# Patient Record
Sex: Male | Born: 1950 | Race: White | Hispanic: No | Marital: Married | Smoking: Former smoker
Health system: Southern US, Community
[De-identification: ages and names within clinical notes are randomized; demographics above are authoritative.]

## PROBLEM LIST (undated history)

## (undated) DIAGNOSIS — M1990 Unspecified osteoarthritis, unspecified site: Secondary | ICD-10-CM

## (undated) DIAGNOSIS — F419 Anxiety disorder, unspecified: Secondary | ICD-10-CM

## (undated) DIAGNOSIS — I4510 Unspecified right bundle-branch block: Secondary | ICD-10-CM

## (undated) DIAGNOSIS — I10 Essential (primary) hypertension: Secondary | ICD-10-CM

## (undated) DIAGNOSIS — S065X9A Traumatic subdural hemorrhage with loss of consciousness of unspecified duration, initial encounter: Secondary | ICD-10-CM

## (undated) DIAGNOSIS — J189 Pneumonia, unspecified organism: Secondary | ICD-10-CM

## (undated) DIAGNOSIS — S065XAA Traumatic subdural hemorrhage with loss of consciousness status unknown, initial encounter: Secondary | ICD-10-CM

## (undated) DIAGNOSIS — D689 Coagulation defect, unspecified: Secondary | ICD-10-CM

## (undated) DIAGNOSIS — I739 Peripheral vascular disease, unspecified: Secondary | ICD-10-CM

## (undated) DIAGNOSIS — G709 Myoneural disorder, unspecified: Secondary | ICD-10-CM

## (undated) DIAGNOSIS — K5790 Diverticulosis of intestine, part unspecified, without perforation or abscess without bleeding: Secondary | ICD-10-CM

## (undated) DIAGNOSIS — E785 Hyperlipidemia, unspecified: Secondary | ICD-10-CM

## (undated) DIAGNOSIS — G4730 Sleep apnea, unspecified: Secondary | ICD-10-CM

## (undated) DIAGNOSIS — K5792 Diverticulitis of intestine, part unspecified, without perforation or abscess without bleeding: Secondary | ICD-10-CM

## (undated) DIAGNOSIS — T7840XA Allergy, unspecified, initial encounter: Secondary | ICD-10-CM

## (undated) DIAGNOSIS — H269 Unspecified cataract: Secondary | ICD-10-CM

## (undated) HISTORY — PX: CATARACT EXTRACTION, BILATERAL: SHX1313

## (undated) HISTORY — DX: Allergy, unspecified, initial encounter: T78.40XA

## (undated) HISTORY — DX: Hyperlipidemia, unspecified: E78.5

## (undated) HISTORY — DX: Anxiety disorder, unspecified: F41.9

## (undated) HISTORY — DX: Unspecified osteoarthritis, unspecified site: M19.90

## (undated) HISTORY — DX: Coagulation defect, unspecified: D68.9

## (undated) HISTORY — PX: CYSTOSCOPY WITH INSERTION OF UROLIFT: SHX6678

## (undated) HISTORY — DX: Diverticulosis of intestine, part unspecified, without perforation or abscess without bleeding: K57.90

## (undated) HISTORY — PX: TONSILLECTOMY: SUR1361

## (undated) HISTORY — PX: DG THUMB LEFT HAND: HXRAD1658

## (undated) HISTORY — DX: Unspecified cataract: H26.9

---

## 1971-02-28 DIAGNOSIS — K759 Inflammatory liver disease, unspecified: Secondary | ICD-10-CM

## 1971-02-28 HISTORY — DX: Inflammatory liver disease, unspecified: K75.9

## 1999-05-06 ENCOUNTER — Ambulatory Visit (HOSPITAL_COMMUNITY): Admission: RE | Admit: 1999-05-06 | Discharge: 1999-05-06 | Payer: Self-pay | Admitting: Endocrinology

## 1999-05-06 ENCOUNTER — Encounter: Payer: Self-pay | Admitting: Endocrinology

## 2001-05-22 ENCOUNTER — Encounter: Payer: Self-pay | Admitting: Emergency Medicine

## 2001-05-22 ENCOUNTER — Emergency Department (HOSPITAL_COMMUNITY): Admission: EM | Admit: 2001-05-22 | Discharge: 2001-05-22 | Payer: Self-pay | Admitting: Emergency Medicine

## 2004-02-28 DIAGNOSIS — I739 Peripheral vascular disease, unspecified: Secondary | ICD-10-CM

## 2004-02-28 DIAGNOSIS — J189 Pneumonia, unspecified organism: Secondary | ICD-10-CM

## 2004-02-28 HISTORY — DX: Pneumonia, unspecified organism: J18.9

## 2004-02-28 HISTORY — DX: Peripheral vascular disease, unspecified: I73.9

## 2004-04-30 ENCOUNTER — Inpatient Hospital Stay (HOSPITAL_COMMUNITY): Admission: EM | Admit: 2004-04-30 | Discharge: 2004-05-05 | Payer: Self-pay | Admitting: Emergency Medicine

## 2004-05-02 ENCOUNTER — Encounter (INDEPENDENT_AMBULATORY_CARE_PROVIDER_SITE_OTHER): Payer: Self-pay | Admitting: *Deleted

## 2004-06-17 ENCOUNTER — Inpatient Hospital Stay (HOSPITAL_COMMUNITY): Admission: EM | Admit: 2004-06-17 | Discharge: 2004-06-18 | Payer: Self-pay | Admitting: Emergency Medicine

## 2005-02-27 DIAGNOSIS — I82409 Acute embolism and thrombosis of unspecified deep veins of unspecified lower extremity: Secondary | ICD-10-CM

## 2005-02-27 HISTORY — DX: Acute embolism and thrombosis of unspecified deep veins of unspecified lower extremity: I82.409

## 2007-09-24 ENCOUNTER — Emergency Department (HOSPITAL_COMMUNITY): Admission: EM | Admit: 2007-09-24 | Discharge: 2007-09-24 | Payer: Self-pay | Admitting: Emergency Medicine

## 2008-02-28 HISTORY — PX: BRAIN SURGERY: SHX531

## 2008-07-25 ENCOUNTER — Inpatient Hospital Stay (HOSPITAL_COMMUNITY): Admission: EM | Admit: 2008-07-25 | Discharge: 2008-07-30 | Payer: Self-pay | Admitting: Emergency Medicine

## 2008-07-25 ENCOUNTER — Ambulatory Visit: Payer: Self-pay | Admitting: Cardiology

## 2008-07-28 ENCOUNTER — Encounter: Payer: Self-pay | Admitting: Cardiovascular Disease

## 2008-07-28 ENCOUNTER — Encounter (INDEPENDENT_AMBULATORY_CARE_PROVIDER_SITE_OTHER): Payer: Self-pay | Admitting: Internal Medicine

## 2008-08-18 ENCOUNTER — Emergency Department (HOSPITAL_COMMUNITY): Admission: EM | Admit: 2008-08-18 | Discharge: 2008-08-19 | Payer: Self-pay | Admitting: Emergency Medicine

## 2009-02-13 ENCOUNTER — Emergency Department (HOSPITAL_COMMUNITY): Admission: EM | Admit: 2009-02-13 | Discharge: 2009-02-14 | Payer: Self-pay | Admitting: Emergency Medicine

## 2009-09-06 ENCOUNTER — Telehealth (INDEPENDENT_AMBULATORY_CARE_PROVIDER_SITE_OTHER): Payer: Self-pay | Admitting: *Deleted

## 2010-03-29 NOTE — Progress Notes (Signed)
  Request Recieved from DDS sent to [facility]  September 06, 2009 11:22 AM

## 2010-05-30 LAB — RAPID URINE DRUG SCREEN, HOSP PERFORMED
Amphetamines: NOT DETECTED
Barbiturates: NOT DETECTED
Benzodiazepines: NOT DETECTED
Cocaine: NOT DETECTED
Opiates: POSITIVE — AB
Tetrahydrocannabinol: NOT DETECTED

## 2010-05-30 LAB — URINALYSIS, ROUTINE W REFLEX MICROSCOPIC
Bilirubin Urine: NEGATIVE
Glucose, UA: NEGATIVE mg/dL
Hgb urine dipstick: NEGATIVE
Ketones, ur: NEGATIVE mg/dL
Nitrite: NEGATIVE
Protein, ur: NEGATIVE mg/dL
Specific Gravity, Urine: 1.015 (ref 1.005–1.030)
Urobilinogen, UA: 1 mg/dL (ref 0.0–1.0)
pH: 6 (ref 5.0–8.0)

## 2010-05-30 LAB — GLUCOSE, RANDOM: Glucose, Bld: 254 mg/dL — ABNORMAL HIGH (ref 70–99)

## 2010-06-06 LAB — DIFFERENTIAL
Eosinophils Relative: 0 % (ref 0–5)
Lymphocytes Relative: 25 % (ref 12–46)
Neutro Abs: 7.2 10*3/uL (ref 1.7–7.7)
Neutrophils Relative %: 67 % (ref 43–77)

## 2010-06-06 LAB — POCT CARDIAC MARKERS
Myoglobin, poc: 44.7 ng/mL (ref 12–200)
Troponin i, poc: <0.05 ng/mL (ref 0.00–0.09)
Troponin i, poc: <0.05 ng/mL (ref 0.00–0.09)

## 2010-06-06 LAB — GLUCOSE, CAPILLARY
Glucose-Capillary: 199 mg/dL — ABNORMAL HIGH (ref 70–99)
Glucose-Capillary: 157 mg/dL — ABNORMAL HIGH (ref 70–99)
Glucose-Capillary: 169 mg/dL — ABNORMAL HIGH (ref 70–99)
Glucose-Capillary: 172 mg/dL — ABNORMAL HIGH (ref 70–99)
Glucose-Capillary: 175 mg/dL — ABNORMAL HIGH (ref 70–99)
Glucose-Capillary: 180 mg/dL — ABNORMAL HIGH (ref 70–99)
Glucose-Capillary: 77 mg/dL (ref 70–99)

## 2010-06-06 LAB — CBC
Hemoglobin: 15.2 g/dL (ref 13.0–17.0)
MCHC: 33.8 g/dL (ref 30.0–36.0)
MCV: 83.5 fL (ref 78.0–100.0)
MCV: 83.1 fL (ref 78.0–100.0)
RBC: 5.31 MIL/uL (ref 4.22–5.81)
RBC: 5.4 MIL/uL (ref 4.22–5.81)
RDW: 15.4 % (ref 11.5–15.5)
RDW: 14.5 % (ref 11.5–15.5)
WBC: 10.7 10*3/uL — ABNORMAL HIGH (ref 4.0–10.5)
WBC: 9.4 10*3/uL (ref 4.0–10.5)

## 2010-06-06 LAB — BRAIN NATRIURETIC PEPTIDE: Pro B Natriuretic peptide (BNP): <30 pg/mL (ref 0.0–100.0)

## 2010-06-06 LAB — BASIC METABOLIC PANEL
BUN: 12 mg/dL (ref 6–23)
Chloride: 99 mEq/L (ref 96–112)
Creatinine, Ser: 0.89 mg/dL (ref 0.4–1.5)
GFR calc Af Amer: >60 mL/min (ref >60)
GFR calc non Af Amer: >60 mL/min (ref >60)
Glucose, Bld: 114 mg/dL — ABNORMAL HIGH (ref 70–99)
Sodium: 140 mEq/L (ref 135–145)

## 2010-06-06 LAB — APTT: aPTT: 30 seconds (ref 24–37)

## 2010-06-06 LAB — D-DIMER, QUANTITATIVE: D-Dimer, Quant: 0.46 ug/mL-FEU (ref 0.00–0.48)

## 2010-06-06 LAB — POCT I-STAT, CHEM 8
Glucose, Bld: 208 mg/dL — ABNORMAL HIGH (ref 70–99)
HCT: 46 % (ref 39.0–52.0)
Sodium: 138 mEq/L (ref 135–145)

## 2010-06-06 LAB — CK TOTAL AND CKMB (NOT AT ARMC)
CK, MB: 1.3 ng/mL (ref 0.3–4.0)
Relative Index: INVALID (ref 0.0–2.5)
Total CK: 95 U/L (ref 7–232)

## 2010-06-06 LAB — PROTIME-INR
INR: 1 (ref 0.00–1.49)
Prothrombin Time: 13.7 seconds (ref 11.6–15.2)

## 2010-06-07 LAB — DIFFERENTIAL
Basophils Absolute: 0.1 10*3/uL (ref 0.0–0.1)
Basophils Relative: 1 % (ref 0–1)
Eosinophils Absolute: 0 10*3/uL (ref 0.0–0.7)
Eosinophils Relative: 0 % (ref 0–5)
Monocytes Absolute: 0.8 10*3/uL (ref 0.1–1.0)
Monocytes Relative: 9 % (ref 3–12)

## 2010-06-07 LAB — URINALYSIS, ROUTINE W REFLEX MICROSCOPIC
Bilirubin Urine: NEGATIVE
Glucose, UA: NEGATIVE mg/dL
Hgb urine dipstick: NEGATIVE
Ketones, ur: NEGATIVE mg/dL
Nitrite: NEGATIVE
Protein, ur: NEGATIVE mg/dL
Protein, ur: NEGATIVE mg/dL
Specific Gravity, Urine: 1.015 (ref 1.005–1.030)
Urobilinogen, UA: 0.2 mg/dL (ref 0.0–1.0)
Urobilinogen, UA: 0.2 mg/dL (ref 0.0–1.0)

## 2010-06-07 LAB — BASIC METABOLIC PANEL
BUN: 9 mg/dL (ref 6–23)
CO2: 28 mEq/L (ref 19–32)
Calcium: 9.3 mg/dL (ref 8.4–10.5)
Chloride: 100 mEq/L (ref 96–112)
GFR calc Af Amer: >60 mL/min (ref >60)
GFR calc non Af Amer: >60 mL/min (ref >60)
GFR calc non Af Amer: >60 mL/min (ref >60)
Glucose, Bld: 121 mg/dL — ABNORMAL HIGH (ref 70–99)
Potassium: 3.8 mEq/L (ref 3.5–5.1)
Potassium: 4 mEq/L (ref 3.5–5.1)
Sodium: 138 mEq/L (ref 135–145)
Sodium: 140 mEq/L (ref 135–145)

## 2010-06-07 LAB — CBC
HCT: 44.1 % (ref 39.0–52.0)
HCT: 44.9 % (ref 39.0–52.0)
Hemoglobin: 15 g/dL (ref 13.0–17.0)
Hemoglobin: 15.1 g/dL (ref 13.0–17.0)
Hemoglobin: 15.1 g/dL (ref 13.0–17.0)
Platelets: 294 10*3/uL (ref 150–400)
RBC: 5.38 MIL/uL (ref 4.22–5.81)
RDW: 14.5 % (ref 11.5–15.5)
RDW: 15 % (ref 11.5–15.5)
WBC: 12.2 10*3/uL — ABNORMAL HIGH (ref 4.0–10.5)
WBC: 9.1 10*3/uL (ref 4.0–10.5)

## 2010-06-07 LAB — URINE CULTURE: Culture: NO GROWTH

## 2010-06-07 LAB — GLUCOSE, CAPILLARY
Glucose-Capillary: 100 mg/dL — ABNORMAL HIGH (ref 70–99)
Glucose-Capillary: 130 mg/dL — ABNORMAL HIGH (ref 70–99)
Glucose-Capillary: 130 mg/dL — ABNORMAL HIGH (ref 70–99)
Glucose-Capillary: 150 mg/dL — ABNORMAL HIGH (ref 70–99)
Glucose-Capillary: 171 mg/dL — ABNORMAL HIGH (ref 70–99)
Glucose-Capillary: 176 mg/dL — ABNORMAL HIGH (ref 70–99)
Glucose-Capillary: 192 mg/dL — ABNORMAL HIGH (ref 70–99)
Glucose-Capillary: 197 mg/dL — ABNORMAL HIGH (ref 70–99)

## 2010-06-07 LAB — LIPID PANEL
HDL: 25 mg/dL — ABNORMAL LOW (ref >39)
LDL Cholesterol: 111 mg/dL — ABNORMAL HIGH (ref 0–99)
Total CHOL/HDL Ratio: 6.6 RATIO
Triglycerides: 142 mg/dL (ref <150)
VLDL: 28 mg/dL (ref 0–40)

## 2010-06-07 LAB — COMPREHENSIVE METABOLIC PANEL
ALT: 48 U/L (ref 0–53)
Albumin: 3.6 g/dL (ref 3.5–5.2)
Alkaline Phosphatase: 52 U/L (ref 39–117)
Chloride: 104 mEq/L (ref 96–112)
Potassium: 4.5 mEq/L (ref 3.5–5.1)
Sodium: 138 mEq/L (ref 135–145)
Total Bilirubin: 0.7 mg/dL (ref 0.3–1.2)
Total Protein: 6.9 g/dL (ref 6.0–8.3)

## 2010-06-07 LAB — HEMOGLOBIN A1C
Hgb A1c MFr Bld: 6.3 % — ABNORMAL HIGH (ref 4.6–6.1)
Mean Plasma Glucose: 134 mg/dL

## 2010-06-07 LAB — POCT CARDIAC MARKERS
CKMB, poc: 1 ng/mL (ref 1.0–8.0)
CKMB, poc: <1 ng/mL — ABNORMAL LOW (ref 1.0–8.0)
Myoglobin, poc: 71 ng/mL (ref 12–200)
Myoglobin, poc: 86.1 ng/mL (ref 12–200)
Troponin i, poc: <0.05 ng/mL (ref 0.00–0.09)
Troponin i, poc: <0.05 ng/mL (ref 0.00–0.09)

## 2010-06-07 LAB — CARDIAC PANEL(CRET KIN+CKTOT+MB+TROPI)
Relative Index: INVALID (ref 0.0–2.5)
Total CK: 93 U/L (ref 7–232)

## 2010-06-07 LAB — CK TOTAL AND CKMB (NOT AT ARMC)
CK, MB: 1.7 ng/mL (ref 0.3–4.0)
CK, MB: 1.8 ng/mL (ref 0.3–4.0)
Relative Index: 1.5 (ref 0.0–2.5)
Relative Index: 1.5 (ref 0.0–2.5)
Total CK: 104 U/L (ref 7–232)
Total CK: 111 U/L (ref 7–232)

## 2010-06-07 LAB — TSH
TSH: 1.518 u[IU]/mL (ref 0.350–4.500)
TSH: 1.929 u[IU]/mL (ref 0.350–4.500)

## 2010-06-07 LAB — TROPONIN I: Troponin I: 0.02 ng/mL (ref 0.00–0.06)

## 2010-07-12 NOTE — Consult Note (Signed)
NAME:  [name], [name] NO.:  [id number]   MEDICAL RECORD NO.:  [id number]          PATIENT TYPE:  INP   LOCATION:  2906                         FACILITY:  [facility]   PHYSICIAN:  Gerrit Friends. Dietrich Pates, MD, FACCDATE OF BIRTH:  [birth year]   DATE OF CONSULTATION:  07/26/2008  DATE OF DISCHARGE:                                 CONSULTATION   HISTORY OF PRESENT ILLNESS:  A 60 year old gentleman with multiple  cardiovascular risk factors presented to hospital with chest discomfort  with ischemic quality.  [name] has had long-standing hypertension and  diabetes.  The latter has recently improved with substantial weight  loss.  The former has been kept in good control.  He has had recent  illnesses including hematuria thought to represent prostatitis or  cystitis and treated with a course of antibiotics and left lower  quadrant pain thought to represent diverticulitis, and treated with a  second course of antibiotics.  He had taken the latter medications for 1  week when he noted the sudden onset of sharp moderate left subscapular  pain that eventually became moderate anterior chest pressure.  There was  associated dyspnea, but no diaphoresis nor nausea.  He had paresthesias  in both arms.  He developed mild lightheadedness as well.  He has no  history of anxiety nor of hyperventilation.  He has had some perhaps  similar symptoms, but nothing is impressive.  He phoned a Nurse Line in  the [place] system and was advised to seek care in the emergency department  and to be transported by ambulance.  His symptoms may have been present  for an hour or two prior to presenting to the ED at [facility].  He was  given sublingual nitroglycerin with substantial improvement in his  symptoms.  He had some waxing and waning discomfort over the subsequent  24-36 hours, but has been well today.  Initial EKG was absolutely  normal.  Cardiac markers have been normal.  A CT scan of the chest was  unremarkable.  These images were reviewed with the radiologist.  He has  mild coronary calcification and very mild calcification of the thoracic  aorta.   PAST MEDICAL HISTORY:  Otherwise notable for a prior episode of DVT.  He  has been overweight for some time, but not to the extent it was in the  past.  He has a 40 pack-year history of cigarette smoking that was  discontinued 6 months ago.  He underwent repair of a ligament in 2002.  He has had a number of serious episodes of apparent allergy with  swelling of the hands and face, some airway compromise and pruritus.  Specific allergen has not been identified, although the patient suspects  that it could be peanuts.  He is aware of allergies to Vicodin,  simvastatin, atorvastatin and apparently other statins.   SOCIAL HISTORY:  Prior excessive use of alcohol, none for quite a few  years.  Married and lives locally; 3 children are alive and well.   FAMILY HISTORY:  Father died after CVA with a history of coronary  disease.  Mother had hypertension.   REVIEW OF SYSTEMS:  Notable for he has intermittent constipation.  He  experienced symptoms compatible with GERD many years ago, when he was  using alcohol to excess and was obese.  He has not had that symptoms in  years.  He follows a regular diet.  He requires corrective lenses for  near and far vision.  He has a history of depression.  All other systems  reviewed and are negative.   PHYSICAL EXAMINATION:  GENERAL:  Pleasant gentleman in no acute  distress.  VITAL SIGNS:  The heart rate is 90 and regular, blood pressure 120/60,  respirations 14 and unlabored, and temperature 98.7.  HEENT:  Anicteric  sclerae; normal lids, and conjunctivae; normal EOMs.  NECK:  No jugular venous distention; normal carotid upstrokes without  bruits.  ENDOCRINE:  No thyromegaly.  HEMATOPOIETIC:  No adenopathy.  LUNGS:  Clear.  CARDIAC:  Normal first and second heart sounds; modest basilar systolic   ejection murmur.  ABDOMEN:  Soft and nontender; normal bowel sounds; no organomegaly.  EXTREMITIES:  Normal posterior tibial pulses; no edema.  NEUROLOGIC:  Symmetric strength and tone; normal cranial nerves.  SKIN:  Some loss of hair over the lower extremities; no significant  lesions.  PSYCHIATRIC:  Alert and oriented; normal affect.  NEUROLOGIC:  Symmetric strength and tone; normal cranial nerves.   EKG:  Normal sinus rhythm; within normal limits.   CHEST X-RAY:  Unremarkable.  Lipid profile shows a total cholesterol of  164, triglycerides of 142, HDL 25, and LDL of 111.  Metabolic profile,  TSH, and CBC are normal.  Hemoglobin A1c is 6.3.  Cardiac markers have  been negative.  BNP level was normal.  D-dimer was 0.51.   IMPRESSION:  [name] has multiple cardiovascular risk factors and an  impressive presentation in terms of the nature of his symptoms, but  negative course including normal cardiac markers and normal EKGs.  He  has some coronary calcification, but this is probably less than would be  expected for a 60 year old diabetic with hypertension and a history of  tobacco use.  The likelihood that his current symptoms represent an  acute coronary syndrome is well less than 50%.  We will proceed with a  stress nuclear study in the morning.  Ideally, he should be treated with  statins, but apparently has had adverse reactions to these medications.  We will try Niaspan.  We appreciate the request for consultation and  will be happy to follow this nice gentleman with you.      Gerrit Friends. Dietrich Pates, MD, [facility]  Electronically Signed     RMR/MEDQ  D:  07/26/2008  T:  07/27/2008  Job:  [id number]

## 2010-07-12 NOTE — Discharge Summary (Signed)
NAME:  [name], [name] NO.:  [id number]   MEDICAL RECORD NO.:  [id number]          PATIENT TYPE:  INP   LOCATION:  3739                         FACILITY:  [facility]   PHYSICIAN:  Ruthy Dick, MD    DATE OF BIRTH:  [birth year]   DATE OF ADMISSION:  07/24/2008  DATE OF DISCHARGE:  07/30/2008                               DISCHARGE SUMMARY   REASON FOR ADMISSION:  Chest pain.   FINAL DISCHARGE DIAGNOSES:  1. Atypical chest pain.  2. Nonobstructive coronary artery disease.  3. Type 2 diabetes mellitus.  4. Hypertension.  5. Dyslipidemia.  6. Morbid obesity.  7. Prior history of tobacco abuse.  8. Leukocytosis, resolved.  9. History of deep venous thrombosis.  10.Benign prostatic hyperplasia.   CONSULT DURING THIS ADMISSION:  Cardiology consult.   PROCEDURES DONE DURING THIS ADMISSION:  1. A 2-D echocardiogram which was read as having 55% to 65% ejection      fraction with no wall motion abnormalities.  2. Stress Cardiolite study which was read as having a small inferior      wall infarct from his chest base and normal ejection fraction of      61%.  3. CT angiogram of the chest which showed no evidence of pulmonary      embolism and no active chest disease.  4. Left heart catheterization because of abnormal stress test.  This      was read as having nonobstructive coronary artery disease with      normal ejection fraction and the recommendation was for the patient      have risk factor modifications and started on standard medications.   BRIEF HISTORY OF PRESENT ILLNESS AND HOSPITAL COURSE:  This is a 60-year-  old morbidly obese Caucasian male with a past medical history is  significant for hypertension, diabetes mellitus who came into the  hospital with chest tightness.  Because of his numerous risk factors, he  was admitted and ruled out for acute coronary artery syndrome with  serial enzymes and EKG.  A stress Cardiolite study, however, showed that  the  patient probably has a small inferior wall infarction.  Because of  this, a left heart catheterization was ordered and the results as noted  above, did not show any new occlusive coronary disease.  The patient did  have 40% occlusion in some of his vessels, but again no occlusive  coronary artery disease.  Because of this, the cardiologist, Dr.  Clifton James recommended that the patient should be on aggressive risk  factor modifications including ACE inhibitor, statin, aspirin, and beta-  blocker.  He is to continue quitting tobacco abuse.  Of note, it is the  fact that the patient has had intolerance to statin and side effects to  statin as well.  He has also had some intolerance to ACE inhibitors.  I  discussed this at length with the patient and Dr. Clifton James also  discussed this at length with him and according to him, he was willing  to try this two groups of medications again and if there has been any  side effects again, we will give him advise to stop taking them  altogether, again this is a trial on his part and he is willing to try  since there are benefits for him to be on these medications considering  his situation.  We planned to start on low-dose of this two medications.  Today, he has no complaints whatsoever.  He says his chest pain has  resolved.  No abdominal pain, no nausea, no vomiting, no diarrhea, no  constipation, no dysuria, no frequency, no syncope.  He is agitating to  go home today.   PHYSICAL EXAMINATION:  VITAL SIGNS:  Temperature 97.5, pulse 78,  respiration 22, blood pressure 135/70 and saturating 98% on room air.  CHEST:  Clear to auscultation bilaterally.  ABDOMEN:  Soft, nontender.  EXTREMITIES: No clubbing, no cyanosis, no edema.  CARDIOVASCULAR:  First and second heart sounds only.  CENTRAL NERVOUS SYSTEM:  Nonfocal.   The patient is to follow up with Dr. Juleen China who is actually his  endocrinologist, but also sees to primary care physician according to   this patient.  He is to see Dr. Juleen China in about 2 weeks.  He is also to  follow up with Dr. Clifton James in about 3 weeks and Dr. Gibson Ramp phone  number has been provided to the patient to call.  He is to call Dr.  Juleen China also for his appointment.   DISCHARGE MEDICATIONS:  1. Lantus 40 units subcu daily.  2. NovoLog insulin 10 units subcu b.i.d.  3. Terazosin 10 mg subcu daily.  4. Ambien 10 mg at bedtime p.r.n. for insomnia.  5. Aspirin 325 mg p.o. daily.  6. Over-the-counter vitamin D3 one tablet daily.  7. Fish oil 1000 mg subcu daily.  8. Men's Health multivitamins one tablet daily.  9. Metoprolol 25 mg p.o. b.i.d.  10.Crestor 10 mg p.o. daily.  11.Lisinopril 5 mg p.o. daily.  The patient is to stop taking both      Crestor and lisinopril if the patient develops side effects and      this side effects has been explained to the patient in details and      he knows them very well.  He has been on this medications in the      past.   Time spent for discharge planning is greater than 30 minutes.      Ruthy Dick, MD  Electronically Signed     GU/MEDQ  D:  07/30/2008  T:  07/31/2008  Job:  [id number]   cc:   Brooke Bonito, M.D.  Verne Carrow, MD

## 2010-07-12 NOTE — H&P (Signed)
NAME:  [name], [name] NO.:  [id number]   MEDICAL RECORD NO.:  [id number]          PATIENT TYPE:  INP   LOCATION:  1826                         FACILITY:  [facility]   PHYSICIAN:  Della Goo, M.D. DATE OF BIRTH:  1950-07-15   DATE OF ADMISSION:  07/24/2008  DATE OF DISCHARGE:                              HISTORY & PHYSICAL   PRIMARY CARE PHYSICIAN:  Unassigned.   ENDOCRINOLOGIST:  Brooke Bonito, M.D.   CHIEF COMPLAINT:  Chest pain.   HISTORY OF PRESENT ILLNESS:  This is a 61 year old male who presented to  the emergency department via ambulance secondary to chest pain,  shortness of breath and chest tightness that started at about 3 p.m..  He stated the pain was radiating into the left chest and into both arms.  He describes the discomfort as being a heaviness and numbness.  He  states that both arms became tingly and he felt lightheaded and short of  breath.  He denies having any diaphoresis.  He states that he was at  work when the discomfort occurred and he reports leaving work to go  home.  He felt that his blood sugar may have been high, so he checked  his blood sugar and found it to be 157.  He stated that he then thought  he may have been having an allergic reaction to the antibiotics that he  has been taking for the past week for diverticulitis.  He is taking both  Flagyl and Bactrim therapy.  He stated at home he then called the [facility] in [place] Help Nurse, who advised him to go to the  hospital for evaluation and so he went to the Urgent Care Center.  At  the Urgent Care Center, he was seen and taken to the emergency  department for urgent workup.  The patient states that at home he had  taken 2 aspirin.  He was administered 2 sublingual nitroglycerin  initially which relieved the pain slightly but the pain returned again  at 9 p.m.  The patient was then placed on IV nitroglycerin drip.   PAST MEDICAL HISTORY:  1. Hypertension.  2.  Diabetes.  3. Recent diverticulitis.  4. History of previous deep venous thrombosis.  5. Morbid obesity.  6. Tobacco abuse history.   PAST SURGICAL HISTORY:  History of a left ligament repair in 2002.   MEDICATIONS:  Include Benadryl Cipro, Bactrim.  The patient was on  Hyzaar but he stopped taking this medication.  He is on Lantus insulin  40 units subcu q.h.s. and NovoLog insulin 10 units twice a day with  meals.   ALLERGIES:  VICODIN and the patient also reports having an intolerance  to ZOCOR, LIPITOR and STATIN medications which cause diaphragmatic  discomfort, muscle pain, and urinary incontinence.   SOCIAL HISTORY:  The patient is a previous smoker, he reports quitting 6  months ago.  He is a nondrinker and no history of illicit drug usage.   FAMILY HISTORY:  Positive for coronary artery disease in his father who  died of a cerebrovascular accident.  Positive for hypertension in his  mother.  No history of diabetes in the family other than him and no  history of cancer in the family that he knows of.   REVIEW OF SYSTEMS:  Pertinents are mentioned above.  The patient denies  having any nausea, vomiting, diarrhea, constipation, dysuria, syncope,  seizures, fevers, chills, weight loss, arthralgias or myalgias.   PHYSICAL EXAMINATION:  FINDINGS:  This is a 60 year old morbidly obese  male in no visible discomfort or acute distress, currently on an IV  nitroglycerin drip at this time.  VITAL SIGNS:  Temperature 98.0, blood pressure now 118/78, heart rate  85, respirations 18, O2 sat is 99% on 2 liters nasal cannula oxygen.  HEENT: Examination normocephalic, atraumatic.  There is no scleral  icterus.  Pupils equally round, reactive to light.  Extraocular  movements are intact.  Funduscopic benign.  There is no scleral icterus.  Nares are patent bilaterally.  Tympanic membranes clear bilaterally.  Oropharynx is clear.  NECK:  Supple full range of motion.  No thyromegaly,  adenopathy, jugular  venous distention.  CARDIOVASCULAR: Regular rate and rhythm.  No murmurs, gallops or rubs.  LUNGS: Clear to auscultation bilaterally.  No rales, rhonchi or wheezes.  ABDOMEN:  Positive bowel sounds, soft, nontender, nondistended.  EXTREMITIES: Without cyanosis, clubbing or edema.  NEUROLOGIC EXAMINATION:  The patient is alert and oriented x3.  Cranial  nerves are intact.  Motor and sensory function also intact.   LABORATORY STUDIES:  White blood cell count 9.1, hemoglobin 15.0,  hematocrit 44.1, MCV 83.0, platelets 294, neutrophils 62% lymphocytes  29%.  Sodium 138, potassium 4.5, chloride 104, carbon dioxide 30, BUN 9,  creatinine 0.80, glucose 109, albumin 3.6, AST 29, ALT 48.  Urinalysis  negative.  Pro time 14.4, INR 1.1, PTT 29.  Point of care cardiac  markers with a myoglobin of 71.0, CK-MB less than 1.0, troponin less  than 0.05.  Beta natriuretic peptide less than 30.0.  Cardiac enzymes  total CK 132, CK-MB 1.8, relative index 1.4, troponin 0.01.  Chest x-ray  reveals no acute cardiopulmonary process and chronic bronchitic  markings.  EKG reveals normal sinus rhythm without acute ST-segment  changes.   ASSESSMENT:  A 60 year old male being admitted with:  1. Chest pain.  2. Type 2 diabetes mellitus.  3. Hypertension.  4. Hyperlipidemia.  5. Morbid obesity.  6. Previous tobacco history.   PLAN:  The patient will be admitted to the step-down ICU area and will  remain on the IV nitroglycerin drip.  The patient has been administered  aspirin therapy, oxygen therapy and will be placed on DVT and GI  prophylaxis.  His  regular medications will be further verified and sliding scale insulin  coverage will be ordered for elevated blood sugars.  Further cardiac  evaluation will be rendered pending the patient's clinical course and  results of his clinical studies.      Della Goo, M.D.  Electronically Signed     HJ/MEDQ  D:  07/25/2008  T:   07/25/2008  Job:  [id number]

## 2010-07-12 NOTE — Cardiovascular Report (Signed)
NAME:  [name], [name] NO.:  [id number]   MEDICAL RECORD NO.:  [id number]           PATIENT TYPE:   LOCATION:                                 FACILITY:   PHYSICIAN:  Verne Carrow, MDDATE OF BIRTH:  01-28-1951   DATE OF PROCEDURE:  07/29/2008  DATE OF DISCHARGE:                            CARDIAC CATHETERIZATION   PROCEDURES PERFORMED:  1. Left heart catheterization.  2. Selective coronary angiography.  3. Left ventricular angiogram.   OPERATOR:  Verne Carrow, MD   INDICATION:  Chest pain in a 60 year old patient with history of  hypertension and diabetes mellitus.  The patient underwent an exercise  myocardial perfusion study that showed inferior wall infarction but no  active ischemia.   DETAILS OF THE PROCEDURE:  The patient was brought to the inpatient  cardiac catheterization laboratory after signing informed consent for  the procedure.  The right groin was prepped and draped in a sterile  fashion.  A 1% lidocaine was used for local anesthesia.  A 5-French  sheath was inserted into the right femoral artery without difficulty.  Standard diagnostic catheters were used to perform selective coronary  angiography.  A pigtail catheter was used to perform a left ventricular  angiogram.  There was no significant gradient noted across the aortic  valve on pullback of the catheter.  The patient tolerated the procedure  well and was taken to the holding area in stable condition.   HEMODYNAMIC FINDINGS:  Central aortic pressure 146/77.  Left ventricular  pressure 131/11.  Left ventricular end-diastolic pressure 13.   ANGIOGRAPHIC FINDINGS:  1. The left main coronary artery had no obstructive disease.  2. The left anterior descending is a large vessel that courses to the      apex and gives off a moderate to large-sized diagonal branch.  The      midportion of the LAD has a long tubular 30% stenosis.  The distal      portion of the LAD has mild  plaque disease.  There are no flow-      limiting lesions within the left anterior descending artery.  The      diagonal branch is a moderate to large-sized vessel that has a 40%      stenosis.  3. The circumflex artery gives off 2 small to moderate-sized obtuse      marginal branches.  There appears to be a 30% stenosis in the      midportion of the circumflex but no severely obstructive lesions.  4. The right coronary artery is a large dominant vessel that has mild      luminal irregularities only in the mid and distal vessel.  The      posterior descending artery is a small-caliber vessel that has a      40% stenosis.  5. Left ventricular angiogram shows normal left ventricular systolic      function with no wall motion abnormalities.  Ejection fraction was      estimated at 50-55%.   IMPRESSION:  1. Nonobstructive coronary artery disease.  2. Normal left ventricular systolic function.  3. Noncardiac etiology of chest pain.   RECOMMENDATIONS:  I recommend an aggressive risk factor modification in  this patient who is found to have mild nonobstructive coronary artery  disease.  I would suggest adding a statin medication, but the patient  has been intolerant to STATIN medications in the past secondary to  muscle pain and diaphragmatic discomfort.  I agree with the addition of  a beta-blocker.  The patient is in agreement that he should continue  with his complete tobacco cessation.  He can be discharged today after 3  hours of bedrest if he clinically does well.  This will be around 5:30  p.m.  He can follow up in my office in 2 weeks.  The number for  appointment has been included in the paper medical record.      Verne Carrow, MD  Electronically Signed     CM/MEDQ  D:  07/29/2008  T:  07/30/2008  Job:  [id number]

## 2010-07-15 NOTE — H&P (Signed)
NAME:  [name], [name]               ACCOUNT NO.:  [id number]   MEDICAL RECORD NO.:  [id number]          PATIENT TYPE:  INP   LOCATION:  0101                         FACILITY:  [facility]   PHYSICIAN:  Lonia Blood, M.D.      DATE OF BIRTH:  05-09-1950   DATE OF ADMISSION:  06/17/2004  DATE OF DISCHARGE:                                HISTORY & PHYSICAL   PRIMARY CARE PHYSICIAN:  Dr. Juleen China.   PRESENTING COMPLAINT:  Left lower extremity swelling and pain.   HISTORY OF PRESENT ILLNESS:  This is a 60 year old white male with history  of diabetes type 2 and recent pneumonia that was recently seen at an outside  Hospital and diagnosed with left lower extremity DVT. The patient was sent  home on Lovenox and warfarin. He went in today to see Dr. Juleen China, his primary  care physician, who was not comfortable with the way the patient was  presenting. The patient complained of some chest pain, apparently even on  the day he was diagnosed with the DVT. Hence, he is being sent here for  further evaluation. The patient currently denied any chest pain but has  noticed increasing swelling in his left lower extremity. He is also worried  about using the Lovenox at home with the Coumadin. He denied any fever,  cough, shortness of breath.   PAST MEDICAL HISTORY:  1.  Diabetes type 2.  2.  Hypertension, currently controlled off medications.  3.  Morbid obesity.  4.  Recent pneumonia.   MEDICATIONS:  Mainly insulin at this time - Lantus 50 units daily and  sliding scale insulin with NovoLog. The patient quit taking all other  medications. Also takes Vicodin for pain and warfarin since 2 days ago.   SOCIAL HISTORY:  The patient is married, lives here in [place], works for  [employer]. 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 He has remote history of tobacco smoking which he quit about 15 years  ago, currently smokes cigar on and off. Denied any drinking use or IV drug  use.   FAMILY HISTORY:  The patient denied any family history of vascular occlusive  disease. Positive for family history of diabetes.   REVIEW OF SYSTEMS:  A 10-point review of systems essentially negative except  for the HPI.   PHYSICAL EXAMINATION:  VITAL SIGNS:  Temperature is 99.5, blood pressure  136/82, pulse 95, respiratory rate 24. His saturation 97% on room air.  GENERAL:  The patient is morbidly obese but alert, oriented, and very  conversant.  HEENT:  PERRL, EOMI. His neck is supple. No JVD, no lymphadenopathy.  RESPIRATORY:  The patient has good air entry bilaterally. No wheezes or  rales.  CARDIOVASCULAR:  The patient has regular rate and rhythm.  ABDOMEN:  Obese, full, nontender, with positive bowel sounds.  EXTREMITIES:  Showed left lower extremity tight, swollen. Warm at the calf  but cold at the extremities. Bluish discoloration mildly of the left foot.   LABORATORY DATA:  White count is 9.3, hemoglobin 12.8, with an MCV of 79.3,  platelet count  529, with normal differentials. Sodium 132, potassium 4.1,  chloride 98, glucose 219, BUN 6, creatinine 0.8, calcium 9.8, total protein  7.4, albumin 2.9. LFTs normal. His PT is 13.1, INR 1.0, PTT 37.   Chest CT without contrast shows no evidence of PE but a 5 mm nodule in the  right upper lung, probably not new per patient, and recommended follow-up in  about 3 months.   ASSESSMENT:  This is a 60 year old obese gentleman presenting with left  lower extremity deep venous thrombosis but no evidence of pulmonary embolus.  The patient has failed literally home Lovenox therapy and is not  comfortable. His DVT seems to be probably brought on by recent prolonged  immobilization from hospitalization. Per the patient, however, he has since  resumed work which is very active. 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 The patient therefore may have had  some genetic causes as well. With acute  thromboembolic phenomenon, however,  will defer testing for genetic causes until the patient is fully treated.  Probably after 6 months will stop the Coumadin and check all the panels 2  weeks later. For now, however, the plan will be:  1.  Will admit the patient to a monitored bed.  2.  Start both Lovenox and Coumadin while the patient is in the hospital.      Elevate his foot and add warm compress.  3.  Continue to mange his diabetes with Lantus and sliding scale insulin and      make further adjustment as needed. The patient's previous hemoglobin A1c      was 11.8 in March so that indicated poor control.  4.  For his morbid obesity, the patient will be clearly counseled.  5.  History of hypertension. The patient has been off medications on his own      for a while. Will therefore monitor his blood pressure. If it goes up      will restart him on some antihypertensives.   Will endeavor to see the patient's INR climb steadily prior to discharge and  the target INR will be 2-3.      LG/MEDQ  D:  06/17/2004  T:  06/17/2004  Job:  [id number]

## 2010-07-15 NOTE — H&P (Signed)
NAME:  [name], [name]               ACCOUNT NO.:  [id number]   MEDICAL RECORD NO.:  [id number]          PATIENT TYPE:  INP   LOCATION:  0344                         FACILITY:  [facility]   PHYSICIAN:  Mobolaji B. Bakare, M.D.DATE OF BIRTH:  [birth year]   DATE OF ADMISSION:  04/30/2004  DATE OF DISCHARGE:                                HISTORY & PHYSICAL   PRIMARY CARE PHYSICIAN:  [name]   CHIEF COMPLAINT:  Shortness of breath for 10 days.   HISTORY OF PRESENT ILLNESS:  [name] is 60 year old Caucasian male with  history of diabetes mellitus, hypertension, hyperlipidemia. He was well  until about 10 days ago when he started experiencing shortness of breath at  rest and also on exertion. He started experiencing pleuritic chest pain  about 5 days ago. He denies cough. He had low grade fever of 99.7 at home.  As stated, he had some chills yesterday, which lasted about 1 hour. He did  not check his temperature at that time. He was seen in [name] office  last Wednesday, 3 days ago, and the patient was started on antibiotics,  Levaquin for pneumonia. However, he is not getting any better and he came to  the emergency department. Chest x-ray done was read as unilateral pulmonary  congestion, left sided. His BNP was less than 100. The patient does not have  orthopnea or PND, no lower extremity edema. He does have muscle aches and  headaches. He stated that his eldest daughter was also sick with muscle  aches and headaches.   REVIEW OF SYSTEMS:  He feels nauseous but no vomiting. No change in his  bowel habits. No dysuria. He has lost 20 pounds in the last 3 months  intentionally because he was on a vegetarian diet.   PAST MEDICAL HISTORY:  1.  Diabetes mellitus type 2, for which he is using oral hypoglycemic agent.      He stated that [name] is planning to put him on insulin.  2.  Hypertension. He was using Hyzaar before. However, he discontinued using      this because he  thought it was giving him leg cramps. But he later      realized that it could have been the Zocor, which he was using about the      same time.  3.  Hyperlipidemia. The patient has side effects from statins and currently      is not using any medication.   CURRENT MEDICATIONS:  1.  Glucophage XR 500 mg 4 tablets q.h.s.  2.  Amaryl 4 mg 1 tablet q.h.s.  3.  Recently started on Levaquin.   ALLERGIES:  The patient has side effects to ZOCOR.   SOCIAL HISTORY:  He smokes cigarettes 5 sticks per day and also smokes  cigars. He has used cocaine and weed in the past when he was much younger.  He occasionally drinks alcohol.   FAMILY HISTORY:  He is married, has 3 children. Father died at the age of 33  from CVA.   Initial vitals:  Blood pressure 185/104, later rechecked 151/35.  Temperature  98.8, pulse 110, respiratory rate of 22, O2 sat of 96% on 2 L.  On examination, the patient is not dyspneic, but slightly tachypneic.  Normocephalic, atraumatic head. Not pale, anicteric, no cervical  lymphadenopathy. No elevated JVD. Mucous membranes moist, no oral thrush, no  exudate. Lungs with clear breath sounds, no rhonchi, no wheeze. Rales on  left lung base. Chest wall, no remarkable tenderness on anterior chest wall.  Abdomen obese, soft, not tender, bowel sounds present, no holosystolic  murmur heard. Extremities, no pedal edema, no calf tenderness. Dorsalis  pedis pulses are palpable bilaterally. CNS, no focal neurological deficit.  Skin, no rash, no petechiae.  Initial laboratory data. White cell count 18.1, hemoglobin 15.0, hematocrit  38.6, MCV 81.6, RDW 14.6, platelets 501. Sodium 130, potassium 3.4, chloride  94, bicarbonate 27, glucose 273, BUN 8, creatinine 0.7, calcium 8.3. Cardiac  marker at the point of care. Myoglobin 135 normal, CK-MB normal, troponin  normal, d-dimer 2.17. BNP 95.   ASSESSMENT AND PLAN:  [name] is a pleasant 59 year old Caucasian male  with a history of  diabetes mellitus, hypertension, and hyperlipidemia. He is  presenting with shortness of breath at rest and on exertion without cough.  He has had low grade fever and some chills, pleuritic chest pain. He has an  elevated d-dimer white cell count and chest x-ray suggestive of unilateral  edema. Clinically, the patient does not appear to be in CHF. In addition,  his BNP is 95.  Problem 1. Shortness of breath:  We need to rule out pulmonary embolus.  Given the elevated white cell count and low grade fever with chills, will  treat for pneumonia with ceftriaxone 1 g IV q.d. and Zithromax 500 mg IV  q.d. Check serial cardiac enzymes. CT chest angiogram. Will check influenza  A and B antigen, nebulize with Atrovent and albuterol. Obtain 2-D  echocardiogram.  Problem 2. Diabetes mellitus:  Check hemoglobin A1C. Continue with  Glucophage XR 2 g p.o. q.h.s. and Amaryl 4 mg p.o. q.d. Use sliding scale  insulin in addition. The patient is open to starting Lantus if necessary.  Problem 3. Obesity  Problem 4. Hypertension:  We started Hyzaar 12.5/50 p.o. q. day.  Problem 5. Hyperlipidemia:  Check fasting lipid profile.  Problem 6. Hyponatremia:  The patient is asymptomatic probably from poor  p.o. intake.  Problem 7. Hypokalemia:  Will repeat with Kay Ciel 14 mcg  Problem 8. Tobacco abuse:  Obtain smoking cessation counseling.  Problem 9. DVT prophylaxis with Lovenox 40 mg subcu q. day.      MBB/MEDQ  D:  05/01/2004  T:  05/01/2004  Job:  [id number]

## 2010-07-15 NOTE — Discharge Summary (Signed)
[name], [name]               ACCOUNT NO.:  [id number]   MEDICAL RECORD NO.:  [id number]          PATIENT TYPE:  INP   LOCATION:  0344                         FACILITY:  [facility]   PHYSICIAN:  Hettie Holstein, D.O.    DATE OF BIRTH:  November 21, 1950   DATE OF ADMISSION:  04/30/2004  DATE OF DISCHARGE:  05/05/2004                                 DISCHARGE SUMMARY   PRIMARY CARE PHYSICIAN:  Brooke Bonito, M.D.   ADMISSION DIAGNOSES:  1.  Community-acquired pneumonia.  2.  Poorly controlled diabetes and hypertension.   DISCHARGE DIAGNOSES:  1.  Community-acquired pneumonia, status post course of two days of      inpatient intravenous antibiotics and clinical improvement.  Discharged      on Avelox to complete this course.  2.  Poorly controlled diabetes mellitus with hemoglobin A1C of 11.8 this      admission and initiation of Lantus insulin and diabetic education to be      set up in the outpatient setting.  3.  Poorly controlled hypertension with initiation of labetalol 100 mg twice      daily.  4.  Morbid obesity.  5.  Right upper quadrant pain, status post evaluation this hospitalization      with imaging studies, including an abdominal ultrasound, which revealed      hepatomegaly with diffuse fatty infiltration.  No evidence of      gallstones.  In addition, he underwent other studies, including CT      scanning of the chest that was negative for pulmonary embolus.  Air      space opacities were noted bilaterally.  There was small pleural      effusions.  A 2D echocardiogram report revealed an ejection fraction of      55-65%.  The study was limited due to his body habitus.   DISCHARGE MEDICATIONS:  1.  Aspirin 81 mg daily.  2.  Labetalol 100 mg twice daily.  3.  Hydrochlorothiazide 12.5 mg daily.  4.  Lantus 38 units each evening.  5.  Cozaar 100 mg daily.  6.  Avelox 100 mg daily until May 16, 2004.  7.  Senokot 1 twice daily with instructions to hold for diarrhea.  8.   NovoLog Pen 4 units before each meal.  Check a.m. blood sugars.  He was      instructed to check his a.m. blood sugars and check blood glucose with      symptoms of hypoglycemia.  9.  He was prescribed oxycodone 5-10 mg as needed for moderate-to-severe      pain.   He was instructed to not return to work until he was seen by his primary  care physician.  He was recommended to have a chest x-ray follow-up in two  weeks.   DISPOSITION:  He was set up with [facility] for home oxygen.  He was instructed to follow up with Dr. Juleen China on the week of discharge.   HISTORY OF PRESENT ILLNESS:  For full details, please refer to the dictated  H&P by Dr. Corky Downs;  however, briefly, this is a 60 year old male with a  history of diabetes, hypertension, hyperlipidemia, who presented with  shortness of breath and dyspnea on exertion and a low-grade temperature, as  well as pleuritic chest pain.  He had edema on his chest x-ray, and D-dimer  was elevated in the emergency department.   HOSPITAL COURSE:  He was admitted and treated with empiric coverage for  community-acquired pneumonia and underwent 2D echocardiogram as well as  laboratory studies that did reveal normal BNP and a normal 2D echocardiogram  as well as a normal LDL at 71.  He did have on his CT, patchy air space  disease.  He continued on antibiotics and responded clinically to therapy.  On CT scan of his chest, evaluation for PE was negative.  It was noted that  his liver was enlarged.  He had been complaining of right upper quadrant  pain, and he underwent ultrasound, which did not reveal significant  abnormality with the exception of fatty infiltration of his liver.  He did  improve and had some difficulty in managing his blood pressure.  He was  initiated on labetalol with adequate control.  Eventually, he did become  adequately controlled on Lantus with regard to his hyperglycemia.  In any  event, he is medically stable  for discharge and is being discharged in close  follow up with his primary care physician this week.      ESS/MEDQ  D:  05/30/2004  T:  05/30/2004  Job:  [id number]   cc:   Brooke Bonito, M.D.  [address]  [place]  [place] [address]  Fax: [phone]

## 2010-07-15 NOTE — Discharge Summary (Signed)
NAME:  [name], [name]               ACCOUNT NO.:  [id number]   MEDICAL RECORD NO.:  [id number]          PATIENT TYPE:  INP   LOCATION:  0358                         [facility]ACILITY[facility]   PHYSICIAN:  Gertha Calkin, M.D.DATE OF BIRTH:  1951-01-14   DATE OF ADMISSION:  06/17/2004  DATE OF DISCHARGE:  06/18/2004                                 DISCHARGE SUMMARY   PRIMARY CARE PHYSICIAN:  Brooke Bonito, M.D.   DISCHARGE DIAGNOSES:  1.  Left lower extremity deep venous thrombosis.  2.  Nodule, history of.  3.  Diabetes, type 2, uncontrolled.  4.  Morbid obesity.  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  6.  Recent pneumonia.   DISCHARGE MEDICATIONS:  1.  To resume home medications, Lantus 15 units daily.  2.  Sliding scale insulin as prior to admission.  3.  Vicodin one tablet p.o. q.6h. p.r.n.  4.  Coumadin as previously dosed.  5.  Lovenox 130 mg subcu injection b.i.d. until INR is therapeutic on      Coumadin.   FOLLOW UP:  The patient is to make an appointment with Dr. Juleen China to be seen  on Monday for lab draw for an INR check.   HOSPITAL COURSE:  Please see H&P for details of admission.  1.  __________ left leg DVT.  The patient was continued on Lovenox during      his hospitalization and his Warfarin.  The pharmacy was dosing the      Warfarin.  The patient was admitted because he had chest pain and CT      scan showed that there was no evidence of pulmonary embolus.  The      patient has been stable throughout, has no complaints of chest pain      during this overnight stay.  Left lower extremity swelling per patient      is actually decreasing slowly since the initiation of Lovenox and      Coumadin therapy from prior hospital discharge.  The patient feels      comfortable at this time to be sent home on Lovenox therapy and to      continue Coumadin therapy also at home and follow up with his primary      care physician.  Questions concerning ambulation and pain were answered  during this hospitalization.  Please see discharge medication list for      pain medication frequency.  2.  All other background medical problems had no changes or additions to his      medication regimen.   DISPOSITION:  Stable on discharge to be followed up in primary care  physician's office until INR is therapeutic from 2-3.      JD/MEDQ  D:  06/18/2004  T:  06/18/2004  Job:  [id number]   cc:   Brooke Bonito, M.D.  [address]  [place]  [place] [address]  Fax: [phone]

## 2010-11-25 LAB — BASIC METABOLIC PANEL
GFR calc Af Amer: >60
GFR calc non Af Amer: >60
Potassium: 4.4
Sodium: 134 — ABNORMAL LOW

## 2011-10-09 ENCOUNTER — Emergency Department (HOSPITAL_BASED_OUTPATIENT_CLINIC_OR_DEPARTMENT_OTHER)
Admission: EM | Admit: 2011-10-09 | Discharge: 2011-10-09 | Disposition: A | Discharge status: Home / Self Care | Payer: BC Managed Care – PPO | Attending: Emergency Medicine | Admitting: Emergency Medicine

## 2011-10-09 ENCOUNTER — Emergency Department (HOSPITAL_BASED_OUTPATIENT_CLINIC_OR_DEPARTMENT_OTHER): Payer: BC Managed Care – PPO

## 2011-10-09 ENCOUNTER — Encounter (HOSPITAL_BASED_OUTPATIENT_CLINIC_OR_DEPARTMENT_OTHER): Payer: Self-pay | Admitting: *Deleted

## 2011-10-09 DIAGNOSIS — K5730 Diverticulosis of large intestine without perforation or abscess without bleeding: Secondary | ICD-10-CM | POA: Insufficient documentation

## 2011-10-09 DIAGNOSIS — Z794 Long term (current) use of insulin: Secondary | ICD-10-CM | POA: Insufficient documentation

## 2011-10-09 DIAGNOSIS — I10 Essential (primary) hypertension: Secondary | ICD-10-CM | POA: Insufficient documentation

## 2011-10-09 DIAGNOSIS — E119 Type 2 diabetes mellitus without complications: Secondary | ICD-10-CM | POA: Insufficient documentation

## 2011-10-09 DIAGNOSIS — R197 Diarrhea, unspecified: Secondary | ICD-10-CM | POA: Insufficient documentation

## 2011-10-09 DIAGNOSIS — R109 Unspecified abdominal pain: Secondary | ICD-10-CM | POA: Insufficient documentation

## 2011-10-09 DIAGNOSIS — Z79899 Other long term (current) drug therapy: Secondary | ICD-10-CM | POA: Insufficient documentation

## 2011-10-09 DIAGNOSIS — K5792 Diverticulitis of intestine, part unspecified, without perforation or abscess without bleeding: Secondary | ICD-10-CM

## 2011-10-09 DIAGNOSIS — R110 Nausea: Secondary | ICD-10-CM | POA: Insufficient documentation

## 2011-10-09 HISTORY — DX: Traumatic subdural hemorrhage with loss of consciousness status unknown, initial encounter: S06.5XAA

## 2011-10-09 HISTORY — DX: Diverticulitis of intestine, part unspecified, without perforation or abscess without bleeding: K57.92

## 2011-10-09 HISTORY — DX: Essential (primary) hypertension: I10

## 2011-10-09 HISTORY — DX: Traumatic subdural hemorrhage with loss of consciousness of unspecified duration, initial encounter: S06.5X9A

## 2011-10-09 LAB — COMPREHENSIVE METABOLIC PANEL
ALT: 21 U/L (ref 0–53)
AST: 18 U/L (ref 0–37)
Albumin: 3.8 g/dL (ref 3.5–5.2)
CO2: 28 mEq/L (ref 19–32)
Calcium: 9.2 mg/dL (ref 8.4–10.5)
Chloride: 98 mEq/L (ref 96–112)
GFR calc non Af Amer: >90 mL/min (ref >90)
Sodium: 135 mEq/L (ref 135–145)

## 2011-10-09 LAB — CBC WITH DIFFERENTIAL/PLATELET
Basophils Absolute: 0 10*3/uL (ref 0.0–0.1)
Basophils Relative: 0 % (ref 0–1)
Eosinophils Relative: 2 % (ref 0–5)
Lymphocytes Relative: 25 % (ref 12–46)
Neutro Abs: 7.3 10*3/uL (ref 1.7–7.7)
Platelets: 283 10*3/uL (ref 150–400)
RDW: 14.4 % (ref 11.5–15.5)
WBC: 11.3 10*3/uL — ABNORMAL HIGH (ref 4.0–10.5)

## 2011-10-09 LAB — URINALYSIS, ROUTINE W REFLEX MICROSCOPIC
Leukocytes, UA: NEGATIVE
Nitrite: NEGATIVE
Specific Gravity, Urine: 1.029 (ref 1.005–1.030)
pH: 6 (ref 5.0–8.0)

## 2011-10-09 LAB — URINE MICROSCOPIC-ADD ON

## 2011-10-09 MED ORDER — PROMETHAZINE HCL 25 MG PO TABS: 25.0000 mg | ORAL_TABLET | Freq: Four times a day (QID) | ORAL | Status: DC | PRN

## 2011-10-09 MED ORDER — OXYCODONE-ACETAMINOPHEN 5-325 MG PO TABS: 2.0000 | ORAL_TABLET | ORAL | Status: AC | PRN

## 2011-10-09 MED ORDER — METRONIDAZOLE 500 MG PO TABS: 500.0000 mg | ORAL_TABLET | Freq: Two times a day (BID) | ORAL | Status: AC

## 2011-10-09 MED ORDER — METRONIDAZOLE 500 MG PO TABS: 500.0000 mg | ORAL_TABLET | Freq: Two times a day (BID) | ORAL | Status: DC

## 2011-10-09 MED ORDER — OXYCODONE-ACETAMINOPHEN 5-325 MG PO TABS: 2.0000 | ORAL_TABLET | ORAL | Status: DC | PRN

## 2011-10-09 MED ORDER — IOHEXOL 300 MG/ML  SOLN: 20.0000 mL | INTRAMUSCULAR | Status: AC

## 2011-10-09 MED ORDER — IOHEXOL 300 MG/ML  SOLN
100.0000 mL | Freq: Once | INTRAMUSCULAR | Status: AC | PRN
  Administered 2011-10-09: 100 mL via INTRAVENOUS

## 2011-10-09 MED ORDER — AMOXICILLIN-POT CLAVULANATE 875-125 MG PO TABS: 1.0000 | ORAL_TABLET | Freq: Two times a day (BID) | ORAL | Status: AC

## 2011-10-09 MED ORDER — CIPROFLOXACIN HCL 500 MG PO TABS: 500.0000 mg | ORAL_TABLET | Freq: Two times a day (BID) | ORAL | Status: AC

## 2011-10-09 NOTE — ED Provider Notes (Signed)
Medical screening examination/treatment/procedure(s) were performed by non-physician practitioner and as supervising physician I was immediately available for consultation/collaboration.   Kaylina Cahue B. Bernette Mayers, MD 10/09/11 [phone]

## 2011-10-09 NOTE — ED Notes (Signed)
Pt c/o abd pain  X 1 week . HX diverticulitis

## 2011-10-09 NOTE — ED Provider Notes (Signed)
History     CSN: [id number]  Arrival date & time 10/09/11  1546   First MD Initiated Contact with Patient 10/09/11 1805      Chief Complaint  Patient presents with  . Abdominal Pain    (Consider location/radiation/quality/duration/timing/severity/associated sxs/prior treatment) Patient is a 61 y.o. male presenting with abdominal pain. The history is provided by the patient. No language interpreter was used.  Abdominal Pain The primary symptoms of the illness include abdominal pain. The current episode started more than 2 days ago. The onset of the illness was gradual. The problem has been gradually worsening.  The illness is associated with a recent illness. The patient has had a change in bowel habit. Additional symptoms associated with the illness include chills. Significant associated medical issues include inflammatory bowel disease.  Pt has a history of diverticulitis.  Pt complains of left lower abdominal pain and pain in his right upper abdomen.    Past Medical History  Diagnosis Date  . Diverticulitis   . Diabetes mellitus   . Subdural hematoma   . Hypertension     Past Surgical History  Procedure Date  . Brain surgery   . Tonsillectomy     History reviewed. No pertinent family history.  History  Substance Use Topics  . Smoking status: Never Smoker   . Smokeless tobacco: Not on file  . Alcohol Use: No      Review of Systems  Constitutional: Positive for chills.  Gastrointestinal: Positive for abdominal pain.  All other systems reviewed and are negative.    Allergies  Statins and Avelox  Home Medications   Current Outpatient Rx  Name Route Sig Dispense Refill  . VITAMIN D3 3000 UNITS PO TABS Oral Take 1 tablet by mouth daily.    . INSULIN ASPART 100 UNIT/ML [place] SOLN Subcutaneous Inject 8 Units into the skin 3 (three) times daily before meals.    . INSULIN DETEMIR 100 UNIT/ML [place] SOLN Subcutaneous Inject 50 Units into the skin at bedtime.    Marland Kitchen  LISINOPRIL 20 MG PO TABS Oral Take 20 mg by mouth daily. Patient uses 10 mg of this medication.    . ONE-A-DAY MENS PO Oral Take 1 tablet by mouth daily.    Marland Kitchen FISH OIL PO Oral Take 1 capsule by mouth daily.    Marland Kitchen POTASSIUM CHLORIDE PO Oral Take 1 tablet by mouth daily.      BP 157/74  Pulse 79  Temp 98.4 F (36.9 C) (Oral)  Resp 16  Ht 6\' 2"  (1.88 m)  Wt 285 lb (129.275 kg)  BMI 36.59 kg/m2  SpO2 100%  Physical Exam  Nursing note and vitals reviewed. Constitutional: He is oriented to person, place, and time. He appears well-developed and well-nourished.  HENT:  Head: Normocephalic and atraumatic.  Eyes: Pupils are equal, round, and reactive to light.  Neck: Normal range of motion. Neck supple.  Cardiovascular: Normal rate and regular rhythm.   Pulmonary/Chest: Effort normal and breath sounds normal.  Abdominal: Soft. There is tenderness.  Musculoskeletal: Normal range of motion.  Neurological: He is alert and oriented to person, place, and time. He has normal reflexes.  Skin: Skin is warm.  Psychiatric: He has a normal mood and affect.    ED Course  Procedures (including critical care time)  Labs Reviewed  URINALYSIS, ROUTINE W REFLEX MICROSCOPIC - Abnormal; Notable for the following:    Glucose, UA >1000 (*)     All other components within normal limits  CBC  WITH DIFFERENTIAL - Abnormal; Notable for the following:    WBC 11.3 (*)     All other components within normal limits  COMPREHENSIVE METABOLIC PANEL - Abnormal; Notable for the following:    Glucose, Bld 265 (*)     Total Bilirubin 0.2 (*)     All other components within normal limits  LIPASE, BLOOD - Abnormal; Notable for the following:    Lipase 131 (*)     All other components within normal limits  URINE MICROSCOPIC-ADD ON   No results found.   No diagnosis found.    MDM  Pt given rx for flagyl, cipro, phenergan and percocet.   Pt advised to follow up with GI.  Pt also advised of chest findings and  followup       Elson Areas, [place] 10/09/11 2139

## 2012-02-28 HISTORY — PX: COLONOSCOPY: SHX174

## 2012-03-07 ENCOUNTER — Emergency Department (HOSPITAL_COMMUNITY)
Admission: EM | Admit: 2012-03-07 | Discharge: 2012-03-07 | Disposition: A | Discharge status: Home / Self Care | Payer: BC Managed Care – PPO | Attending: Emergency Medicine | Admitting: Emergency Medicine

## 2012-03-07 ENCOUNTER — Encounter (HOSPITAL_COMMUNITY): Payer: Self-pay | Admitting: Emergency Medicine

## 2012-03-07 DIAGNOSIS — I10 Essential (primary) hypertension: Secondary | ICD-10-CM | POA: Insufficient documentation

## 2012-03-07 DIAGNOSIS — Z8719 Personal history of other diseases of the digestive system: Secondary | ICD-10-CM | POA: Insufficient documentation

## 2012-03-07 DIAGNOSIS — Z79899 Other long term (current) drug therapy: Secondary | ICD-10-CM | POA: Insufficient documentation

## 2012-03-07 DIAGNOSIS — Z8669 Personal history of other diseases of the nervous system and sense organs: Secondary | ICD-10-CM | POA: Insufficient documentation

## 2012-03-07 DIAGNOSIS — E119 Type 2 diabetes mellitus without complications: Secondary | ICD-10-CM | POA: Insufficient documentation

## 2012-03-07 DIAGNOSIS — Z794 Long term (current) use of insulin: Secondary | ICD-10-CM | POA: Insufficient documentation

## 2012-03-07 DIAGNOSIS — K648 Other hemorrhoids: Secondary | ICD-10-CM | POA: Insufficient documentation

## 2012-03-07 LAB — COMPREHENSIVE METABOLIC PANEL
ALT: 25 U/L (ref 0–53)
AST: 19 U/L (ref 0–37)
Albumin: 3.7 g/dL (ref 3.5–5.2)
CO2: 28 mEq/L (ref 19–32)
Chloride: 100 mEq/L (ref 96–112)
Creatinine, Ser: 0.86 mg/dL (ref 0.50–1.35)
Potassium: 3.7 mEq/L (ref 3.5–5.1)
Sodium: 138 mEq/L (ref 135–145)
Total Bilirubin: 0.2 mg/dL — ABNORMAL LOW (ref 0.3–1.2)

## 2012-03-07 LAB — CBC WITH DIFFERENTIAL/PLATELET
Basophils Absolute: 0.1 10*3/uL (ref 0.0–0.1)
Basophils Relative: 1 % (ref 0–1)
Lymphocytes Relative: 19 % (ref 12–46)
MCHC: 33.3 g/dL (ref 30.0–36.0)
Monocytes Absolute: 0.8 10*3/uL (ref 0.1–1.0)
Neutro Abs: 8.5 10*3/uL — ABNORMAL HIGH (ref 1.7–7.7)
Neutrophils Relative %: 73 % (ref 43–77)
Platelets: 328 10*3/uL (ref 150–400)
RDW: 14.1 % (ref 11.5–15.5)
WBC: 11.8 10*3/uL — ABNORMAL HIGH (ref 4.0–10.5)

## 2012-03-07 LAB — ABO/RH: ABO/RH(D): O POS

## 2012-03-07 LAB — TYPE AND SCREEN: Antibody Screen: NEGATIVE

## 2012-03-07 MED ORDER — SODIUM CHLORIDE 0.9 % IV SOLN
INTRAVENOUS | Status: DC
  Administered 2012-03-07: 21:00:00 via INTRAVENOUS

## 2012-03-07 MED ORDER — SODIUM CHLORIDE 0.9 % IV BOLUS (SEPSIS)
500.0000 mL | Freq: Once | INTRAVENOUS | Status: AC
  Administered 2012-03-07: 500 mL via INTRAVENOUS

## 2012-03-07 NOTE — ED Notes (Signed)
Per patient, history of diverticulitis, loose stool with bright red blood-patient states large amount-slight dizziness

## 2012-03-07 NOTE — Discharge Instructions (Signed)
Soak in warm tub, twice a day. Drink a lot of fluids. See Dr. Madilyn Fireman, if not better in 3 or 4 days.   Hemorrhoid Banding Hemorrhoids are veins in the anus and lower rectum that become enlarged. The most common symptoms are rectal bleeding, itching, and sometimes pain. Hemorrhoids might come out with straining or having a bowel movement, and they can sometimes be pushed back in. There are internal and external hemorrhoids. Only internal hemorrhoids can be treated with banding. In this procedure, a rubber band is placed near the hemorrhoid tissue, cutting off the blood supply. This procedure prevents the hemorrhoids from slipping down. LET YOUR CAREGIVER KNOW ABOUT: All medicines you are taking, especially blood thinners such as aspirin and coumadin.  RISKS AND COMPLICATIONS This is not a painful procedure, but if you do have intense pain immediately let your surgeon know because the band may need to be removed. You may have some mild pain or discomfort in the first 2 days or so after treatment. Sometimes there may be delayed bleeding in the first week after treatment.  BEFORE THE PROCEDURE  There is no special preparation needed before banding. Your surgeon may have you do an enema prior to the procedure. You will go home the same day.  HOME CARE INSTRUCTIONS   Your surgeon might instruct you to do sitz baths as needed if you have discomfort or after a bowel movement.  You may be instructed to use fiber supplements. SEEK MEDICAL CARE IF:  You have an increase in pain.  Your pain does not get better. SEEK IMMEDIATE MEDICAL CARE IF:  You have intense pain.  Fever greater than 100.5 F (38.1 C).  Bleeding that does not stop, or pus from the anus. Document Released: 12/11/2008 Document Revised: 05/08/2011 Document Reviewed: 12/11/2008 [facility] Patient Information 2013 [place], [place]. Hemorrhoids Hemorrhoids are enlarged (dilated) veins around the rectum. There are 2 types of hemorrhoids,  and the type of hemorrhoid is determined by its location. Internal hemorrhoids occur in the veins just inside the rectum.They are usually not painful, but they may bleed.However, they may poke through to the outside and become irritated and painful. External hemorrhoids involve the veins outside the anus and can be felt as a painful swelling or hard lump near the anus.They are often itchy and may crack and bleed. Sometimes clots will form in the veins. This makes them swollen and painful. These are called thrombosed hemorrhoids. CAUSES Causes of hemorrhoids include:  Pregnancy. This increases the pressure in the hemorrhoidal veins.  Constipation.  Straining to have a bowel movement.  Obesity.  Heavy lifting or other activity that caused you to strain. TREATMENT Most of the time hemorrhoids improve in 1 to 2 weeks. However, if symptoms do not seem to be getting better or if you have a lot of rectal bleeding, your caregiver may perform a procedure to help make the hemorrhoids get smaller or remove them completely.Possible treatments include:  Rubber band ligation. A rubber band is placed at the base of the hemorrhoid to cut off the circulation.  Sclerotherapy. A chemical is injected to shrink the hemorrhoid.  Infrared light therapy. Tools are used to burn the hemorrhoid.  Hemorrhoidectomy. This is surgical removal of the hemorrhoid. HOME CARE INSTRUCTIONS   Increase fiber in your diet. Ask your caregiver about using fiber supplements.  Drink enough water and fluids to keep your urine clear or pale yellow.  Exercise regularly.  Go to the bathroom when you have the urge to  have a bowel movement. Do not wait.  Avoid straining to have bowel movements.  Keep the anal area dry and clean.  Only take over-the-counter or prescription medicines for pain, discomfort, or fever as directed by your caregiver. If your hemorrhoids are thrombosed:  Take warm sitz baths for 20 to 30 minutes,  3 to 4 times per day.  If the hemorrhoids are very tender and swollen, place ice packs on the area as tolerated. Using ice packs between sitz baths may be helpful. Fill a plastic bag with ice. Place a towel between the bag of ice and your skin.  Medicated creams and suppositories may be used or applied as directed.  Do not use a donut-shaped pillow or sit on the toilet for long periods. This increases blood pooling and pain. SEEK MEDICAL CARE IF:   You have increasing pain and swelling that is not controlled with your medicine.  You have uncontrolled bleeding.  You have difficulty or you are unable to have a bowel movement.  You have pain or inflammation outside the area of the hemorrhoids.  You have chills or an oral temperature above 102 F (38.9 C). MAKE SURE YOU:   Understand these instructions.  Will watch your condition.  Will get help right away if you are not doing well or get worse. Document Released: 02/11/2000 Document Revised: 05/08/2011 Document Reviewed: 01/24/2010 [facility] Patient Information 2013 [place], [place].

## 2012-03-07 NOTE — ED Notes (Signed)
MD at bedside. 

## 2012-03-11 NOTE — ED Provider Notes (Signed)
History     CSN: [id number]  Arrival date & time 03/07/12  1800   First MD Initiated Contact with Patient 03/07/12 1913      Chief Complaint  Patient presents with  . Rectal Bleeding    (Consider location/radiation/quality/duration/timing/severity/associated sxs/prior treatment) HPI Comments: Arman Loy is a 62 y.o. male who was working, home, bending over and sitting on the floor today; he suddenly developed painless rectal bleeding. He has never had this before. He has passed bright red clots several times. He has mild, lower abdomen, cramping. There's been no fever, chills, nausea, vomiting, weakness, or dizziness. He feels like the bleeding has slowed down since arriving to the emergency department. He's never had this previously. There are no modifying factors  The history is provided by the patient and a relative.    Past Medical History  Diagnosis Date  . Diverticulitis   . Diabetes mellitus   . Subdural hematoma   . Hypertension     Past Surgical History  Procedure Date  . Brain surgery   . Tonsillectomy     History reviewed. No pertinent family history.  History  Substance Use Topics  . Smoking status: Never Smoker   . Smokeless tobacco: Not on file  . Alcohol Use: No      Review of Systems  All other systems reviewed and are negative.    Allergies  Statins and Avelox  Home Medications   Current Outpatient Rx  Name  Route  Sig  Dispense  Refill  . VITAMIN D-3 5000 UNITS PO TABS   Oral   Take 1 tablet by mouth every Monday, Wednesday, and Friday.         . INSULIN ASPART 100 UNIT/ML [place] SOLN   Subcutaneous   Inject 8 Units into the skin 3 (three) times daily before meals.         . INSULIN DETEMIR 100 UNIT/ML [place] SOLN   Subcutaneous   Inject 50 Units into the skin at bedtime.         Marland Kitchen METOPROLOL SUCCINATE ER 25 MG PO TB24   Oral   Take 12.5 mg by mouth daily.         . ADULT MULTIVITAMIN W/MINERALS CH   Oral   Take 1 tablet  by mouth daily.         Marland Kitchen FISH OIL PO   Oral   Take 1 capsule by mouth daily.         Marland Kitchen POTASSIUM GLUCONATE 595 MG PO TABS   Oral   Take 595 mg by mouth daily.           BP 133/84  Pulse 83  Temp 98.1 F (36.7 C) (Oral)  Resp 16  SpO2 98%  Physical Exam  Nursing note and vitals reviewed. Constitutional: He is oriented to person, place, and time. He appears well-developed and well-nourished.  HENT:  Head: Normocephalic and atraumatic.  Right Ear: External ear normal.  Left Ear: External ear normal.  Eyes: Conjunctivae normal and EOM are normal. Pupils are equal, round, and reactive to light.  Neck: Normal range of motion and phonation normal. Neck supple.  Cardiovascular: Normal rate, regular rhythm, normal heart sounds and intact distal pulses.   Pulmonary/Chest: Effort normal and breath sounds normal. He exhibits no bony tenderness.  Abdominal: Soft. Normal appearance. There is no tenderness.  Genitourinary:       Interval hemorrhoid versus rectal polyp extruding through the anus. The mucosal tissue is torn and  is the evident source of bleeding from the rectum. There is no associated discharge, anal or rectal swelling  Musculoskeletal: Normal range of motion.  Neurological: He is alert and oriented to person, place, and time. He has normal strength. No cranial nerve deficit or sensory deficit. He exhibits normal muscle tone. Coordination normal.  Skin: Skin is warm, dry and intact.  Psychiatric: He has a normal mood and affect. His behavior is normal. Judgment and thought content normal.    ED Course  Procedures (including critical care time)  Labs Reviewed  CBC WITH DIFFERENTIAL - Abnormal; Notable for the following:    WBC 11.8 (*)     Neutro Abs 8.5 (*)     All other components within normal limits  COMPREHENSIVE METABOLIC PANEL - Abnormal; Notable for the following:    Glucose, Bld 143 (*)     Total Bilirubin 0.2 (*)     All other components within normal  limits  OCCULT BLOOD, POC DEVICE - Abnormal; Notable for the following:    Fecal Occult Bld POSITIVE (*)     All other components within normal limits  TYPE AND SCREEN  ABO/RH  LAB REPORT - SCANNED  URINE CULTURE  OCCULT BLOOD X 1 CARD TO LAB, STOOL   Nursing notes, applicable records and vitals reviewed.  Radiologic Images/Reports reviewed.    1. Internal hemorrhoid       MDM  Bleeding internal hemorrhoid. He is hemodynamically stable. Is no indication for further treatment or admission. Doubt metabolic instability, serious bacterial infection or impending vascular collapse; the patient is stable for discharge.     Plan: Home Medications- usual; Home Treatments- rest; Recommended follow up- GI follow up if bleeding continues     Flint Melter, MD 03/12/12 0105

## 2012-12-11 ENCOUNTER — Other Ambulatory Visit (HOSPITAL_COMMUNITY): Payer: Self-pay | Admitting: Endocrinology

## 2012-12-11 DIAGNOSIS — I6529 Occlusion and stenosis of unspecified carotid artery: Secondary | ICD-10-CM

## 2012-12-11 DIAGNOSIS — R011 Cardiac murmur, unspecified: Secondary | ICD-10-CM

## 2012-12-21 ENCOUNTER — Encounter (HOSPITAL_COMMUNITY): Payer: Self-pay | Admitting: Emergency Medicine

## 2012-12-21 ENCOUNTER — Emergency Department (HOSPITAL_COMMUNITY): Payer: BC Managed Care – PPO

## 2012-12-21 ENCOUNTER — Emergency Department (HOSPITAL_COMMUNITY)
Admission: EM | Admit: 2012-12-21 | Discharge: 2012-12-21 | Disposition: A | Discharge status: Home / Self Care | Payer: BC Managed Care – PPO | Attending: Emergency Medicine | Admitting: Emergency Medicine

## 2012-12-21 DIAGNOSIS — K859 Acute pancreatitis without necrosis or infection, unspecified: Secondary | ICD-10-CM

## 2012-12-21 DIAGNOSIS — Z794 Long term (current) use of insulin: Secondary | ICD-10-CM | POA: Insufficient documentation

## 2012-12-21 DIAGNOSIS — I10 Essential (primary) hypertension: Secondary | ICD-10-CM | POA: Insufficient documentation

## 2012-12-21 DIAGNOSIS — Z7982 Long term (current) use of aspirin: Secondary | ICD-10-CM | POA: Insufficient documentation

## 2012-12-21 DIAGNOSIS — E119 Type 2 diabetes mellitus without complications: Secondary | ICD-10-CM | POA: Insufficient documentation

## 2012-12-21 DIAGNOSIS — Z79899 Other long term (current) drug therapy: Secondary | ICD-10-CM | POA: Insufficient documentation

## 2012-12-21 LAB — CBC WITH DIFFERENTIAL/PLATELET
Basophils Absolute: 0.1 10*3/uL (ref 0.0–0.1)
Eosinophils Relative: 2 % (ref 0–5)
HCT: 40.4 % (ref 39.0–52.0)
Lymphocytes Relative: 22 % (ref 12–46)
Monocytes Relative: 8 % (ref 3–12)
Neutro Abs: 6.7 10*3/uL (ref 1.7–7.7)
Platelets: 388 10*3/uL (ref 150–400)
RBC: 5.95 MIL/uL — ABNORMAL HIGH (ref 4.22–5.81)
RDW: 20 % — ABNORMAL HIGH (ref 11.5–15.5)
WBC: 10 10*3/uL (ref 4.0–10.5)

## 2012-12-21 LAB — URINALYSIS W MICROSCOPIC + REFLEX CULTURE
Glucose, UA: NEGATIVE mg/dL
Hgb urine dipstick: NEGATIVE
Nitrite: NEGATIVE
Protein, ur: NEGATIVE mg/dL
Urine-Other: NONE SEEN
Urobilinogen, UA: 1 mg/dL (ref 0.0–1.0)
pH: 6.5 (ref 5.0–8.0)

## 2012-12-21 LAB — COMPREHENSIVE METABOLIC PANEL
ALT: 19 U/L (ref 0–53)
AST: 19 U/L (ref 0–37)
Albumin: 3.8 g/dL (ref 3.5–5.2)
Alkaline Phosphatase: 89 U/L (ref 39–117)
Chloride: 97 mEq/L (ref 96–112)
Potassium: 3.7 mEq/L (ref 3.5–5.1)
Total Bilirubin: 0.2 mg/dL — ABNORMAL LOW (ref 0.3–1.2)

## 2012-12-21 MED ORDER — ONDANSETRON 4 MG PO TBDP: 4.0000 mg | ORAL_TABLET | Freq: Three times a day (TID) | ORAL | Status: DC | PRN

## 2012-12-21 MED ORDER — SODIUM CHLORIDE 0.9 % IV BOLUS (SEPSIS)
1000.0000 mL | Freq: Once | INTRAVENOUS | Status: AC
  Administered 2012-12-21: 1000 mL via INTRAVENOUS

## 2012-12-21 MED ORDER — OXYCODONE HCL 5 MG PO TABS: 5.0000 mg | ORAL_TABLET | ORAL | Status: DC | PRN

## 2012-12-21 MED ORDER — SODIUM CHLORIDE 0.9 % IV BOLUS (SEPSIS): 1000.0000 mL | Freq: Once | INTRAVENOUS | Status: DC

## 2012-12-21 NOTE — ED Provider Notes (Signed)
Pt received in sign out from PA Kirichenko at shift change.  Pt presenting to the ED for right flank pain radiating to right upper quadrant.  No nausea, vomiting, or diarrhea.  Was recent started on vicotiza for DM 1 month ago but no other medication changes.  Labs revealing pancreatitis.  Plan:  U/s pending to r/o gallstone pancreatitis.  If negative, stop victoza and d/c home with PCP FU.  Results for orders placed during the hospital encounter of 12/21/12  CBC WITH DIFFERENTIAL      Result Value Range   WBC 10.0  4.0 - 10.5 K/uL   RBC 5.95 (*) 4.22 - 5.81 MIL/uL   Hemoglobin 12.5 (*) 13.0 - 17.0 g/dL   HCT 47.8  29.5 - 62.1 %   MCV 67.9 (*) 78.0 - 100.0 fL   MCH 21.0 (*) 26.0 - 34.0 pg   MCHC 30.9  30.0 - 36.0 g/dL   RDW 30.8 (*) 65.7 - 84.6 %   Platelets 388  150 - 400 K/uL   Neutrophils Relative % 67  43 - 77 %   Lymphocytes Relative 22  12 - 46 %   Monocytes Relative 8  3 - 12 %   Eosinophils Relative 2  0 - 5 %   Basophils Relative 1  0 - 1 %   Neutro Abs 6.7  1.7 - 7.7 K/uL   Lymphs Abs 2.2  0.7 - 4.0 K/uL   Monocytes Absolute 0.8  0.1 - 1.0 K/uL   Eosinophils Absolute 0.2  0.0 - 0.7 K/uL   Basophils Absolute 0.1  0.0 - 0.1 K/uL   Smear Review MORPHOLOGY UNREMARKABLE    COMPREHENSIVE METABOLIC PANEL      Result Value Range   Sodium 135  135 - 145 mEq/L   Potassium 3.7  3.5 - 5.1 mEq/L   Chloride 97  96 - 112 mEq/L   CO2 28  19 - 32 mEq/L   Glucose, Bld 156 (*) 70 - 99 mg/dL   BUN 12  6 - 23 mg/dL   Creatinine, Ser 9.62  0.50 - 1.35 mg/dL   Calcium 9.6  8.4 - 95.2 mg/dL   Total Protein 7.7  6.0 - 8.3 g/dL   Albumin 3.8  3.5 - 5.2 g/dL   AST 19  0 - 37 U/L   ALT 19  0 - 53 U/L   Alkaline Phosphatase 89  39 - 117 U/L   Total Bilirubin 0.2 (*) 0.3 - 1.2 mg/dL   GFR calc non Af Amer >90  >90 mL/min   GFR calc Af Amer >90  >90 mL/min  LIPASE, BLOOD      Result Value Range   Lipase 462 (*) 11 - 59 U/L  URINALYSIS W MICROSCOPIC + REFLEX CULTURE      Result Value Range    Color, Urine YELLOW  YELLOW   APPearance CLEAR  CLEAR   Specific Gravity, Urine 1.017  1.005 - 1.030   pH 6.5  5.0 - 8.0   Glucose, UA NEGATIVE  NEGATIVE mg/dL   Hgb urine dipstick NEGATIVE  NEGATIVE   Bilirubin Urine NEGATIVE  NEGATIVE   Ketones, ur NEGATIVE  NEGATIVE mg/dL   Protein, ur NEGATIVE  NEGATIVE mg/dL   Urobilinogen, UA 1.0  0.0 - 1.0 mg/dL   Nitrite NEGATIVE  NEGATIVE   Leukocytes, UA NEGATIVE  NEGATIVE   Urine-Other       Value: NO FORMED ELEMENTS SEEN ON URINE MICROSCOPIC EXAMINATION   US Abdomen  Complete  12/21/2012   CLINICAL DATA:  Right upper quadrant abdominal pain.  EXAM: ULTRASOUND ABDOMEN COMPLETE  COMPARISON:  None.  FINDINGS: Gallbladder  No gallstones or wall thickening visualized. No sonographic Murphy sign noted.  Common bile duct  Diameter: Measures 4.6 mm which is within normal limits.  Liver  No focal lesion identified. Increased echogenicity is noted consistent with fatty infiltration.  IVC  No abnormality visualized.  Pancreas  Visualized portion unremarkable.  Spleen  Size and appearance within normal limits.  Right Kidney  Length: Measures 13.8 cm. Echogenicity within normal limits. No mass or hydronephrosis visualized.  Left Kidney  Length: Measures 14.1 cm. Echogenicity within normal limits. No mass or hydronephrosis visualized.  Abdominal aorta  No aneurysm visualized.  IMPRESSION: Fatty infiltration of the liver. No other definite abnormality seen in the abdomen.   Electronically Signed   By: Roque Lias M.D.   On: 12/21/2012 16:52    U/s as above-- negative for gallstones or signs of cholecystitis.  Pt with minimal pain, and no nausea or vomiting at this time.  Will attempt to manage OP with oxycodone and zofran.  Instructed to stop victoza until he speaks with his PCP on Monday regarding this ED visit.  Discussed plan with pt, he agreed.  Strict return precautions given should sx worsen or change.  VS stable at time of d/c.  Filed Vitals:   12/21/12  1718  BP: 150/90  Pulse: 81  Temp: 98.3 F (36.8 C)  Resp: [address]     Garlon Hatchet, [place] 12/21/12 1900

## 2012-12-21 NOTE — ED Provider Notes (Signed)
Medical screening examination/treatment/procedure(s) were performed by non-physician practitioner and as supervising physician I was immediately available for consultation/collaboration.  Toy Baker, MD 12/21/12 2242

## 2012-12-21 NOTE — ED Provider Notes (Signed)
CSN: [id number]     Arrival date & time 12/21/12  1210 History   First MD Initiated Contact with Patient 12/21/12 1230     Chief Complaint  Patient presents with  . Abdominal Pain   (Consider location/radiation/quality/duration/timing/severity/associated sxs/prior Treatment) HPI [name] is a 62 y.o. male who presents to emergency department with complaint of a right flank pain. Patient states that his flank pain started several days ago. States worsening. States it radiates into the right upper abdomen. He denies any nausea, vomiting, changes in bowels. States last bowel movement was 2 hours ago and was normal. States pain is worsened with palpation. Denies any fever, chills, urinary symptoms, hematuria. He denies any history of abdominal surgeries. He is not a heavy alcohol drinker. States he was started on Victoza a month ago, and states he read somewhere that it can cause pancreatitis. Patient did not take any medications for this pain prior to coming in.  Past Medical History  Diagnosis Date  . Diverticulitis   . Diabetes mellitus   . Subdural hematoma   . Hypertension    Past Surgical History  Procedure Laterality Date  . Brain surgery    . Tonsillectomy     History reviewed. No pertinent family history. History  Substance Use Topics  . Smoking status: Never Smoker   . Smokeless tobacco: Not on file  . Alcohol Use: No    Review of Systems  Constitutional: Negative for fever and chills.  Respiratory: Negative for cough, chest tightness and shortness of breath.   Cardiovascular: Negative for chest pain, palpitations and leg swelling.  Gastrointestinal: Positive for abdominal pain. Negative for nausea, vomiting, diarrhea and abdominal distention.  Genitourinary: Positive for flank pain. Negative for dysuria, urgency, frequency and hematuria.  Musculoskeletal: Negative for arthralgias, myalgias, neck pain and neck stiffness.  Skin: Negative for rash.   Allergic/Immunologic: Negative for immunocompromised state.  Neurological: Negative for dizziness, weakness, light-headedness, numbness and headaches.    Allergies  Lyrica; Statins; and Avelox  Home Medications   Current Outpatient Rx  Name  Route  Sig  Dispense  Refill  . aspirin 325 MG EC tablet   Oral   Take 325 mg by mouth daily.         . Cholecalciferol (VITAMIN D-3) 5000 UNITS TABS   Oral   Take 1 tablet by mouth every Monday, Wednesday, and Friday.         . insulin aspart (NOVOLOG) 100 UNIT/ML injection   Subcutaneous   Inject 10 Units into the skin 2 (two) times daily.          . insulin detemir (LEVEMIR) 100 UNIT/ML injection   Subcutaneous   Inject 50 Units into the skin at bedtime.         . irbesartan (AVAPRO) 300 MG tablet   Oral   Take 300 mg by mouth at bedtime.         . Multiple Vitamin (MULTIVITAMIN WITH MINERALS) TABS   Oral   Take 1 tablet by mouth daily.         . Omega-3 Fatty Acids (FISH OIL PO)   Oral   Take 1 capsule by mouth daily.         . potassium gluconate 595 MG TABS   Oral   Take 595 mg by mouth daily.         Marland Kitchen terazosin (HYTRIN) 5 MG capsule   Oral   Take 5 mg by mouth at bedtime.  BP 149/73  Pulse 78  Temp(Src) 97.8 F (36.6 C) (Oral)  Resp 18  Ht 6\' 2"  (1.88 m)  Wt 280 lb (127.007 kg)  BMI 35.93 kg/m2  SpO2 98% Physical Exam  Nursing note and vitals reviewed. Constitutional: He appears well-developed and well-nourished. No distress.  HENT:  Head: Normocephalic and atraumatic.  Eyes: Conjunctivae are normal.  Neck: Neck supple.  Cardiovascular: Normal rate, regular rhythm and normal heart sounds.   Pulmonary/Chest: Effort normal. No respiratory distress. He has no wheezes. He has no rales.  Abdominal: Soft. Bowel sounds are normal. He exhibits no distension and no mass. There is tenderness. There is no rebound and no guarding.  Right upper quadrant tenderness, right CVA tenderness   Musculoskeletal: He exhibits no edema.  Neurological: He is alert.  Skin: Skin is warm and dry.  Psychiatric: He has a normal mood and affect. His behavior is normal.    ED Course  Procedures (including critical care time) Labs Review Labs Reviewed  CBC WITH DIFFERENTIAL - Abnormal; Notable for the following:    RBC 5.95 (*)    Hemoglobin 12.5 (*)    MCV 67.9 (*)    MCH 21.0 (*)    RDW 20.0 (*)    All other components within normal limits  COMPREHENSIVE METABOLIC PANEL - Abnormal; Notable for the following:    Glucose, Bld 156 (*)    Total Bilirubin 0.2 (*)    All other components within normal limits  LIPASE, BLOOD - Abnormal; Notable for the following:    Lipase 462 (*)    All other components within normal limits  URINALYSIS W MICROSCOPIC + REFLEX CULTURE   Imaging Review No results found.  EKG Interpretation   None       MDM  No diagnosis found.  Patient with right upper quadrant and right flank pain. He appears comfortable, he is afebrile, he is not vomiting, normal bowel sounds are normal bowel movements. He is in no distress and does not want any pain medications at this time. His lab work that showed that his lipase is elevated at 462. It is possible that this is caused by Victoza, however will get ultrasound to rule out gallbladder disease.  3:22 PM Pt updated about results, [place] ordered. He continues to be in no distress. Understands plan. If [place] negative, stop Victoza, home with pain and nausea medications.  Pt singed out to PA [employer], PA-C 12/22/12 2048

## 2012-12-21 NOTE — ED Notes (Signed)
He c/o right-sided lat. Abd. Area pain.  He theorizes it may be from his being prescribed Victoza, on which he has been for about a month now.  He read online that Victoza may cause pancreatitis.

## 2012-12-25 ENCOUNTER — Ambulatory Visit (HOSPITAL_COMMUNITY)
Admission: RE | Admit: 2012-12-25 | Discharge: 2012-12-25 | Disposition: A | Discharge status: Home / Self Care | Payer: BC Managed Care – PPO | Source: Ambulatory Visit | Attending: Cardiovascular Disease | Admitting: Cardiovascular Disease

## 2012-12-25 DIAGNOSIS — R011 Cardiac murmur, unspecified: Secondary | ICD-10-CM | POA: Insufficient documentation

## 2012-12-25 NOTE — Progress Notes (Signed)
2D Echo Performed 12/25/2012    Aalaysia Liggins, RCS  

## 2012-12-25 NOTE — ED Provider Notes (Signed)
Medical screening examination/treatment/procedure(s) were performed by non-physician practitioner and as supervising physician I was immediately available for consultation/collaboration.  Hoover Grewe L Korin Setzler, MD 12/25/12 1631 

## 2012-12-27 ENCOUNTER — Ambulatory Visit (HOSPITAL_COMMUNITY)
Admission: RE | Admit: 2012-12-27 | Discharge: 2012-12-27 | Disposition: A | Discharge status: Home / Self Care | Payer: BC Managed Care – PPO | Source: Ambulatory Visit | Attending: Cardiovascular Disease | Admitting: Cardiovascular Disease

## 2012-12-27 DIAGNOSIS — I6529 Occlusion and stenosis of unspecified carotid artery: Secondary | ICD-10-CM

## 2012-12-27 NOTE — Progress Notes (Signed)
Carotid Duplex Completed. Lucianne Smestad, BS, RDMS, RVT  

## 2013-11-28 ENCOUNTER — Telehealth (HOSPITAL_COMMUNITY): Payer: Self-pay | Admitting: *Deleted

## 2014-01-29 ENCOUNTER — Ambulatory Visit (INDEPENDENT_AMBULATORY_CARE_PROVIDER_SITE_OTHER): Payer: BC Managed Care – PPO

## 2014-01-29 ENCOUNTER — Ambulatory Visit (INDEPENDENT_AMBULATORY_CARE_PROVIDER_SITE_OTHER): Payer: BC Managed Care – PPO | Admitting: Family Medicine

## 2014-01-29 VITALS — BP 128/88 | HR 84 | Temp 98.3°F | Resp 12 | Ht 76.0 in | Wt 285.2 lb

## 2014-01-29 DIAGNOSIS — J988 Other specified respiratory disorders: Secondary | ICD-10-CM

## 2014-01-29 DIAGNOSIS — M79672 Pain in left foot: Secondary | ICD-10-CM

## 2014-01-29 DIAGNOSIS — E119 Type 2 diabetes mellitus without complications: Secondary | ICD-10-CM

## 2014-01-29 DIAGNOSIS — J22 Unspecified acute lower respiratory infection: Secondary | ICD-10-CM

## 2014-01-29 DIAGNOSIS — S9032XA Contusion of left foot, initial encounter: Secondary | ICD-10-CM

## 2014-01-29 DIAGNOSIS — R05 Cough: Secondary | ICD-10-CM

## 2014-01-29 DIAGNOSIS — R059 Cough, unspecified: Secondary | ICD-10-CM

## 2014-01-29 LAB — POCT CBC
Granulocyte percent: 68.5 %G (ref 37–80)
HCT, POC: 45.2 % (ref 43.5–53.7)
Hemoglobin: 14.4 g/dL (ref 14.1–18.1)
LYMPH, POC: 2.8 (ref 0.6–3.4)
MCH: 25.3 pg — AB (ref 27–31.2)
MCHC: 31.8 g/dL (ref 31.8–35.4)
MCV: 79.4 fL — AB (ref 80–97)
MID (CBC): 0.9 (ref 0–0.9)
MPV: 8.3 fL (ref 0–99.8)
PLATELET COUNT, POC: 307 10*3/uL (ref 142–424)
POC Granulocyte: 8.2 — AB (ref 2–6.9)
POC LYMPH %: 23.9 % (ref 10–50)
POC MID %: 7.6 %M (ref 0–12)
RBC: 5.69 M/uL (ref 4.69–6.13)
RDW, POC: 17 %
WBC: 11.9 10*3/uL — AB (ref 4.6–10.2)

## 2014-01-29 LAB — POCT INFLUENZA A/B
INFLUENZA B, POC: NEGATIVE
Influenza A, POC: NEGATIVE

## 2014-01-29 LAB — GLUCOSE, POCT (MANUAL RESULT ENTRY): POC Glucose: 203 mg/dl — AB (ref 70–99)

## 2014-01-29 MED ORDER — GUAIFENESIN-CODEINE 100-10 MG/5ML PO SYRP: 5.0000 mL | ORAL_SOLUTION | Freq: Four times a day (QID) | ORAL | Status: DC | PRN

## 2014-01-29 MED ORDER — AMOXICILLIN-POT CLAVULANATE 875-125 MG PO TABS: 1.0000 | ORAL_TABLET | Freq: Two times a day (BID) | ORAL | Status: DC

## 2014-01-29 NOTE — Patient Instructions (Signed)
Upper Respiratory Infection, Adult An upper respiratory infection (URI) is also sometimes known as the common cold. The upper respiratory tract includes the nose, sinuses, throat, trachea, and bronchi. Bronchi are the airways leading to the lungs. Most people improve within 1 week, but symptoms can last up to 2 weeks. A residual cough may last even longer.  CAUSES Many different viruses can infect the tissues lining the upper respiratory tract. The tissues become irritated and inflamed and often become very moist. Mucus production is also common. A cold is contagious. You can easily spread the virus to others by oral contact. This includes kissing, sharing a glass, coughing, or sneezing. Touching your mouth or nose and then touching a surface, which is then touched by another person, can also spread the virus. SYMPTOMS  Symptoms typically develop 1 to 3 days after you come in contact with a cold virus. Symptoms vary from person to person. They may include:  Runny nose.  Sneezing.  Nasal congestion.  Sinus irritation.  Sore throat.  Loss of voice (laryngitis).  Cough.  Fatigue.  Muscle aches.  Loss of appetite.  Headache.  Low-grade fever. DIAGNOSIS  You might diagnose your own cold based on familiar symptoms, since most people get a cold 2 to 3 times a year. Your caregiver can confirm this based on your exam. Most importantly, your caregiver can check that your symptoms are not due to another disease such as strep throat, sinusitis, pneumonia, asthma, or epiglottitis. Blood tests, throat tests, and X-rays are not necessary to diagnose a common cold, but they may sometimes be helpful in excluding other more serious diseases. Your caregiver will decide if any further tests are required. RISKS AND COMPLICATIONS  You may be at risk for a more severe case of the common cold if you smoke cigarettes, have chronic heart disease (such as heart failure) or lung disease (such as asthma), or if  you have a weakened immune system. The very young and very old are also at risk for more serious infections. Bacterial sinusitis, middle ear infections, and bacterial pneumonia can complicate the common cold. The common cold can worsen asthma and chronic obstructive pulmonary disease (COPD). Sometimes, these complications can require emergency medical care and may be life-threatening. PREVENTION  The best way to protect against getting a cold is to practice good hygiene. Avoid oral or hand contact with people with cold symptoms. Wash your hands often if contact occurs. There is no clear evidence that vitamin C, vitamin E, echinacea, or exercise reduces the chance of developing a cold. However, it is always recommended to get plenty of rest and practice good nutrition. TREATMENT  Treatment is directed at relieving symptoms. There is no cure. Antibiotics are not effective, because the infection is caused by a virus, not by bacteria. Treatment may include:  Increased fluid intake. Sports drinks offer valuable electrolytes, sugars, and fluids.  Breathing heated mist or steam (vaporizer or shower).  Eating chicken soup or other clear broths, and maintaining good nutrition.  Getting plenty of rest.  Using gargles or lozenges for comfort.  Controlling fevers with ibuprofen or acetaminophen as directed by your caregiver.  Increasing usage of your inhaler if you have asthma. Zinc gel and zinc lozenges, taken in the first 24 hours of the common cold, can shorten the duration and lessen the severity of symptoms. Pain medicines may help with fever, muscle aches, and throat pain. A variety of non-prescription medicines are available to treat congestion and runny nose. Your caregiver   can make recommendations and may suggest nasal or lung inhalers for other symptoms.  HOME CARE INSTRUCTIONS   Only take over-the-counter or prescription medicines for pain, discomfort, or fever as directed by your  caregiver.  Use a warm mist humidifier or inhale steam from a shower to increase air moisture. This may keep secretions moist and make it easier to breathe.  Drink enough water and fluids to keep your urine clear or pale yellow.  Rest as needed.  Return to work when your temperature has returned to normal or as your caregiver advises. You may need to stay home longer to avoid infecting others. You can also use a face mask and careful hand washing to prevent spread of the virus. SEEK MEDICAL CARE IF:   After the first few days, you feel you are getting worse rather than better.  You need your caregiver's advice about medicines to control symptoms.  You develop chills, worsening shortness of breath, or brown or red sputum. These may be signs of pneumonia.  You develop yellow or brown nasal discharge or pain in the face, especially when you bend forward. These may be signs of sinusitis.  You develop a fever, swollen neck glands, pain with swallowing, or white areas in the back of your throat. These may be signs of strep throat. SEEK IMMEDIATE MEDICAL CARE IF:   You have a fever.  You develop severe or persistent headache, ear pain, sinus pain, or chest pain.  You develop wheezing, a prolonged cough, cough up blood, or have a change in your usual mucus (if you have chronic lung disease).  You develop sore muscles or a stiff neck. Document Released: 08/09/2000 Document Revised: 05/08/2011 Document Reviewed: 05/21/2013 Exit[employer] Patient Information 2015 [employer]. This information is not intended to replace advice given to you by your health care provider. Make sure you discuss any questions you have with your health care provider.  

## 2014-01-29 NOTE — Progress Notes (Signed)
Subjective:    Patient ID: [name], male    DOB: September 08, 1950, 63 y.o.   MRN: [id number]  01/29/2014  Foot Injury; Cough; Headache; Nasal Congestion; and Shortness of Breath   HPI This 63 y.o. male presents for evaluation of the following:  1. Cold: onset yesterday.  No fever/chills/sweats.  Bad painful cough in chest.  Last summer had bad respiratory infection and kept taking Mucinex for two weeks and then developed green mucous.  Flying to [place] on Sunday.  No ear pain, sore throat.  Mild rhinorhea and nasal congestion.  Cough is worst symptoms.  +SOB at rest.  No sputum.  No v/d.  Cough drops.  No asthma.  No tobacco.  Telecommunications.    2.  L foot: dropped big bench on L foot.  Yesterday.  Mild swelling.  +Icing.  +pain with weight bearing.   Review of Systems  Constitutional: Negative for fever, chills, diaphoresis and fatigue.  HENT: Positive for congestion and rhinorrhea. Negative for ear pain, sinus pressure and sore throat.   Respiratory: Positive for cough. Negative for shortness of breath and wheezing.   Gastrointestinal: Negative for nausea, vomiting and diarrhea.  Musculoskeletal: Positive for myalgias, joint swelling, arthralgias and gait problem.  Skin: Negative for rash and wound.  Neurological: Negative for headaches.    Past Medical History  Diagnosis Date  . Diverticulitis   . Diabetes mellitus   . Subdural hematoma   . Hypertension   . Clotting disorder    Past Surgical History  Procedure Laterality Date  . Brain surgery    . Tonsillectomy     Allergies  Allergen Reactions  . Lyrica [Pregabalin] Swelling  . Statins Other (See Comments)    Leg problems.  . Avelox [Moxifloxacin Hcl In Nacl] Rash   Current Outpatient Prescriptions  Medication Sig Dispense Refill  . aspirin 325 MG EC tablet Take 325 mg by mouth daily.    . insulin aspart (NOVOLOG) 100 UNIT/ML injection Inject 10 Units into the skin 2 (two) times daily.     . insulin  glargine (LANTUS) 100 UNIT/ML injection Inject 50 Units into the skin at bedtime.    . Multiple Vitamin (MULTIVITAMIN WITH MINERALS) TABS Take 1 tablet by mouth daily.    . Omega-3 Fatty Acids (FISH OIL PO) Take 1 capsule by mouth daily.    . potassium gluconate 595 MG TABS Take 595 mg by mouth daily.    Marland Kitchen terazosin (HYTRIN) 5 MG capsule Take 5 mg by mouth at bedtime.    Marland Kitchen amoxicillin-clavulanate (AUGMENTIN) 875-125 MG per tablet Take 1 tablet by mouth 2 (two) times daily. 20 tablet 0  . guaiFENesin-codeine (ROBITUSSIN AC) 100-10 MG/5ML syrup Take 5 mLs by mouth 4 (four) times daily as needed for cough. 200 mL 0  . irbesartan (AVAPRO) 300 MG tablet Take 300 mg by mouth at bedtime.    Marland Kitchen oxyCODONE (OXY IR/ROXICODONE) 5 MG immediate release tablet Take 1 tablet (5 mg total) by mouth every 4 (four) hours as needed for pain. (Patient not taking: Reported on 01/29/2014) 20 tablet 0   No current facility-administered medications for this visit.       Objective:    BP 128/88 mmHg  Pulse 84  Temp(Src) 98.3 F (36.8 C) (Oral)  Resp 12  Ht 6\' 4"  (1.93 m)  Wt 285 lb 4 oz (129.389 kg)  BMI 34.74 kg/m2  SpO2 97% Physical Exam  Constitutional: He is oriented to person, place, and time. He appears well-developed  and well-nourished. No distress.  HENT:  Head: Normocephalic and atraumatic.  Right Ear: External ear normal.  Left Ear: External ear normal.  Nose: Nose normal.  Mouth/Throat: Oropharynx is clear and moist. No oropharyngeal exudate.  Eyes: Conjunctivae and EOM are normal. Pupils are equal, round, and reactive to light.  Neck: Normal range of motion. Neck supple. Carotid bruit is not present. No thyromegaly present.  Cardiovascular: Normal rate, regular rhythm, normal heart sounds and intact distal pulses.  Exam reveals no gallop and no friction rub.   No murmur heard. Pulmonary/Chest: Effort normal and breath sounds normal. He has no wheezes. He has no rales.  Musculoskeletal:        Left ankle: He exhibits normal range of motion, no swelling, no ecchymosis and no deformity. No tenderness. No lateral malleolus, no medial malleolus, no head of 5th metatarsal and no proximal fibula tenderness found. Achilles tendon normal.       Left lower leg: Normal. He exhibits no tenderness, no bony tenderness, no swelling, no edema and no deformity.       Left foot: There is tenderness, bony tenderness and swelling. There is normal range of motion, normal capillary refill, no deformity and no laceration.  L FOOT: +TTP proximal foot; no TTP metatarsal squeeze.  Lymphadenopathy:    He has no cervical adenopathy.  Neurological: He is alert and oriented to person, place, and time. No cranial nerve deficit.  Skin: Skin is warm and dry. No rash noted. He is not diaphoretic.  Psychiatric: He has a normal mood and affect. His behavior is normal.  Nursing note and vitals reviewed.  Results for orders placed or performed in visit on 01/29/14  POCT Influenza A/B  Result Value Ref Range   Influenza A, POC Negative    Influenza B, POC Negative   POCT CBC  Result Value Ref Range   WBC 11.9 (A) 4.6 - 10.2 K/uL   Lymph, poc 2.8 0.6 - 3.4   POC LYMPH PERCENT 23.9 10 - 50 %L   MID (cbc) 0.9 0 - 0.9   POC MID % 7.6 0 - 12 %M   POC Granulocyte 8.2 (A) 2 - 6.9   Granulocyte percent 68.5 37 - 80 %G   RBC 5.69 4.69 - 6.13 M/uL   Hemoglobin 14.4 14.1 - 18.1 g/dL   HCT, POC 45.2 43.5 - 53.7 %   MCV 79.4 (A) 80 - 97 fL   MCH, POC 25.3 (A) 27 - 31.2 pg   MCHC 31.8 31.8 - 35.4 g/dL   RDW, POC 17.0 %   Platelet Count, POC 307 142 - 424 K/uL   MPV 8.3 0 - 99.8 fL  POCT glucose (manual entry)  Result Value Ref Range   POC Glucose 203 (A) 70 - 99 mg/dl   [facility] reading (PRIMARY) by  Dr. Tamala Julian.  CXR: NAD.  L FOOT: NAD; +old avulsion fracture proximal foot.      Assessment & Plan:   1. Cough   2. Left foot pain   3. Type 2 diabetes mellitus without complication   4. Lower respiratory infection     5. Contusion of left foot, initial encounter      1. Lower respiratory infection:  New.  Rx for Augmentin, Robitussin AC provided. 2.  L foot pain/contusion:  New. Recommend rest, ice, elevation, supportive shoe.  If no improvement one week, RTC.  Avoid prolonged walking or standing. 3. DMII: stable; monitor sugars closely with acute infection.   Meds  ordered this encounter  Medications  . insulin glargine (LANTUS) 100 UNIT/ML injection    Sig: Inject 50 Units into the skin at bedtime.  Marland Kitchen amoxicillin-clavulanate (AUGMENTIN) 875-125 MG per tablet    Sig: Take 1 tablet by mouth 2 (two) times daily.    Dispense:  20 tablet    Refill:  0  . guaiFENesin-codeine (ROBITUSSIN AC) 100-10 MG/5ML syrup    Sig: Take 5 mLs by mouth 4 (four) times daily as needed for cough.    Dispense:  200 mL    Refill:  0    No Follow-up on file.    Reginia Forts, M.D.  [facility] [address] [place], [place]  [address] [phone] phone [phone] fax

## 2014-05-05 ENCOUNTER — Ambulatory Visit: Payer: BLUE CROSS/BLUE SHIELD

## 2014-10-17 ENCOUNTER — Ambulatory Visit (INDEPENDENT_AMBULATORY_CARE_PROVIDER_SITE_OTHER): Payer: BLUE CROSS/BLUE SHIELD

## 2014-10-17 ENCOUNTER — Ambulatory Visit (INDEPENDENT_AMBULATORY_CARE_PROVIDER_SITE_OTHER): Payer: BLUE CROSS/BLUE SHIELD | Admitting: Family Medicine

## 2014-10-17 VITALS — BP 132/70 | HR 87 | Temp 97.8°F | Resp 16 | Ht 74.0 in | Wt 300.0 lb

## 2014-10-17 DIAGNOSIS — M542 Cervicalgia: Secondary | ICD-10-CM

## 2014-10-17 DIAGNOSIS — R202 Paresthesia of skin: Secondary | ICD-10-CM | POA: Diagnosis not present

## 2014-10-17 DIAGNOSIS — T148 Other injury of unspecified body region: Secondary | ICD-10-CM

## 2014-10-17 DIAGNOSIS — M25512 Pain in left shoulder: Secondary | ICD-10-CM

## 2014-10-17 DIAGNOSIS — T148XXA Other injury of unspecified body region, initial encounter: Secondary | ICD-10-CM

## 2014-10-17 MED ORDER — HYDROCODONE-ACETAMINOPHEN 10-325 MG PO TABS: 1.0000 | ORAL_TABLET | Freq: Three times a day (TID) | ORAL | Status: DC | PRN

## 2014-10-17 MED ORDER — CYCLOBENZAPRINE HCL 5 MG PO TABS
5.0000 mg | ORAL_TABLET | Freq: Three times a day (TID) | ORAL | Status: DC | PRN
Start: 2014-10-17 — End: 2015-02-22

## 2014-10-17 NOTE — Progress Notes (Signed)
Chief Complaint:  Chief Complaint  Patient presents with  . pain in shoulder    x 3 days  . pain in neck    x 3 days    HPI: [name] is a 64 y.o. male who reports to [facility] today complaining of twisted on right ankle, and fell in pothole and has left knee abrasion, left shoulde rpain and can;t move it , he has neck pain. No rior left shoulder injury. 10/10 pain, he has some numbness and tinlgingdown into fingers. Could not lift arm until he took hos wife;s vicodin, he did not hit his head. No LOC, only on baby asa  Past Medical History  Diagnosis Date  . Diverticulitis   . Diabetes mellitus   . Subdural hematoma   . Hypertension   . Clotting disorder   . Cataract    Past Surgical History  Procedure Laterality Date  . Brain surgery    . Tonsillectomy     Social History   Social History  . Marital Status: Married    Spouse Name: N/A  . Number of Children: N/A  . Years of Education: N/A   Social History Main Topics  . Smoking status: Former Research scientist (life sciences)  . Smokeless tobacco: Never Used  . Alcohol Use: 0.0 oz/week    0 Standard drinks or equivalent per week  . Drug Use: No  . Sexual Activity: No   Other Topics Concern  . None   Social History Narrative   Family History  Problem Relation Age of Onset  . Stroke Father   . Diabetes Brother    Allergies  Allergen Reactions  . Tamiflu [Oseltamivir Phosphate] Shortness Of Breath  . Lyrica [Pregabalin] Swelling  . Statins Other (See Comments)    Leg problems.  . Avelox [Moxifloxacin Hcl In Nacl] Rash   Prior to Admission medications   Medication Sig Start Date End Date Taking? Authorizing Provider  aspirin 325 MG EC tablet Take 325 mg by mouth daily.   Yes Historical Provider, MD  insulin aspart (NOVOLOG) 100 UNIT/ML injection Inject 10 Units into the skin 2 (two) times daily.    Yes Historical Provider, MD  insulin glargine (LANTUS) 100 UNIT/ML injection Inject 50 Units into the skin at bedtime.   Yes  Historical Provider, MD  irbesartan (AVAPRO) 300 MG tablet Take 300 mg by mouth at bedtime.   Yes Historical Provider, MD  Multiple Vitamin (MULTIVITAMIN WITH MINERALS) TABS Take 1 tablet by mouth daily.   Yes Historical Provider, MD  Omega-3 Fatty Acids (FISH OIL PO) Take 1 capsule by mouth daily.   Yes Historical Provider, MD  potassium gluconate 595 MG TABS Take 595 mg by mouth daily.   Yes Historical Provider, MD  terazosin (HYTRIN) 5 MG capsule Take 5 mg by mouth at bedtime.   Yes Historical Provider, MD  amoxicillin-clavulanate (AUGMENTIN) 875-125 MG per tablet Take 1 tablet by mouth 2 (two) times daily. Patient not taking: Reported on 05/05/2014 01/29/14   Wardell Honour, MD  guaiFENesin-codeine [facility]) 100-10 MG/5ML syrup Take 5 mLs by mouth 4 (four) times daily as needed for cough. Patient not taking: Reported on 05/05/2014 01/29/14   Wardell Honour, MD  oxyCODONE (OXY IR/ROXICODONE) 5 MG immediate release tablet Take 1 tablet (5 mg total) by mouth every 4 (four) hours as needed for pain. Patient not taking: Reported on 01/29/2014 12/21/12   Larene Pickett, PA-C     ROS: The patient denies fevers, chills,  night sweats, unintentional weight loss, chest pain, palpitations, wheezing, dyspnea on exertion, nausea, vomiting, abdominal pain, dysuria, hematuria, melena, numbness, weakness, or tingling.   All other systems have been reviewed and were otherwise negative with the exception of those mentioned in the HPI and as above.    PHYSICAL EXAM: Filed Vitals:   10/17/14 0904  BP: 132/70  Pulse: 87  Temp: 97.8 F (36.6 C)  Resp: 16   Body mass index is 38.5 kg/(m^2).   General: Alert, no acute distress HEENT:  Normocephalic, atraumatic, oropharynx patent. EOMI, PERRLA Cardiovascular:  Regular rate and rhythm, no rubs murmurs or gallops.  No Carotid bruits, radial pulse intact. No pedal edema.  Respiratory: Clear to auscultation bilaterally.  No wheezes, rales, or rhonchi.  No  cyanosis, no use of accessory musculature Abdominal: No organomegaly, abdomen is soft and non-tender, positive bowel sounds. No masses. Skin: No rashes. Neurologic: Facial musculature symmetric. Psychiatric: Patient acts appropriately throughout our interaction. Lymphatic: No cervical or submandibular lymphadenopathy Musculoskeletal: Gait intact. No edema, tenderness   LABS: Results for orders placed or performed in visit on 01/29/14  POCT Influenza A/B  Result Value Ref Range   Influenza A, POC Negative    Influenza B, POC Negative   POCT CBC  Result Value Ref Range   WBC 11.9 (A) 4.6 - 10.2 K/uL   Lymph, poc 2.8 0.6 - 3.4   POC LYMPH PERCENT 23.9 10 - 50 %L   MID (cbc) 0.9 0 - 0.9   POC MID % 7.6 0 - 12 %M   POC Granulocyte 8.2 (A) 2 - 6.9   Granulocyte percent 68.5 37 - 80 %G   RBC 5.69 4.69 - 6.13 M/uL   Hemoglobin 14.4 14.1 - 18.1 g/dL   HCT, POC 45.2 43.5 - 53.7 %   MCV 79.4 (A) 80 - 97 fL   MCH, POC 25.3 (A) 27 - 31.2 pg   MCHC 31.8 31.8 - 35.4 g/dL   RDW, POC 17.0 %   Platelet Count, POC 307 142 - 424 K/uL   MPV 8.3 0 - 99.8 fL  POCT glucose (manual entry)  Result Value Ref Range   POC Glucose 203 (A) 70 - 99 mg/dl     EKG/XRAY:   Primary read interpreted by [facility]. Marin [facility] at [facility]. No obvious fracture or discloaction + DJD   ASSESSMENT/PLAN: Encounter Diagnoses  Name Primary?  . Neck pain Yes  . Left shoulder pain   . Paresthesia   . Sprain and strain    Patient given sling Flexeril and also Norco Call me for ortho referral, he may need MRI Notes for work given Fu prn   Gross sideeffects, risk and benefits, and alternatives of medications d/w patient. Patient is aware that all medications have potential sideeffects and we are unable to predict every sideeffect or drug-drug interaction that may occur.  Quention Mcneill DO  10/17/2014 10:38 AM

## 2014-10-19 ENCOUNTER — Telehealth: Payer: Self-pay

## 2014-10-19 NOTE — Telephone Encounter (Signed)
Patient requesting copy of his x-ray for noon appointment today.   He will complete the MROI when he picks up the forms.

## 2014-12-21 ENCOUNTER — Other Ambulatory Visit (HOSPITAL_COMMUNITY): Payer: Self-pay

## 2014-12-24 ENCOUNTER — Ambulatory Visit (HOSPITAL_COMMUNITY): Admission: RE | Admit: 2014-12-24 | Payer: Self-pay | Source: Ambulatory Visit | Admitting: Orthopedic Surgery

## 2014-12-24 ENCOUNTER — Encounter (HOSPITAL_COMMUNITY): Admission: RE | Payer: Self-pay | Source: Ambulatory Visit

## 2014-12-24 SURGERY — SHOULDER ARTHROSCOPY WITH SUBACROMIAL DECOMPRESSION, ROTATOR CUFF REPAIR AND BICEP TENDON REPAIR
Anesthesia: General | Site: Shoulder | Laterality: Left

## 2015-01-28 HISTORY — PX: EYE SURGERY: SHX253

## 2015-02-23 NOTE — Pre-Procedure Instructions (Signed)
[name]  02/23/2015      [id number] - [place], [place] [address] AT [facility] [address] [place] [address] Phone: [phone] Fax: [phone]    Your procedure is scheduled on Thursday, January 5.  Report to [facility] Admitting at 10:30 A.M.                 Your surgery or procedure is scheduled for 12:30 PM   Call this number if you have problems the morning of surgery:[phone]     Remember:  Do not eat food or drink liquids after midnight.Wednesday, January 4.  Take these medicines the morning of surgery with A SIP OF WATER : amLODipine (NORVASC).                STOP taking Aspirin, Aspirin Products, Herbal Medications ( Fish Oil) and Vitamins.       How to Manage Your Diabetes Before Surgery   Why is it important to control my blood sugar before and after surgery?   Improving blood sugar levels before and after surgery helps healing and can limit problems.  A way of improving blood sugar control is eating a healthy diet by:  - Eating less sugar and carbohydrates  - Increasing activity/exercise  - Talk with your doctor about reaching your blood sugar goals  High blood sugars (greater than 180 mg/dL) can raise your risk of infections and slow down your recovery so you will need to focus on controlling your diabetes during the weeks before surgery.  Make sure that the doctor who takes care of your diabetes knows about your planned surgery including the date and location.  How do I manage my blood sugars before surgery?   Check your blood sugar at least 4 times a day, 2 days before surgery to make sure that they are not too high or low.   Check your blood sugar the morning of your surgery when you wake up and every 2  hours until you get to the Short-Stay unit.  If your blood sugar is less than 70 mg/dL, you will need to treat for low blood sugar by:  Treat a low blood sugar (less  than 70 mg/dL) with 1/2 cup of clear juice (cranberry or apple), 4 glucose tablets, OR glucose gel.  Recheck blood sugar in 15 minutes after treatment (to make sure it is greater than 70 mg/dL).  If blood sugar is not greater than 70 mg/dL on re-check, call [phone] for further instructions.   Report your blood sugar to the Short-Stay nurse when you get to Short-Stay.  References:  [facility], 2007 "How to Manage your Diabetes Before and After Surgery".  What do I do about my diabetes medications?   Do not take oral diabetes medicines (pills) the morning of surgery.  THE NIGHT BEFORE SURGERY, take 24 units of Lantus Insulin.    THE MORNING OF SURGERY, take 15 units of Lantus Insulin.    Do not take other diabetes injectables the day of surgery including Byetta, Victoza, Bydureon, and Trulicity.    If your CBG is greater than 220 mg/dL, you may take 1/2 of your sliding scale (correction) dose of insulin.    Do not we ar jewelry, make-up or nail polish.  Do not wear lotions, powders, or perfumes.    Men may shave face and neck.  Do not bring valuables to the  hospital.  [facility] is not responsible for any belongings or valuables.  Contacts, dentures or bridgework may not be worn into surgery.  Leave your suitcase in the car.  After surgery it may be brought to your room.  For patients admitted to the hospital, discharge time will be determined by your treatment team.  Patients discharged the day of surgery will not be allowed to drive home.   Name and phone number of your driver:   -  Special instructions: Review  [facility] [place] - Preparing For Surgery.  Please read over the following fact sheets that you were given. Pain Booklet, Coughing and Deep Breathing and Surgical Site Infection Prevention and Incentive Spirometry

## 2015-02-24 ENCOUNTER — Inpatient Hospital Stay (HOSPITAL_COMMUNITY)
Admission: RE | Admit: 2015-02-24 | Discharge: 2015-02-24 | Disposition: A | Discharge status: Home / Self Care | Payer: Self-pay | Source: Ambulatory Visit

## 2015-02-24 NOTE — Progress Notes (Signed)
Nurse called patient to inquire about the 0800 scheduled PAT appointment, and patient stated his surgery has not been approved for a overnight stay with the [facility], therefore he was not coming to his PAT appointment. Nurse asked patient if he could take our number to PAT and reschedule once he received information from the [place]. Patient informed Nurse that he was not able to write the number down at this time.

## 2015-02-24 NOTE — Pre-Procedure Instructions (Signed)
[name]  02/24/2015     Your procedure is scheduled on : Thursday March 03, 2014 at 12:30 PM.  Report to [facility] Admitting at 10:30 AM.  Call this number if you have problems the morning of surgery: [phone]    Remember:  Do not eat food or drink liquids after midnight.  Take these medicines the morning of surgery with A SIP OF WATER : Amlodipine (Norvasc)   Please stop taking any vitamins, herbal medications, Omega 3/Fish Oil, Ibuprofen, Advil, Motrin, Aleve, etc on Thursday December 29th   How to Manage Your Diabetes Before Surgery   Why is it important to control my blood sugar before and after surgery?   Improving blood sugar levels before and after surgery helps healing and can limit problems.  A way of improving blood sugar control is eating a healthy diet by:  - Eating less sugar and carbohydrates  - Increasing activity/exercise  - Talk with your doctor about reaching your blood sugar goals  High blood sugars (greater than 180 mg/dL) can raise your risk of infections and slow down your recovery so you will need to focus on controlling your diabetes during the weeks before surgery.  Make sure that the doctor who takes care of your diabetes knows about your planned surgery including the date and location.  How do I manage my blood sugars before surgery?   Check your blood sugar at least 4 times a day, 2 days before surgery to make sure that they are not too high or low.   Check your blood sugar the morning of your surgery when you wake up and every 2 hours until you get to the Short-Stay unit.  If your blood sugar is less than 70 mg/dL, you will need to treat for low blood sugar by:  Treat a low blood sugar (less than 70 mg/dL) with 1/2 cup of clear juice (cranberry or apple), 4 glucose tablets, OR glucose gel.  Recheck blood sugar in 15 minutes after treatment (to make sure it is greater than 70 mg/dL).  If blood sugar is not greater than 70  mg/dL on re-check, call [phone] for further instructions.   Report your blood sugar to the Short-Stay nurse when you get to Short-Stay.  References:  [facility], 2007 "How to Manage your Diabetes Before and After Surgery".  What do I do about my diabetes medications?   Do not take oral diabetes medicines (pills) the morning of surgery.   THE NIGHT BEFORE SURGERY, take 24 units of Lantus Insulin.    THE MORNING OF SURGERY, take 15 units of Lantus Insulin.  If your blood sugar is greater than 220 mg/dL, you may take only 5 units of Novolog insulin the morning of your surgery. IF your blood sugar is NOT greater than 220, then do NOT take Novolog insulin the morning of your surgery    Do not wear jewelry.  Do not wear lotions, powders, or cologne.    Men may shave face and neck.  Do not bring valuables to the hospital.  [facility] is not responsible for any belongings or valuables.  Contacts, dentures or bridgework may not be worn into surgery.  Leave your suitcase in the car.  After surgery it may be brought to your room.  For patients admitted to the hospital, discharge time will be determined by your treatment team.  Patients discharged the day of surgery will not be allowed to drive home.  Name and phone number of your driver:    Special instructions:  Shower using CHG soap the night before and the morning of your surgery  Please read over the following fact sheets that you were given. Pain Booklet, Coughing and Deep Breathing and Surgical Site Infection Prevention

## 2015-03-04 ENCOUNTER — Encounter (HOSPITAL_COMMUNITY): Admission: RE | Payer: Self-pay | Source: Ambulatory Visit

## 2015-03-04 ENCOUNTER — Ambulatory Visit (HOSPITAL_COMMUNITY)
Admission: RE | Admit: 2015-03-04 | Payer: PRIVATE HEALTH INSURANCE | Source: Ambulatory Visit | Admitting: Orthopedic Surgery

## 2015-03-04 SURGERY — SHOULDER ARTHROSCOPY WITH SUBACROMIAL DECOMPRESSION AND OPEN ROTATOR CUFF REPAIR, OPEN BICEPS TENDON REPAIR
Anesthesia: General | Site: Shoulder | Laterality: Left

## 2015-04-15 ENCOUNTER — Ambulatory Visit: Admit: 2015-04-15 | Payer: PRIVATE HEALTH INSURANCE | Admitting: Orthopedic Surgery

## 2015-04-15 SURGERY — SHOULDER ARTHROSCOPY WITH SUBACROMIAL DECOMPRESSION, ROTATOR CUFF REPAIR AND BICEP TENDON REPAIR
Anesthesia: General | Laterality: Left

## 2015-05-07 ENCOUNTER — Encounter (HOSPITAL_COMMUNITY): Payer: Self-pay

## 2015-05-07 ENCOUNTER — Encounter (HOSPITAL_COMMUNITY)
Admission: RE | Admit: 2015-05-07 | Discharge: 2015-05-07 | Disposition: A | Discharge status: Home / Self Care | Payer: No Typology Code available for payment source | Source: Ambulatory Visit | Attending: Orthopedic Surgery | Admitting: Orthopedic Surgery

## 2015-05-07 DIAGNOSIS — G4733 Obstructive sleep apnea (adult) (pediatric): Secondary | ICD-10-CM | POA: Insufficient documentation

## 2015-05-07 DIAGNOSIS — Z01812 Encounter for preprocedural laboratory examination: Secondary | ICD-10-CM | POA: Insufficient documentation

## 2015-05-07 DIAGNOSIS — Z7982 Long term (current) use of aspirin: Secondary | ICD-10-CM | POA: Diagnosis not present

## 2015-05-07 DIAGNOSIS — Z86718 Personal history of other venous thrombosis and embolism: Secondary | ICD-10-CM | POA: Diagnosis not present

## 2015-05-07 DIAGNOSIS — Z01818 Encounter for other preprocedural examination: Secondary | ICD-10-CM | POA: Insufficient documentation

## 2015-05-07 DIAGNOSIS — Z87891 Personal history of nicotine dependence: Secondary | ICD-10-CM | POA: Diagnosis not present

## 2015-05-07 DIAGNOSIS — E119 Type 2 diabetes mellitus without complications: Secondary | ICD-10-CM | POA: Insufficient documentation

## 2015-05-07 DIAGNOSIS — M19012 Primary osteoarthritis, left shoulder: Secondary | ICD-10-CM | POA: Diagnosis not present

## 2015-05-07 DIAGNOSIS — Z79899 Other long term (current) drug therapy: Secondary | ICD-10-CM | POA: Insufficient documentation

## 2015-05-07 DIAGNOSIS — I10 Essential (primary) hypertension: Secondary | ICD-10-CM | POA: Insufficient documentation

## 2015-05-07 DIAGNOSIS — Z794 Long term (current) use of insulin: Secondary | ICD-10-CM | POA: Insufficient documentation

## 2015-05-07 DIAGNOSIS — M25812 Other specified joint disorders, left shoulder: Secondary | ICD-10-CM | POA: Diagnosis not present

## 2015-05-07 DIAGNOSIS — M75102 Unspecified rotator cuff tear or rupture of left shoulder, not specified as traumatic: Secondary | ICD-10-CM | POA: Insufficient documentation

## 2015-05-07 HISTORY — DX: Pneumonia, unspecified organism: J18.9

## 2015-05-07 HISTORY — DX: Sleep apnea, unspecified: G47.30

## 2015-05-07 HISTORY — DX: Peripheral vascular disease, unspecified: I73.9

## 2015-05-07 LAB — COMPREHENSIVE METABOLIC PANEL
ALBUMIN: 3.9 g/dL (ref 3.5–5.0)
ALT: 22 U/L (ref 17–63)
AST: 20 U/L (ref 15–41)
Alkaline Phosphatase: 73 U/L (ref 38–126)
Anion gap: 12 (ref 5–15)
BUN: 12 mg/dL (ref 6–20)
CHLORIDE: 103 mmol/L (ref 101–111)
CO2: 24 mmol/L (ref 22–32)
Calcium: 9.2 mg/dL (ref 8.9–10.3)
Creatinine, Ser: 0.85 mg/dL (ref 0.61–1.24)
GFR calc Af Amer: >60 mL/min (ref >60)
GFR calc non Af Amer: >60 mL/min (ref >60)
GLUCOSE: 180 mg/dL — AB (ref 65–99)
POTASSIUM: 4.2 mmol/L (ref 3.5–5.1)
Sodium: 139 mmol/L (ref 135–145)
Total Bilirubin: 0.4 mg/dL (ref 0.3–1.2)
Total Protein: 7 g/dL (ref 6.5–8.1)

## 2015-05-07 LAB — CBC WITH DIFFERENTIAL/PLATELET
BASOS ABS: 0.1 10*3/uL (ref 0.0–0.1)
BASOS PCT: 1 %
EOS PCT: 2 %
Eosinophils Absolute: 0.2 10*3/uL (ref 0.0–0.7)
HEMATOCRIT: 44.9 % (ref 39.0–52.0)
Hemoglobin: 14.5 g/dL (ref 13.0–17.0)
Lymphocytes Relative: 26 %
Lymphs Abs: 2.6 10*3/uL (ref 0.7–4.0)
MCH: 27.1 pg (ref 26.0–34.0)
MCHC: 32.3 g/dL (ref 30.0–36.0)
MCV: 83.8 fL (ref 78.0–100.0)
MONO ABS: 0.7 10*3/uL (ref 0.1–1.0)
Monocytes Relative: 7 %
Neutro Abs: 6.5 10*3/uL (ref 1.7–7.7)
Neutrophils Relative %: 64 %
PLATELETS: 273 10*3/uL (ref 150–400)
RBC: 5.36 MIL/uL (ref 4.22–5.81)
RDW: 14.2 % (ref 11.5–15.5)
WBC: 10.1 10*3/uL (ref 4.0–10.5)

## 2015-05-07 LAB — APTT: APTT: 29 s (ref 24–37)

## 2015-05-07 LAB — PROTIME-INR
INR: 1.03 (ref 0.00–1.49)
Prothrombin Time: 13.7 seconds (ref 11.6–15.2)

## 2015-05-07 LAB — GLUCOSE, CAPILLARY: GLUCOSE-CAPILLARY: 154 mg/dL — AB (ref 65–99)

## 2015-05-07 NOTE — Pre-Procedure Instructions (Addendum)
[name]  05/07/2015      [id number] - [place] [address] AT [address] [facility] OF [address] [place] [address] Phone: [phone] Fax: [phone]    Your procedure is scheduled on  Thursday, March 23rd   Report to [facility] Admitting at 5:30 AM             (Posted Surgery time is  7:30 - 9:30 am)   Call this number if you have problems the morning of surgery:  [phone]   Remember:  Do not eat food or drink liquids after midnight Wednesday.  Take these medicines the morning of surgery with A SIP OF WATER :          3.18.17           5 days prior to surgery, STOP taking any aspirin, anti-inflammatories, vitamins(mag-ox,multi,vit D), herbal supplements(fish oil)     See insulin inst below   Do not wear jewelry, no rings or watches.  Do not wear lotions or colognes.    You may NOT wear deodorant the day of surgery.              Men may shave face and neck.  Do not bring valuables to the hospital.   [facility] is not responsible for any belongings or valuables.  Contacts, dentures or bridgework may not be worn into surgery.  Leave your suitcase in the car.  After surgery it may be brought to your room. For patients admitted to the hospital, discharge time will be determined by your treatment team. Patients discharged the day of surgery will not be allowed to drive home   Special Instructions: [facility] - Preparing for Surgery  Before surgery, you can play an important role.  Because skin is not sterile, your skin needs to be as free of germs as possible.  You can reduce the number of germs on you skin by washing with CHG (chlorahexidine gluconate) soap before surgery.  CHG is an antiseptic cleaner which kills germs and bonds with the skin to continue killing germs even after washing.  Please DO NOT use if you have an allergy to CHG or antibacterial soaps.  If your skin becomes  reddened/irritated stop using the CHG and inform your nurse when you arrive at Short Stay.  Do not shave (including legs and underarms) for at least 48 hours prior to the first CHG shower.  You may shave your face.  Please follow these instructions carefully:   1.  Shower with CHG Soap the night before surgery and the morning of Surgery.  2.  If you choose to wash your hair, wash your hair first as usual with your normal shampoo.  3.  After you shampoo, rinse your hair and body thoroughly to remove the Shampoo.  4.  Use CHG as you would any other liquid soap.  You can apply chg directly  to the skin and wash gently with scrungie or a clean washcloth.  5.  Apply the CHG Soap to your body ONLY FROM THE NECK DOWN.  Do not use on open wounds or open sores.  Avoid contact with your eyes ears, mouth and genitals (private parts).  Wash genitals (private parts)       with your normal soap.  6.  Wash thoroughly, paying special attention to the area where your surgery will be performed.  7.  Thoroughly rinse your body with warm  water from the neck down.  8.  DO NOT shower/wash with your normal soap after using and rinsing off the CHG Soap.  9.  Pat yourself dry with a clean towel.            10.  Wear clean pajamas.            11.  Place clean sheets on your bed the night of your first shower and do not sleep with pets.  Day of Surgery  Do not apply any lotions/deodorants the morning of surgery.  Please wear clean clothes to the hospital/surgery center.   Please read over the following fact sheets that you were given. Pain Booklet, Coughing and Deep Breathing, MRSA Information and Surgical Site Infection Prevention   How to Manage Your Diabetes Before Surgery   Why is it important to control my blood sugar before and after surgery?   Improving blood sugar levels before and after surgery helps healing and can limit problems.  A way of improving blood sugar control is eating a healthy diet  by:  - Eating less sugar and carbohydrates  - Increasing activity/exercise  - Talk with your doctor about reaching your blood sugar goals  High blood sugars (greater than 180 mg/dL) can raise your risk of infections and slow down your recovery so you will need to focus on controlling your diabetes during the weeks before surgery.  Make sure that the doctor who takes care of your diabetes knows about your planned surgery including the date and location.  How do I manage my blood sugars before surgery?   Check your blood sugar at least 4 times a day, 2 days before surgery to make sure that they are not too high or low.   Check your blood sugar the morning of your surgery when you wake up and every 2               hours until you get to the Short-Stay unit.  If your blood sugar is less than 70 mg/dL, you will need to treat for low blood sugar by:  Treat a low blood sugar (less than 70 mg/dL) with 1/2 cup of clear juice (cranberry or apple), 4 glucose tablets, OR glucose gel.    Recheck blood sugar in 15 minutes after treatment (to make sure it is greater than 70 mg/dL).  If blood sugar is not greater than 70 mg/dL on re-check, call [phone] for further instructions.   Report your blood sugar to the Short-Stay nurse when you get to Short-Stay.  References:  [facility], 2007 "How to Manage your Diabetes Before and After Surgery".  What do I do about my diabetes medications           THE NIGHT BEFORE SURGERY, take 24 units of  Lantus Insulin.(dinner/bedtime dose)                                                               Take usual meal dose of novolog/No bedtime dose        THE MORNING OF SURGERY, take 15  units of lantus  Insulin.          Do not take any novolog insulin except as directed below   If your CBG is  greater than 220 mg/dL, you may take 1/2 of your sliding scale (correction) dose of insulin.

## 2015-05-08 LAB — HEMOGLOBIN A1C
Hgb A1c MFr Bld: 7.6 % — ABNORMAL HIGH (ref 4.8–5.6)
MEAN PLASMA GLUCOSE: 171 mg/dL

## 2015-05-10 ENCOUNTER — Encounter (HOSPITAL_COMMUNITY): Payer: Self-pay

## 2015-05-10 NOTE — Progress Notes (Signed)
Anesthesia Chart Review:  Pt is a 65 year old male scheduled for L shoulder arthroscopy with subacromial decompression, distal clavicle resection, probably rotator cuff repair on 05/20/2015 with Dr. Onnie Graham.   PMH includes:  HTN, DM, OSA (on CPAP), DVT, clotting disorder, subdural hematoma (2010). Former smoker. BMI 38.   Medications include: amlodipine, ASA, novolog, lantus, potassium, terazosin.   Preoperative labs reviewed. HgbA1c 7.6, glucose 180.   EKG 05/07/15: NSR  Carotid duplex 12/27/12:  1. R ICA 40-69% diameter reduction 2. L ICA demonstrated a mild amount of fibrous plaque with no evidence of significant diameter reduction, tortuosity or other vascular abnormality.   Echo 12/25/12:  - Left ventricle: The cavity size was normal. There was moderate concentric hypertrophy. Systolic function was vigorous. The estimated ejection fraction was in the range of 65% to 70%. Wall motion was normal; there were noregional wall motion abnormalities. Doppler parameters are consistent with abnormal left ventricular relaxation (grade 1 diastolic dysfunction). The E/e' ratio is >15, suggesting markedly elevated LV filling pressure. - Aortic valve: Poorly visualized. Transvalvular velocity was minimally increased. There was no stenosis. No regurgitation. - Mitral valve: Posterior calcified annulus. Mild regurgitation. - Right ventricle: RV systolic pressure: 99991111 Hg (S, est). - Tricuspid valve: Mild regurgitation.  Cardiac cath 07/29/08:  1. Nonobstructive coronary artery disease. Mid LAD 30%, diagonal 40%, mid Cx 30%, posterior descending 40%. RCA with luminal irregularities.  2. Normal left ventricular systolic function. EF 50-55% 3. Noncardiac etiology of chest pain.  Reviewed case with Dr. Oletta Lamas. Confirmed with pt he has not had a carotid duplex since 2014. Left voicemail for Velvet in Dr. Susie Cassette office to let Dr. Onnie Graham know pt overdue for check on carotid stenosis.   If no changes, I  anticipate pt can proceed with surgery as scheduled.   Willeen Cass, FNP-BC [facility]/Anesthesiology Phone: [phone] 05/10/2015 3:46 PM

## 2015-05-19 MED ORDER — LACTATED RINGERS IV SOLN
INTRAVENOUS | Status: DC
  Administered 2015-05-20: 07:00:00 via INTRAVENOUS

## 2015-05-19 MED ORDER — CHLORHEXIDINE GLUCONATE 4 % EX LIQD: 60.0000 mL | Freq: Once | CUTANEOUS | Status: DC

## 2015-05-19 MED ORDER — DEXTROSE 5 % IV SOLN
3.0000 g | INTRAVENOUS | Status: AC
  Administered 2015-05-20: 3 g via INTRAVENOUS
  Filled 2015-05-19: qty 3000

## 2015-05-20 ENCOUNTER — Observation Stay (HOSPITAL_COMMUNITY)
Admission: RE | Admit: 2015-05-20 | Discharge: 2015-05-21 | Disposition: A | Discharge status: Home / Self Care | Payer: No Typology Code available for payment source | Source: Ambulatory Visit | Attending: Orthopedic Surgery | Admitting: Orthopedic Surgery

## 2015-05-20 ENCOUNTER — Encounter (HOSPITAL_COMMUNITY)
Admission: RE | Disposition: A | Discharge status: Home / Self Care | Payer: Self-pay | Source: Ambulatory Visit | Attending: Orthopedic Surgery

## 2015-05-20 ENCOUNTER — Encounter (HOSPITAL_COMMUNITY): Payer: Self-pay | Admitting: General Practice

## 2015-05-20 ENCOUNTER — Ambulatory Visit (HOSPITAL_COMMUNITY): Payer: No Typology Code available for payment source | Admitting: Emergency Medicine

## 2015-05-20 ENCOUNTER — Observation Stay (HOSPITAL_COMMUNITY): Payer: No Typology Code available for payment source

## 2015-05-20 ENCOUNTER — Ambulatory Visit (HOSPITAL_COMMUNITY): Payer: No Typology Code available for payment source | Admitting: Anesthesiology

## 2015-05-20 DIAGNOSIS — S43492A Other sprain of left shoulder joint, initial encounter: Secondary | ICD-10-CM | POA: Insufficient documentation

## 2015-05-20 DIAGNOSIS — Z7982 Long term (current) use of aspirin: Secondary | ICD-10-CM | POA: Diagnosis not present

## 2015-05-20 DIAGNOSIS — E1165 Type 2 diabetes mellitus with hyperglycemia: Secondary | ICD-10-CM | POA: Insufficient documentation

## 2015-05-20 DIAGNOSIS — E1151 Type 2 diabetes mellitus with diabetic peripheral angiopathy without gangrene: Secondary | ICD-10-CM | POA: Diagnosis not present

## 2015-05-20 DIAGNOSIS — R0989 Other specified symptoms and signs involving the circulatory and respiratory systems: Secondary | ICD-10-CM | POA: Diagnosis not present

## 2015-05-20 DIAGNOSIS — Z87891 Personal history of nicotine dependence: Secondary | ICD-10-CM | POA: Diagnosis not present

## 2015-05-20 DIAGNOSIS — Z9889 Other specified postprocedural states: Secondary | ICD-10-CM

## 2015-05-20 DIAGNOSIS — M19012 Primary osteoarthritis, left shoulder: Secondary | ICD-10-CM | POA: Diagnosis not present

## 2015-05-20 DIAGNOSIS — Z86718 Personal history of other venous thrombosis and embolism: Secondary | ICD-10-CM | POA: Diagnosis not present

## 2015-05-20 DIAGNOSIS — Z794 Long term (current) use of insulin: Secondary | ICD-10-CM | POA: Diagnosis not present

## 2015-05-20 DIAGNOSIS — Z79899 Other long term (current) drug therapy: Secondary | ICD-10-CM | POA: Insufficient documentation

## 2015-05-20 DIAGNOSIS — N400 Enlarged prostate without lower urinary tract symptoms: Secondary | ICD-10-CM | POA: Insufficient documentation

## 2015-05-20 DIAGNOSIS — X58XXXA Exposure to other specified factors, initial encounter: Secondary | ICD-10-CM | POA: Diagnosis not present

## 2015-05-20 DIAGNOSIS — G4730 Sleep apnea, unspecified: Secondary | ICD-10-CM | POA: Diagnosis not present

## 2015-05-20 DIAGNOSIS — M7542 Impingement syndrome of left shoulder: Principal | ICD-10-CM | POA: Insufficient documentation

## 2015-05-20 DIAGNOSIS — R0602 Shortness of breath: Secondary | ICD-10-CM | POA: Insufficient documentation

## 2015-05-20 DIAGNOSIS — R079 Chest pain, unspecified: Secondary | ICD-10-CM | POA: Insufficient documentation

## 2015-05-20 DIAGNOSIS — I10 Essential (primary) hypertension: Secondary | ICD-10-CM | POA: Insufficient documentation

## 2015-05-20 DIAGNOSIS — Z6838 Body mass index (BMI) 38.0-38.9, adult: Secondary | ICD-10-CM | POA: Diagnosis not present

## 2015-05-20 DIAGNOSIS — M7502 Adhesive capsulitis of left shoulder: Secondary | ICD-10-CM | POA: Insufficient documentation

## 2015-05-20 DIAGNOSIS — M75102 Unspecified rotator cuff tear or rupture of left shoulder, not specified as traumatic: Secondary | ICD-10-CM | POA: Diagnosis not present

## 2015-05-20 DIAGNOSIS — R1013 Epigastric pain: Secondary | ICD-10-CM | POA: Diagnosis not present

## 2015-05-20 HISTORY — PX: SHOULDER ARTHROSCOPY WITH SUBACROMIAL DECOMPRESSION, ROTATOR CUFF REPAIR AND BICEP TENDON REPAIR: SHX5687

## 2015-05-20 HISTORY — PX: ROTATOR CUFF REPAIR W/ DISTAL CLAVICLE EXCISION: SHX2365

## 2015-05-20 HISTORY — DX: Other specified postprocedural states: Z98.890

## 2015-05-20 LAB — GLUCOSE, CAPILLARY
GLUCOSE-CAPILLARY: 99 mg/dL (ref 65–99)
GLUCOSE-CAPILLARY: 118 mg/dL — AB (ref 65–99)
GLUCOSE-CAPILLARY: 150 mg/dL — AB (ref 65–99)
Glucose-Capillary: 109 mg/dL — ABNORMAL HIGH (ref 65–99)
Glucose-Capillary: 155 mg/dL — ABNORMAL HIGH (ref 65–99)

## 2015-05-20 LAB — BASIC METABOLIC PANEL
ANION GAP: 9 (ref 5–15)
BUN: 10 mg/dL (ref 6–20)
CHLORIDE: 104 mmol/L (ref 101–111)
CO2: 26 mmol/L (ref 22–32)
Calcium: 8.7 mg/dL — ABNORMAL LOW (ref 8.9–10.3)
Creatinine, Ser: 0.91 mg/dL (ref 0.61–1.24)
GFR calc Af Amer: >60 mL/min (ref >60)
GFR calc non Af Amer: >60 mL/min (ref >60)
GLUCOSE: 192 mg/dL — AB (ref 65–99)
POTASSIUM: 4.2 mmol/L (ref 3.5–5.1)
Sodium: 139 mmol/L (ref 135–145)

## 2015-05-20 SURGERY — SHOULDER ARTHROSCOPY WITH SUBACROMIAL DECOMPRESSION, ROTATOR CUFF REPAIR AND BICEP TENDON REPAIR
Anesthesia: Regional | Site: Shoulder | Laterality: Left

## 2015-05-20 MED ORDER — BISACODYL 5 MG PO TBEC: 5.0000 mg | DELAYED_RELEASE_TABLET | Freq: Every day | ORAL | Status: DC | PRN

## 2015-05-20 MED ORDER — ROCURONIUM BROMIDE 50 MG/5ML IV SOLN
INTRAVENOUS | Status: AC
  Filled 2015-05-20: qty 1

## 2015-05-20 MED ORDER — HYDROMORPHONE HCL 1 MG/ML IJ SOLN
1.0000 mg | INTRAMUSCULAR | Status: DC | PRN
  Administered 2015-05-20: 1 mg via INTRAVENOUS
  Filled 2015-05-20: qty 1

## 2015-05-20 MED ORDER — ROCURONIUM BROMIDE 100 MG/10ML IV SOLN
INTRAVENOUS | Status: DC | PRN
  Administered 2015-05-20: 50 mg via INTRAVENOUS

## 2015-05-20 MED ORDER — ACETAMINOPHEN 325 MG PO TABS: 325.0000 mg | ORAL_TABLET | ORAL | Status: DC | PRN

## 2015-05-20 MED ORDER — AMLODIPINE BESYLATE 10 MG PO TABS
10.0000 mg | ORAL_TABLET | Freq: Every day | ORAL | Status: DC
  Administered 2015-05-20 – 2015-05-21 (×2): 10 mg via ORAL
  Filled 2015-05-20 (×2): qty 1

## 2015-05-20 MED ORDER — TERAZOSIN HCL 5 MG PO CAPS
5.0000 mg | ORAL_CAPSULE | Freq: Every day | ORAL | Status: DC
  Administered 2015-05-20: 5 mg via ORAL
  Filled 2015-05-20: qty 1

## 2015-05-20 MED ORDER — MIDAZOLAM HCL 2 MG/2ML IJ SOLN
INTRAMUSCULAR | Status: AC
  Filled 2015-05-20: qty 2

## 2015-05-20 MED ORDER — OXYCODONE HCL 5 MG PO TABS
5.0000 mg | ORAL_TABLET | ORAL | Status: DC | PRN
  Administered 2015-05-20 – 2015-05-21 (×6): 10 mg via ORAL
  Filled 2015-05-20 (×6): qty 2

## 2015-05-20 MED ORDER — KETOROLAC TROMETHAMINE 15 MG/ML IJ SOLN
7.5000 mg | Freq: Four times a day (QID) | INTRAMUSCULAR | Status: AC
  Administered 2015-05-20 – 2015-05-21 (×4): 7.5 mg via INTRAVENOUS
  Filled 2015-05-20 (×4): qty 1

## 2015-05-20 MED ORDER — LIDOCAINE HCL (CARDIAC) 20 MG/ML IV SOLN
INTRAVENOUS | Status: DC | PRN
  Administered 2015-05-20: 80 mg via INTRAVENOUS

## 2015-05-20 MED ORDER — POLYETHYLENE GLYCOL 3350 17 G PO PACK: 17.0000 g | PACK | Freq: Every day | ORAL | Status: DC | PRN

## 2015-05-20 MED ORDER — EPHEDRINE SULFATE 50 MG/ML IJ SOLN
INTRAMUSCULAR | Status: AC
  Filled 2015-05-20: qty 1

## 2015-05-20 MED ORDER — INSULIN ASPART 100 UNIT/ML ~~LOC~~ SOLN
0.0000 [IU] | Freq: Three times a day (TID) | SUBCUTANEOUS | Status: DC
  Administered 2015-05-20: 3 [IU] via SUBCUTANEOUS
  Administered 2015-05-21: 4 [IU] via SUBCUTANEOUS
  Administered 2015-05-21: 3 [IU] via SUBCUTANEOUS

## 2015-05-20 MED ORDER — OXYCODONE HCL 5 MG PO TABS: 5.0000 mg | ORAL_TABLET | Freq: Once | ORAL | Status: DC | PRN

## 2015-05-20 MED ORDER — FENTANYL CITRATE (PF) 100 MCG/2ML IJ SOLN: 25.0000 ug | INTRAMUSCULAR | Status: DC | PRN

## 2015-05-20 MED ORDER — ACETAMINOPHEN 325 MG PO TABS
650.0000 mg | ORAL_TABLET | Freq: Four times a day (QID) | ORAL | Status: DC | PRN
  Administered 2015-05-20: 650 mg via ORAL
  Filled 2015-05-20: qty 2

## 2015-05-20 MED ORDER — INSULIN GLARGINE 100 UNIT/ML ~~LOC~~ SOLN
30.0000 [IU] | Freq: Two times a day (BID) | SUBCUTANEOUS | Status: DC
Start: 2015-05-20 — End: 2015-05-21
  Administered 2015-05-20 – 2015-05-21 (×2): 30 [IU] via SUBCUTANEOUS
  Filled 2015-05-20 (×3): qty 0.3

## 2015-05-20 MED ORDER — OXYCODONE-ACETAMINOPHEN 5-325 MG PO TABS
1.0000 | ORAL_TABLET | ORAL | Status: DC | PRN
  Administered 2015-05-21 (×2): 2 via ORAL
  Filled 2015-05-20 (×2): qty 2

## 2015-05-20 MED ORDER — ONDANSETRON HCL 4 MG/2ML IJ SOLN
4.0000 mg | Freq: Four times a day (QID) | INTRAMUSCULAR | Status: DC | PRN
  Administered 2015-05-21: 4 mg via INTRAVENOUS
  Filled 2015-05-20: qty 2

## 2015-05-20 MED ORDER — SUCCINYLCHOLINE CHLORIDE 20 MG/ML IJ SOLN
INTRAMUSCULAR | Status: AC
  Filled 2015-05-20: qty 1

## 2015-05-20 MED ORDER — ACETAMINOPHEN 650 MG RE SUPP: 650.0000 mg | Freq: Four times a day (QID) | RECTAL | Status: DC | PRN

## 2015-05-20 MED ORDER — FENTANYL CITRATE (PF) 250 MCG/5ML IJ SOLN
INTRAMUSCULAR | Status: AC
  Filled 2015-05-20: qty 5

## 2015-05-20 MED ORDER — SODIUM CHLORIDE 0.9 % IR SOLN
Status: DC | PRN
  Administered 2015-05-20: 3000 mL

## 2015-05-20 MED ORDER — PHENOL 1.4 % MT LIQD: 1.0000 | OROMUCOSAL | Status: DC | PRN

## 2015-05-20 MED ORDER — MENTHOL 3 MG MT LOZG: 1.0000 | LOZENGE | OROMUCOSAL | Status: DC | PRN

## 2015-05-20 MED ORDER — LACTATED RINGERS IV SOLN
INTRAVENOUS | Status: DC
  Administered 2015-05-20: 13:00:00 via INTRAVENOUS

## 2015-05-20 MED ORDER — PHENYLEPHRINE HCL 10 MG/ML IJ SOLN
10.0000 mg | INTRAMUSCULAR | Status: DC | PRN
  Administered 2015-05-20: 15 ug/min via INTRAVENOUS

## 2015-05-20 MED ORDER — ONDANSETRON HCL 4 MG PO TABS: 4.0000 mg | ORAL_TABLET | Freq: Four times a day (QID) | ORAL | Status: DC | PRN

## 2015-05-20 MED ORDER — SODIUM CHLORIDE 0.9 % IJ SOLN
INTRAMUSCULAR | Status: AC
  Filled 2015-05-20: qty 10

## 2015-05-20 MED ORDER — PROMETHAZINE HCL 25 MG RE SUPP: 25.0000 mg | Freq: Four times a day (QID) | RECTAL | Status: DC | PRN

## 2015-05-20 MED ORDER — PROPOFOL 10 MG/ML IV BOLUS
INTRAVENOUS | Status: AC
  Filled 2015-05-20: qty 20

## 2015-05-20 MED ORDER — METOCLOPRAMIDE HCL 5 MG PO TABS: 5.0000 mg | ORAL_TABLET | Freq: Three times a day (TID) | ORAL | Status: DC | PRN

## 2015-05-20 MED ORDER — PROMETHAZINE HCL 25 MG PO TABS: 25.0000 mg | ORAL_TABLET | Freq: Four times a day (QID) | ORAL | Status: DC | PRN

## 2015-05-20 MED ORDER — INSULIN ASPART 100 UNIT/ML ~~LOC~~ SOLN
4.0000 [IU] | Freq: Three times a day (TID) | SUBCUTANEOUS | Status: DC
Start: 2015-05-21 — End: 2015-05-21
  Administered 2015-05-21 (×2): 4 [IU] via SUBCUTANEOUS

## 2015-05-20 MED ORDER — DIPHENHYDRAMINE HCL 12.5 MG/5ML PO ELIX: 12.5000 mg | ORAL_SOLUTION | ORAL | Status: DC | PRN

## 2015-05-20 MED ORDER — LIDOCAINE HCL (CARDIAC) 20 MG/ML IV SOLN
INTRAVENOUS | Status: AC
  Filled 2015-05-20: qty 5

## 2015-05-20 MED ORDER — DIAZEPAM 5 MG PO TABS
5.0000 mg | ORAL_TABLET | Freq: Four times a day (QID) | ORAL | Status: DC | PRN
  Administered 2015-05-20 – 2015-05-21 (×4): 5 mg via ORAL
  Filled 2015-05-20 (×4): qty 1

## 2015-05-20 MED ORDER — GLYCOPYRROLATE 0.2 MG/ML IJ SOLN
INTRAMUSCULAR | Status: DC | PRN
  Administered 2015-05-20: 0.4 mg via INTRAVENOUS

## 2015-05-20 MED ORDER — FLEET ENEMA 7-19 GM/118ML RE ENEM: 1.0000 | ENEMA | Freq: Once | RECTAL | Status: DC | PRN

## 2015-05-20 MED ORDER — BUPIVACAINE-EPINEPHRINE (PF) 0.5% -1:200000 IJ SOLN
INTRAMUSCULAR | Status: DC | PRN
  Administered 2015-05-20: 20 mL via PERINEURAL

## 2015-05-20 MED ORDER — ONDANSETRON HCL 4 MG/2ML IJ SOLN
INTRAMUSCULAR | Status: AC
  Filled 2015-05-20: qty 2

## 2015-05-20 MED ORDER — ALUM & MAG HYDROXIDE-SIMETH 200-200-20 MG/5ML PO SUSP
30.0000 mL | ORAL | Status: DC | PRN
  Administered 2015-05-20 – 2015-05-21 (×3): 30 mL via ORAL
  Filled 2015-05-20 (×3): qty 30

## 2015-05-20 MED ORDER — MIDAZOLAM HCL 5 MG/5ML IJ SOLN
INTRAMUSCULAR | Status: DC | PRN
  Administered 2015-05-20: 2 mg via INTRAVENOUS

## 2015-05-20 MED ORDER — FENTANYL CITRATE (PF) 100 MCG/2ML IJ SOLN
INTRAMUSCULAR | Status: DC | PRN
  Administered 2015-05-20: 150 ug via INTRAVENOUS
  Administered 2015-05-20: 50 ug via INTRAVENOUS

## 2015-05-20 MED ORDER — OXYCODONE HCL 5 MG/5ML PO SOLN: 5.0000 mg | Freq: Once | ORAL | Status: DC | PRN

## 2015-05-20 MED ORDER — METOCLOPRAMIDE HCL 5 MG/ML IJ SOLN
5.0000 mg | Freq: Three times a day (TID) | INTRAMUSCULAR | Status: DC | PRN
  Administered 2015-05-21: 10 mg via INTRAVENOUS
  Filled 2015-05-20: qty 2

## 2015-05-20 MED ORDER — ONDANSETRON HCL 4 MG/2ML IJ SOLN
INTRAMUSCULAR | Status: DC | PRN
  Administered 2015-05-20: 4 mg via INTRAVENOUS

## 2015-05-20 MED ORDER — PROPOFOL 10 MG/ML IV BOLUS
INTRAVENOUS | Status: DC | PRN
  Administered 2015-05-20: 160 mg via INTRAVENOUS
  Administered 2015-05-20: 40 mg via INTRAVENOUS

## 2015-05-20 MED ORDER — INSULIN ASPART 100 UNIT/ML ~~LOC~~ SOLN: 0.0000 [IU] | Freq: Every day | SUBCUTANEOUS | Status: DC

## 2015-05-20 MED ORDER — ACETAMINOPHEN 160 MG/5ML PO SOLN
325.0000 mg | ORAL | Status: DC | PRN
  Filled 2015-05-20: qty 20.3

## 2015-05-20 MED ORDER — DOCUSATE SODIUM 100 MG PO CAPS
100.0000 mg | ORAL_CAPSULE | Freq: Two times a day (BID) | ORAL | Status: DC
  Administered 2015-05-20 – 2015-05-21 (×3): 100 mg via ORAL
  Filled 2015-05-20 (×3): qty 1

## 2015-05-20 MED ORDER — NEOSTIGMINE METHYLSULFATE 10 MG/10ML IV SOLN
INTRAVENOUS | Status: DC | PRN
  Administered 2015-05-20: 3 mg via INTRAVENOUS

## 2015-05-20 SURGICAL SUPPLY — 60 items
ANCHOR PEEK 4.75X19.1 SWLK C (Anchor) ×6 IMPLANT
BLADE CUTTER GATOR 3.5 (BLADE) ×2 IMPLANT
BLADE GREAT WHITE 4.2 (BLADE) ×2 IMPLANT
BLADE SURG 11 STRL SS (BLADE) ×2 IMPLANT
BOOTCOVER CLEANROOM LRG (PROTECTIVE WEAR) ×4 IMPLANT
BUR OVAL 4.0 (BURR) ×2 IMPLANT
CANISTER SUCT LVC 12 LTR MEDI- (MISCELLANEOUS) ×2 IMPLANT
CANNULA ACUFLEX KIT 5X76 (CANNULA) ×2 IMPLANT
CANNULA DRILOCK 5.0X75 (CANNULA) ×2 IMPLANT
CANNULA TWIST IN 8.25X7CM (CANNULA) ×2 IMPLANT
CLSR STERI-STRIP ANTIMIC 1/2X4 (GAUZE/BANDAGES/DRESSINGS) ×2 IMPLANT
CONNECTOR 5 IN 1 STRAIGHT STRL (MISCELLANEOUS) ×2 IMPLANT
DRAPE INCISE 23X17 IOBAN STRL (DRAPES)
DRAPE INCISE IOBAN 23X17 STRL (DRAPES) IMPLANT
DRAPE ORTHO SPLIT 77X108 STRL (DRAPES) ×2
DRAPE STERI 35X30 U-POUCH (DRAPES) IMPLANT
DRAPE SURG 17X11 SM STRL (DRAPES) ×2 IMPLANT
DRAPE SURG ORHT 6 SPLT 77X108 (DRAPES) ×2 IMPLANT
DRAPE U-SHAPE 47X51 STRL (DRAPES) IMPLANT
DRSG PAD ABDOMINAL 8X10 ST (GAUZE/BANDAGES/DRESSINGS) ×2 IMPLANT
DURAPREP 26ML APPLICATOR (WOUND CARE) ×4 IMPLANT
GAUZE SPONGE 4X4 12PLY STRL (GAUZE/BANDAGES/DRESSINGS) ×2 IMPLANT
GLOVE BIO SURGEON STRL SZ7.5 (GLOVE) ×2 IMPLANT
GLOVE BIO SURGEON STRL SZ8 (GLOVE) ×2 IMPLANT
GLOVE EUDERMIC 7 POWDERFREE (GLOVE) ×2 IMPLANT
GLOVE SS BIOGEL STRL SZ 7.5 (GLOVE) ×1 IMPLANT
GLOVE SUPERSENSE BIOGEL SZ 7.5 (GLOVE) ×1
GOWN STRL REUS W/ TWL LRG LVL3 (GOWN DISPOSABLE) ×1 IMPLANT
GOWN STRL REUS W/ TWL XL LVL3 (GOWN DISPOSABLE) ×4 IMPLANT
GOWN STRL REUS W/TWL LRG LVL3 (GOWN DISPOSABLE) ×1
GOWN STRL REUS W/TWL XL LVL3 (GOWN DISPOSABLE) ×4
IMMOBILIZER SHOULDER FOAM XLGE (SOFTGOODS) ×2 IMPLANT
KIT BASIN OR (CUSTOM PROCEDURE TRAY) ×2 IMPLANT
KIT ROOM TURNOVER OR (KITS) ×2 IMPLANT
KIT SHOULDER TRACTION (DRAPES) ×2 IMPLANT
MANIFOLD NEPTUNE II (INSTRUMENTS) ×2 IMPLANT
NDL SUT 6 .5 CRC .975X.05 MAYO (NEEDLE) IMPLANT
NEEDLE MAYO TAPER (NEEDLE)
NEEDLE SCORPION MULTI FIRE (NEEDLE) ×2 IMPLANT
NEEDLE SPNL 18GX3.5 QUINCKE PK (NEEDLE) ×2 IMPLANT
NS IRRIG 1000ML POUR BTL (IV SOLUTION) ×2 IMPLANT
PACK SHOULDER (CUSTOM PROCEDURE TRAY) ×2 IMPLANT
PAD ARMBOARD 7.5X6 YLW CONV (MISCELLANEOUS) ×4 IMPLANT
SET ARTHROSCOPY TUBING (MISCELLANEOUS) ×1
SET ARTHROSCOPY TUBING LN (MISCELLANEOUS) ×1 IMPLANT
SLING ARM FOAM STRAP LRG (SOFTGOODS) IMPLANT
SLING ARM MED ADULT FOAM STRAP (SOFTGOODS) ×2 IMPLANT
SPONGE LAP 4X18 X RAY DECT (DISPOSABLE) IMPLANT
STRIP CLOSURE SKIN 1/2X4 (GAUZE/BANDAGES/DRESSINGS) ×2 IMPLANT
SUT MNCRL AB 3-0 PS2 18 (SUTURE) ×2 IMPLANT
SUT PDS AB 0 CT 36 (SUTURE) IMPLANT
SUT RETRIEVER GRASP 30 DEG (SUTURE) IMPLANT
SUT TIGER TAPE 7 IN WHITE (SUTURE) ×2 IMPLANT
SYR 20CC LL (SYRINGE) IMPLANT
TAPE FIBER 2MM 7IN #2 BLUE (SUTURE) ×2 IMPLANT
TAPE PAPER 3X10 WHT MICROPORE (GAUZE/BANDAGES/DRESSINGS) ×2 IMPLANT
TOWEL OR 17X24 6PK STRL BLUE (TOWEL DISPOSABLE) ×2 IMPLANT
TOWEL OR 17X26 10 PK STRL BLUE (TOWEL DISPOSABLE) ×2 IMPLANT
WAND SUCTION MAX 4MM 90S (SURGICAL WAND) ×2 IMPLANT
WATER STERILE IRR 1000ML POUR (IV SOLUTION) ×2 IMPLANT

## 2015-05-20 NOTE — H&P (Signed)
[name]    Chief Complaint: LEFT SHOULDER PARTIAL VERSE FULL ROTATOR CUFF TEAR WITH AC OA  HPI: The patient is a 65 y.o. 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 Diverticulitis   . Diabetes mellitus   . Subdural hematoma ([place])   . Hypertension   . Clotting disorder ([place])     DVT in 2006 x1; no problems since  . Cataract   . Peripheral vascular disease ([place]) 2006    dvt 's  . Sleep apnea     cpap started 2 months ago set on 4  . Pneumonia 06    Past Surgical History  Procedure Laterality Date  . Tonsillectomy    . Brain surgery  2010    subdural hematoma -bleed  . Dg thumb left hand Left     "gamers "  . Eye surgery Right 12/16    cataract    Family History  Problem Relation Age of Onset  . Stroke Father   . Diabetes Brother     Social History:  reports that he quit smoking about 34 years ago. His smoking use included Cigarettes. He has a 15 pack-year smoking history. He has never used smokeless tobacco. He reports that he drinks about 6.0 oz of alcohol per week. He reports that he does not use illicit drugs.   Medications Prior to Admission  Medication Sig Dispense Refill  . amLODipine (NORVASC) 10 MG tablet Take 10 mg by mouth daily.    Marland Kitchen aspirin 325 MG EC tablet Take 325 mg by mouth daily.    . insulin aspart (NOVOLOG) 100 UNIT/ML injection Inject 12 Units into the skin 3 (three) times daily with meals.     . insulin glargine (LANTUS) 100 UNIT/ML injection Inject 30 Units into the skin 2 (two) times daily.     . magnesium oxide (MAG-OX) 400 MG tablet Take 400 mg by mouth at bedtime.    . Multiple Vitamin (MULTIVITAMIN WITH MINERALS) TABS Take 1 tablet by mouth daily.    . Omega-3 Fatty Acids (FISH OIL PO) Take 1 capsule by mouth daily.    . potassium gluconate 595 MG TABS Take 595 mg by mouth daily.    Marland Kitchen terazosin (HYTRIN) 5 MG capsule Take 5 mg by mouth at bedtime.    .  Vitamin D, Ergocalciferol, (DRISDOL) 50000 units CAPS capsule Take 50,000 Units by mouth 3 (three) times a week. MON WED FRI       Physical Exam: left shoulder with painful and limited motion as noted at recent office visits  Vitals  Temp:  [99.1 F (37.3 C)] 99.1 F (37.3 C) (03/23 0635) Pulse Rate:  [80] 80 (03/23 0610) Resp:  [18] 18 (03/23 0610) BP: (140)/(63) 140/63 mmHg (03/23 0610) SpO2:  [97 %] 97 % (03/23 0610) Weight:  [134.355 kg (296 lb 3.2 oz)] 134.355 kg (296 lb 3.2 oz) (03/23 0610)  Assessment/Plan  Impression: LEFT SHOULDER PARTIAL VERSE FULL ROTATOR CUFF TEAR WITH AC OA   Plan of Action: Procedure(s): LEFT SHOULDER ARTHROSCOPY WITH SUBACROMIAL DECOMPRESSION,DISTAL CLAVICAL RESECTION PROBABLE ROTATOR CUFF REPAIR   Delissa Silba M Michalle Rademaker 05/20/2015, 7:24 AM Contact # [phone]

## 2015-05-20 NOTE — Op Note (Signed)
NAME:  [name] NO.:  [id number]  MEDICAL RECORD NO.:  [id number]  LOCATION:  [facility]                         FACILITY:  [facility]  PHYSICIAN:  Metta Clines. Keneisha Heckart, M.D.  DATE OF BIRTH:  03/29/1950  DATE OF PROCEDURE:  05/20/2015 DATE OF DISCHARGE:                              OPERATIVE REPORT   PREOPERATIVE DIAGNOSES: 1. Chronic left shoulder pain with impingement syndrome. 2. Left shoulder partial versus full-thickness rotator cuff tear. 3. Left shoulder acromioclavicular arthropathy.  POSTOPERATIVE DIAGNOSES: 1. Chronic left shoulder impingement. 2. Left shoulder symptomatic acromioclavicular arthropathy. 3. Left shoulder full-thickness rotator cuff tear. 4. Left shoulder adhesive capsulitis. 5. Left shoulder labral tear.  PROCEDURE: 1. Left shoulder examination under anesthesia. 2. Left shoulder manipulation under anesthesia. 3. Left shoulder glenoid joint diagnostic arthroscopy. 4. Labral debridement. 5. Extensive synovectomy with lysis of adhesions. 6. Arthroscopic subacromial decompression and bursectomy. 7. Arthroscopic distal clavicle resection. 8. Arthroscopic rotator cuff repair using a double-row suture repair     construct.  SURGEON:  Metta Clines. Vinessa Macconnell, M.D.  Terrence DupontOlivia Mackie A. Shuford, P.A.-C.  ANESTHESIA:  General endotracheal as well as interscalene block.  ESTIMATED BLOOD LOSS:  Minimal.  DRAINS:  None.  HISTORY:  [name] is a 65 year old gentleman who has had chronic left shoulder pain related to impingement syndrome and symptomatic AC joint arthritis with MRI scan showing a significant partial, not an occult full-thickness tear of the rotator cuff.  Due to his ongoing pain from the patient's failure to respond and improve with conservative management, he is brought to the operating room at this time for planned left shoulder arthroscopy as described below.  Preoperatively, I counseled Mr. Stadtlander regarding treatment  options, potential risks versus benefits thereof.  Possible surgical complications were reviewed including bleeding, infection, neurovascular injury, persistent pain, loss of motion, anesthetic complication, recurrence of rotator cuff tear and possible need for additional surgery.  He understands and accepts and agrees with our planned procedure.  PROCEDURE IN DETAIL:  After undergoing routine preop evaluation, the patient received prophylactic antibiotics.  An interscalene block was established in the holding area with the Anesthesia Department.  Placed supine on the operating table.  Underwent smooth induction of general endotracheal anesthesia.  Turned to the right lateral decubitus position on a beanbag and appropriately padded and protected.  Left shoulder examination under anesthesia revealed elevation flexion to 120 degrees. At this time, a moderately firm endpoint was encounter.  The manipulation was performed with palpable and audible release of adhesions achieving 170 degrees of elevation and 90 degrees of rotation. The left arm was then suspended at 70 degrees abduction with 15 pounds of traction.  The left shoulder girdle region was sterilely prepped and draped in standard fashion.  Time-out was called.  A posterior portal was established in the glenoid joint and anterior portal was established under direct visualization.  A large hemarthrosis was evacuated.  There was extensive and diffuse synovitis particularly in the superior half of the shoulder joint and these areas were all debrided with a shaver.  A number of adhesions were performed with an extensive synovectomy with lysis of adhesions and debridement of degenerative tear involving the superior half  of the labrum.  The biceps tendon was stable proximally and distally with normal caliber, although we did see significant attenuation and thinning of the distal supraspinatus, although an obvious full-thickness defect was  not visualized from the articular side.  We then completed the debridement within the glenoid joint and fluid was removed.  The arm was dropped into 30 degrees of abduction. Arthroscope introduced in the subacromial space of the posterior portal and a direct lateral portal in the seventh subacromial space.  Abundant dense bursal tissue and multiple adhesions were encountered, and these were divided and excised with a shaver and Stryker wand.  Wand used to remove the periosteum from the undersurface of the anterior half of the acromion.  The acromion showed a very large anterior-inferior acromial "hook."  There was also a large osteophyte emanating from the [facility] joint. A bur was then used to perform a subacromial decompression creating a type 1 morphology.  Then, we established portals to the anterior and distal clavicle and distal clavicle resection was performed with a bur removing approximately 8 mm of bone.  We then completed the subacromial subdeltoid bursectomy.  The rotator cuff did show a full-thickness defect approximately 2.5 cm in width.  Once we completely debrided the greater tuberosity, I debrided the rotator cuff margin back to healthy tissue.  We prepared our footprint gently abrading the bone to bleeding bed, then through a stab wound of lateral margin acromion placed an Arthrex SwiveLock suture anchor loaded with 2 FiberTapes.  All 6 suture limbs were then shuttled equidistant across the width of the rotator cuff tear using the scorpion suture fashion, and we placed 2 with lateral row anchors 4.75 SwiveLocks creating a double row repair which nicely approximated the torn margin of the rotator cuff against bony bed and tuberosity, and overall construct was much to our satisfaction.  I should note that the overall bone quality was somewhat soft which will impact our rehab protocol, strict passive motion and gentle passive motion with an absolute pain-free challenges only  through the first 6 weeks.  At the completion of the repair, we clipped all sutures to appropriate length.  Bursectomy was then completed, hemostasis was obtained.  Fluid was removed.  The portals closed with Monocryl and Steri-Strips.  A dry dressing taped at the left shoulder.  Left arm was placed in a sling. The patient was awakened, extubated, and taken to recovery room in stable condition.  Jenetta Loges, PA-C was used as an Environmental consultant throughout this case and essential for help with positioning the patient, considering his morbid obesity, positioning the extremity, management of the arthroscopic equipment, tissue manipulation, suture management, wound closure, and intraoperative decision making.     Metta Clines. Feleica Fulmore, M.D.     KMS/MEDQ  D:  05/20/2015  T:  05/20/2015  Job:  [id number]

## 2015-05-20 NOTE — Progress Notes (Signed)
RT NOTE:  CPAP setup @ bedside for patient, home settings, nasal mask, O2 adapter, humidity chamber filled. Pt was not ready to go on CPAP @ the time RT was there. He said he would have RN call when he got ready.

## 2015-05-20 NOTE — Op Note (Signed)
05/20/2015  9:27 AM  PATIENT:   [name]  65 y.o. male  PRE-OPERATIVE DIAGNOSIS:  LEFT SHOULDER PARTIAL VERSE FULL ROTATOR CUFF TEAR WITH AC OA and impingement  POST-OPERATIVE DIAGNOSIS:  Same with adhesive capsulitis and labral tearing.  PROCEDURE:  LSA, MUA, synovectomy, labral debridement, SAD, DCR, RCR  SURGEON:  Kam Rahimi, Metta Clines. M.D.  ASSISTANTS: Shuford pac   ANESTHESIA:   GET + ISB  EBL: min  SPECIMEN:  none  Drains: none   PATIENT DISPOSITION:  PACU - hemodynamically stable.    PLAN OF CARE: Admit for overnight observation  Dictation# [id number]   Contact # [phone]

## 2015-05-20 NOTE — Transfer of Care (Signed)
Immediate Anesthesia Transfer of Care Note  Patient: [name]  Procedure(s) Performed: Procedure(s): LEFT SHOULDER ARTHROSCOPY WITH SUBACROMIAL DECOMPRESSION,DISTAL CLAVICAL RESECTION  ROTATOR CUFF REPAIR  (Left)  Patient Location: PACU  Anesthesia Type:GA combined with regional for post-op pain  Level of Consciousness: awake, alert  and oriented  Airway & Oxygen Therapy: Patient Spontanous Breathing and Patient connected to face mask oxygen  Post-op Assessment: Report given to RN, Post -op Vital signs reviewed and stable and Patient moving all extremities  Post vital signs: Reviewed and stable  Last Vitals:  Filed Vitals:   05/20/15 0610 05/20/15 0635  BP: 140/63   Pulse: 80   Temp:  37.3 C  Resp: 18     Complications: No apparent anesthesia complications

## 2015-05-20 NOTE — Anesthesia Preprocedure Evaluation (Addendum)
Anesthesia Evaluation  Patient identified by MRN, date of birth, ID band Patient awake    Reviewed: Allergy & Precautions, NPO status , Patient's Chart, lab work & pertinent test results  History of Anesthesia Complications Negative for: history of anesthetic complications  Airway Mallampati: III  TM Distance: >3 FB Neck ROM: Full    Dental  (+) Teeth Intact   Pulmonary neg shortness of breath, sleep apnea and Continuous Positive Airway Pressure Ventilation , neg COPD, former smoker,    breath sounds clear to auscultation       Cardiovascular hypertension, Pt. on medications (-) angina+ Peripheral Vascular Disease  (-) Past MI and (-) CHF  Rhythm:Regular     Neuro/Psych neg Seizures Subdural hematoma s/p crani negative psych ROS   GI/Hepatic negative GI ROS, Neg liver ROS,   Endo/Other  diabetes, Type 2, Insulin DependentMorbid obesity  Renal/GU negative Renal ROS     Musculoskeletal   Abdominal   Peds  Hematology negative hematology ROS (+)   Anesthesia Other Findings   Reproductive/Obstetrics                           Anesthesia Physical Anesthesia Plan  ASA: III  Anesthesia Plan: General and Regional   Post-op Pain Management:    Induction: Intravenous  Airway Management Planned: Oral ETT  Additional Equipment: None  Intra-op Plan:   Post-operative Plan: Extubation in OR  Informed Consent: I have reviewed the patients History and Physical, chart, labs and discussed the procedure including the risks, benefits and alternatives for the proposed anesthesia with the patient or authorized representative who has indicated his/her understanding and acceptance.   Dental advisory given  Plan Discussed with: CRNA and Surgeon  Anesthesia Plan Comments:         Anesthesia Quick Evaluation

## 2015-05-20 NOTE — Consult Note (Signed)
Requesting physician: Dr Lennette Bihari supple  Reason for consultation: Chest pain postoperatively  and management of medical issues  History of Present Illness: 65 year old obese male with history of diabetes mellitus on insulin, hypertension, history of DVT, peripheral vascular disease, OSA on CPAP with persistent left shoulder rotator cuff tear with impingement for the past 8 months was admitted for surgery with left shoulder degenerative joint arthroscopy, labral debridement, extensive synovectomy with lysis of adhesions, arthroscopic subacromial decompression and bursectomy, arthroscopic distal clavicular resection and rotator cuff repair. Postoperatively on the floor patient complained of shortness of breath and chest heaviness. He reports he had like a sandbag was placed on his chest. He felt bloated on his stomach. Denies radiation of pain. Rapid response was called. Vitals were stable. O2 sat was stable on room air. Placed on nasal cannula. Patient started to burp and symptoms resolved. Denies similar symptoms in the past. Denies history of heart disease or anginal symptoms. Denies headache, blurred vision, dizziness, nausea, vomiting, abdominal pain, palpitations, bowel or urinary symptoms. Hospitalist consulted for evaluation and assist in management of his underlying medical problems.  Allergies:   Allergies  Allergen Reactions  . Peanuts [Peanut Oil] Shortness Of Breath    Swelling throat, sob   . Tamiflu [Oseltamivir Phosphate] Shortness Of Breath  . Gabapentin Swelling  . Lyrica [Pregabalin] Swelling  . Statins Other (See Comments)    Leg problems.  . Avelox [Moxifloxacin Hcl In Nacl] Rash      Past Medical History  Diagnosis Date  . Diverticulitis   . Diabetes mellitus   . Subdural hematoma ([place])   . Hypertension   . Clotting disorder ([place])     DVT in 2006 x1; no problems since  . Cataract   . Peripheral vascular disease ([place]) 2006    dvt 's  . Sleep apnea     cpap  started 2 months ago set on 4  . Pneumonia 06    Past Surgical History  Procedure Laterality Date  . Tonsillectomy    . Brain surgery  2010    subdural hematoma -bleed  . Dg thumb left hand Left     "gamers "  . Eye surgery Right 12/16    cataract  . Rotator cuff repair w/ distal clavicle excision Left 05/20/2015    Medications:  Scheduled Meds: . amLODipine  10 mg Oral Daily  . docusate sodium  100 mg Oral BID  . insulin aspart  0-20 Units Subcutaneous TID WC  . insulin aspart  0-5 Units Subcutaneous QHS  . ketorolac  7.5 mg Intravenous 4 times per day  . terazosin  5 mg Oral QHS   Continuous Infusions: . lactated ringers 75 mL/hr at 05/20/15 1232   PRN Meds:.acetaminophen **OR** acetaminophen, alum & mag hydroxide-simeth, bisacodyl, diazepam, diphenhydrAMINE, HYDROmorphone (DILAUDID) injection, menthol-cetylpyridinium **OR** phenol, metoCLOPramide **OR** metoCLOPramide (REGLAN) injection, ondansetron **OR** ondansetron (ZOFRAN) IV, oxyCODONE, oxyCODONE-acetaminophen, polyethylene glycol, promethazine **OR** promethazine, sodium phosphate  Social History:  reports that he quit smoking about 34 years ago. His smoking use included Cigarettes. He has a 15 pack-year smoking history. He has never used smokeless tobacco. He reports that he drinks about 6.0 oz of alcohol per week. He reports that he does not use illicit drugs.  Family History  Problem Relation Age of Onset  . Stroke Father   . Diabetes Brother     Review of Systems:  As outlined in history of present illness. 12 point review of systems otherwise unremarkable.  Physical Exam:  Danley Danker  Vitals:   05/20/15 1030 05/20/15 1115 05/20/15 1549 05/20/15 1607  BP:  129/67 142/76 144/75  Pulse: 67 77 85 85  Temp:  97.7 F (36.5 C)    TempSrc:  Oral    Resp: 17 15 18    Height:      Weight:      SpO2: 97% 98% 100% 99%     Intake/Output Summary (Last 24 hours) at 05/20/15 1753 Last data filed at 05/20/15 1403   Gross per 24 hour  Intake   1380 ml  Output     25 ml  Net   1355 ml    General: He aged obese male not in distress HEENT: No pallor, moist mucosa, supple neck Heart: Normal S1 and S2, no murmurs rub or gallop Lungs: Clear to auscultation bilaterally. Abdomen: Soft, nontender, nondistended, positive bowel sounds. Extremities: Warm, no edema, dressing over left shoulder repair Neuro: Alert and oriented  Labs on Admission:  CBC:    Component Value Date/Time   WBC 10.1 05/07/2015 1540   WBC 11.9* 01/29/2014 2119   HGB 14.5 05/07/2015 1540   HGB 14.4 01/29/2014 2119   HCT 44.9 05/07/2015 1540   HCT 45.2 01/29/2014 2119   PLT 273 05/07/2015 1540   MCV 83.8 05/07/2015 1540   MCV 79.4* 01/29/2014 2119   NEUTROABS 6.5 05/07/2015 1540   LYMPHSABS 2.6 05/07/2015 1540   MONOABS 0.7 05/07/2015 1540   EOSABS 0.2 05/07/2015 1540   BASOSABS 0.1 05/07/2015 [id number]    Basic Metabolic Panel:    Component Value Date/Time   NA 139 05/07/2015 1540   K 4.2 05/07/2015 1540   CL 103 05/07/2015 1540   CO2 24 05/07/2015 1540   BUN 12 05/07/2015 1540   CREATININE 0.85 05/07/2015 1540   GLUCOSE 180* 05/07/2015 1540   CALCIUM 9.2 05/07/2015 1540    Radiological Exams on Admission: Chest x-ray ordered  EKG: Normal sinus rhythm at 83, no ST-T changes  Assessment/Plan Chest heaviness with shortness of breath I suspect this is likely due to gastric bloating and dyspepsia. Symptoms improved after receiving Maalox and patient burped out. -Will check CBC, basic metabolic panel and a portable chest x-ray. EKG done showed normal sinus rhythm with no ST-T changes. Since symptoms resolved after receiving Maalox I strongly doubt cardiac etiology. Will not pursue cardiac workup at this time. -I will place him on when necessary Maalox.  Essential hypertension Continue amlodipine  Type 2 diabetes mellitus I will resume his home dose Lantus. He is also on pre-meal aspart (12 units 3 times a day). I'll  start him at 4 units 3 times a day. Continue sliding scale coverage.  Rotator cuff tear status post repair per primary team. Patient denies any pain.  BPH Continue Hytrin.  Diet: Diabetic  DVT prophylaxis: Early ambulation.  Thank you for the consult. Will follow.  Time Spent on Admission: 50 minutes  Jaimin Krupka 05/20/2015, 5:53 PM

## 2015-05-20 NOTE — Progress Notes (Signed)
While on unit with another patient was informed of this patient c/o chest pain and pressure - like "bricks on my chest."  Staff getting 12 lead EKG.  On my arrival to room patient supine in bed - skin warm and dry - alert - no distress - color pink - resps reg and unlabored bil BS present clear - DB without increased pain - denies SOB - nausea or diaphoresis.  12 lead noted - without acute changes from pre-op 12 lead.  Patient states this started around 12 noon after he ate lunch.  He is s/p left shoulder surgery this AM.  Abd large and soft - states he feels a little bloated.  O2 at 2 liter nasal cannula - sats 100%.  Bp 144/75 HR 74 RR 16 and unlabored.   He reports hx of "dyspnea" without true cause dx - this feels like his typical event - states he takes Ativan for relief.  Uses cpap at home - encourged staff to request order for nighttime cpap.  NAD noted - RN Korie to alert attending for further orders.  To call as needed.

## 2015-05-20 NOTE — Anesthesia Procedure Notes (Addendum)
Anesthesia Regional Block:  Interscalene brachial plexus block  Pre-Anesthetic Checklist: ,, timeout performed, Correct Patient, Correct Site, Correct Laterality, Correct Procedure, Correct Position, site marked, Risks and benefits discussed,  Surgical consent,  Pre-op evaluation,  At surgeon's request and post-op pain management  Laterality: Upper and Left  Prep: chloraprep       Needles:  Injection technique: Single-shot  Needle Type: Echogenic Stimulator Needle          Additional Needles:  Procedures: ultrasound guided (picture in chart) Interscalene brachial plexus block Narrative:  Injection made incrementally with aspirations every 5 mL.  Performed by: Personally  Anesthesiologist: Oleta Mouse  Additional Notes: H+P and labs reviewed, risks and benefits discussed with patient, procedure tolerated well without complications   Procedure Name: Intubation Date/Time: 05/20/2015 7:37 AM Performed by: Rush Farmer E Pre-anesthesia Checklist: Patient identified, Emergency Drugs available, Suction available, Patient being monitored and Timeout performed Patient Re-evaluated:Patient Re-evaluated prior to inductionOxygen Delivery Method: Circle system utilized Preoxygenation: Pre-oxygenation with 100% oxygen Intubation Type: IV induction Ventilation: Mask ventilation without difficulty and Two handed mask ventilation required Laryngoscope Size: Miller and 3 Grade View: Grade II Tube type: Oral Tube size: 7.5 mm Number of attempts: 2 Airway Equipment and Method: Stylet Placement Confirmation: ETT inserted through vocal cords under direct vision,  positive ETCO2 and breath sounds checked- equal and bilateral Secured at: 23 cm Tube secured with: Tape Dental Injury: Teeth and Oropharynx as per pre-operative assessment

## 2015-05-20 NOTE — Progress Notes (Addendum)
Around 3:45 pm, RN entered pt's room to give PRN pain medication. He had just ambulated back from the bathroom a few minutes prior, and stated he felt like he couldn't catch his breath. This started around noon he reported. RN asked why he did not report it sooner and he said he thought it would get better, but when it didn't thought "he better mention it." He did complain of chest pain also. 2L O2 applied, other vitals stable (see flowsheets), lungs diminished. 12 lead EKG completed and Rapid Response RN notified. Chest pain progressively worsened to what he stated "felt like a ton of bricks on his chest." Olivia Mackie PA made aware of situation. Will continue to closely monitor pt.    By 6pm, pt states he is feeling "much better because he belched."  Raquel James  05/20/2015

## 2015-05-21 ENCOUNTER — Encounter (HOSPITAL_COMMUNITY): Payer: Self-pay | Admitting: Orthopedic Surgery

## 2015-05-21 DIAGNOSIS — I10 Essential (primary) hypertension: Secondary | ICD-10-CM | POA: Diagnosis not present

## 2015-05-21 DIAGNOSIS — M7542 Impingement syndrome of left shoulder: Secondary | ICD-10-CM | POA: Diagnosis not present

## 2015-05-21 DIAGNOSIS — R0989 Other specified symptoms and signs involving the circulatory and respiratory systems: Secondary | ICD-10-CM | POA: Diagnosis not present

## 2015-05-21 DIAGNOSIS — R1013 Epigastric pain: Secondary | ICD-10-CM | POA: Diagnosis not present

## 2015-05-21 LAB — CBC
HCT: 40.3 % (ref 39.0–52.0)
HEMOGLOBIN: 12.8 g/dL — AB (ref 13.0–17.0)
MCH: 26.8 pg (ref 26.0–34.0)
MCHC: 31.8 g/dL (ref 30.0–36.0)
MCV: 84.5 fL (ref 78.0–100.0)
PLATELETS: 261 10*3/uL (ref 150–400)
RBC: 4.77 MIL/uL (ref 4.22–5.81)
RDW: 14.7 % (ref 11.5–15.5)
WBC: 11.6 10*3/uL — ABNORMAL HIGH (ref 4.0–10.5)

## 2015-05-21 LAB — GLUCOSE, CAPILLARY
GLUCOSE-CAPILLARY: 143 mg/dL — AB (ref 65–99)
GLUCOSE-CAPILLARY: 153 mg/dL — AB (ref 65–99)

## 2015-05-21 MED ORDER — HYDROMORPHONE HCL 2 MG PO TABS: 2.0000 mg | ORAL_TABLET | ORAL | Status: DC | PRN

## 2015-05-21 MED ORDER — OXYCODONE-ACETAMINOPHEN 10-325 MG PO TABS: 1.0000 | ORAL_TABLET | ORAL | Status: DC | PRN

## 2015-05-21 MED ORDER — NAPROXEN 500 MG PO TABS: 500.0000 mg | ORAL_TABLET | Freq: Two times a day (BID) | ORAL | Status: DC

## 2015-05-21 MED ORDER — FUROSEMIDE 10 MG/ML IJ SOLN
40.0000 mg | Freq: Once | INTRAMUSCULAR | Status: AC
  Administered 2015-05-21: 40 mg via INTRAVENOUS
  Filled 2015-05-21: qty 4

## 2015-05-21 MED ORDER — DIAZEPAM 5 MG PO TABS: 2.5000 mg | ORAL_TABLET | Freq: Four times a day (QID) | ORAL | Status: DC | PRN

## 2015-05-21 MED ORDER — ONDANSETRON HCL 4 MG PO TABS: 4.0000 mg | ORAL_TABLET | Freq: Three times a day (TID) | ORAL | Status: DC | PRN

## 2015-05-21 NOTE — Progress Notes (Signed)
[facility] PROGRESS NOTE  Viral Paneto [id number] DOB: 01/24/1951 DOA: 05/20/2015 PCP: Dwan Bolt, MD  Assessment/Plan: Chest heaviness with shortness of breath CXR showed some pulmonary vascular congestion. sats stable on RA. Has some bibasilar cracles on exam this am. Will order lasix 40 mg IV x 1 dose. -labs stable. EKG done showed normal sinus rhythm with no ST-T changes.  continue prn maalox.  -ok for discharge medically.  Essential hypertension Continue amlodipine  Type 2 diabetes mellitus Resume home dose lantus and premeal coverage.  Rotator cuff tear status post repair per primary team. Patient denies any pain.  BPH Continue Hytrin.  Nausea and vomiting Could be due to dyspepsia or narcotics. asymptomatic at present. Exam benign. getting prn phenergan.  Will sign off. Please call for any questions.        Procedures:  Rt rotator cuff repair  Antibiotics:  none  HPI/Subjective: Pt vomited x1 this am. Denies any abdominal bloating or nuasea now. Reports shortness of breath to be better. cxr showed some pulm vascular congestion.   Objective: Filed Vitals:   05/21/15 0100 05/21/15 0500  BP: 134/64 107/54  Pulse: 90 80  Temp: 98 F (36.7 C) 98.4 F (36.9 C)  Resp: 18 18    Intake/Output Summary (Last 24 hours) at 05/21/15 0910 Last data filed at 05/21/15 0618  Gross per 24 hour  Intake 2761.25 ml  Output     25 ml  Net 2736.25 ml   Filed Weights   05/20/15 0610  Weight: 134.355 kg (296 lb 3.2 oz)    Exam:  General:  not in distress HEENT:  moist mucosa, supple neck Heart: Normal S1 and S2, no murmurs rub or gallop Lungs: fine bibasilar crackles Abdomen: Soft, nontender, nondistended, positive bowel sounds. Extremities: Warm, no edema, dressing over left shoulder repair Data Reviewed: Basic Metabolic Panel:  Recent Labs Lab 05/20/15 1905  NA 139  K 4.2  CL 104  CO2 26  GLUCOSE 192*  BUN 10  CREATININE 0.91   CALCIUM 8.7*   Liver Function Tests: No results for input(s): AST, ALT, ALKPHOS, BILITOT, PROT, ALBUMIN in the last 168 hours. No results for input(s): LIPASE, AMYLASE in the last 168 hours. No results for input(s): AMMONIA in the last 168 hours. CBC:  Recent Labs Lab 05/21/15 0400  WBC 11.6*  HGB 12.8*  HCT 40.3  MCV 84.5  PLT 261   Cardiac Enzymes: No results for input(s): CKTOTAL, CKMB, CKMBINDEX, TROPONINI in the last 168 hours. BNP (last 3 results) No results for input(s): BNP in the last 8760 hours.  ProBNP (last 3 results) No results for input(s): PROBNP in the last 8760 hours.  CBG:  Recent Labs Lab 05/20/15 1007 05/20/15 1149 05/20/15 1712 05/20/15 2159 05/21/15 0642  GLUCAP 118* 109* 150* 155* 143*    No results found for this or any previous visit (from the past 240 hour(s)).   Studies: Dg Chest Port 1 View  05/20/2015  CLINICAL DATA:  Chest pain and shortness of breath after left rotator cuff repair today. EXAM: PORTABLE CHEST 1 VIEW COMPARISON:  01/29/2014 FINDINGS: The cardiac silhouette appears mildly enlarged. The thoracic aorta is mildly tortuous with calcification noted. There is mild central pulmonary vascular congestion with mild diffuse interstitial prominence. Mild airspace opacity is present in the left lung base. A trace left pleural effusion is not excluded. Thoracic spondylosis is noted. IMPRESSION: 1. Mild pulmonary vascular congestion with mild diffuse interstitial prominence. Early interstitial edema is questioned. 2. Left basilar  opacity suggestive of atelectasis. Electronically Signed   By: Logan Bores M.D.   On: 05/20/2015 19:09    Scheduled Meds: . amLODipine  10 mg Oral Daily  . docusate sodium  100 mg Oral BID  . insulin aspart  0-20 Units Subcutaneous TID WC  . insulin aspart  0-5 Units Subcutaneous QHS  . insulin aspart  4 Units Subcutaneous TID WC  . insulin glargine  30 Units Subcutaneous BID  . terazosin  5 mg Oral QHS    Continuous Infusions: . lactated ringers 75 mL/hr at 05/20/15 1232     Time spent: 25 minutes    Vishaal Strollo  [facility] Pager [phone]. If 7PM-7AM, please contact night-coverage at [email or web address], password [facility] 05/21/2015, 9:10 AM

## 2015-05-21 NOTE — Anesthesia Postprocedure Evaluation (Signed)
Anesthesia Post Note  Patient: [name]  Procedure(s) Performed: Procedure(s) (LRB): LEFT SHOULDER ARTHROSCOPY WITH SUBACROMIAL DECOMPRESSION,DISTAL CLAVICAL RESECTION  ROTATOR CUFF REPAIR  (Left)  Patient location during evaluation: PACU Anesthesia Type: General and Regional Level of consciousness: awake Pain management: pain level controlled Vital Signs Assessment: post-procedure vital signs reviewed and stable Respiratory status: spontaneous breathing and respiratory function stable Cardiovascular status: stable Postop Assessment: no signs of nausea or vomiting Anesthetic complications: no    Last Vitals:  Filed Vitals:   05/21/15 0100 05/21/15 0500  BP: 134/64 107/54  Pulse: 90 80  Temp: 36.7 C 36.9 C  Resp: 18 18    Last Pain:  Filed Vitals:   05/21/15 0904  PainSc: 4                  Rolla Servidio

## 2015-05-21 NOTE — Discharge Instructions (Signed)
° °  Kevin M. Supple, M.D., F.A.A.O.S. °Orthopaedic Surgery °Specializing in Arthroscopic and Reconstructive °Surgery of the Shoulder and Knee °[phone] °[address] - [place],  [address] - Fax [phone] ° °POST-OP SHOULDER ARTHROSCOPIC ROTATOR CUFF AND/OR LABRAL REPAIR INSTRUCTIONS ° °1. Call the office at [phone] to schedule your first post-op appointment 7-10 days from the date of your surgery. ° °2. Leave the steri-strips in place over your incisions when performing dressing changes and showering. You may remove your dressings and begin showering 72 hours from surgery. You can expect drainage that is clear to bloody in nature that occasionally will soak through your dressings. If this occurs go ahead and perform a dressing change. The drainage should lessen daily and when there is no drainage from your incisions feel free to go without a dressing. ° °3. Wear your sling/immobilizer at all times except to perform the exercises below or to occasionally let your arm dangle by your side to stretch your elbow. You also need to sleep in your sling immobilizer until instructed otherwise. ° °4. Range of motion to your elbow, wrist, and hand are encouraged 3-5 times daily. Exercise to your hand and fingers helps to reduce swelling you may experience. ° °5. Utilize ice to the shoulder 3-4 times minimum a day and additionally if you are experiencing pain. ° °6. You may one-armed drive when safely off of narcotics and muscle relaxants. You may use your hand that is in the sling to support the steering wheel only. However, should it be your right arm that is in the sling it is not to be used for gear shifting in a manual transmission. ° °7. If you had a block pre-operatively to provide post-op pain relief you may want to go ahead and begin utilizing your pain meds as your arm begins to wake up. Blocks can sometimes last up to 16-18 hours. If you are still pain-free prior to going to bed you may  want to strongly consider taking a pain medication to avoid being awakened in the night with the onset of pain. A muscle relaxant is also provided for you should you experience muscle spasms. It is recommended that if you are experiencing pain that your pain medication alone is not controlling, add the muscle relaxant along with the pain medication which can give additional pain relief. The first one to two days is generally the most severe of your pain and then should gradually decrease. As your pain lessens it is recommended that you decrease your use of the pain medications to an "as needed basis" only and to always comply with the recommended dosages of the pain medications. ° °8. Pain medications can produce constipation along with their use. If you experience this, the use of an over the counter stool softener or laxative daily is recommended.  ° °9. For additional questions or concerns, please do not hesitate to call the office. If after hours there is an answering service to forward your concerns to the physician on call. ° °POST-OP EXERCISES ° °Pendulum Exercises ° °Perform pendulum exercises while standing and bending at the waist. Support your uninvolved arm on a table or chair and allow your operated arm to hang freely. Make sure to do these exercises passively - not using you shoulder muscle. ° °Repeat 20 times. Do 3 sessions per day. ° ° °

## 2015-05-21 NOTE — Discharge Summary (Signed)
PATIENT ID:      [name]  MRN:     [id number] DOB/AGE:    October 02, 1950 / 65 y.o.     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 Diverticulitis   . Diabetes mellitus   . 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 Hypertension   . Clotting disorder ([place])     DVT in 2006 x1; no problems since  . Cataract   . Peripheral vascular disease ([place]) 2006    dvt 's  . Sleep apnea     cpap started 2 months ago set on 4  . Pneumonia 06    DISCHARGE DIAGNOSIS:   Active Problems:   S/P rotator cuff repair   Shortness of breath   Essential hypertension, benign   Dyspepsia   Type 2 diabetes mellitus with hyperglycemia, with long-term current use of insulin (HCC)   PROCEDURE: Procedure(s): LEFT SHOULDER ARTHROSCOPY WITH SUBACROMIAL DECOMPRESSION,DISTAL CLAVICAL RESECTION  ROTATOR CUFF REPAIR  on June 19, 2015  CONSULTS: Treatment Team:  Marcheta Grammes, MD Louellen Molder, MD   HISTORY:  See H&P in chart.  HOSPITAL COURSE:  [name] is a 65 y.o. admitted on 2015-06-19 with a diagnosis of LEFT SHOULDER PARTIAL VERSUS FULL ROTATOR CUFF TEAR WITH AC OA .  They were brought to the operating room on 06-19-2015 and underwent Procedure(s): LEFT SHOULDER ARTHROSCOPY WITH SUBACROMIAL DECOMPRESSION,DISTAL CLAVICAL RESECTION  ROTATOR CUFF REPAIR .    They were given perioperative antibiotics: Anti-infectives    Start     Dose/Rate Route Frequency Ordered Stop   06/19/15 0600  ceFAZolin (ANCEF) 3 g in dextrose 5 % 50 mL IVPB     3 g 130 mL/hr over 30 Minutes Intravenous To ShortStay Surgical 05/19/15 1352 19-Jun-2015 0740    .  Patient underwent the above named procedure and tolerated it well. The following day they were hemodynamically stable and pain was controlled on oral analgesics. They were neurovascularly intact to the operative extremity. OT was ordered and  worked with patient per protocol. They were medically and orthopaedically stable for discharge on day 1. Home health was arranged due to his home situation    DIAGNOSTIC STUDIES:  RECENT RADIOGRAPHIC STUDIES :  Dg Chest Port 1 View  06-19-2015  CLINICAL DATA:  Chest pain and shortness of breath after left rotator cuff repair today. EXAM: PORTABLE CHEST 1 VIEW COMPARISON:  01/29/2014 FINDINGS: The cardiac silhouette appears mildly enlarged. The thoracic aorta is mildly tortuous with calcification noted. There is mild central pulmonary vascular congestion with mild diffuse interstitial prominence. Mild airspace opacity is present in the left lung base. A trace left pleural effusion is not excluded. Thoracic spondylosis is noted. IMPRESSION: 1. Mild pulmonary vascular congestion with mild diffuse interstitial prominence. Early interstitial edema is questioned. 2. Left basilar opacity suggestive of atelectasis. Electronically Signed   By: Logan Bores M.D.   On: 06/19/15 19:09    RECENT VITAL SIGNS:  Patient Vitals for the past 24 hrs:  BP Temp Temp src Pulse Resp SpO2  05/21/15 0500 (!) 107/54 mmHg 98.4 F (36.9 C) Oral 80 18 96 %  05/21/15 0100 134/64 mmHg 98 F (36.7 C) Oral 90 18 100 %  2015/06/19 2346 - - - 90 18 96 %  06-19-2015 2100 138/68 mmHg 98 F (36.7 C) Oral 91 18 96 %  June 19, 2015 1607 (!)  144/75 mmHg - - 85 - 99 %  05/20/15 1549 (!) 142/76 mmHg - - 85 18 100 %  05/20/15 1115 129/67 mmHg 97.7 F (36.5 C) Oral 77 15 98 %  05/20/15 1030 - - - 67 17 97 %  05/20/15 1022 124/65 mmHg - - 73 15 96 %  05/20/15 1015 - 97.8 F (36.6 C) - 71 13 99 %  05/20/15 1007 112/67 mmHg - - 70 17 97 %  05/20/15 1000 - - - 73 16 96 %  05/20/15 0952 116/65 mmHg - - 68 19 96 %  05/20/15 0945 - - - 74 17 96 %  05/20/15 0935 121/66 mmHg 98.2 F (36.8 C) - 76 16 99 %  .  RECENT EKG RESULTS:    Orders placed or performed during the hospital encounter of 05/20/15  . EKG 12-Lead  . EKG 12-Lead     DISCHARGE INSTRUCTIONS:  Discharge Instructions    Change dressing    Complete by:  As directed      Discontinue IV    Complete by:  As directed            DISCHARGE MEDICATIONS:     Medication List    TAKE these medications        amLODipine 10 MG tablet  Commonly known as:  NORVASC  Take 10 mg by mouth daily.     aspirin 325 MG EC tablet  Take 325 mg by mouth daily.     diazepam 5 MG tablet  Commonly known as:  VALIUM  Take 0.5-1 tablets (2.5-5 mg total) by mouth every 6 (six) hours as needed for muscle spasms or sedation.     FISH OIL PO  Take 1 capsule by mouth daily.     HYDROmorphone 2 MG tablet  Commonly known as:  DILAUDID  Take 1-2 tablets (2-4 mg total) by mouth every 4 (four) hours as needed for severe pain.     insulin aspart 100 UNIT/ML injection  Commonly known as:  novoLOG  Inject 12 Units into the skin 3 (three) times daily with meals.     insulin glargine 100 UNIT/ML injection  Commonly known as:  LANTUS  Inject 30 Units into the skin 2 (two) times daily.     magnesium oxide 400 MG tablet  Commonly known as:  MAG-OX  Take 400 mg by mouth at bedtime.     multivitamin with minerals Tabs tablet  Take 1 tablet by mouth daily.     naproxen 500 MG tablet  Commonly known as:  NAPROSYN  Take 1 tablet (500 mg total) by mouth 2 (two) times daily with a meal.     ondansetron 4 MG tablet  Commonly known as:  ZOFRAN  Take 1 tablet (4 mg total) by mouth every 8 (eight) hours as needed for nausea or vomiting.     oxyCODONE-acetaminophen 10-325 MG tablet  Commonly known as:  PERCOCET  Take 1 tablet by mouth every 4 (four) hours as needed for pain.     potassium gluconate 595 (99 K) MG Tabs tablet  Take 595 mg by mouth daily.     terazosin 5 MG capsule  Commonly known as:  HYTRIN  Take 5 mg by mouth at bedtime.     Vitamin D (Ergocalciferol) 50000 units Caps capsule  Commonly known as:  DRISDOL  Take 50,000 Units by mouth 3 (three) times a  week. MON WED FRI        FOLLOW UP VISIT:  Follow-up Information    Follow up with Metta Clines SUPPLE, MD.   Specialty:  Orthopedic Surgery   Why:  call to be seen in 7-10 days   Contact information:   [address] [place] [address] [id number]       DISCHARGE TO: Home   DISPOSITION: Good  DISCHARGE CONDITION:  Festus Barren for Dr. Justice Britain 05/21/2015, 8:32 AM

## 2015-05-21 NOTE — Progress Notes (Signed)
Pt ready for discharge. Education/instructions reviewed with pt and all questions/concerns addressed. IV removed and belongings gathered. Pt will be transported out via wheelchair to daughter's car. Will continue to monitor

## 2015-05-21 NOTE — Progress Notes (Signed)
PT Cancellation Note  Patient Details Name: Nickson Dimare MRN: [id number] DOB: 1950-11-30   Cancelled Treatment:    Reason Eval/Treat Not Completed: PT screened, no needs identified, will sign off.    Cassell Clement, PT, CSCS Pager [phone] Office [phone]  05/21/2015, 11:54 AM

## 2015-05-21 NOTE — Evaluation (Signed)
Occupational Therapy Evaluation Patient Details Name: [name] MRN: [id number] DOB: Aug 18, 1950 Today's Date: 05/21/2015    History of Present Illness 65 year old obese male with history of diabetes mellitus on insulin, hypertension, history of DVT, peripheral vascular disease, OSA on CPAP with persistent left shoulder rotator cuff tear with impingement for the past 8 months was admitted for surgery with left shoulder degenerative joint arthroscopy, labral debridement, extensive synovectomy with lysis of adhesions, arthroscopic subacromial decompression and bursectomy, arthroscopic distal clavicular resection and rotator cuff repair.   Clinical Impression   Patient admitted with above. Patient independent PTA. Patient currently functioning at an overall independent to occasional min assist level due to above.  No additional OT needs identified, D/C from acute OT services and OPOT recommended when MD clears and finds appropriate for patient. All appropriate education provided to patient. Please re-order OT if needed.    Therapist thoroughly educated pt on shoulder protocol, restrictions, precautions, and exercises. Administered shoulder protocol handout AND pendulum exercise handout. Pt independent to mod I with functional mobility, but does require up to min assist with ADLs due to shoulder immobilization at this time. Recommended patient's daughter help prn, currently patient's daughter is main caregiver for patient's wife who has parkinson's disease.     Follow Up Recommendations  Outpatient OT;Supervision - Intermittent (OPOT once MD deems appropriate)    Equipment Recommendations  None recommended by OT    Recommendations for Other Services  None at this time    Precautions / Restrictions Precautions Precautions: Fall Precaution Comments: no AROM to shoulder, AROM to elbow, wrist, hand OK, pendulums OK, FF=90*, ER-30*, Abd=60*, sling can be doffed in controlled  environment Restrictions Weight Bearing Restrictions: Yes LUE Weight Bearing: Non weight bearing     Mobility Bed Mobility Overal bed mobility: Modified Independent General bed mobility comments: increased time, HOB slightly raised and use of bed rails   Transfers Overall transfer level: Modified independent General transfer comment: increased time needed    Balance Overall balance assessment: No apparent balance deficits (not formally assessed);Independent    ADL Overall ADL's : Needs assistance/impaired General ADL Comments: Pt requires overall supervision to occassional min assist with ADLs due to recent L shoulder surgery. Encouraged pt to have family present when he showers for his safety. Went over dressing technique to ensure no active shoulder movement.     Vision Vision Assessment?: No apparent visual deficits          Pertinent Vitals/Pain Pain Assessment: Faces Faces Pain Scale: Hurts even more Pain Location: LUE Pain Descriptors / Indicators: Aching;Sore Pain Intervention(s): Limited activity within patient's tolerance;Monitored during session;Repositioned     Hand Dominance Left ("both")   Extremity/Trunk Assessment Upper Extremity Assessment Upper Extremity Assessment: LUE deficits/detail;Overall WFL for tasks assessed LUE Deficits / Details: decreased ROM and mobility secondary to recent surgery   Lower Extremity Assessment Lower Extremity Assessment: Defer to PT evaluation   Cervical / Trunk Assessment Cervical / Trunk Assessment: Normal   Communication Communication Communication: No difficulties   Cognition Arousal/Alertness: Awake/alert Behavior During Therapy: WFL for tasks assessed/performed Overall Cognitive Status: Within Functional Limits for tasks assessed      Exercises  Shoulder exercises, see epic tab for more information    Shoulder Instructions Shoulder Instructions Donning/doffing shirt without moving shoulder:  Supervision/safety Method for sponge bathing under operated UE: Modified independent Donning/doffing sling/immobilizer: Minimal assistance Correct positioning of sling/immobilizer: Independent Pendulum exercises (written home exercise program): Modified independent (handout) ROM for elbow, wrist and digits of operated UE:  Independent Sling wearing schedule (on at all times/off for ADL's): Independent Proper positioning of operated UE when showering: Independent Dressing change:  (n/a, nursing material) Positioning of UE while sleeping: [place] expects to be discharged to:: Private residence Living Arrangements: Spouse/significant other;Children Available Help at Discharge: Family;Available PRN/intermittently Type of Home: House Home Access: Stairs to enter [employer] of Steps: 2   Home Layout: One level     Bathroom Shower/Tub: Walk-in shower;Curtain   Bathroom Toilet: Handicapped height     Home Equipment: Hand held shower head;Shower seat   Additional Comments: Wife has parkinson's, their daughter is her caregiver      Prior Functioning/Environment Level of Independence: Independent     OT Diagnosis: Generalized weakness;Acute pain   OT Problem List:   N/a, no acute OT needs identified at this time     OT Treatment/Interventions:   N/a, no acute OT needs identified at this time     OT Goals(Current goals can be found in the care plan section) Acute Rehab OT Goals Patient Stated Goal: go home today OT Goal Formulation: All assessment and education complete, DC therapy (defer needs to OPOT once pt appropriate)  OT Frequency:   N/a, no acute OT needs identified at this time     Barriers to D/C:  none known at this time    End of Session Activity Tolerance: Patient tolerated treatment well Patient left: in bed;with call bell/phone within reach   Time: 0822-0855 OT Time Calculation (min): 33 min Charges:  OT General  Charges $OT Visit: 1 Procedure OT Evaluation $OT Eval Moderate Complexity: 1 Procedure OT Treatments $Therapeutic Exercise: 8-22 mins G-Codes: OT G-codes **NOT FOR INPATIENT CLASS** Functional Limitation: Self care Self Care Current Status [id number]): At least 1 percent but less than 20 percent impaired, limited or restricted Self Care Goal Status [id number]): At least 1 percent but less than 20 percent impaired, limited or restricted Self Care Discharge Status [phone]): At least 1 percent but less than 20 percent impaired, limited or restricted  Chrys Racer , MS, OTR/L, CLT  Pager: [phone]  05/21/2015, 9:34 AM

## 2015-05-21 NOTE — Care Management Note (Signed)
Case Management Note  Patient Details  Name: [name] MRN: [id number] Date of Birth: 1950/09/16  Subjective/Objective:      65 yr old gentleman s/p left clavicle resection              Action/Plan:  Case manager spoke with patient concerning discharge needs. Patient is under [facility] with [facility]. CM contacted Tylene Fantasia, SW with [facility]. Donny Pique stated that patient will have to call [facility] to arrange to use his therapy benefits as outpatient. Case manager provided patient with number-[phone]. He states he is familiar with using choice care. His daughter will assist him at discharge. Patient states his wife has Parkinson's and he is her care provider.   Expected Discharge Date:    05/21/15              Expected Discharge Plan:   Home/self Care  In-House Referral:     Discharge planning Services     Post Acute Care Choice:    Choice offered to:     DME Arranged:   NA DME Agency:     HH Arranged:   NA HH Agency:     Status of Service:    Completed Medicare Important Message Given:    Date Medicare IM Given:    Medicare IM give by:    Date Additional Medicare IM Given:    Additional Medicare Important Message give by:     If discussed at [place] of Stay Meetings, dates discussed:    Additional Comments:  Ninfa Meeker, RN 05/21/2015, 11:04 AM

## 2015-06-14 ENCOUNTER — Encounter (HOSPITAL_COMMUNITY): Payer: Self-pay | Admitting: Orthopedic Surgery

## 2017-09-05 ENCOUNTER — Other Ambulatory Visit: Payer: Self-pay | Admitting: Family Medicine

## 2017-09-05 DIAGNOSIS — K118 Other diseases of salivary glands: Secondary | ICD-10-CM

## 2017-10-10 DIAGNOSIS — K118 Other diseases of salivary glands: Secondary | ICD-10-CM

## 2017-10-10 HISTORY — DX: Other diseases of salivary glands: K11.8

## 2017-11-15 ENCOUNTER — Other Ambulatory Visit (HOSPITAL_COMMUNITY): Payer: Self-pay | Admitting: Otolaryngology

## 2017-11-15 DIAGNOSIS — K118 Other diseases of salivary glands: Secondary | ICD-10-CM

## 2017-11-16 ENCOUNTER — Other Ambulatory Visit (HOSPITAL_COMMUNITY): Payer: Self-pay | Admitting: Otolaryngology

## 2017-11-16 DIAGNOSIS — K118 Other diseases of salivary glands: Secondary | ICD-10-CM

## 2017-11-22 ENCOUNTER — Encounter (HOSPITAL_COMMUNITY): Payer: Self-pay

## 2017-11-22 ENCOUNTER — Ambulatory Visit (HOSPITAL_COMMUNITY): Payer: 59

## 2017-11-27 ENCOUNTER — Ambulatory Visit (HOSPITAL_COMMUNITY): Payer: 59

## 2017-12-14 ENCOUNTER — Ambulatory Visit
Admission: RE | Admit: 2017-12-14 | Discharge: 2017-12-14 | Disposition: A | Discharge status: Home / Self Care | Payer: Self-pay | Source: Ambulatory Visit

## 2017-12-14 ENCOUNTER — Other Ambulatory Visit (HOSPITAL_COMMUNITY): Payer: Self-pay

## 2017-12-14 DIAGNOSIS — R52 Pain, unspecified: Secondary | ICD-10-CM

## 2017-12-27 ENCOUNTER — Ambulatory Visit (HOSPITAL_COMMUNITY)
Admission: RE | Admit: 2017-12-27 | Discharge: 2017-12-27 | Disposition: A | Discharge status: Home / Self Care | Payer: Non-veteran care | Source: Ambulatory Visit | Attending: Otolaryngology | Admitting: Otolaryngology

## 2018-01-07 ENCOUNTER — Other Ambulatory Visit: Payer: Self-pay | Admitting: Radiology

## 2018-01-09 ENCOUNTER — Other Ambulatory Visit: Payer: Self-pay

## 2018-01-09 ENCOUNTER — Encounter (HOSPITAL_COMMUNITY): Payer: Self-pay

## 2018-01-09 ENCOUNTER — Ambulatory Visit (HOSPITAL_COMMUNITY)
Admission: RE | Admit: 2018-01-09 | Discharge: 2018-01-09 | Disposition: A | Discharge status: Home / Self Care | Payer: No Typology Code available for payment source | Source: Ambulatory Visit | Attending: Otolaryngology | Admitting: Otolaryngology

## 2018-01-09 DIAGNOSIS — G4730 Sleep apnea, unspecified: Secondary | ICD-10-CM | POA: Diagnosis not present

## 2018-01-09 DIAGNOSIS — E1151 Type 2 diabetes mellitus with diabetic peripheral angiopathy without gangrene: Secondary | ICD-10-CM | POA: Insufficient documentation

## 2018-01-09 DIAGNOSIS — Z794 Long term (current) use of insulin: Secondary | ICD-10-CM | POA: Insufficient documentation

## 2018-01-09 DIAGNOSIS — Z79891 Long term (current) use of opiate analgesic: Secondary | ICD-10-CM | POA: Diagnosis not present

## 2018-01-09 DIAGNOSIS — Z86718 Personal history of other venous thrombosis and embolism: Secondary | ICD-10-CM | POA: Insufficient documentation

## 2018-01-09 DIAGNOSIS — Z833 Family history of diabetes mellitus: Secondary | ICD-10-CM | POA: Diagnosis not present

## 2018-01-09 DIAGNOSIS — Z87891 Personal history of nicotine dependence: Secondary | ICD-10-CM | POA: Insufficient documentation

## 2018-01-09 DIAGNOSIS — I10 Essential (primary) hypertension: Secondary | ICD-10-CM | POA: Insufficient documentation

## 2018-01-09 DIAGNOSIS — F1290 Cannabis use, unspecified, uncomplicated: Secondary | ICD-10-CM | POA: Diagnosis not present

## 2018-01-09 DIAGNOSIS — R51 Headache: Secondary | ICD-10-CM | POA: Diagnosis not present

## 2018-01-09 DIAGNOSIS — K118 Other diseases of salivary glands: Secondary | ICD-10-CM | POA: Insufficient documentation

## 2018-01-09 DIAGNOSIS — Z888 Allergy status to other drugs, medicaments and biological substances status: Secondary | ICD-10-CM | POA: Insufficient documentation

## 2018-01-09 DIAGNOSIS — Z79899 Other long term (current) drug therapy: Secondary | ICD-10-CM | POA: Diagnosis not present

## 2018-01-09 DIAGNOSIS — Z7982 Long term (current) use of aspirin: Secondary | ICD-10-CM | POA: Insufficient documentation

## 2018-01-09 LAB — CBC
HCT: 47.5 % (ref 39.0–52.0)
Hemoglobin: 15 g/dL (ref 13.0–17.0)
MCH: 28 pg (ref 26.0–34.0)
MCHC: 31.6 g/dL (ref 30.0–36.0)
MCV: 88.6 fL (ref 80.0–100.0)
PLATELETS: 304 10*3/uL (ref 150–400)
RBC: 5.36 MIL/uL (ref 4.22–5.81)
RDW: 13.1 % (ref 11.5–15.5)
WBC: 9.1 10*3/uL (ref 4.0–10.5)
nRBC: 0 % (ref 0.0–0.2)

## 2018-01-09 LAB — PROTIME-INR
INR: 0.98
PROTHROMBIN TIME: 12.9 s (ref 11.4–15.2)

## 2018-01-09 LAB — GLUCOSE, CAPILLARY: GLUCOSE-CAPILLARY: 140 mg/dL — AB (ref 70–99)

## 2018-01-09 MED ORDER — MIDAZOLAM HCL 2 MG/2ML IJ SOLN
INTRAMUSCULAR | Status: AC | PRN
  Administered 2018-01-09: 1 mg via INTRAVENOUS
  Administered 2018-01-09 (×2): 0.5 mg via INTRAVENOUS

## 2018-01-09 MED ORDER — MIDAZOLAM HCL 2 MG/2ML IJ SOLN
INTRAMUSCULAR | Status: AC
  Filled 2018-01-09: qty 2

## 2018-01-09 MED ORDER — SODIUM CHLORIDE 0.9 % IV SOLN: INTRAVENOUS | Status: DC

## 2018-01-09 MED ORDER — FENTANYL CITRATE (PF) 100 MCG/2ML IJ SOLN
INTRAMUSCULAR | Status: AC | PRN
  Administered 2018-01-09: 50 ug via INTRAVENOUS
  Administered 2018-01-09 (×2): 25 ug via INTRAVENOUS

## 2018-01-09 MED ORDER — LIDOCAINE HCL (PF) 1 % IJ SOLN
INTRAMUSCULAR | Status: AC
  Filled 2018-01-09: qty 30

## 2018-01-09 MED ORDER — FENTANYL CITRATE (PF) 100 MCG/2ML IJ SOLN
INTRAMUSCULAR | Status: AC
  Filled 2018-01-09: qty 2

## 2018-01-09 NOTE — Discharge Instructions (Signed)
Needle Biopsy, Care After °These instructions give you information about caring for yourself after your procedure. Your doctor may also give you more specific instructions. Call your doctor if you have any problems or questions after your procedure. °Follow these instructions at home: °· Rest as told by your doctor. °· Take medicines only as told by your doctor. °· There are many different ways to close and cover the biopsy site, including stitches (sutures), skin glue, and adhesive strips. Follow instructions from your doctor about: °? How to take care of your biopsy site. °? When and how you should change your bandage (dressing). °? When you should remove your dressing. °? Removing whatever was used to close your biopsy site. °· Check your biopsy site every day for signs of infection. Watch for: °? Redness, swelling, or pain. °? Fluid, blood, or pus. °Contact a doctor if: °· You have a fever. °· You have redness, swelling, or pain at the biopsy site, and it lasts longer than a few days. °· You have fluid, blood, or pus coming from the biopsy site. °· You feel sick to your stomach (nauseous). °· You throw up (vomit). °Get help right away if: °· You are short of breath. °· You have trouble breathing. °· Your chest hurts. °· You feel dizzy or you pass out (faint). °· You have bleeding that does not stop with pressure or a bandage. °· You cough up blood. °· Your belly (abdomen) hurts. °This information is not intended to replace advice given to you by your health care provider. Make sure you discuss any questions you have with your health care provider. °Document Released: 01/27/2008 Document Revised: 07/22/2015 Document Reviewed: 02/09/2014 °Elsevier Interactive Patient Education © 2018 Elsevier Inc. ° °

## 2018-01-09 NOTE — H&P (Signed)
Chief Complaint: Patient was seen in consultation today for left parotid biopsy at the request of [place]  Referring Physician(s): Byers,John  Supervising Physician: Arne Cleveland  Patient Status: [facility] - Out-pt  History of Present Illness: [name]. is a 67 y.o. male   Pt was seen for Cervical MRI secondary headaches 11/16/17 MRI also revealed incidental left parotid enlargement  Was referred to [facility] for biopsy of same Quit smoking 1979; no smokeless tobacco Does smoke marijuana weekly  Past Medical History:  Diagnosis Date  . Cataract   . Clotting disorder ([place])    DVT in 2006 x1; no problems since  . Diabetes mellitus   . Diverticulitis   . Hypertension   . Peripheral vascular disease ([place]) 2006   dvt 's  . Pneumonia 06  . Sleep apnea    cpap started 2 months ago set on 4  . Subdural hematoma [facility])     Past Surgical History:  Procedure Laterality Date  . BRAIN SURGERY  2010   subdural hematoma -bleed  . DG THUMB LEFT HAND Left    "gamers "  . EYE SURGERY Right 12/16   cataract  . ROTATOR CUFF REPAIR W/ DISTAL CLAVICLE EXCISION Left 05/20/2015  . SHOULDER ARTHROSCOPY WITH SUBACROMIAL DECOMPRESSION, ROTATOR CUFF REPAIR AND BICEP TENDON REPAIR Left 05/20/2015   Procedure: LEFT SHOULDER ARTHROSCOPY WITH SUBACROMIAL DECOMPRESSION,DISTAL CLAVICAL RESECTION  ROTATOR CUFF REPAIR ;  Surgeon: Justice Britain, MD;  Location: [place];  Service: Orthopedics;  Laterality: Left;  . TONSILLECTOMY      Allergies: Peanuts [peanut oil]; Tamiflu [oseltamivir phosphate]; Gabapentin; Lyrica [pregabalin]; Statins; and Avelox [moxifloxacin hcl in nacl]  Medications: Prior to Admission medications   Medication Sig Start Date End Date Taking? Authorizing Provider  carvedilol (COREG) 6.25 MG tablet Take 3.125 mg by mouth 2 (two) times daily. 11/07/17  Yes [provider]  amLODipine (NORVASC) 10 MG tablet Take 10 mg by mouth daily.    [provider]   aspirin 325 MG EC tablet Take 325 mg by mouth daily.    [provider]  diazepam (VALIUM) 5 MG tablet Take 0.5-1 tablets (2.5-5 mg total) by mouth every 6 (six) hours as needed for muscle spasms or sedation. 05/21/15   Shuford, Olivia Mackie, PA-C  HYDROmorphone (DILAUDID) 2 MG tablet Take 1-2 tablets (2-4 mg total) by mouth every 4 (four) hours as needed for severe pain. 05/21/15   Shuford, Olivia Mackie, PA-C  insulin aspart (NOVOLOG) 100 UNIT/ML injection Inject 12 Units into the skin 3 (three) times daily with meals.     [provider]  insulin glargine (LANTUS) 100 UNIT/ML injection Inject 30 Units into the skin 2 (two) times daily.     [provider]  magnesium oxide (MAG-OX) 400 MG tablet Take 400 mg by mouth at bedtime.    [provider]  Multiple Vitamin (MULTIVITAMIN WITH MINERALS) TABS Take 1 tablet by mouth daily.    [provider]  naproxen (NAPROSYN) 500 MG tablet Take 1 tablet (500 mg total) by mouth 2 (two) times daily with a meal. 05/21/15   Shuford, Olivia Mackie, PA-C  Omega-3 Fatty Acids (FISH OIL PO) Take 1 capsule by mouth daily.    [provider]  ondansetron (ZOFRAN) 4 MG tablet Take 1 tablet (4 mg total) by mouth every 8 (eight) hours as needed for nausea or vomiting. 05/21/15   Shuford, Olivia Mackie, PA-C  oxyCODONE-acetaminophen (PERCOCET) 10-325 MG tablet Take 1 tablet by mouth every 4 (four) hours  as needed for pain. 05/21/15   Shuford, Olivia Mackie, PA-C  potassium gluconate 595 MG TABS Take 595 mg by mouth daily.    [provider]  terazosin (HYTRIN) 5 MG capsule Take 5 mg by mouth at bedtime.    [provider]  Vitamin D, Ergocalciferol, (DRISDOL) 50000 units CAPS capsule Take 50,000 Units by mouth 3 (three) times a week. MON WED FRI    [provider]     Family History  Problem Relation Age of Onset  . Stroke Father   . Diabetes Brother     Social History   Socioeconomic History  . Marital status: Married     Spouse name: Not on file  . Number of children: Not on file  . Years of education: Not on file  . Highest education level: Not on file  Occupational History  . Not on file  Social Needs  . Financial resource strain: Not on file  . Food insecurity:    Worry: Not on file    Inability: Not on file  . Transportation needs:    Medical: Not on file    Non-medical: Not on file  Tobacco Use  . Smoking status: Former Smoker    Packs/day: 1.00    Years: 15.00    Pack years: 15.00    Types: Cigarettes    Last attempt to quit: 05/06/1981    Years since quitting: 36.7  . Smokeless tobacco: Never Used  Substance and Sexual Activity  . Alcohol use: Yes    Alcohol/week: 10.0 standard drinks    Types: 10 Cans of beer per week    Comment: mostly weekend  . Drug use: No  . Sexual activity: Never  Lifestyle  . Physical activity:    Days per week: Not on file    Minutes per session: Not on file  . Stress: Not on file  Relationships  . Social connections:    Talks on phone: Not on file    Gets together: Not on file    Attends religious service: Not on file    Active member of club or organization: Not on file    Attends meetings of clubs or organizations: Not on file    Relationship status: Not on file  Other Topics Concern  . Not on file  Social History Narrative  . Not on file    Review of Systems: A 12 point ROS discussed and pertinent positives are indicated in the HPI above.  All other systems are negative.  Review of Systems  Constitutional: Negative for activity change, fatigue and fever.  Respiratory: Negative for cough and shortness of breath.   Cardiovascular: Negative for chest pain.  Gastrointestinal: Negative for abdominal pain.  Neurological: Negative for weakness.  Psychiatric/Behavioral: Negative for behavioral problems and confusion.    Vital Signs: BP (!) 168/68   Pulse 67   Temp 98.7 F (37.1 C)   Ht 6\' 2"  (1.88 m)   Wt 280 lb (127 kg)   SpO2 97%   BMI  35.95 kg/m   Physical Exam  Constitutional: He is oriented to person, place, and time.  Cardiovascular: Normal rate, regular rhythm and normal heart sounds.  Pulmonary/Chest: Effort normal and breath sounds normal.  Abdominal: Soft. Bowel sounds are normal.  Musculoskeletal: Normal range of motion.  Neurological: He is alert and oriented to person, place, and time.  Skin: Skin is warm and dry.  Psychiatric: He has a normal mood and affect. His behavior is normal. Judgment  and thought content normal.  Vitals reviewed.   Imaging: No results found.  Labs:  CBC: No results for input(s): WBC, HGB, HCT, PLT in the last 8760 hours.  COAGS: No results for input(s): INR, APTT in the last 8760 hours.  BMP: No results for input(s): NA, K, CL, CO2, GLUCOSE, BUN, CALCIUM, CREATININE, GFRNONAA, GFRAA in the last 8760 hours.  Invalid input(s): CMP  LIVER FUNCTION TESTS: No results for input(s): BILITOT, AST, ALT, ALKPHOS, PROT, ALBUMIN in the last 8760 hours.  TUMOR MARKERS: No results for input(s): AFPTM, CEA, CA199, CHROMGRNA in the last 8760 hours.  Assessment and Plan:  Left parotid enlargement Asymptomatic-- found incidentally on Cervical MRI Scheduled now for biopsy of same Risks and benefits discussed with the patient including, but not limited to bleeding, infection, damage to adjacent structures or low yield requiring additional tests.  All of the patient's questions were answered, patient is agreeable to proceed. Consent signed and in chart.    Thank you for this interesting consult.  I greatly enjoyed meeting [name]. and look forward to participating in their care.  A copy of this report was sent to the requesting provider on this date.  Electronically Signed: Lavonia Drafts, PA-C 01/09/2018, 11:57 AM   I spent a total of  30 Minutes   in face to face in clinical consultation, greater than 50% of which was counseling/coordinating care for left parotid  enlargement biopsy

## 2018-01-09 NOTE — Procedures (Addendum)
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  Venetia Night. Kathlene Cote, M.D Pager:  [phone]

## 2019-07-21 ENCOUNTER — Emergency Department (HOSPITAL_COMMUNITY): Payer: No Typology Code available for payment source

## 2019-07-21 ENCOUNTER — Encounter (HOSPITAL_COMMUNITY): Payer: Self-pay | Admitting: *Deleted

## 2019-07-21 ENCOUNTER — Other Ambulatory Visit: Payer: Self-pay

## 2019-07-21 ENCOUNTER — Emergency Department (HOSPITAL_COMMUNITY)
Admission: EM | Admit: 2019-07-21 | Discharge: 2019-07-22 | Discharge status: Against Medical Advice | Payer: No Typology Code available for payment source | Attending: Emergency Medicine | Admitting: Emergency Medicine

## 2019-07-21 DIAGNOSIS — Z5321 Procedure and treatment not carried out due to patient leaving prior to being seen by health care provider: Secondary | ICD-10-CM | POA: Diagnosis not present

## 2019-07-21 DIAGNOSIS — I10 Essential (primary) hypertension: Secondary | ICD-10-CM | POA: Insufficient documentation

## 2019-07-21 LAB — CBC
HCT: 46.6 % (ref 39.0–52.0)
Hemoglobin: 15.5 g/dL (ref 13.0–17.0)
MCH: 29.5 pg (ref 26.0–34.0)
MCHC: 33.3 g/dL (ref 30.0–36.0)
MCV: 88.8 fL (ref 80.0–100.0)
Platelets: 262 10*3/uL (ref 150–400)
RBC: 5.25 MIL/uL (ref 4.22–5.81)
RDW: 13.2 % (ref 11.5–15.5)
WBC: 9.2 10*3/uL (ref 4.0–10.5)
nRBC: 0 % (ref 0.0–0.2)

## 2019-07-21 LAB — BASIC METABOLIC PANEL
Anion gap: 11 (ref 5–15)
BUN: 8 mg/dL (ref 8–23)
CO2: 26 mmol/L (ref 22–32)
Calcium: 9 mg/dL (ref 8.9–10.3)
Chloride: 93 mmol/L — ABNORMAL LOW (ref 98–111)
Creatinine, Ser: 0.71 mg/dL (ref 0.61–1.24)
GFR calc Af Amer: >60 mL/min (ref >60)
GFR calc non Af Amer: >60 mL/min (ref >60)
Glucose, Bld: 136 mg/dL — ABNORMAL HIGH (ref 70–99)
Potassium: 4 mmol/L (ref 3.5–5.1)
Sodium: 130 mmol/L — ABNORMAL LOW (ref 135–145)

## 2019-07-21 LAB — TROPONIN I (HIGH SENSITIVITY): Troponin I (High Sensitivity): 7 ng/L (ref <18)

## 2019-07-21 MED ORDER — SODIUM CHLORIDE 0.9% FLUSH: 3.0000 mL | Freq: Once | INTRAVENOUS | Status: DC

## 2019-07-21 NOTE — ED Triage Notes (Signed)
Pt is here due to hypertension. Pt states that he had a cold and took mucinex DM without realizing that this should not be taken with his BP meds.  Pt BP at home was 212/77.  No CP with this.  Pt has a slight headache and feels generally unwell.

## 2019-07-22 LAB — TROPONIN I (HIGH SENSITIVITY): Troponin I (High Sensitivity): 8 ng/L (ref <18)

## 2019-07-22 NOTE — ED Notes (Signed)
Pt encouraged to stay  Pt said he couldn't wait any longer

## 2019-07-30 ENCOUNTER — Observation Stay (HOSPITAL_COMMUNITY)
Admission: EM | Admit: 2019-07-30 | Discharge: 2019-08-01 | Disposition: A | Discharge status: Home / Self Care | Payer: No Typology Code available for payment source | Attending: Internal Medicine | Admitting: Internal Medicine

## 2019-07-30 ENCOUNTER — Emergency Department (HOSPITAL_COMMUNITY): Payer: No Typology Code available for payment source

## 2019-07-30 ENCOUNTER — Other Ambulatory Visit: Payer: Self-pay

## 2019-07-30 ENCOUNTER — Encounter (HOSPITAL_COMMUNITY): Payer: Self-pay

## 2019-07-30 DIAGNOSIS — E1151 Type 2 diabetes mellitus with diabetic peripheral angiopathy without gangrene: Secondary | ICD-10-CM | POA: Diagnosis not present

## 2019-07-30 DIAGNOSIS — Z86718 Personal history of other venous thrombosis and embolism: Secondary | ICD-10-CM | POA: Insufficient documentation

## 2019-07-30 DIAGNOSIS — Z888 Allergy status to other drugs, medicaments and biological substances status: Secondary | ICD-10-CM | POA: Insufficient documentation

## 2019-07-30 DIAGNOSIS — I10 Essential (primary) hypertension: Secondary | ICD-10-CM

## 2019-07-30 DIAGNOSIS — G4733 Obstructive sleep apnea (adult) (pediatric): Secondary | ICD-10-CM | POA: Insufficient documentation

## 2019-07-30 DIAGNOSIS — Z833 Family history of diabetes mellitus: Secondary | ICD-10-CM | POA: Insufficient documentation

## 2019-07-30 DIAGNOSIS — Z79899 Other long term (current) drug therapy: Secondary | ICD-10-CM | POA: Diagnosis not present

## 2019-07-30 DIAGNOSIS — Z7982 Long term (current) use of aspirin: Secondary | ICD-10-CM | POA: Insufficient documentation

## 2019-07-30 DIAGNOSIS — Z794 Long term (current) use of insulin: Secondary | ICD-10-CM | POA: Insufficient documentation

## 2019-07-30 DIAGNOSIS — Z91010 Allergy to peanuts: Secondary | ICD-10-CM | POA: Insufficient documentation

## 2019-07-30 DIAGNOSIS — R0789 Other chest pain: Secondary | ICD-10-CM | POA: Insufficient documentation

## 2019-07-30 DIAGNOSIS — Z87820 Personal history of traumatic brain injury: Secondary | ICD-10-CM | POA: Insufficient documentation

## 2019-07-30 DIAGNOSIS — G4730 Sleep apnea, unspecified: Secondary | ICD-10-CM | POA: Diagnosis present

## 2019-07-30 DIAGNOSIS — R9431 Abnormal electrocardiogram [ECG] [EKG]: Secondary | ICD-10-CM

## 2019-07-30 DIAGNOSIS — Z6835 Body mass index (BMI) 35.0-35.9, adult: Secondary | ICD-10-CM | POA: Insufficient documentation

## 2019-07-30 DIAGNOSIS — E1165 Type 2 diabetes mellitus with hyperglycemia: Secondary | ICD-10-CM | POA: Diagnosis not present

## 2019-07-30 DIAGNOSIS — I160 Hypertensive urgency: Secondary | ICD-10-CM | POA: Diagnosis not present

## 2019-07-30 DIAGNOSIS — Z20822 Contact with and (suspected) exposure to covid-19: Secondary | ICD-10-CM | POA: Diagnosis not present

## 2019-07-30 DIAGNOSIS — R079 Chest pain, unspecified: Secondary | ICD-10-CM | POA: Diagnosis present

## 2019-07-30 DIAGNOSIS — E669 Obesity, unspecified: Secondary | ICD-10-CM | POA: Insufficient documentation

## 2019-07-30 DIAGNOSIS — Z87891 Personal history of nicotine dependence: Secondary | ICD-10-CM | POA: Diagnosis not present

## 2019-07-30 LAB — CBC WITH DIFFERENTIAL/PLATELET
Abs Immature Granulocytes: 0.06 10*3/uL (ref 0.00–0.07)
Basophils Absolute: 0.1 10*3/uL (ref 0.0–0.1)
Basophils Relative: 1 %
Eosinophils Absolute: 0.1 10*3/uL (ref 0.0–0.5)
Eosinophils Relative: 1 %
HCT: 46.9 % (ref 39.0–52.0)
Hemoglobin: 16.1 g/dL (ref 13.0–17.0)
Immature Granulocytes: 1 %
Lymphocytes Relative: 16 %
Lymphs Abs: 1.6 10*3/uL (ref 0.7–4.0)
MCH: 30 pg (ref 26.0–34.0)
MCHC: 34.3 g/dL (ref 30.0–36.0)
MCV: 87.5 fL (ref 80.0–100.0)
Monocytes Absolute: 0.8 10*3/uL (ref 0.1–1.0)
Monocytes Relative: 8 %
Neutro Abs: 7.6 10*3/uL (ref 1.7–7.7)
Neutrophils Relative %: 73 %
Platelets: 309 10*3/uL (ref 150–400)
RBC: 5.36 MIL/uL (ref 4.22–5.81)
RDW: 13.2 % (ref 11.5–15.5)
WBC: 10.3 10*3/uL (ref 4.0–10.5)
nRBC: 0 % (ref 0.0–0.2)

## 2019-07-30 LAB — TROPONIN I (HIGH SENSITIVITY): Troponin I (High Sensitivity): 8 ng/L (ref <18)

## 2019-07-30 LAB — COMPREHENSIVE METABOLIC PANEL
ALT: 25 U/L (ref 0–44)
AST: 19 U/L (ref 15–41)
Albumin: 4.1 g/dL (ref 3.5–5.0)
Alkaline Phosphatase: 73 U/L (ref 38–126)
Anion gap: 11 (ref 5–15)
BUN: 14 mg/dL (ref 8–23)
CO2: 29 mmol/L (ref 22–32)
Calcium: 9 mg/dL (ref 8.9–10.3)
Chloride: 96 mmol/L — ABNORMAL LOW (ref 98–111)
Creatinine, Ser: 0.69 mg/dL (ref 0.61–1.24)
GFR calc Af Amer: >60 mL/min (ref >60)
GFR calc non Af Amer: >60 mL/min (ref >60)
Glucose, Bld: 167 mg/dL — ABNORMAL HIGH (ref 70–99)
Potassium: 3.7 mmol/L (ref 3.5–5.1)
Sodium: 136 mmol/L (ref 135–145)
Total Bilirubin: 0.8 mg/dL (ref 0.3–1.2)
Total Protein: 7.7 g/dL (ref 6.5–8.1)

## 2019-07-30 MED ORDER — LABETALOL HCL 5 MG/ML IV SOLN
10.0000 mg | Freq: Once | INTRAVENOUS | Status: AC
  Administered 2019-07-31: 10 mg via INTRAVENOUS
  Filled 2019-07-30: qty 4

## 2019-07-30 NOTE — ED Provider Notes (Signed)
[place] DEPT Provider Note   CSN: [id number] Arrival date & time: 07/30/19  1911     History Chief Complaint  Patient presents with  . Hypertension    [name]. is a 69 y.o. male.  Patient with a history of HTN, remote DVT (2006), OSA, PVD, SDH, DM presents to ED with concern for elevated blood pressure today. He reports chest tightness, mild SOB, dizziness that started earlier today. He took an extra dose of his hydralazine this afternoon. Dizziness started this evening prompting ED visit. No nausea or vomiting, no diaphoresis. He went to [facility] on 07/21/19 for same concern, but blood pressure improved during a long wait and the patient left prior to being seen. At that time, the elevated pressure was felt secondary to his taking Mucinex for cough and cold symptoms. He takes blood pressure measurements at home and reports it has been normal in the interim until today.  The history is provided by the patient. No language interpreter was used.  Hypertension Associated symptoms include shortness of breath. Pertinent negatives include no abdominal pain.       Past Medical History:  Diagnosis Date  . Cataract   . Clotting disorder ([place])    DVT in 2006 x1; no problems since  . Diabetes mellitus   . Diverticulitis   . Hypertension   . Peripheral vascular disease ([place]) 2006   dvt 's  . Pneumonia 06  . Sleep apnea    cpap started 2 months ago set on 4  . Subdural hematoma [facility])     Patient Active Problem List   Diagnosis Date Noted  . S/P rotator cuff repair 05/20/2015  . Shortness of breath   . Essential hypertension, benign   . Dyspepsia   . Type 2 diabetes mellitus with hyperglycemia, with long-term current use of insulin [facility])     Past Surgical History:  Procedure Laterality Date  . BRAIN SURGERY  2010   subdural hematoma -bleed  . DG THUMB LEFT HAND Left    "gamers "  . EYE SURGERY Right 12/16   cataract  .  ROTATOR CUFF REPAIR W/ DISTAL CLAVICLE EXCISION Left 05/20/2015  . SHOULDER ARTHROSCOPY WITH SUBACROMIAL DECOMPRESSION, ROTATOR CUFF REPAIR AND BICEP TENDON REPAIR Left 05/20/2015   Procedure: LEFT SHOULDER ARTHROSCOPY WITH SUBACROMIAL DECOMPRESSION,DISTAL CLAVICAL RESECTION  ROTATOR CUFF REPAIR ;  Surgeon: Justice Britain, MD;  Location: [place];  Service: Orthopedics;  Laterality: Left;  . TONSILLECTOMY         Family History  Problem Relation Age of Onset  . Stroke Father   . Diabetes Brother     Social History   Tobacco Use  . Smoking status: Former Smoker    Packs/day: 1.00    Years: 15.00    Pack years: 15.00    Types: Cigarettes    Quit date: 05/06/1981    Years since quitting: 38.2  . Smokeless tobacco: Never Used  Substance Use Topics  . Alcohol use: Yes    Alcohol/week: 10.0 standard drinks    Types: 10 Cans of beer per week    Comment: mostly weekend  . Drug use: No    Home Medications Prior to Admission medications   Medication Sig Start Date End Date Taking? Authorizing Provider  carvedilol (COREG) 6.25 MG tablet Take 3.125 mg by mouth 2 (two) times daily. 11/07/17   [provider]  Cholecalciferol (DIALYVITE VITAMIN D 5000 PO) Take 5,000 Units by mouth every Monday, Wednesday,  and Friday. Takes at night    [provider]  diazepam (VALIUM) 5 MG tablet Take 0.5-1 tablets (2.5-5 mg total) by mouth every 6 (six) hours as needed for muscle spasms or sedation. Patient not taking: Reported on 01/09/2018 05/21/15   Shuford, Olivia Mackie, PA-C  hydrochlorothiazide (HYDRODIURIL) 25 MG tablet Take 25 mg by mouth every morning. 11/28/17   [provider]  HYDROmorphone (DILAUDID) 2 MG tablet Take 1-2 tablets (2-4 mg total) by mouth every 4 (four) hours as needed for severe pain. Patient not taking: Reported on 01/09/2018 05/21/15   Shuford, Olivia Mackie, PA-C  insulin aspart (NOVOLOG) 100 UNIT/ML injection Inject 8 Units into the skin 2 (two) times daily.      [provider]  insulin glargine (LANTUS) 100 UNIT/ML injection Inject 25 Units into the skin 2 (two) times daily.     [provider]  LACTOBACILLUS BIFIDUS PO Take 1 tablet by mouth 2 (two) times daily.    [provider]  magnesium oxide (MAG-OX) 400 MG tablet Take 400 mg by mouth at bedtime.    [provider]  Multiple Vitamin (MULTIVITAMIN WITH MINERALS) TABS Take 1 tablet by mouth every evening.     [provider]  Naphazoline-Pheniramine (OPCON-A) 0.027-0.315 % SOLN Apply 1 drop to eye daily as needed (dry eyes).    [provider]  naproxen (NAPROSYN) 500 MG tablet Take 1 tablet (500 mg total) by mouth 2 (two) times daily with a meal. Patient not taking: Reported on 01/09/2018 05/21/15   Shuford, Olivia Mackie, PA-C  Omega-3 Fatty Acids (FISH OIL PO) Take 1 capsule by mouth every evening.     [provider]  ondansetron (ZOFRAN) 4 MG tablet Take 1 tablet (4 mg total) by mouth every 8 (eight) hours as needed for nausea or vomiting. Patient not taking: Reported on 01/09/2018 05/21/15   Shuford, Olivia Mackie, PA-C  OVER THE COUNTER MEDICATION Take 1 capsule by mouth at bedtime. Oil of [place]    [provider]  oxyCODONE-acetaminophen (PERCOCET) 10-325 MG tablet Take 1 tablet by mouth every 4 (four) hours as needed for pain. Patient not taking: Reported on 01/09/2018 05/21/15   Shuford, Olivia Mackie, PA-C  potassium gluconate 595 MG TABS Take 595 mg by mouth every evening.     [provider]  terazosin (HYTRIN) 5 MG capsule Take 5 mg by mouth 2 (two) times daily.     [provider]    Allergies    Peanuts [peanut oil], Tamiflu [oseltamivir phosphate], Gabapentin, Lyrica [pregabalin], Statins, and Avelox [moxifloxacin hcl in nacl]  Review of Systems   Review of Systems  Constitutional: Negative for chills, diaphoresis and fever.  HENT: Negative.   Respiratory: Positive for chest tightness and shortness of breath.  Negative for cough.   Cardiovascular: Negative.  Negative for leg swelling.  Gastrointestinal: Negative.  Negative for abdominal pain, nausea and vomiting.  Musculoskeletal: Negative.   Skin: Negative.   Neurological: Positive for dizziness. Negative for weakness.    Physical Exam Updated Vital Signs BP (!) 227/86 (BP Location: Left Arm)   Pulse 70   Temp 98.4 F (36.9 C) (Oral)   Resp 18   Ht 6\' 2"  (1.88 m)   Wt 112 kg   SpO2 98%   BMI 31.71 kg/m   Physical Exam Vitals and nursing note reviewed.  Constitutional:      General: He is not in acute distress.    Appearance: He is well-developed. He is obese. He is not diaphoretic.  HENT:     Head: Normocephalic.  Cardiovascular:     Rate and Rhythm: Normal rate and regular rhythm.  Pulmonary:     Effort: Pulmonary effort is normal.     Breath sounds: Normal breath sounds. No wheezing, rhonchi or rales.  Abdominal:     General: Bowel sounds are normal.     Palpations: Abdomen is soft.     Tenderness: There is no abdominal tenderness. There is no guarding or rebound.  Musculoskeletal:        General: Normal range of motion.     Cervical back: Normal range of motion and neck supple.     Right lower leg: No edema.     Left lower leg: No edema.  Skin:    General: Skin is warm and dry.     Findings: No rash.  Neurological:     Mental Status: He is alert and oriented to person, place, and time.     ED Results / Procedures / Treatments   Labs (all labs ordered are listed, but only abnormal results are displayed) Labs Reviewed  CBC WITH DIFFERENTIAL/PLATELET  COMPREHENSIVE METABOLIC PANEL  TROPONIN I (HIGH SENSITIVITY)    EKG EKG Interpretation  Date/Time:  Wednesday July 30 2019 23:05:26 EDT Ventricular Rate:  66 PR Interval:    QRS Duration: 161 QT Interval:  442 QTC Calculation: 464 R Axis:   74 Text Interpretation: Sinus rhythm Atrial premature complex Right bundle branch block No significant change since  last tracing Confirmed by Isla Pence [phone]) on 07/30/2019 11:23:42 PM   Radiology DG Chest Portable 1 View  Result Date: 07/30/2019 CLINICAL DATA:  Chest pain EXAM: PORTABLE CHEST 1 VIEW COMPARISON:  07/21/2019 FINDINGS: Heart and mediastinal contours are within normal limits. No focal opacities or effusions. No acute bony abnormality. IMPRESSION: No active disease. Electronically Signed   By: Rolm Baptise M.D.   On: 07/30/2019 23:06    Procedures Procedures (including critical care time)  Medications Ordered in ED Medications - No data to display  ED Course  I have reviewed the triage vital signs and the nursing notes.  Pertinent labs & imaging results that were available during my care of the patient were reviewed by me and considered in my medical decision making (see chart for details).    MDM Rules/Calculators/A&P                      Patient to ED with concern for elevated blood pressure today. Similar elevation on 5/24. Having chest tightness, mild SOB.   Chart reviewed. EKG today and 5/24 show a right BBB, new from previous studies. Blood pressure consistently high at 227/86, 220/89, in the ED. Labs pending. He had normal troponins x 2 on 5/24. Given risk factors, heart score 5, new RBBB on EKG, feel the patient should be admitted for observation, blood pressure management. Patient updated with plan and is agreeable to admission.   Discussed with Dr. Hal Hope, [facility], who accepts the patient for admission.    Final Clinical Impression(s) / ED Diagnoses Final diagnoses:  None   1. Elevated blood pressure 2. Abnormal EKG  Rx / DC Orders ED Discharge Orders    None       Charlann Lange, Hershal Coria 07/31/19 0029    Isla Pence, MD 07/31/19 [phone]

## 2019-07-30 NOTE — ED Triage Notes (Signed)
Arrived by EMS from home with c/o hypertension for past 2 hours. Patient reports taking an extra Hydralazine, one at 2 PM and one at 4 PM without any significant decrease in BP. Also reports light-headedness  BP: 210/100 P: 72 R: 18 O2: 98% r/a CBG: 180

## 2019-07-31 ENCOUNTER — Encounter (HOSPITAL_COMMUNITY): Payer: Self-pay | Admitting: Internal Medicine

## 2019-07-31 DIAGNOSIS — R079 Chest pain, unspecified: Secondary | ICD-10-CM | POA: Diagnosis not present

## 2019-07-31 DIAGNOSIS — Z794 Long term (current) use of insulin: Secondary | ICD-10-CM | POA: Diagnosis not present

## 2019-07-31 DIAGNOSIS — I160 Hypertensive urgency: Secondary | ICD-10-CM | POA: Diagnosis not present

## 2019-07-31 DIAGNOSIS — E1165 Type 2 diabetes mellitus with hyperglycemia: Secondary | ICD-10-CM | POA: Diagnosis not present

## 2019-07-31 DIAGNOSIS — G4730 Sleep apnea, unspecified: Secondary | ICD-10-CM | POA: Diagnosis present

## 2019-07-31 HISTORY — DX: Hypertensive urgency: I16.0

## 2019-07-31 LAB — BASIC METABOLIC PANEL
Anion gap: 11 (ref 5–15)
BUN: 12 mg/dL (ref 8–23)
CO2: 27 mmol/L (ref 22–32)
Calcium: 8.8 mg/dL — ABNORMAL LOW (ref 8.9–10.3)
Chloride: 95 mmol/L — ABNORMAL LOW (ref 98–111)
Creatinine, Ser: 0.65 mg/dL (ref 0.61–1.24)
GFR calc Af Amer: >60 mL/min (ref >60)
GFR calc non Af Amer: >60 mL/min (ref >60)
Glucose, Bld: 154 mg/dL — ABNORMAL HIGH (ref 70–99)
Potassium: 3.8 mmol/L (ref 3.5–5.1)
Sodium: 133 mmol/L — ABNORMAL LOW (ref 135–145)

## 2019-07-31 LAB — CBC WITH DIFFERENTIAL/PLATELET
Abs Immature Granulocytes: 0.05 10*3/uL (ref 0.00–0.07)
Basophils Absolute: 0.1 10*3/uL (ref 0.0–0.1)
Basophils Relative: 1 %
Eosinophils Absolute: 0.1 10*3/uL (ref 0.0–0.5)
Eosinophils Relative: 1 %
HCT: 46.1 % (ref 39.0–52.0)
Hemoglobin: 15.5 g/dL (ref 13.0–17.0)
Immature Granulocytes: 1 %
Lymphocytes Relative: 14 %
Lymphs Abs: 1.5 10*3/uL (ref 0.7–4.0)
MCH: 29.2 pg (ref 26.0–34.0)
MCHC: 33.6 g/dL (ref 30.0–36.0)
MCV: 87 fL (ref 80.0–100.0)
Monocytes Absolute: 0.8 10*3/uL (ref 0.1–1.0)
Monocytes Relative: 8 %
Neutro Abs: 7.8 10*3/uL — ABNORMAL HIGH (ref 1.7–7.7)
Neutrophils Relative %: 75 %
Platelets: 321 10*3/uL (ref 150–400)
RBC: 5.3 MIL/uL (ref 4.22–5.81)
RDW: 13.2 % (ref 11.5–15.5)
WBC: 10.2 10*3/uL (ref 4.0–10.5)
nRBC: 0 % (ref 0.0–0.2)

## 2019-07-31 LAB — HEPATIC FUNCTION PANEL
ALT: 24 U/L (ref 0–44)
AST: 20 U/L (ref 15–41)
Albumin: 3.9 g/dL (ref 3.5–5.0)
Alkaline Phosphatase: 75 U/L (ref 38–126)
Bilirubin, Direct: 0.1 mg/dL (ref 0.0–0.2)
Indirect Bilirubin: 0.9 mg/dL (ref 0.3–0.9)
Total Bilirubin: 1 mg/dL (ref 0.3–1.2)
Total Protein: 7.2 g/dL (ref 6.5–8.1)

## 2019-07-31 LAB — GLUCOSE, CAPILLARY
Glucose-Capillary: 140 mg/dL — ABNORMAL HIGH (ref 70–99)
Glucose-Capillary: 144 mg/dL — ABNORMAL HIGH (ref 70–99)
Glucose-Capillary: 171 mg/dL — ABNORMAL HIGH (ref 70–99)

## 2019-07-31 LAB — HIV ANTIBODY (ROUTINE TESTING W REFLEX): HIV Screen 4th Generation wRfx: NONREACTIVE

## 2019-07-31 LAB — SARS CORONAVIRUS 2 BY RT PCR (HOSPITAL ORDER, PERFORMED IN ~~LOC~~ HOSPITAL LAB): SARS Coronavirus 2: NEGATIVE

## 2019-07-31 LAB — CBG MONITORING, ED
Glucose-Capillary: 160 mg/dL — ABNORMAL HIGH (ref 70–99)
Glucose-Capillary: 180 mg/dL — ABNORMAL HIGH (ref 70–99)

## 2019-07-31 LAB — TROPONIN I (HIGH SENSITIVITY): Troponin I (High Sensitivity): 7 ng/L (ref <18)

## 2019-07-31 LAB — D-DIMER, QUANTITATIVE: D-Dimer, Quant: 0.46 ug/mL-FEU (ref 0.00–0.50)

## 2019-07-31 MED ORDER — ACETAMINOPHEN 325 MG PO TABS: 650.0000 mg | ORAL_TABLET | ORAL | Status: DC | PRN

## 2019-07-31 MED ORDER — INSULIN GLARGINE 100 UNIT/ML ~~LOC~~ SOLN
20.0000 [IU] | Freq: Two times a day (BID) | SUBCUTANEOUS | Status: DC
  Administered 2019-07-31 – 2019-08-01 (×3): 20 [IU] via SUBCUTANEOUS
  Filled 2019-07-31 (×3): qty 0.2

## 2019-07-31 MED ORDER — LISINOPRIL 20 MG PO TABS: 20.0000 mg | ORAL_TABLET | Freq: Every day | ORAL | Status: DC

## 2019-07-31 MED ORDER — THIAMINE HCL 100 MG PO TABS
100.0000 mg | ORAL_TABLET | Freq: Every day | ORAL | Status: DC
  Administered 2019-07-31 – 2019-08-01 (×2): 100 mg via ORAL
  Filled 2019-07-31 (×2): qty 1

## 2019-07-31 MED ORDER — ADULT MULTIVITAMIN W/MINERALS CH
1.0000 | ORAL_TABLET | Freq: Every day | ORAL | Status: DC
  Administered 2019-07-31 – 2019-08-01 (×2): 1 via ORAL
  Filled 2019-07-31 (×2): qty 1

## 2019-07-31 MED ORDER — THIAMINE HCL 100 MG/ML IJ SOLN: 100.0000 mg | Freq: Every day | INTRAMUSCULAR | Status: DC

## 2019-07-31 MED ORDER — AMLODIPINE BESYLATE 10 MG PO TABS
10.0000 mg | ORAL_TABLET | Freq: Every day | ORAL | Status: DC
  Administered 2019-07-31 – 2019-08-01 (×2): 10 mg via ORAL
  Filled 2019-07-31: qty 2
  Filled 2019-07-31: qty 1

## 2019-07-31 MED ORDER — FOLIC ACID 1 MG PO TABS
1.0000 mg | ORAL_TABLET | Freq: Every day | ORAL | Status: DC
  Administered 2019-07-31 – 2019-08-01 (×2): 1 mg via ORAL
  Filled 2019-07-31 (×2): qty 1

## 2019-07-31 MED ORDER — LORAZEPAM 2 MG/ML IJ SOLN: 1.0000 mg | INTRAMUSCULAR | Status: DC | PRN

## 2019-07-31 MED ORDER — CALCIUM CARBONATE ANTACID 500 MG PO CHEW
1.0000 | CHEWABLE_TABLET | Freq: Once | ORAL | Status: AC
  Administered 2019-07-31: 200 mg via ORAL
  Filled 2019-07-31: qty 1

## 2019-07-31 MED ORDER — ONDANSETRON HCL 4 MG/2ML IJ SOLN: 4.0000 mg | Freq: Four times a day (QID) | INTRAMUSCULAR | Status: DC | PRN

## 2019-07-31 MED ORDER — ASPIRIN 81 MG PO CHEW
81.0000 mg | CHEWABLE_TABLET | Freq: Every day | ORAL | Status: DC
  Administered 2019-07-31 – 2019-08-01 (×2): 81 mg via ORAL
  Filled 2019-07-31 (×2): qty 1

## 2019-07-31 MED ORDER — CARVEDILOL 3.125 MG PO TABS: 6.2500 mg | ORAL_TABLET | Freq: Two times a day (BID) | ORAL | Status: DC

## 2019-07-31 MED ORDER — ENOXAPARIN SODIUM 40 MG/0.4ML ~~LOC~~ SOLN
40.0000 mg | SUBCUTANEOUS | Status: DC
  Administered 2019-07-31: 40 mg via SUBCUTANEOUS
  Filled 2019-07-31 (×2): qty 0.4

## 2019-07-31 MED ORDER — LORAZEPAM 1 MG PO TABS
1.0000 mg | ORAL_TABLET | ORAL | Status: DC | PRN
  Administered 2019-07-31 (×2): 1 mg via ORAL
  Filled 2019-07-31 (×2): qty 1

## 2019-07-31 MED ORDER — HYDROCHLOROTHIAZIDE 25 MG PO TABS
25.0000 mg | ORAL_TABLET | Freq: Every day | ORAL | Status: DC
  Administered 2019-07-31 – 2019-08-01 (×2): 25 mg via ORAL
  Filled 2019-07-31 (×2): qty 1

## 2019-07-31 MED ORDER — TERAZOSIN HCL 5 MG PO CAPS
5.0000 mg | ORAL_CAPSULE | Freq: Two times a day (BID) | ORAL | Status: DC
  Administered 2019-07-31 – 2019-08-01 (×3): 5 mg via ORAL
  Filled 2019-07-31 (×3): qty 1

## 2019-07-31 MED ORDER — CARVEDILOL 12.5 MG PO TABS
12.5000 mg | ORAL_TABLET | Freq: Two times a day (BID) | ORAL | Status: DC
  Administered 2019-07-31 – 2019-08-01 (×3): 12.5 mg via ORAL
  Filled 2019-07-31 (×3): qty 1

## 2019-07-31 MED ORDER — AMLODIPINE BESYLATE 5 MG PO TABS: 5.0000 mg | ORAL_TABLET | Freq: Every day | ORAL | Status: DC

## 2019-07-31 MED ORDER — MAGNESIUM OXIDE 400 (241.3 MG) MG PO TABS
400.0000 mg | ORAL_TABLET | Freq: Every day | ORAL | Status: DC
  Administered 2019-07-31: 400 mg via ORAL
  Filled 2019-07-31: qty 1

## 2019-07-31 MED ORDER — INSULIN ASPART 100 UNIT/ML ~~LOC~~ SOLN
0.0000 [IU] | SUBCUTANEOUS | Status: DC
  Filled 2019-07-31: qty 0.09

## 2019-07-31 MED ORDER — HYDRALAZINE HCL 25 MG PO TABS: 25.0000 mg | ORAL_TABLET | Freq: Three times a day (TID) | ORAL | Status: DC

## 2019-07-31 MED ORDER — INSULIN ASPART 100 UNIT/ML ~~LOC~~ SOLN
0.0000 [IU] | Freq: Three times a day (TID) | SUBCUTANEOUS | Status: DC
  Administered 2019-07-31: 1 [IU] via SUBCUTANEOUS
  Administered 2019-07-31 – 2019-08-01 (×3): 2 [IU] via SUBCUTANEOUS
  Filled 2019-07-31: qty 0.09

## 2019-07-31 MED ORDER — INSULIN GLARGINE 100 UNIT/ML ~~LOC~~ SOLN
10.0000 [IU] | Freq: Two times a day (BID) | SUBCUTANEOUS | Status: DC
  Filled 2019-07-31: qty 0.1

## 2019-07-31 NOTE — ED Notes (Signed)
Attempted to call report, RN requested call back in 5.

## 2019-07-31 NOTE — ED Notes (Signed)
Pt ambulatory to RR w/out assistance. 

## 2019-07-31 NOTE — Progress Notes (Signed)
Received patient from ED, VS and weight obtained, telemetry monitor applied, oriented to unit, call light placed in reach

## 2019-07-31 NOTE — Progress Notes (Addendum)
Patient ID: Alan Prose., male   DOB: 1950/08/03, 69 y.o.   MRN: [id number] Patient was admitted early this morning for hypertensive urgency and chest pain/pressure. I have reviewed patient's medical record including this morning's H&P, current vitals, medications and labs myself. Patient seen and examined at bedside and plan of care discussed with him. Continue current antihypertensives and monitor blood pressure. Repeat a.m. labs.

## 2019-07-31 NOTE — ED Notes (Signed)
Pt ambulated to the bathroom wo any assistance

## 2019-07-31 NOTE — H&P (Addendum)
History and Physical    [name]. [id number] DOB: 1951-02-05 DOA: 07/30/2019  [facility]  Patient coming from: Home.  Chief Complaint: Elevated blood pressure.  HPI: [name]. is a 69 y.o. male with history of hypertension diabetes mellitus type 2 previous history of DVT in 2006.  History of subdural hematoma presents to the ER because of persistently elevated blood pressure which has been ongoing for last 1 month.  Patient's primary care physician had added hydralazine last month for his uncontrolled blood pressure despite daily with blood pressure has been running high.  Over the last 24 hours patient also has some chest pressure which is present even at rest no relation to exertion no shortness of breath productive cough or fever chills.  Patient recently was having upper respiratory tract symptoms and was taking Mucinex which has improved.  ED Course: In the ER patient blood pressure was more than [id number] systolic and was given IV labetalol following which blood pressure improved.  EKG showed normal sinus rhythm with nonspecific ST changes chest x-ray unremarkable Covid test negative.  Labs are largely unremarkable.  Patient admitted for further management of hypertensive urgency with chest pressure.  Review of Systems: As per HPI, rest all negative.   Past Medical History:  Diagnosis Date  . Cataract   . Clotting disorder ([place])    DVT in 2006 x1; no problems since  . Diabetes mellitus   . Diverticulitis   . Hypertension   . Peripheral vascular disease ([place]) 2006   dvt 's  . Pneumonia 06  . Sleep apnea    cpap started 2 months ago set on 4  . Subdural hematoma [facility])     Past Surgical History:  Procedure Laterality Date  . BRAIN SURGERY  2010   subdural hematoma -bleed  . DG THUMB LEFT HAND Left    "gamers "  . EYE SURGERY Right 12/16   cataract  . ROTATOR CUFF REPAIR W/ DISTAL CLAVICLE EXCISION Left 05/20/2015  . SHOULDER ARTHROSCOPY  WITH SUBACROMIAL DECOMPRESSION, ROTATOR CUFF REPAIR AND BICEP TENDON REPAIR Left 05/20/2015   Procedure: LEFT SHOULDER ARTHROSCOPY WITH SUBACROMIAL DECOMPRESSION,DISTAL CLAVICAL RESECTION  ROTATOR CUFF REPAIR ;  Surgeon: Justice Britain, MD;  Location: [place];  Service: Orthopedics;  Laterality: Left;  . TONSILLECTOMY       reports that he quit smoking about 38 years ago. His smoking use included cigarettes. He has a 15.00 pack-year smoking history. He has never used smokeless tobacco. He reports current alcohol use of about 10.0 standard drinks of alcohol per week. He reports that he does not use drugs.  Allergies  Allergen Reactions  . Peanuts [Peanut Oil] Shortness Of Breath    Swelling throat, sob   . Tamiflu [Oseltamivir Phosphate] Shortness Of Breath  . Gabapentin Swelling  . Lyrica [Pregabalin] Swelling  . Statins Other (See Comments)    Leg problems.  . Avelox [Moxifloxacin Hcl In Nacl] Rash    Family History  Problem Relation Age of Onset  . Stroke Father   . Diabetes Brother     Prior to Admission medications   Medication Sig Start Date End Date Taking? Authorizing Provider  aspirin 81 MG chewable tablet Chew 81 mg by mouth daily.   Yes [provider]  carvedilol (COREG) 12.5 MG tablet Take 6.25 mg by mouth 2 (two) times daily with a meal.   Yes [provider]  Cholecalciferol (DIALYVITE VITAMIN D 5000 PO) Take 5,000 Units by  mouth every Monday, Wednesday, and Friday. Takes at night   Yes [provider]  ECHINACEA PO Take 1 tablet by mouth daily.   Yes [provider]  fluticasone (FLONASE) 50 MCG/ACT nasal spray Place 1 spray into both nostrils in the morning and at bedtime.   Yes [provider]  hydrochlorothiazide (HYDRODIURIL) 25 MG tablet Take 25 mg by mouth every morning. 11/28/17  Yes [provider]  insulin aspart (NOVOLOG) 100 UNIT/ML injection Inject 8 Units into the skin 3 (three) times daily with meals.    Yes  [provider]  insulin glargine (LANTUS) 100 UNIT/ML injection Inject 20 Units into the skin 2 (two) times daily.    Yes [provider]  LACTOBACILLUS BIFIDUS PO Take 1 tablet by mouth daily.    Yes [provider]  magnesium oxide (MAG-OX) 400 MG tablet Take 400 mg by mouth at bedtime.   Yes [provider]  Multiple Vitamin (MULTIVITAMIN WITH MINERALS) TABS Take 1 tablet by mouth every evening.    Yes [provider]  Naphazoline-Pheniramine (OPCON-A) 0.027-0.315 % SOLN Apply 1 drop to eye daily as needed (dry eyes).   Yes [provider]  Omega-3 Fatty Acids (FISH OIL PO) Take 1 capsule by mouth every evening.    Yes [provider]  OVER THE COUNTER MEDICATION Take 1 capsule by mouth at bedtime. Oil of [place]   Yes [provider]  potassium gluconate 595 MG TABS Take 595 mg by mouth every evening.    Yes [provider]  terazosin (HYTRIN) 5 MG capsule Take 5 mg by mouth 2 (two) times daily.    Yes [provider]  TURMERIC PO Take 1 tablet by mouth every evening.    Yes [provider]  diazepam (VALIUM) 5 MG tablet Take 0.5-1 tablets (2.5-5 mg total) by mouth every 6 (six) hours as needed for muscle spasms or sedation. Patient not taking: Reported on 01/09/2018 05/21/15   Shuford, Olivia Mackie, PA-C  HYDROmorphone (DILAUDID) 2 MG tablet Take 1-2 tablets (2-4 mg total) by mouth every 4 (four) hours as needed for severe pain. Patient not taking: Reported on 01/09/2018 05/21/15   Shuford, Olivia Mackie, PA-C  naproxen (NAPROSYN) 500 MG tablet Take 1 tablet (500 mg total) by mouth 2 (two) times daily with a meal. Patient not taking: Reported on 01/09/2018 05/21/15   Shuford, Olivia Mackie, PA-C  ondansetron (ZOFRAN) 4 MG tablet Take 1 tablet (4 mg total) by mouth every 8 (eight) hours as needed for nausea or vomiting. Patient not taking: Reported on 01/09/2018 05/21/15   Shuford, Olivia Mackie, PA-C  oxyCODONE-acetaminophen  (PERCOCET) 10-325 MG tablet Take 1 tablet by mouth every 4 (four) hours as needed for pain. Patient not taking: Reported on 01/09/2018 05/21/15   Shuford, Olivia Mackie, PA-C    Physical Exam: Constitutional: Moderately built and nourished. Vitals:   07/31/19 0400 07/31/19 0415 07/31/19 0430 07/31/19 0445  BP: (!) 198/85 (!) 196/83 (!) 193/81 (!) 183/87  Pulse: 75 68 69 72  Resp: 17 19 20 14   Temp:      TempSrc:      SpO2: 98% 95% 94% 95%  Weight:      Height:       Eyes: Anicteric no pallor. ENMT: No discharge from the ears eyes nose or mouth. Neck: No mass felt.  No neck rigidity. Respiratory: No rhonchi or crepitations. Cardiovascular: S1-S2 heard. Abdomen: Soft nontender bowel sounds present. Musculoskeletal: No edema. Skin: No rash. Neurologic: Alert awake oriented  to time place and person.  Moves all extremities. Psychiatric: Appears normal but normal affect.   Labs on Admission: I have personally reviewed following labs and imaging studies  CBC: Recent Labs  Lab 07/30/19 2306  WBC 10.3  NEUTROABS 7.6  HGB 16.1  HCT 46.9  MCV 87.5  PLT [id number]   Basic Metabolic Panel: Recent Labs  Lab 07/30/19 2306  NA 136  K 3.7  CL 96*  CO2 29  GLUCOSE 167*  BUN 14  CREATININE 0.69  CALCIUM 9.0   GFR: Estimated Creatinine Clearance: 117.6 mL/min (by C-G formula based on SCr of 0.69 mg/dL). Liver Function Tests: Recent Labs  Lab 07/30/19 2306  AST 19  ALT 25  ALKPHOS 73  BILITOT 0.8  PROT 7.7  ALBUMIN 4.1   No results for input(s): LIPASE, AMYLASE in the last 168 hours. No results for input(s): AMMONIA in the last 168 hours. Coagulation Profile: No results for input(s): INR, PROTIME in the last 168 hours. Cardiac Enzymes: No results for input(s): CKTOTAL, CKMB, CKMBINDEX, TROPONINI in the last 168 hours. BNP (last 3 results) No results for input(s): PROBNP in the last 8760 hours. HbA1C: No results for input(s): HGBA1C in the last 72 hours. CBG: No results for  input(s): GLUCAP in the last 168 hours. Lipid Profile: No results for input(s): CHOL, HDL, LDLCALC, TRIG, CHOLHDL, LDLDIRECT in the last 72 hours. Thyroid Function Tests: No results for input(s): TSH, T4TOTAL, FREET4, T3FREE, THYROIDAB in the last 72 hours. Anemia Panel: No results for input(s): VITAMINB12, FOLATE, FERRITIN, TIBC, IRON, RETICCTPCT in the last 72 hours. Urine analysis:    Component Value Date/Time   COLORURINE YELLOW 12/21/2012 1341   APPEARANCEUR CLEAR 12/21/2012 1341   LABSPEC 1.017 12/21/2012 1341   PHURINE 6.5 12/21/2012 1341   GLUCOSEU NEGATIVE 12/21/2012 1341   HGBUR NEGATIVE 12/21/2012 1341   BILIRUBINUR NEGATIVE 12/21/2012 1341   KETONESUR NEGATIVE 12/21/2012 1341   PROTEINUR NEGATIVE 12/21/2012 1341   UROBILINOGEN 1.0 12/21/2012 1341   NITRITE NEGATIVE 12/21/2012 1341   LEUKOCYTESUR NEGATIVE 12/21/2012 1341   Sepsis Labs: @LABRCNTIP (procalcitonin:4,lacticidven:4) ) Recent Results (from the past 240 hour(s))  SARS Coronavirus 2 by RT PCR (hospital order, performed in [facility] hospital lab) Nasopharyngeal Nasopharyngeal Swab     Status: None   Collection Time: 07/31/19 12:55 AM   Specimen: Nasopharyngeal Swab  Result Value Ref Range Status   SARS Coronavirus 2 NEGATIVE NEGATIVE Final    Comment: (NOTE) SARS-CoV-2 target nucleic acids are NOT DETECTED. The SARS-CoV-2 RNA is generally detectable in upper and lower respiratory specimens during the acute phase of infection. The lowest concentration of SARS-CoV-2 viral copies this assay can detect is 250 copies / mL. A negative result does not preclude SARS-CoV-2 infection and should not be used as the sole basis for treatment or other patient management decisions.  A negative result may occur with improper specimen collection / handling, submission of specimen other than nasopharyngeal swab, presence of viral mutation(s) within the areas targeted by this assay, and inadequate number of viral  copies (<250 copies / mL). A negative result must be combined with clinical observations, patient history, and epidemiological information. Fact Sheet for Patients:   StrictlyIdeas.no Fact Sheet for Healthcare Providers: BankingDealers.co.za This test is not yet approved or cleared  by the [place] FDA and has been authorized for detection and/or diagnosis of SARS-CoV-2 by FDA under an Emergency Use Authorization (EUA).  This EUA will remain in effect (meaning this test can be used) for  the duration of the COVID-19 declaration under Section 564(b)(1) of the Act, 21 U.S.C. section 360bbb-3(b)(1), unless the authorization is terminated or revoked sooner. Performed at [facility], [place] [address], [place], [place] [address]      Radiological Exams on Admission: DG Chest Portable 1 View  Result Date: 07/30/2019 CLINICAL DATA:  Chest pain EXAM: PORTABLE CHEST 1 VIEW COMPARISON:  07/21/2019 FINDINGS: Heart and mediastinal contours are within normal limits. No focal opacities or effusions. No acute bony abnormality. IMPRESSION: No active disease. Electronically Signed   By: Rolm Baptise M.D.   On: 07/30/2019 23:06    EKG: Independently reviewed.  Normal sinus rhythm.  Assessment/Plan Principal Problem:   Hypertensive urgency Active Problems:   Type 2 diabetes mellitus with hyperglycemia, with long-term current use of insulin (HCC)   Chest pain   Sleep apnea    1. Hypertensive urgency for which I have increased patient's hydralazine dose from 10 mg to 25 mg 3 times daily along with Coreg and hydrochlorothiazide will be continued.  As needed IV hydralazine for systolic blood pressure more than 160. 2. Chest pain/pressure likely from uncontrolled blood pressure.  However since patient has risk factors for ACS will consult cardiology.  Troponins have been negative.  Since patient has had previous history of DVT will check  D-dimer. 3. Diabetes mellitus type 2 on insulin takes Lantus 20 units twice daily since patient is kept n.p.o. we will decrease the Lantus by half the dose.  Follow CBGs closely. 4. Sleep apnea on CPAP. 5. Since patient drinks alcohol every day about 3 beers we will keep patient on CIWA. 6. Previous history of subdural hematoma.  Addendum -discussed with cardiologist Dr. Virgina Jock who advised to increase patient's Coreg to 12.5 mg from 6.25 twice daily continue hydrochlorothiazide and add amlodipine 10 mg daily.  Patient does have a history of angioedema to lisinopril.  Cardiology group will be calling patient from outpatient for follow-up with their office.  DVT prophylaxis: Lovenox. Code Status: Full code. Family Communication: Discussed with patient. Disposition Plan: Home. Consults called: Cardiology. Admission status: Observation.   Rise Patience MD [facility] Pager [phone].  If 7PM-7AM, please contact night-coverage [email or web address] Password [email or web address]  07/31/2019, 5:50 AM

## 2019-08-01 DIAGNOSIS — I160 Hypertensive urgency: Secondary | ICD-10-CM | POA: Diagnosis not present

## 2019-08-01 DIAGNOSIS — R079 Chest pain, unspecified: Secondary | ICD-10-CM | POA: Diagnosis not present

## 2019-08-01 DIAGNOSIS — Z794 Long term (current) use of insulin: Secondary | ICD-10-CM

## 2019-08-01 DIAGNOSIS — E1165 Type 2 diabetes mellitus with hyperglycemia: Secondary | ICD-10-CM | POA: Diagnosis not present

## 2019-08-01 DIAGNOSIS — G4730 Sleep apnea, unspecified: Secondary | ICD-10-CM | POA: Diagnosis not present

## 2019-08-01 LAB — CBC WITH DIFFERENTIAL/PLATELET
Abs Immature Granulocytes: 0.07 10*3/uL (ref 0.00–0.07)
Basophils Absolute: 0.1 10*3/uL (ref 0.0–0.1)
Basophils Relative: 1 %
Eosinophils Absolute: 0.2 10*3/uL (ref 0.0–0.5)
Eosinophils Relative: 2 %
HCT: 46 % (ref 39.0–52.0)
Hemoglobin: 15.3 g/dL (ref 13.0–17.0)
Immature Granulocytes: 1 %
Lymphocytes Relative: 17 %
Lymphs Abs: 1.8 10*3/uL (ref 0.7–4.0)
MCH: 29 pg (ref 26.0–34.0)
MCHC: 33.3 g/dL (ref 30.0–36.0)
MCV: 87.3 fL (ref 80.0–100.0)
Monocytes Absolute: 0.8 10*3/uL (ref 0.1–1.0)
Monocytes Relative: 8 %
Neutro Abs: 7.8 10*3/uL — ABNORMAL HIGH (ref 1.7–7.7)
Neutrophils Relative %: 71 %
Platelets: 301 10*3/uL (ref 150–400)
RBC: 5.27 MIL/uL (ref 4.22–5.81)
RDW: 13.2 % (ref 11.5–15.5)
WBC: 10.8 10*3/uL — ABNORMAL HIGH (ref 4.0–10.5)
nRBC: 0 % (ref 0.0–0.2)

## 2019-08-01 LAB — BASIC METABOLIC PANEL
Anion gap: 10 (ref 5–15)
BUN: 14 mg/dL (ref 8–23)
CO2: 28 mmol/L (ref 22–32)
Calcium: 8.9 mg/dL (ref 8.9–10.3)
Chloride: 94 mmol/L — ABNORMAL LOW (ref 98–111)
Creatinine, Ser: 0.75 mg/dL (ref 0.61–1.24)
GFR calc Af Amer: >60 mL/min (ref >60)
GFR calc non Af Amer: >60 mL/min (ref >60)
Glucose, Bld: 143 mg/dL — ABNORMAL HIGH (ref 70–99)
Potassium: 3.8 mmol/L (ref 3.5–5.1)
Sodium: 132 mmol/L — ABNORMAL LOW (ref 135–145)

## 2019-08-01 LAB — MAGNESIUM: Magnesium: 2 mg/dL (ref 1.7–2.4)

## 2019-08-01 LAB — GLUCOSE, CAPILLARY: Glucose-Capillary: 158 mg/dL — ABNORMAL HIGH (ref 70–99)

## 2019-08-01 MED ORDER — HYDRALAZINE HCL 20 MG/ML IJ SOLN
10.0000 mg | INTRAMUSCULAR | Status: DC | PRN
  Administered 2019-08-01 (×2): 10 mg via INTRAVENOUS
  Filled 2019-08-01 (×2): qty 1

## 2019-08-01 MED ORDER — FOLIC ACID 1 MG PO TABS: 1.0000 mg | ORAL_TABLET | Freq: Every day | ORAL | 0 refills | Status: DC

## 2019-08-01 MED ORDER — CARVEDILOL 12.5 MG PO TABS: 12.5000 mg | ORAL_TABLET | Freq: Two times a day (BID) | ORAL | 0 refills | Status: DC

## 2019-08-01 MED ORDER — THIAMINE HCL 100 MG PO TABS: 100.0000 mg | ORAL_TABLET | Freq: Every day | ORAL | 0 refills | Status: DC

## 2019-08-01 MED ORDER — AMLODIPINE BESYLATE 10 MG PO TABS: 10.0000 mg | ORAL_TABLET | Freq: Every day | ORAL | 0 refills | Status: DC

## 2019-08-01 NOTE — Discharge Summary (Signed)
Physician Discharge Summary  [name]. IWL:[id number] DOB: Jan 27, 1951 DOA: 07/30/2019  [facility]  Admit date: 07/30/2019 Discharge date: 08/01/2019  Admitted From: Home Disposition: Home  Recommendations for Outpatient Follow-up:  1. Follow up with PCP in 1 week with repeat CBC/BMP 2. Outpatient follow-up with cardiology 3. Follow up in ED if symptoms worsen or new appear   Home Health: No Equipment/Devices: None  Discharge Condition: Stable CODE STATUS: Full Diet recommendation: Heart healthy/carb modified  Brief/Interim Summary: 69 year old male with history of hypertension, diabetes mellitus type 2, previous history of DVT in 2006, history of subdural hematoma presented with elevated blood pressure.  In the ED, his blood pressure was more than 174 systolic for which he was given IV labetalol.  EKG showed nonspecific ST changes.  Covid test was negative.  He was put in observation for further management of his elevated blood pressure.  Cardiology/Dr. Virgina Jock was consulted on phone who recommended some medication changes and outpatient follow-up with him in the office.  Subsequently, his blood pressure has improved.  His symptoms has also improved.  He will be discharged home with outpatient follow-up with PCP and cardiology.  Discharge Diagnoses:   Hypertensive urgency -Presented with systolic blood pressure above 200 with some chest pressure.  Admitting hospitalist discussed with Dr. Patwardhan/cardiology who recommended that Coreg be increased to 12.5 mg twice a day, continue hydrochlorothiazide and add amlodipine 10 mg daily.  After making these changes, his blood pressure has improved but still on the high side.  Patient is to follow-up with his office as an outpatient.  Monitor blood pressure and follow-up with PCP within a week to follow blood pressure readings.  Chest pain/pressure -Probably from uncontrolled blood pressure.  EKG did not show any ST  elevations or depression.  Troponins negative.  Dr. Virgina Jock was of the opinion that patient can be safely followed up in his office as an outpatient and might need outpatient echocardiogram.  Obesity -Outpatient follow-up  Diabetes mellitus type II -Carb modified diet.  Continue home regimen  History of alcohol use -No signs of withdrawal.  Continue thiamine, folic acid and multivitamin on discharge  Previous history of subdural hematoma -Stable  Discharge Instructions  Discharge Instructions    Diet - low sodium heart healthy   Complete by: As directed    Diet Carb Modified   Complete by: As directed    Increase activity slowly   Complete by: As directed      Allergies as of 08/01/2019      Reactions   Ace Inhibitors    Angioedema per pt report to Tobias MD On 07/31/2019    Peanuts [peanut Oil] Shortness Of Breath   Swelling throat, sob    Tamiflu [oseltamivir Phosphate] Shortness Of Breath   Gabapentin Swelling   Lyrica [pregabalin] Swelling   Statins Other (See Comments)   Leg problems.   Avelox [moxifloxacin Hcl In Nacl] Rash      Medication List    STOP taking these medications   diazepam 5 MG tablet Commonly known as: Valium   HYDROmorphone 2 MG tablet Commonly known as: Dilaudid   naproxen 500 MG tablet Commonly known as: Naprosyn   ondansetron 4 MG tablet Commonly known as: Zofran   oxyCODONE-acetaminophen 10-325 MG tablet Commonly known as: Percocet     TAKE these medications   amLODipine 10 MG tablet Commonly known as: NORVASC Take 1 tablet (10 mg total) by mouth daily. Start taking on: August 02, 2019  aspirin 81 MG chewable tablet Chew 81 mg by mouth daily.   carvedilol 12.5 MG tablet Commonly known as: COREG Take 1 tablet (12.5 mg total) by mouth 2 (two) times daily with a meal. What changed: how much to take   DIALYVITE VITAMIN D 5000 PO Take 5,000 Units by mouth every Monday, Wednesday, and Friday. Takes at night   ECHINACEA  PO Take 1 tablet by mouth daily.   FISH OIL PO Take 1 capsule by mouth every evening.   fluticasone 50 MCG/ACT nasal spray Commonly known as: FLONASE Place 1 spray into both nostrils in the morning and at bedtime.   folic acid 1 MG tablet Commonly known as: FOLVITE Take 1 tablet (1 mg total) by mouth daily. Start taking on: August 02, 2019   hydrochlorothiazide 25 MG tablet Commonly known as: HYDRODIURIL Take 25 mg by mouth every morning.   insulin aspart 100 UNIT/ML injection Commonly known as: novoLOG Inject 8 Units into the skin 3 (three) times daily with meals.   insulin glargine 100 UNIT/ML injection Commonly known as: LANTUS Inject 20 Units into the skin 2 (two) times daily.   LACTOBACILLUS BIFIDUS PO Take 1 tablet by mouth daily.   magnesium oxide 400 MG tablet Commonly known as: MAG-OX Take 400 mg by mouth at bedtime.   multivitamin with minerals Tabs tablet Take 1 tablet by mouth every evening.   Opcon-A 0.027-0.315 % Soln Generic drug: Naphazoline-Pheniramine Apply 1 drop to eye daily as needed (dry eyes).   OVER THE COUNTER MEDICATION Take 1 capsule by mouth at bedtime. Oil of Oregano   potassium gluconate 595 (99 K) MG Tabs tablet Take 595 mg by mouth every evening.   terazosin 5 MG capsule Commonly known as: HYTRIN Take 5 mg by mouth 2 (two) times daily.   thiamine 100 MG tablet Take 1 tablet (100 mg total) by mouth daily. Start taking on: August 02, 2019   TURMERIC PO Take 1 tablet by mouth every evening.      Follow-up Information    [facility] Schedule an appointment as soon as possible for a visit in 1 week(s).   Why: with repeat cbc/bmp Contact information: [place] [phone]        Nigel Mormon, MD. Schedule an appointment as soon as possible for a visit in 1 week(s).   Specialties: Cardiology, Radiology Contact information: [place]  [phone]          Allergies  Allergen Reactions  . Ace Inhibitors     Angioedema per pt report to admiting MD On 07/31/2019   . Peanuts [Peanut Oil] Shortness Of Breath    Swelling throat, sob   . Tamiflu [Oseltamivir Phosphate] Shortness Of Breath  . Gabapentin Swelling  . Lyrica [Pregabalin] Swelling  . Statins Other (See Comments)    Leg problems.  . Avelox [Moxifloxacin Hcl In Nacl] Rash    Consultations:  Cardiology/Dr. Virgina Jock on phone by admitting hospitalist   Procedures/Studies: DG Chest 2 View  Result Date: 07/21/2019 CLINICAL DATA:  Hypertension EXAM: CHEST - 2 VIEW COMPARISON:  05/20/2015 FINDINGS: The heart size and mediastinal contours are within normal limits. Both lungs are clear. Degenerative changes of the spine. IMPRESSION: No active cardiopulmonary disease. Electronically Signed   By: Donavan Foil M.D.   On: 07/21/2019 21:14   DG Chest Portable 1 View  Result Date: 07/30/2019 CLINICAL DATA:  Chest pain EXAM: PORTABLE CHEST 1 VIEW  COMPARISON:  07/21/2019 FINDINGS: Heart and mediastinal contours are within normal limits. No focal opacities or effusions. No acute bony abnormality. IMPRESSION: No active disease. Electronically Signed   By: Rolm Baptise M.D.   On: 07/30/2019 23:06       Subjective: Patient seen and examined at bedside.  He denies any current chest pain, worsening shortness breath, nausea, vomiting.  Feels okay to go home.  Discharge Exam: Vitals:   08/01/19 0831 08/01/19 0835  BP: (!) 179/68 (!) 179/68  Pulse: 72 70  Resp:  15  Temp:  98.4 F (36.9 C)  SpO2:  97%    General: Pt is alert, awake, not in acute distress Cardiovascular: rate controlled, S1/S2 + Respiratory: bilateral decreased breath sounds at bases Abdominal: Soft, obese, NT, ND, bowel sounds + Extremities: no edema, no cyanosis    The results of significant diagnostics from this hospitalization (including imaging, microbiology, ancillary and  laboratory) are listed below for reference.     Microbiology: Recent Results (from the past 240 hour(s))  SARS Coronavirus 2 by RT PCR (hospital order, performed in [facility] hospital lab) Nasopharyngeal Nasopharyngeal Swab     Status: None   Collection Time: 07/31/19 12:55 AM   Specimen: Nasopharyngeal Swab  Result Value Ref Range Status   SARS Coronavirus 2 NEGATIVE NEGATIVE Final    Comment: (NOTE) SARS-CoV-2 target nucleic acids are NOT DETECTED. The SARS-CoV-2 RNA is generally detectable in upper and lower respiratory specimens during the acute phase of infection. The lowest concentration of SARS-CoV-2 viral copies this assay can detect is 250 copies / mL. A negative result does not preclude SARS-CoV-2 infection and should not be used as the sole basis for treatment or other patient management decisions.  A negative result may occur with improper specimen collection / handling, submission of specimen other than nasopharyngeal swab, presence of viral mutation(s) within the areas targeted by this assay, and inadequate number of viral copies (<250 copies / mL). A negative result must be combined with clinical observations, patient history, and epidemiological information. Fact Sheet for Patients:   StrictlyIdeas.no Fact Sheet for Healthcare Providers: BankingDealers.co.za This test is not yet approved or cleared  by the [place] FDA and has been authorized for detection and/or diagnosis of SARS-CoV-2 by FDA under an Emergency Use Authorization (EUA).  This EUA will remain in effect (meaning this test can be used) for the duration of the COVID-19 declaration under Section 564(b)(1) of the Act, 21 U.S.C. section 360bbb-3(b)(1), unless the authorization is terminated or revoked sooner. Performed at [facility], [place] [address], [place], [place] [address]      Labs: BNP (last 3 results) No results for  input(s): BNP in the last 8760 hours. Basic Metabolic Panel: Recent Labs  Lab 07/30/19 2306 07/31/19 0704 08/01/19 0456  NA 136 133* 132*  K 3.7 3.8 3.8  CL 96* 95* 94*  CO2 29 27 28   GLUCOSE 167* 154* 143*  BUN 14 12 14   CREATININE 0.69 0.65 0.75  CALCIUM 9.0 8.8* 8.9  MG  --   --  2.0   Liver Function Tests: Recent Labs  Lab 07/30/19 2306 07/31/19 0704  AST 19 20  ALT 25 24  ALKPHOS 73 75  BILITOT 0.8 1.0  PROT 7.7 7.2  ALBUMIN 4.1 3.9   No results for input(s): LIPASE, AMYLASE in the last 168 hours. No results for input(s): AMMONIA in the last 168 hours. CBC: Recent Labs  Lab 07/30/19 2306 07/31/19 0704 08/01/19  0456  WBC 10.3 10.2 10.8*  NEUTROABS 7.6 7.8* 7.8*  HGB 16.1 15.5 15.3  HCT 46.9 46.1 46.0  MCV 87.5 87.0 87.3  PLT 309 321 301   Cardiac Enzymes: No results for input(s): CKTOTAL, CKMB, CKMBINDEX, TROPONINI in the last 168 hours. BNP: Invalid input(s): POCBNP CBG: Recent Labs  Lab 07/31/19 1137 07/31/19 1541 07/31/19 1656 07/31/19 2050 08/01/19 0809  GLUCAP 180* 140* 144* 171* 158*   D-Dimer Recent Labs    07/31/19 0704  DDIMER 0.46   Hgb A1c No results for input(s): HGBA1C in the last 72 hours. Lipid Profile No results for input(s): CHOL, HDL, LDLCALC, TRIG, CHOLHDL, LDLDIRECT in the last 72 hours. Thyroid function studies No results for input(s): TSH, T4TOTAL, T3FREE, THYROIDAB in the last 72 hours.  Invalid input(s): FREET3 Anemia work up No results for input(s): VITAMINB12, FOLATE, FERRITIN, TIBC, IRON, RETICCTPCT in the last 72 hours. Urinalysis    Component Value Date/Time   COLORURINE YELLOW 12/21/2012 1341   APPEARANCEUR CLEAR 12/21/2012 1341   LABSPEC 1.017 12/21/2012 1341   PHURINE 6.5 12/21/2012 1341   GLUCOSEU NEGATIVE 12/21/2012 1341   HGBUR NEGATIVE 12/21/2012 1341   BILIRUBINUR NEGATIVE 12/21/2012 1341   KETONESUR NEGATIVE 12/21/2012 1341   PROTEINUR NEGATIVE 12/21/2012 1341   UROBILINOGEN 1.0 12/21/2012  1341   NITRITE NEGATIVE 12/21/2012 1341   LEUKOCYTESUR NEGATIVE 12/21/2012 1341   Sepsis Labs Invalid input(s): PROCALCITONIN,  WBC,  LACTICIDVEN Microbiology Recent Results (from the past 240 hour(s))  SARS Coronavirus 2 by RT PCR (hospital order, performed in [facility] hospital lab) Nasopharyngeal Nasopharyngeal Swab     Status: None   Collection Time: 07/31/19 12:55 AM   Specimen: Nasopharyngeal Swab  Result Value Ref Range Status   SARS Coronavirus 2 NEGATIVE NEGATIVE Final    Comment: (NOTE) SARS-CoV-2 target nucleic acids are NOT DETECTED. The SARS-CoV-2 RNA is generally detectable in upper and lower respiratory specimens during the acute phase of infection. The lowest concentration of SARS-CoV-2 viral copies this assay can detect is 250 copies / mL. A negative result does not preclude SARS-CoV-2 infection and should not be used as the sole basis for treatment or other patient management decisions.  A negative result may occur with improper specimen collection / handling, submission of specimen other than nasopharyngeal swab, presence of viral mutation(s) within the areas targeted by this assay, and inadequate number of viral copies (<250 copies / mL). A negative result must be combined with clinical observations, patient history, and epidemiological information. Fact Sheet for Patients:   StrictlyIdeas.no Fact Sheet for Healthcare Providers: BankingDealers.co.za This test is not yet approved or cleared  by the [place] FDA and has been authorized for detection and/or diagnosis of SARS-CoV-2 by FDA under an Emergency Use Authorization (EUA).  This EUA will remain in effect (meaning this test can be used) for the duration of the COVID-19 declaration under Section 564(b)(1) of the Act, 21 U.S.C. section 360bbb-3(b)(1), unless the authorization is terminated or revoked sooner. Performed at [facility],  [place] [address] [place], [place] [address]      Time coordinating discharge: 35 minutes  SIGNED:   Aline August, MD  [facility] 08/01/2019, 10:10 AM

## 2019-08-06 ENCOUNTER — Ambulatory Visit: Payer: Self-pay | Admitting: Cardiology

## 2019-08-07 ENCOUNTER — Ambulatory Visit: Payer: Self-pay | Admitting: Cardiology

## 2019-09-07 ENCOUNTER — Inpatient Hospital Stay (HOSPITAL_COMMUNITY): Payer: No Typology Code available for payment source

## 2019-09-07 ENCOUNTER — Inpatient Hospital Stay (HOSPITAL_COMMUNITY)
Admission: EM | Admit: 2019-09-07 | Discharge: 2019-09-10 | DRG: 392 | Disposition: A | Discharge status: Home / Self Care | Payer: No Typology Code available for payment source | Attending: Family Medicine | Admitting: Family Medicine

## 2019-09-07 ENCOUNTER — Other Ambulatory Visit: Payer: Self-pay

## 2019-09-07 ENCOUNTER — Emergency Department (HOSPITAL_COMMUNITY): Payer: No Typology Code available for payment source

## 2019-09-07 DIAGNOSIS — E785 Hyperlipidemia, unspecified: Secondary | ICD-10-CM | POA: Diagnosis present

## 2019-09-07 DIAGNOSIS — Z20822 Contact with and (suspected) exposure to covid-19: Secondary | ICD-10-CM | POA: Diagnosis present

## 2019-09-07 DIAGNOSIS — E860 Dehydration: Secondary | ICD-10-CM | POA: Diagnosis present

## 2019-09-07 DIAGNOSIS — R079 Chest pain, unspecified: Secondary | ICD-10-CM | POA: Diagnosis not present

## 2019-09-07 DIAGNOSIS — E1151 Type 2 diabetes mellitus with diabetic peripheral angiopathy without gangrene: Secondary | ICD-10-CM | POA: Diagnosis present

## 2019-09-07 DIAGNOSIS — E871 Hypo-osmolality and hyponatremia: Secondary | ICD-10-CM | POA: Diagnosis present

## 2019-09-07 DIAGNOSIS — Z6834 Body mass index (BMI) 34.0-34.9, adult: Secondary | ICD-10-CM

## 2019-09-07 DIAGNOSIS — Z86718 Personal history of other venous thrombosis and embolism: Secondary | ICD-10-CM

## 2019-09-07 DIAGNOSIS — K5792 Diverticulitis of intestine, part unspecified, without perforation or abscess without bleeding: Secondary | ICD-10-CM | POA: Diagnosis present

## 2019-09-07 DIAGNOSIS — K56699 Other intestinal obstruction unspecified as to partial versus complete obstruction: Secondary | ICD-10-CM

## 2019-09-07 DIAGNOSIS — M79609 Pain in unspecified limb: Secondary | ICD-10-CM | POA: Diagnosis not present

## 2019-09-07 DIAGNOSIS — E1165 Type 2 diabetes mellitus with hyperglycemia: Secondary | ICD-10-CM | POA: Diagnosis present

## 2019-09-07 DIAGNOSIS — Z7982 Long term (current) use of aspirin: Secondary | ICD-10-CM | POA: Diagnosis not present

## 2019-09-07 DIAGNOSIS — Z7289 Other problems related to lifestyle: Secondary | ICD-10-CM | POA: Diagnosis not present

## 2019-09-07 DIAGNOSIS — I350 Nonrheumatic aortic (valve) stenosis: Secondary | ICD-10-CM | POA: Diagnosis not present

## 2019-09-07 DIAGNOSIS — Z794 Long term (current) use of insulin: Secondary | ICD-10-CM

## 2019-09-07 DIAGNOSIS — G4730 Sleep apnea, unspecified: Secondary | ICD-10-CM | POA: Diagnosis present

## 2019-09-07 DIAGNOSIS — K5900 Constipation, unspecified: Secondary | ICD-10-CM | POA: Diagnosis present

## 2019-09-07 DIAGNOSIS — R935 Abnormal findings on diagnostic imaging of other abdominal regions, including retroperitoneum: Secondary | ICD-10-CM | POA: Diagnosis not present

## 2019-09-07 DIAGNOSIS — K5732 Diverticulitis of large intestine without perforation or abscess without bleeding: Principal | ICD-10-CM | POA: Diagnosis present

## 2019-09-07 DIAGNOSIS — Z833 Family history of diabetes mellitus: Secondary | ICD-10-CM

## 2019-09-07 DIAGNOSIS — R0789 Other chest pain: Secondary | ICD-10-CM | POA: Diagnosis present

## 2019-09-07 DIAGNOSIS — E11649 Type 2 diabetes mellitus with hypoglycemia without coma: Secondary | ICD-10-CM | POA: Diagnosis not present

## 2019-09-07 DIAGNOSIS — D689 Coagulation defect, unspecified: Secondary | ICD-10-CM | POA: Diagnosis present

## 2019-09-07 DIAGNOSIS — G4733 Obstructive sleep apnea (adult) (pediatric): Secondary | ICD-10-CM | POA: Diagnosis present

## 2019-09-07 DIAGNOSIS — Z888 Allergy status to other drugs, medicaments and biological substances status: Secondary | ICD-10-CM | POA: Diagnosis not present

## 2019-09-07 DIAGNOSIS — Z79899 Other long term (current) drug therapy: Secondary | ICD-10-CM

## 2019-09-07 DIAGNOSIS — Z91010 Allergy to peanuts: Secondary | ICD-10-CM

## 2019-09-07 DIAGNOSIS — M7062 Trochanteric bursitis, left hip: Secondary | ICD-10-CM | POA: Diagnosis present

## 2019-09-07 DIAGNOSIS — Z87891 Personal history of nicotine dependence: Secondary | ICD-10-CM

## 2019-09-07 DIAGNOSIS — I10 Essential (primary) hypertension: Secondary | ICD-10-CM | POA: Diagnosis present

## 2019-09-07 DIAGNOSIS — Z881 Allergy status to other antibiotic agents status: Secondary | ICD-10-CM | POA: Diagnosis not present

## 2019-09-07 DIAGNOSIS — E669 Obesity, unspecified: Secondary | ICD-10-CM | POA: Diagnosis present

## 2019-09-07 HISTORY — DX: Other intestinal obstruction unspecified as to partial versus complete obstruction: K56.699

## 2019-09-07 LAB — COMPREHENSIVE METABOLIC PANEL
ALT: 43 U/L (ref 0–44)
AST: 39 U/L (ref 15–41)
Albumin: 4.4 g/dL (ref 3.5–5.0)
Alkaline Phosphatase: 66 U/L (ref 38–126)
Anion gap: 12 (ref 5–15)
BUN: 14 mg/dL (ref 8–23)
CO2: 27 mmol/L (ref 22–32)
Calcium: 9.4 mg/dL (ref 8.9–10.3)
Chloride: 93 mmol/L — ABNORMAL LOW (ref 98–111)
Creatinine, Ser: 0.87 mg/dL (ref 0.61–1.24)
GFR calc Af Amer: >60 mL/min (ref >60)
GFR calc non Af Amer: >60 mL/min (ref >60)
Glucose, Bld: 109 mg/dL — ABNORMAL HIGH (ref 70–99)
Potassium: 3.8 mmol/L (ref 3.5–5.1)
Sodium: 132 mmol/L — ABNORMAL LOW (ref 135–145)
Total Bilirubin: 1 mg/dL (ref 0.3–1.2)
Total Protein: 8 g/dL (ref 6.5–8.1)

## 2019-09-07 LAB — CBC WITH DIFFERENTIAL/PLATELET
Abs Immature Granulocytes: 0.02 10*3/uL (ref 0.00–0.07)
Basophils Absolute: 0.1 10*3/uL (ref 0.0–0.1)
Basophils Relative: 1 %
Eosinophils Absolute: 0.1 10*3/uL (ref 0.0–0.5)
Eosinophils Relative: 2 %
HCT: 45.6 % (ref 39.0–52.0)
Hemoglobin: 15.3 g/dL (ref 13.0–17.0)
Immature Granulocytes: 0 %
Lymphocytes Relative: 21 %
Lymphs Abs: 1.9 10*3/uL (ref 0.7–4.0)
MCH: 29.4 pg (ref 26.0–34.0)
MCHC: 33.6 g/dL (ref 30.0–36.0)
MCV: 87.5 fL (ref 80.0–100.0)
Monocytes Absolute: 0.8 10*3/uL (ref 0.1–1.0)
Monocytes Relative: 8 %
Neutro Abs: 6.2 10*3/uL (ref 1.7–7.7)
Neutrophils Relative %: 68 %
Platelets: 290 10*3/uL (ref 150–400)
RBC: 5.21 MIL/uL (ref 4.22–5.81)
RDW: 13.6 % (ref 11.5–15.5)
WBC: 9 10*3/uL (ref 4.0–10.5)
nRBC: 0 % (ref 0.0–0.2)

## 2019-09-07 LAB — CBG MONITORING, ED
Glucose-Capillary: 131 mg/dL — ABNORMAL HIGH (ref 70–99)
Glucose-Capillary: 99 mg/dL (ref 70–99)

## 2019-09-07 LAB — LIPASE, BLOOD: Lipase: 21 U/L (ref 11–51)

## 2019-09-07 LAB — TROPONIN I (HIGH SENSITIVITY)
Troponin I (High Sensitivity): 6 ng/L (ref <18)
Troponin I (High Sensitivity): 5 ng/L (ref <18)

## 2019-09-07 LAB — SARS CORONAVIRUS 2 BY RT PCR (HOSPITAL ORDER, PERFORMED IN ~~LOC~~ HOSPITAL LAB): SARS Coronavirus 2: NEGATIVE

## 2019-09-07 MED ORDER — DOCUSATE SODIUM 100 MG PO CAPS
100.0000 mg | ORAL_CAPSULE | Freq: Two times a day (BID) | ORAL | Status: DC
  Administered 2019-09-07 – 2019-09-09 (×5): 100 mg via ORAL
  Filled 2019-09-07 (×6): qty 1

## 2019-09-07 MED ORDER — PIPERACILLIN-TAZOBACTAM 3.375 G IVPB
3.3750 g | Freq: Three times a day (TID) | INTRAVENOUS | Status: DC
  Administered 2019-09-08 – 2019-09-09 (×5): 3.375 g via INTRAVENOUS
  Filled 2019-09-07 (×5): qty 50

## 2019-09-07 MED ORDER — SODIUM CHLORIDE 0.9 % IV BOLUS
500.0000 mL | Freq: Once | INTRAVENOUS | Status: AC
  Administered 2019-09-07: 500 mL via INTRAVENOUS

## 2019-09-07 MED ORDER — MORPHINE SULFATE (PF) 2 MG/ML IV SOLN
2.0000 mg | INTRAVENOUS | Status: DC | PRN
  Administered 2019-09-07 – 2019-09-08 (×4): 2 mg via INTRAVENOUS
  Filled 2019-09-07 (×4): qty 1

## 2019-09-07 MED ORDER — ACETAMINOPHEN 325 MG PO TABS
650.0000 mg | ORAL_TABLET | Freq: Four times a day (QID) | ORAL | Status: DC | PRN
  Administered 2019-09-08 – 2019-09-10 (×4): 650 mg via ORAL
  Filled 2019-09-07 (×4): qty 2

## 2019-09-07 MED ORDER — TERAZOSIN HCL 5 MG PO CAPS
5.0000 mg | ORAL_CAPSULE | Freq: Two times a day (BID) | ORAL | Status: DC
  Administered 2019-09-07 – 2019-09-10 (×6): 5 mg via ORAL
  Filled 2019-09-07 (×7): qty 1

## 2019-09-07 MED ORDER — POLYETHYLENE GLYCOL 3350 17 G PO PACK: 17.0000 g | PACK | Freq: Every day | ORAL | Status: DC | PRN

## 2019-09-07 MED ORDER — MORPHINE SULFATE (PF) 4 MG/ML IV SOLN
4.0000 mg | Freq: Once | INTRAVENOUS | Status: AC
  Administered 2019-09-07: 4 mg via INTRAVENOUS
  Filled 2019-09-07: qty 1

## 2019-09-07 MED ORDER — SODIUM CHLORIDE 0.9% FLUSH
3.0000 mL | Freq: Two times a day (BID) | INTRAVENOUS | Status: DC
  Administered 2019-09-08 – 2019-09-10 (×5): 3 mL via INTRAVENOUS

## 2019-09-07 MED ORDER — ENOXAPARIN SODIUM 40 MG/0.4ML ~~LOC~~ SOLN
40.0000 mg | SUBCUTANEOUS | Status: DC
  Administered 2019-09-07: 40 mg via SUBCUTANEOUS
  Filled 2019-09-07: qty 0.4

## 2019-09-07 MED ORDER — CARVEDILOL 12.5 MG PO TABS
12.5000 mg | ORAL_TABLET | Freq: Two times a day (BID) | ORAL | Status: DC
  Administered 2019-09-07 – 2019-09-10 (×6): 12.5 mg via ORAL
  Filled 2019-09-07 (×6): qty 1

## 2019-09-07 MED ORDER — CIPROFLOXACIN IN D5W 400 MG/200ML IV SOLN
400.0000 mg | Freq: Once | INTRAVENOUS | Status: AC
  Administered 2019-09-07: 400 mg via INTRAVENOUS
  Filled 2019-09-07: qty 200

## 2019-09-07 MED ORDER — ONDANSETRON HCL 4 MG/2ML IJ SOLN: 4.0000 mg | Freq: Four times a day (QID) | INTRAMUSCULAR | Status: DC | PRN

## 2019-09-07 MED ORDER — PIPERACILLIN-TAZOBACTAM 3.375 G IVPB 30 MIN: 3.3750 g | Freq: Once | INTRAVENOUS | Status: DC

## 2019-09-07 MED ORDER — THIAMINE HCL 100 MG PO TABS
100.0000 mg | ORAL_TABLET | Freq: Every day | ORAL | Status: DC
  Administered 2019-09-07 – 2019-09-10 (×4): 100 mg via ORAL
  Filled 2019-09-07 (×4): qty 1

## 2019-09-07 MED ORDER — SENNA 8.6 MG PO TABS
1.0000 | ORAL_TABLET | Freq: Two times a day (BID) | ORAL | Status: DC
  Administered 2019-09-07: 8.6 mg via ORAL
  Filled 2019-09-07: qty 1

## 2019-09-07 MED ORDER — HYDROCODONE-ACETAMINOPHEN 5-325 MG PO TABS
1.0000 | ORAL_TABLET | ORAL | Status: DC | PRN
  Filled 2019-09-07: qty 1

## 2019-09-07 MED ORDER — ACETAMINOPHEN 650 MG RE SUPP: 650.0000 mg | Freq: Four times a day (QID) | RECTAL | Status: DC | PRN

## 2019-09-07 MED ORDER — ONDANSETRON HCL 4 MG PO TABS: 4.0000 mg | ORAL_TABLET | Freq: Four times a day (QID) | ORAL | Status: DC | PRN

## 2019-09-07 MED ORDER — SODIUM CHLORIDE 0.9 % IV SOLN: INTRAVENOUS | Status: DC

## 2019-09-07 MED ORDER — INSULIN ASPART 100 UNIT/ML ~~LOC~~ SOLN
0.0000 [IU] | SUBCUTANEOUS | Status: DC
  Administered 2019-09-07: 1 [IU] via SUBCUTANEOUS
  Administered 2019-09-08: 2 [IU] via SUBCUTANEOUS
  Administered 2019-09-09 (×2): 1 [IU] via SUBCUTANEOUS
  Filled 2019-09-07: qty 0.09

## 2019-09-07 MED ORDER — METRONIDAZOLE IN NACL 5-0.79 MG/ML-% IV SOLN
500.0000 mg | Freq: Once | INTRAVENOUS | Status: AC
  Administered 2019-09-07: 500 mg via INTRAVENOUS
  Filled 2019-09-07: qty 100

## 2019-09-07 MED ORDER — INSULIN GLARGINE 100 UNIT/ML ~~LOC~~ SOLN
20.0000 [IU] | Freq: Two times a day (BID) | SUBCUTANEOUS | Status: DC
  Administered 2019-09-07 – 2019-09-10 (×6): 20 [IU] via SUBCUTANEOUS
  Filled 2019-09-07 (×8): qty 0.2

## 2019-09-07 MED ORDER — BISACODYL 5 MG PO TBEC: 5.0000 mg | DELAYED_RELEASE_TABLET | Freq: Every day | ORAL | Status: DC | PRN

## 2019-09-07 MED ORDER — IOHEXOL 300 MG/ML  SOLN
100.0000 mL | Freq: Once | INTRAMUSCULAR | Status: AC | PRN
  Administered 2019-09-07: 100 mL via INTRAVENOUS

## 2019-09-07 MED ORDER — AMLODIPINE BESYLATE 10 MG PO TABS
10.0000 mg | ORAL_TABLET | Freq: Every day | ORAL | Status: DC
  Administered 2019-09-07 – 2019-09-10 (×4): 10 mg via ORAL
  Filled 2019-09-07 (×2): qty 1
  Filled 2019-09-07: qty 2
  Filled 2019-09-07: qty 1

## 2019-09-07 NOTE — Progress Notes (Signed)
Pharmacy Antibiotic Note  [name]. is a 69 y.o. male admitted on 09/07/2019 with intra-abdominal infection.  Pharmacy has been consulted for piperacillin/tazobactam dosing.  Assessment: -SCr 0.87, CrCl >60 mL/min -WBC WNL  Plan:  Piperacillin/tazobactam 3.375 g IV q8h EI  Height: 6\' 2"  (188 cm) Weight: 121.1 kg (267 lb) IBW/kg (Calculated) : 82.2  Temp (24hrs), Avg:98 F (36.7 C), Min:98 F (36.7 C), Max:98 F (36.7 C)  Recent Labs  Lab 09/07/19 1512  WBC 9.0  CREATININE 0.87    Estimated Creatinine Clearance: 110.9 mL/min (by C-G formula based on SCr of 0.87 mg/dL).    Allergies  Allergen Reactions  . Ace Inhibitors     Angioedema per pt report to admiting MD On 07/31/2019   . Peanuts [Peanut Oil] Shortness Of Breath    Swelling throat, sob   . Tamiflu [Oseltamivir Phosphate] Shortness Of Breath  . Gabapentin Swelling  . Lyrica [Pregabalin] Swelling  . Statins Other (See Comments)    Leg problems.  . Avelox [Moxifloxacin Hcl In Nacl] Rash    Antimicrobials this admission: Piperacillin/tazobactam 7/11 >>  Dose adjustments this admission:  Microbiology results:  Thank you for allowing pharmacy to be a part of this patient's care.  Lenis Noon, PharmD 09/07/2019 9:40 PM

## 2019-09-07 NOTE — ED Provider Notes (Signed)
[facility] DEPT Provider Note   CSN: [id number] Arrival date & time: 09/07/19  1223     History Chief Complaint  Patient presents with  . Abdominal Pain    [name]. is a 69 y.o. male.  HPI    Patient with multiple medical issues including prior diverticulitis now presents with ongoing abdominal pain. This episode began about 2 weeks ago, has been focally in the left lower quadrant with severe pain.  Pain is not changed since he saw his physician 2 days ago, was started on ciprofloxacin, Flagyl. No associated vomiting, there is nausea.  No chest pain, no dyspnea.  No diarrhea.  Past Medical History:  Diagnosis Date  . Cataract   . Clotting disorder ([place])    DVT in 2006 x1; no problems since  . Diabetes mellitus   . Diverticulitis   . Hypertension   . Peripheral vascular disease ([place]) 2006   dvt 's  . Pneumonia 06  . Sleep apnea    cpap started 2 months ago set on 4  . 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 Hyponatremia 09/07/2019  . Diverticulitis 09/07/2019  . Constipated 09/07/2019  . Dehydration 09/07/2019  . Constipation 09/07/2019  . Abnormal CT of the abdomen 09/07/2019  . Hypertensive urgency 07/31/2019  . Chest pain 07/31/2019  . Sleep apnea 07/31/2019  . S/P rotator cuff repair 05/20/2015  . Shortness of breath   . Essential hypertension, benign   . Dyspepsia   . Type 2 diabetes mellitus with hyperglycemia, with long-term current use of insulin [facility])     Past Surgical History:  Procedure Laterality Date  . BRAIN SURGERY  2010   subdural hematoma -bleed  . DG THUMB LEFT HAND Left    "gamers "  . EYE SURGERY Right 12/16   cataract  . ROTATOR CUFF REPAIR W/ DISTAL CLAVICLE EXCISION Left 05/20/2015  . SHOULDER ARTHROSCOPY WITH SUBACROMIAL DECOMPRESSION, ROTATOR CUFF REPAIR AND BICEP TENDON REPAIR Left 05/20/2015   Procedure: LEFT SHOULDER ARTHROSCOPY WITH  SUBACROMIAL DECOMPRESSION,DISTAL CLAVICAL RESECTION  ROTATOR CUFF REPAIR ;  Surgeon: Justice Britain, MD;  Location: [place];  Service: Orthopedics;  Laterality: Left;  . TONSILLECTOMY         Family History  Problem Relation Age of Onset  . Stroke Father   . Diabetes Brother     Social History   Tobacco Use  . Smoking status: Former Smoker    Packs/day: 1.00    Years: 15.00    Pack years: 15.00    Types: Cigarettes    Quit date: 05/06/1981    Years since quitting: 38.3  . Smokeless tobacco: Never Used  Substance Use Topics  . Alcohol use: Yes    Alcohol/week: 10.0 standard drinks    Types: 10 Cans of beer per week    Comment: mostly weekend  . Drug use: No    Home Medications Prior to Admission medications   Medication Sig Start Date End Date Taking? Authorizing Provider  amLODipine (NORVASC) 10 MG tablet Take 1 tablet (10 mg total) by mouth daily. 08/02/19  Yes Aline August, MD  aspirin 81 MG chewable tablet Chew 81 mg by mouth every evening.    Yes [provider]  Azelastine HCl 137 MCG/SPRAY SOLN Place 1 spray into both nostrils 2 (two) times daily.  06/20/19  Yes [provider]  carvedilol (COREG) 12.5 MG tablet Take 1 tablet (12.5  mg total) by mouth 2 (two) times daily with a meal. 08/01/19  Yes Aline August, MD  Cholecalciferol (DIALYVITE VITAMIN D 5000 PO) Take 5,000 Units by mouth every Monday, Wednesday, and Friday. Takes at night   Yes [provider]  ECHINACEA PO Take 1 tablet by mouth daily.   Yes [provider]  hydrochlorothiazide (HYDRODIURIL) 25 MG tablet Take 25 mg by mouth every morning. 11/28/17  Yes [provider]  insulin aspart (NOVOLOG) 100 UNIT/ML injection Inject 0-8 Units into the skin 3 (three) times daily as needed for high blood sugar.    Yes [provider]  insulin glargine (LANTUS) 100 UNIT/ML injection Inject 20 Units into the skin 2 (two) times daily.    Yes [provider]   LACTOBACILLUS BIFIDUS PO Take 1 tablet by mouth daily.    Yes [provider]  magnesium oxide (MAG-OX) 400 MG tablet Take 400 mg by mouth at bedtime.   Yes [provider]  metroNIDAZOLE (FLAGYL) 500 MG tablet Take 500 mg by mouth 3 (three) times daily.   Yes [provider]  Multiple Vitamin (MULTIVITAMIN WITH MINERALS) TABS Take 1 tablet by mouth every evening.    Yes [provider]  Naphazoline-Pheniramine (OPCON-A) 0.027-0.315 % SOLN Apply 1 drop to eye daily as needed (dry eyes).   Yes [provider]  Omega-3 Fatty Acids (FISH OIL PO) Take 1 capsule by mouth every evening.    Yes [provider]  OVER THE COUNTER MEDICATION Take 1 capsule by mouth at bedtime. Oil of [place]   Yes [provider]  potassium gluconate 595 MG TABS Take 595 mg by mouth every evening.    Yes [provider]  terazosin (HYTRIN) 5 MG capsule Take 5 mg by mouth 2 (two) times daily.    Yes [provider]  TURMERIC PO Take 1 tablet by mouth every evening.    Yes [provider]    Allergies    Ace inhibitors, Peanuts [peanut oil], Tamiflu [oseltamivir phosphate], Gabapentin, Lyrica [pregabalin], Statins, and Avelox [moxifloxacin hcl in nacl]  Review of Systems   Review of Systems  Constitutional:       Per HPI, otherwise negative  HENT:       Per HPI, otherwise negative  Respiratory:       Per HPI, otherwise negative  Cardiovascular:       Per HPI, otherwise negative  Gastrointestinal: Negative for vomiting.  Endocrine:       Negative aside from HPI  Genitourinary:       Neg aside from HPI   Musculoskeletal:       Per HPI, otherwise negative  Skin: Negative.   Neurological: Negative for syncope.    Physical Exam Updated Vital Signs BP (!) 177/80   Pulse 75   Temp 98 F (36.7 C) (Oral)   Resp 18   Ht 6\' 2"  (1.88 m)   Wt 121.1 kg   SpO2 98%   BMI 34.28 kg/m   Physical Exam Vitals and nursing note  reviewed.  Constitutional:      General: He is not in acute distress.    Appearance: He is well-developed.  HENT:     Head: Normocephalic and atraumatic.  Eyes:     Conjunctiva/sclera: Conjunctivae normal.  Cardiovascular:     Rate and Rhythm: Normal rate and regular rhythm.  Pulmonary:     Effort: Pulmonary effort is normal. No respiratory distress.     Breath sounds: No  stridor.  Abdominal:     General: There is no distension.     Tenderness: There is abdominal tenderness in the left lower quadrant.  Skin:    General: Skin is warm and dry.  Neurological:     Mental Status: He is alert and oriented to person, place, and time.      ED Results / Procedures / Treatments   Labs (all labs ordered are listed, but only abnormal results are displayed) Labs Reviewed  COMPREHENSIVE METABOLIC PANEL - Abnormal; Notable for the following components:      Result Value   Sodium 132 (*)    Chloride 93 (*)    Glucose, Bld 109 (*)    All other components within normal limits  CBG MONITORING, ED - Abnormal; Notable for the following components:   Glucose-Capillary 131 (*)    All other components within normal limits  SARS CORONAVIRUS 2 BY RT PCR (HOSPITAL ORDER, [facility])  CBC WITH DIFFERENTIAL/PLATELET  LIPASE, BLOOD  HEMOGLOBIN A1C  MAGNESIUM  PHOSPHORUS  CBC WITH DIFFERENTIAL/PLATELET  TSH  COMPREHENSIVE METABOLIC PANEL  TROPONIN I (HIGH SENSITIVITY)  TROPONIN I (HIGH SENSITIVITY)   Radiology CT Abdomen Pelvis W Contrast  Result Date: 09/07/2019 CLINICAL DATA:  Severe abdominal pain. EXAM: CT ABDOMEN AND PELVIS WITH CONTRAST TECHNIQUE: Multidetector CT imaging of the abdomen and pelvis was performed using the standard protocol following bolus administration of intravenous contrast. CONTRAST:  116mL OMNIPAQUE IOHEXOL 300 MG/ML  SOLN COMPARISON:  10/09/2011. FINDINGS: Lower chest: There is a stable pulmonary nodule in the left lower lobe.The heart size  is normal. Hepatobiliary: The liver is normal. Normal gallbladder.There is no biliary ductal dilation. Pancreas: Normal contours without ductal dilatation. No peripancreatic fluid collection. Spleen: Unremarkable. Adrenals/Urinary Tract: --Adrenal glands: Unremarkable. --Right kidney/ureter: No hydronephrosis or radiopaque kidney stones. --Left kidney/ureter: No hydronephrosis or radiopaque kidney stones. --Urinary bladder: Unremarkable. Stomach/Bowel: --Stomach/Duodenum: No hiatal hernia or other gastric abnormality. Normal duodenal course and caliber. --Small bowel: Unremarkable. --Colon: There is a large amount of stool throughout the colon. There are findings concerning for uncomplicated sigmoid diverticulitis. There is a focal narrowing of the distal descending colon/proximal sigmoid colon. --Appendix: Normal. Vascular/Lymphatic: Atherosclerotic calcification is present within the non-aneurysmal abdominal aorta, without hemodynamically significant stenosis. 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 --No retroperitoneal lymphadenopathy. --No mesenteric lymphadenopathy. --No pelvic or inguinal lymphadenopathy. Reproductive: Unremarkable Other: No ascites or free air. There are bilateral fat containing inguinal hernias, right greater than left. Musculoskeletal. No acute displaced fractures. IMPRESSION: 1. Findings concerning for uncomplicated sigmoid diverticulitis. 2. Large amount of stool throughout the colon. 3. There is a focal narrowing of the distal descending colon/proximal sigmoid colon. Correlation with outpatient colonoscopy is recommended. 4. 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 5. Bilateral fat containing inguinal hernias, right greater than left. Aortic Atherosclerosis (ICD10-I70.0). Electronically Signed   By: Constance Holster M.D.   On: 09/07/2019 18:56   DG CHEST PORT 1 VIEW  Result Date: 09/07/2019 CLINICAL DATA:  Chills, chest pain, previous tobacco  abuse, severe abdominal pain EXAM: PORTABLE CHEST 1 VIEW COMPARISON:  07/30/2019 FINDINGS: Two frontal views of the chest are obtained. Cardiac silhouette is unremarkable. No airspace disease, effusion, or pneumothorax. No acute bony abnormalities. IMPRESSION: 1. No acute intrathoracic process. Electronically Signed   By: Randa Ngo M.D.   On: 09/07/2019 21:05    Procedures Procedures (including critical care time)  Medications Ordered in ED Medications  amLODipine (  NORVASC) tablet 10 mg (10 mg Oral Given 09/07/19 1849)  carvedilol (COREG) tablet 12.5 mg (12.5 mg Oral Given 09/07/19 1849)  morphine 2 MG/ML injection 2 mg (has no administration in time range)  insulin glargine (LANTUS) injection 20 Units (has no administration in time range)  terazosin (HYTRIN) capsule 5 mg (has no administration in time range)  thiamine tablet 100 mg (100 mg Oral Given 09/07/19 2110)  sodium chloride flush (NS) 0.9 % injection 3 mL (3 mLs Intravenous Not Given 09/07/19 2050)  acetaminophen (TYLENOL) tablet 650 mg (has no administration in time range)    Or  acetaminophen (TYLENOL) suppository 650 mg (has no administration in time range)  HYDROcodone-acetaminophen (NORCO/VICODIN) 5-325 MG per tablet 1-2 tablet (has no administration in time range)  docusate sodium (COLACE) capsule 100 mg (100 mg Oral Given 09/07/19 2110)  ondansetron (ZOFRAN) tablet 4 mg (has no administration in time range)    Or  ondansetron (ZOFRAN) injection 4 mg (has no administration in time range)  insulin aspart (novoLOG) injection 0-9 Units (1 Units Subcutaneous Given 09/07/19 2108)  enoxaparin (LOVENOX) injection 40 mg (40 mg Subcutaneous Given 09/07/19 2109)  0.9 %  sodium chloride infusion ( Intravenous New Bag/Given 09/07/19 2114)  senna (SENOKOT) tablet 8.6 mg (8.6 mg Oral Given 09/07/19 2110)  polyethylene glycol (MIRALAX / GLYCOLAX) packet 17 g (has no administration in time range)  bisacodyl (DULCOLAX) EC tablet 5 mg (has no  administration in time range)  piperacillin-tazobactam (ZOSYN) IVPB 3.375 g (has no administration in time range)  morphine 4 MG/ML injection 4 mg (4 mg Intravenous Given 09/07/19 1821)  sodium chloride 0.9 % bolus 500 mL (0 mLs Intravenous Stopped 09/07/19 2051)  iohexol (OMNIPAQUE) 300 MG/ML solution 100 mL (100 mLs Intravenous Contrast Given 09/07/19 1831)  ciprofloxacin (CIPRO) IVPB 400 mg (0 mg Intravenous Stopped 09/07/19 2115)  metroNIDAZOLE (FLAGYL) IVPB 500 mg (0 mg Intravenous Stopped 09/07/19 2115)  morphine 4 MG/ML injection 4 mg (4 mg Intravenous Given 09/07/19 1957)    ED Course  I have reviewed the triage vital signs and the nursing notes.  Pertinent labs & imaging results that were available during my care of the patient were reviewed by me and considered in my medical decision making (see chart for details).  On repeat exam the patient is awake, alert, continues to complain of pain in his left lower quadrant. CT discussed, reviewed, findings concerning for diverticulitis, and given his ongoing outpatient antibiotics, L may be too early to definitively state he has a failed outpatient therapy, he is persistent.  For pain management, antiemetics indicates admission. Have discussed case with our internal medicine colleagues after discussing results with him at length.  MDM Number of Diagnoses or Management Options Acute diverticulitis: new, needed workup   Amount and/or Complexity of Data Reviewed Clinical lab tests: reviewed Tests in the radiology section of CPT: reviewed Tests in the medicine section of CPT: reviewed Discussion of test results with the performing providers: yes Decide to obtain previous medical records or to obtain history from someone other than the patient: yes Review and summarize past medical records: yes Discuss the patient with other providers: yes Independent visualization of images, tracings, or specimens: yes  Risk of Complications, Morbidity,  and/or Mortality Presenting problems: high Diagnostic procedures: high Management options: high  Critical Care Total time providing critical care: < 30 minutes  Patient Progress Patient progress: stable   Final Clinical Impression(s) / ED Diagnoses Final diagnoses:  Acute diverticulitis  Carmin Muskrat, MD 09/07/19 2246

## 2019-09-07 NOTE — ED Notes (Signed)
Attempted to call report; floor is unable to take report yet

## 2019-09-07 NOTE — H&P (Addendum)
[name]. JKD:[id number] DOB: [birth year] DOA: 09/07/2019     [facility]   Outpatient Specialists:   cardiology at [place]   Sadie Haber )    Patient arrived to ER on 09/07/19 at 1223 Referred by Attending Carmin Muskrat, MD   Patient coming from: home Lives   With family   Chief Complaint:  Chief Complaint  Patient presents with  . Abdominal Pain    HPI: [name]. is a 69 y.o. male with medical history significant of diabetes type 2, hypertension, right carotid artery stenosis, history of subdural hematoma, history of DVT in 2006, sleep apnea supposed to be on CPAP    Presented with   severe abdominal pain for the past few weeks he was seen at [facility] and was diagnosed with diverticulisis he was treated with Cipro and Flagyl but continued to have abdominal pain chills and felt like he was having a fever.  Pain was severe 9 out of 10 which brought him into emergency department patient denies any nausea vomiting, he reported some chest pain  shortness of breath but it was short episode. He drinks about 10 cans of beers per week.  Patient has been admitted for accelerated hypertension in June 2021 was treated with IV labetalol at a time  Reports when he took Cipro he felt some lip burning he has taken it in the past with no issues, he had Cipro in ER and tolerated it well     Pt reports he went to Cardiology at [facility] recently and had an echo done No records in [place] left leg tenderness same as when he had a DVT  Infectious risk factors:  Reports  fever, shortness of breath , chest pain,     Has  been vaccinated against COVID    Initial COVID TEST   in house  PCR testing  Pending  Lab Results  Component Value Date   [place] 07/31/2019     Regarding pertinent Chronic problems:    Hyperlipidemia -  Not on statins     HTN on Norvasc ( he stopped due to edema but had to restart) Coreg  hydrochlorothiazide    DM 2 -  Lab Results  Component Value Date   HGBA1C 7.6 (H) 05/07/2015   on insulin,          OSA -on nocturnal  CPAP,      While in ER:  CT abdomen was done showed sigmoid diverticulitis and significant constipation is also a focal narrowing of distal descending colon probably will need outpatient colonoscopy    Hospitalist was called for admission for diverticulitis failure of out pt treatment  The following Work up has been ordered so far:  Orders Placed This Encounter  Procedures  . SARS Coronavirus 2 by RT PCR (hospital order, performed in [facility] hospital lab) Nasopharyngeal Nasopharyngeal Swab  . CT Abdomen Pelvis W Contrast  . CBC with Differential  . Comprehensive metabolic panel  . Lipase, blood  . Consult to hospitalist  ALL PATIENTS BEING ADMITTED/HAVING PROCEDURES NEED COVID-19 SCREENING     Following Medications were ordered in ER: Medications  amLODipine (NORVASC) tablet 10 mg (10 mg Oral Given 09/07/19 1849)  carvedilol (COREG) tablet 12.5 mg (12.5 mg Oral Given 09/07/19 1849)  ciprofloxacin (CIPRO) IVPB 400 mg (has no administration in time range)  metroNIDAZOLE (FLAGYL) IVPB 500 mg (has no administration in time range)  morphine 4 MG/ML injection 4 mg (has  no administration in time range)  morphine 4 MG/ML injection 4 mg (4 mg Intravenous Given 09/07/19 1821)  sodium chloride 0.9 % bolus 500 mL (500 mLs Intravenous New Bag/Given 09/07/19 1821)  iohexol (OMNIPAQUE) 300 MG/ML solution 100 mL (100 mLs Intravenous Contrast Given 09/07/19 1831)        Consult Orders  (From admission, onward)         Start     Ordered   09/07/19 1933  Consult to hospitalist  ALL PATIENTS BEING ADMITTED/HAVING PROCEDURES NEED COVID-19 SCREENING  Once       Comments: ALL PATIENTS BEING ADMITTED/HAVING PROCEDURES NEED COVID-19 SCREENING  Provider:  (Not yet assigned)  Question Answer Comment  Place call to: [facility]   Reason for Consult  Admit      09/07/19 1932          Significant initial  Findings: Abnormal Labs Reviewed  COMPREHENSIVE METABOLIC PANEL - Abnormal; Notable for the following components:      Result Value   Sodium 132 (*)    Chloride 93 (*)    Glucose, Bld 109 (*)    All other components within normal limits    Otherwise labs showing:    Recent Labs  Lab 09/07/19 1512  NA 132*  K 3.8  CO2 27  GLUCOSE 109*  BUN 14  CREATININE 0.87  CALCIUM 9.4    Cr   stable,   Lab Results  Component Value Date   CREATININE 0.87 09/07/2019   CREATININE 0.75 08/01/2019   CREATININE 0.65 07/31/2019    Recent Labs  Lab 09/07/19 1512  AST 39  ALT 43  ALKPHOS 66  BILITOT 1.0  PROT 8.0  ALBUMIN 4.4   Lab Results  Component Value Date   CALCIUM 9.4 09/07/2019     WBC      Component Value Date/Time   WBC 9.0 09/07/2019 1512   ANC    Component Value Date/Time   NEUTROABS 6.2 09/07/2019 1512    Plt: Lab Results  Component Value Date   PLT 290 09/07/2019     COVID-19 Labs    HG/HCT  stable,       Component Value Date/Time   HGB 15.3 09/07/2019 1512   HCT 45.6 09/07/2019 1512    Recent Labs  Lab 09/07/19 1512  LIPASE 21   No results for input(s): AMMONIA in the last 168 hours.    Troponin  ordered      ECG: Ordered Personally reviewed by me showing: HR  71 Rhythm:  NSR,  RBBB, LFB  no evidence of ischemic changes QTC 464      UA  not ordered   Urine analysis:    Component Value Date/Time   COLORURINE YELLOW 12/21/2012 [place] 12/21/2012 1341   LABSPEC 1.017 12/21/2012 1341   PHURINE 6.5 12/21/2012 1341   GLUCOSEU NEGATIVE 12/21/2012 1341   HGBUR NEGATIVE 12/21/2012 1341   BILIRUBINUR NEGATIVE 12/21/2012 1341   KETONESUR NEGATIVE 12/21/2012 1341   PROTEINUR NEGATIVE 12/21/2012 1341   UROBILINOGEN 1.0 12/21/2012 1341   NITRITE NEGATIVE 12/21/2012 1341   LEUKOCYTESUR NEGATIVE 12/21/2012 1341      Ordered   CXR - NON  acute  CTabd/pelvis - diverticulitis, colon narrowing     ED Triage Vitals  Enc Vitals Group     BP 09/07/19 1250 (!) 188/71     Pulse Rate 09/07/19 1250 61     Resp 09/07/19 1250 18     Temp 09/07/19  1250 98 F (36.7 C)     Temp Source 09/07/19 1250 Oral     SpO2 09/07/19 1250 97 %     Weight 09/07/19 1301 267 lb (121.1 kg)     Height 09/07/19 1301 6\' 2"  (1.88 m)     Head Circumference --      Peak Flow --      Pain Score 09/07/19 1300 9     Pain Loc --      Pain Edu? --      Excl. in [place]? --   TMAX(24)@       Latest  Blood pressure (!) 187/83, pulse 70, temperature 98 F (36.7 C), temperature source Oral, resp. rate 18, height 6\' 2"  (1.88 m), weight 121.1 kg, SpO2 99 %.      Review of Systems:    Pertinent positives include:  chills, fatigue, abdominal pain,  shortness of breath at rest.   Constitutional:  No weight loss, night sweats, Fevers,  weight loss  HEENT:  No headaches, Difficulty swallowing,Tooth/dental problems,Sore throat,  No sneezing, itching, ear ache, nasal congestion, post nasal drip,  Cardio-vascular:  No chest pain, Orthopnea, PND, anasarca, dizziness, palpitations.no Bilateral lower extremity swelling  GI:  No heartburn, indigestion,  nausea, vomiting, diarrhea, change in bowel habits, loss of appetite, melena, blood in stool, hematemesis Resp:  noNo dyspnea on exertion, No excess mucus, no productive cough, No non-productive cough, No coughing up of blood.No change in color of mucus. No wheezing. Skin:  no rash or lesions. No jaundice GU:  no dysuria, change in color of urine, no urgency or frequency. No straining to urinate.  No flank pain.  Musculoskeletal:  No joint pain or no joint swelling. No decreased range of motion. No back pain.  Psych:  No change in mood or affect. No depression or anxiety. No memory loss.  Neuro: no localizing neurological complaints, no tingling, no weakness, no double vision, no gait abnormality, no slurred  speech, no confusion  All systems reviewed and apart from [place] all are negative  Past Medical History:   Past Medical History:  Diagnosis Date  . Cataract   . Clotting disorder ([place])    DVT in 2006 x1; no problems since  . Diabetes mellitus   . Diverticulitis   . Hypertension   . Peripheral vascular disease ([place]) 2006   dvt 's  . Pneumonia 06  . Sleep apnea    cpap started 2 months ago set on 4  . Subdural hematoma [facility])      Past Surgical History:  Procedure Laterality Date  . BRAIN SURGERY  2010   subdural hematoma -bleed  . DG THUMB LEFT HAND Left    "gamers "  . EYE SURGERY Right 12/16   cataract  . ROTATOR CUFF REPAIR W/ DISTAL CLAVICLE EXCISION Left 05/20/2015  . SHOULDER ARTHROSCOPY WITH SUBACROMIAL DECOMPRESSION, ROTATOR CUFF REPAIR AND BICEP TENDON REPAIR Left 05/20/2015   Procedure: LEFT SHOULDER ARTHROSCOPY WITH SUBACROMIAL DECOMPRESSION,DISTAL CLAVICAL RESECTION  ROTATOR CUFF REPAIR ;  Surgeon: Justice Britain, MD;  Location: [place];  Service: Orthopedics;  Laterality: Left;  . TONSILLECTOMY      Social History:  Ambulatory  independently       reports that he quit smoking about 38 years ago. His smoking use included cigarettes. He has a 15.00 pack-year smoking history. He has never used smokeless tobacco. He reports current alcohol use of about 10.0 standard drinks of alcohol per week. He reports that he does not use  drugs.   Family History:   Family History  Problem Relation Age of Onset  . Stroke Father   . Diabetes Brother     Allergies: Allergies  Allergen Reactions  . Ace Inhibitors     Angioedema per pt report to admiting MD On 07/31/2019   . Peanuts [Peanut Oil] Shortness Of Breath    Swelling throat, sob   . Tamiflu [Oseltamivir Phosphate] Shortness Of Breath  . Gabapentin Swelling  . Lyrica [Pregabalin] Swelling  . Statins Other (See Comments)    Leg problems.  . Avelox [Moxifloxacin Hcl In Nacl] Rash     Prior to Admission medications    Medication Sig Start Date End Date Taking? Authorizing Provider  amLODipine (NORVASC) 10 MG tablet Take 1 tablet (10 mg total) by mouth daily. 08/02/19   Aline August, MD  aspirin 81 MG chewable tablet Chew 81 mg by mouth daily.    [provider]  carvedilol (COREG) 12.5 MG tablet Take 1 tablet (12.5 mg total) by mouth 2 (two) times daily with a meal. 08/01/19   Aline August, MD  Cholecalciferol (DIALYVITE VITAMIN D 5000 PO) Take 5,000 Units by mouth every Monday, Wednesday, and Friday. Takes at night    [provider]  ECHINACEA PO Take 1 tablet by mouth daily.    [provider]  fluticasone (FLONASE) 50 MCG/ACT nasal spray Place 1 spray into both nostrils in the morning and at bedtime.    [provider]  folic acid (FOLVITE) 1 MG tablet Take 1 tablet (1 mg total) by mouth daily. 08/02/19   Aline August, MD  hydrochlorothiazide (HYDRODIURIL) 25 MG tablet Take 25 mg by mouth every morning. 11/28/17   [provider]  insulin aspart (NOVOLOG) 100 UNIT/ML injection Inject 8 Units into the skin 3 (three) times daily with meals.     [provider]  insulin glargine (LANTUS) 100 UNIT/ML injection Inject 20 Units into the skin 2 (two) times daily.     [provider]  LACTOBACILLUS BIFIDUS PO Take 1 tablet by mouth daily.     [provider]  magnesium oxide (MAG-OX) 400 MG tablet Take 400 mg by mouth at bedtime.    [provider]  Multiple Vitamin (MULTIVITAMIN WITH MINERALS) TABS Take 1 tablet by mouth every evening.     [provider]  Naphazoline-Pheniramine (OPCON-A) 0.027-0.315 % SOLN Apply 1 drop to eye daily as needed (dry eyes).    [provider]  Omega-3 Fatty Acids (FISH OIL PO) Take 1 capsule by mouth every evening.     [provider]  OVER THE COUNTER MEDICATION Take 1 capsule by mouth at bedtime. Oil of [place]    [provider]  potassium gluconate 595 MG TABS Take  595 mg by mouth every evening.     [provider]  terazosin (HYTRIN) 5 MG capsule Take 5 mg by mouth 2 (two) times daily.     [provider]  thiamine 100 MG tablet Take 1 tablet (100 mg total) by mouth daily. 08/02/19   Aline August, MD  TURMERIC PO Take 1 tablet by mouth every evening.     [provider]   Physical Exam: Vitals with BMI 09/07/2019 09/07/2019 09/07/2019  Height - - -  Weight - - -  BMI - - -  Systolic 749 449 675  Diastolic 83 78 78  Pulse - 70 70    1. General:  in   Acute distress pain  Chronically ill -appearing 2. Psychological: Alert and  Oriented 3. Head/ENT:     Dry Mucous Membranes                          Head Non traumatic, neck supple                        Poor Dentition 4. SKIN:    decreased Skin turgor,  Skin clean Dry and intact no rash 5. Heart: Regular rate and rhythm no  Murmur, no Rub or gallop 6. Lungs:  no wheezes or crackles   7. Abdomen: Soft,  tender,   Distended obese bowel sounds present 8. Lower extremities: no clubbing, cyanosis, no edema, left leg tenderness 9. Neurologically Grossly intact, moving all 4 extremities equally  10. MSK: Normal range of motion   All other LABS:     Recent Labs  Lab 09/07/19 1512  WBC 9.0  NEUTROABS 6.2  HGB 15.3  HCT 45.6  MCV 87.5  PLT 290     Recent Labs  Lab 09/07/19 1512  NA 132*  K 3.8  CL 93*  CO2 27  GLUCOSE 109*  BUN 14  CREATININE 0.87  CALCIUM 9.4     Recent Labs  Lab 09/07/19 1512  AST 39  ALT 43  ALKPHOS 66  BILITOT 1.0  PROT 8.0  ALBUMIN 4.4       Cultures:    Component Value Date/Time   SDES URINE, CLEAN CATCH 07/24/2008 2109   SPECREQUEST NONE 07/24/2008 2109   CULT NO GROWTH 07/24/2008 2109   REPTSTATUS 07/26/2008 FINAL 07/24/2008 2109     Radiological Exams on Admission: CT Abdomen Pelvis W Contrast  Result Date: 09/07/2019 CLINICAL DATA:  Severe abdominal pain. EXAM: CT ABDOMEN AND PELVIS WITH CONTRAST TECHNIQUE:  Multidetector CT imaging of the abdomen and pelvis was performed using the standard protocol following bolus administration of intravenous contrast. CONTRAST:  163mL OMNIPAQUE IOHEXOL 300 MG/ML  SOLN COMPARISON:  10/09/2011. FINDINGS: Lower chest: There is a stable pulmonary nodule in the left lower lobe.The heart size is normal. Hepatobiliary: The liver is normal. Normal gallbladder.There is no biliary ductal dilation. Pancreas: Normal contours without ductal dilatation. No peripancreatic fluid collection. Spleen: Unremarkable. Adrenals/Urinary Tract: --Adrenal glands: Unremarkable. --Right kidney/ureter: No hydronephrosis or radiopaque kidney stones. --Left kidney/ureter: No hydronephrosis or radiopaque kidney stones. --Urinary bladder: Unremarkable. Stomach/Bowel: --Stomach/Duodenum: No hiatal hernia or other gastric abnormality. Normal duodenal course and caliber. --Small bowel: Unremarkable. --Colon: There is a large amount of stool throughout the colon. There are findings concerning for uncomplicated sigmoid diverticulitis. There is a focal narrowing of the distal descending colon/proximal sigmoid colon. --Appendix: Normal. Vascular/Lymphatic: Atherosclerotic calcification is present within the non-aneurysmal abdominal aorta, without hemodynamically significant stenosis. 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 --No retroperitoneal lymphadenopathy. --No mesenteric lymphadenopathy. --No pelvic or inguinal lymphadenopathy. Reproductive: Unremarkable Other: No ascites or free air. There are bilateral fat containing inguinal hernias, right greater than left. Musculoskeletal. No acute displaced fractures. IMPRESSION: 1. Findings concerning for uncomplicated sigmoid diverticulitis. 2. Large amount of stool throughout the colon. 3. There is a focal narrowing of the distal descending colon/proximal sigmoid colon. Correlation with outpatient colonoscopy is recommended. 4. 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 5. Bilateral fat containing inguinal hernias, right greater than left. Aortic Atherosclerosis (ICD10-I70.0). Electronically Signed   By: Constance Holster M.D.   On: 09/07/2019 18:56  Chart has been reviewed   Assessment/Plan  69 y.o. male with medical history significant of diabetes type 2, hypertension, right carotid artery stenosis, history of subdural hematoma, history of DVT in 2006, sleep apnea supposed to be on CPAP     Admitted for diverticulitis  Present on Admission: . Diverticulitis -patient thinks he may have had allergic reaction to Cipro at home he only mentioned that to me after discussion apparently he did not mention that to emergency department at that time he already received Cipro IV patient tolerated well without any allergic reaction.  For safety reasons we will switch to Zosyn.  Given that patient tolerated Cipro very well and have had Cipro in the past multiple times doubt that he is truly allergic to ciprofloxacin.  . Essential hypertension, benign -continue home medications and monitor blood pressure difficult to control.  Patient has swelling induced secondary to Norvasc but unable to come off secondary to severe hypertension  . Sleep apnea -continue CPAP  . Hyponatremia most likely in the setting of dehydration will order urine electrolytes and gently rehydrate hold fluid status   . Dehydration -rehydrate and fallen  . Constipation -bowel regimen  . Chest pain -cycle cardiac enzymes currently resolved no EKG changes suggestive of acute ischemia.  Continue to monitor on telemetry. Patient states he had recent echogram I am unable to obtain those records at this time.  Would repeat echo if unable to get Left leg tenderness.  History of DVT in the past.  Will obtain Dopplers.  Does not appear to be swollen.  Colonic narrowing. -Discussed with patient importance of having this father checked out as an outpatient with colonoscopy  patient states he understands and will schedule an appointment if his PCP  DM 2 -  - Order Sensitive  SSI   - continue home insulin regimen   -  check TSH and HgA1C  - Hold by mouth medications    Other plan as per orders.  DVT prophylaxis:   Lovenox       Code Status:    Code Status: Prior FULL CODE  as per patient  I had personally discussed CODE STATUS with patient     Family Communication:   Family not at  Bedside    Disposition Plan:                              To home once workup is complete and patient is stable   Following barriers for discharge:                            Electrolytes corrected                                                           Pain controlled with PO medications                               Afebrile, white count improving able to transition to PO antibiotics                             Will need to be  able to tolerate PO                               Consults called: none  Admission status:  ED Disposition    None        inpatient     I Expect 2 midnight stay secondary to severity of patient's current illness need for inpatient interventions justified by the following:  hemodynamic instability despite optimal treatment     Severe lab/radiological/exam abnormalities including:  diverticulitis   and extensive comorbidities including:  DM2    Morbid Obesity    That are currently affecting medical management.   I expect  patient to be hospitalized for 2 midnights requiring inpatient medical care.  Patient is at high risk for adverse outcome (such as loss of life or disability) if not treated.  Indication for inpatient stay as follows:     severe pain requiring acute inpatient management,  inability to maintain oral hydration     Need for IV antibiotics, IV fluids,      Level of care    tele  For 24H        Lab Results  Component Value Date   [place] 07/31/2019     Precautions: admitted as asymptomatic  screening protocol    PPE: Used by the provider:   [id number]   eye Goggles,  Gloves    Sophia Sperry 09/07/2019, 9:21 PM    [facility]     after 2 AM please page floor coverage PA If 7AM-7PM, please contact the day team taking care of the patient using [email or web address]   Patient was evaluated in the context of the global COVID-19 pandemic, which necessitated consideration that the patient might be at risk for infection with the SARS-CoV-2 virus that causes COVID-19. Institutional protocols and algorithms that pertain to the evaluation of patients at risk for COVID-19 are in a state of rapid change based on information released by regulatory bodies including the CDC and federal and state organizations. These policies and algorithms were followed during the patient's care.

## 2019-09-07 NOTE — ED Triage Notes (Signed)
Patient reports severe abdominal pain starting a few weeks ago. Says he went to [facility] and was diagnosed with diverticulosis and given cipro and flagyl. Pain says for past few days he has felt chills and inside of his body shakes. Pain rated 9/10

## 2019-09-08 ENCOUNTER — Inpatient Hospital Stay (HOSPITAL_COMMUNITY): Payer: No Typology Code available for payment source

## 2019-09-08 ENCOUNTER — Encounter (HOSPITAL_COMMUNITY): Payer: Self-pay | Admitting: Internal Medicine

## 2019-09-08 DIAGNOSIS — I10 Essential (primary) hypertension: Secondary | ICD-10-CM

## 2019-09-08 DIAGNOSIS — I350 Nonrheumatic aortic (valve) stenosis: Secondary | ICD-10-CM

## 2019-09-08 DIAGNOSIS — M79609 Pain in unspecified limb: Secondary | ICD-10-CM

## 2019-09-08 DIAGNOSIS — G4730 Sleep apnea, unspecified: Secondary | ICD-10-CM

## 2019-09-08 DIAGNOSIS — E860 Dehydration: Secondary | ICD-10-CM

## 2019-09-08 DIAGNOSIS — Z794 Long term (current) use of insulin: Secondary | ICD-10-CM

## 2019-09-08 DIAGNOSIS — E871 Hypo-osmolality and hyponatremia: Secondary | ICD-10-CM

## 2019-09-08 DIAGNOSIS — E1165 Type 2 diabetes mellitus with hyperglycemia: Secondary | ICD-10-CM

## 2019-09-08 DIAGNOSIS — R935 Abnormal findings on diagnostic imaging of other abdominal regions, including retroperitoneum: Secondary | ICD-10-CM

## 2019-09-08 DIAGNOSIS — K5900 Constipation, unspecified: Secondary | ICD-10-CM

## 2019-09-08 LAB — GLUCOSE, CAPILLARY
Glucose-Capillary: 101 mg/dL — ABNORMAL HIGH (ref 70–99)
Glucose-Capillary: 111 mg/dL — ABNORMAL HIGH (ref 70–99)
Glucose-Capillary: 116 mg/dL — ABNORMAL HIGH (ref 70–99)
Glucose-Capillary: 164 mg/dL — ABNORMAL HIGH (ref 70–99)
Glucose-Capillary: 73 mg/dL (ref 70–99)
Glucose-Capillary: 96 mg/dL (ref 70–99)

## 2019-09-08 LAB — CBC WITH DIFFERENTIAL/PLATELET
Abs Immature Granulocytes: 0.03 10*3/uL (ref 0.00–0.07)
Basophils Absolute: 0.1 10*3/uL (ref 0.0–0.1)
Basophils Relative: 1 %
Eosinophils Absolute: 0.2 10*3/uL (ref 0.0–0.5)
Eosinophils Relative: 2 %
HCT: 44.7 % (ref 39.0–52.0)
Hemoglobin: 14.8 g/dL (ref 13.0–17.0)
Immature Granulocytes: 0 %
Lymphocytes Relative: 18 %
Lymphs Abs: 1.5 10*3/uL (ref 0.7–4.0)
MCH: 29.1 pg (ref 26.0–34.0)
MCHC: 33.1 g/dL (ref 30.0–36.0)
MCV: 87.8 fL (ref 80.0–100.0)
Monocytes Absolute: 0.9 10*3/uL (ref 0.1–1.0)
Monocytes Relative: 11 %
Neutro Abs: 5.8 10*3/uL (ref 1.7–7.7)
Neutrophils Relative %: 68 %
Platelets: 275 10*3/uL (ref 150–400)
RBC: 5.09 MIL/uL (ref 4.22–5.81)
RDW: 13.5 % (ref 11.5–15.5)
WBC: 8.5 10*3/uL (ref 4.0–10.5)
nRBC: 0 % (ref 0.0–0.2)

## 2019-09-08 LAB — ECHOCARDIOGRAM COMPLETE
Height: 74 in
Weight: 4272 oz

## 2019-09-08 LAB — MAGNESIUM: Magnesium: 2.1 mg/dL (ref 1.7–2.4)

## 2019-09-08 LAB — COMPREHENSIVE METABOLIC PANEL
ALT: 48 U/L — ABNORMAL HIGH (ref 0–44)
AST: 43 U/L — ABNORMAL HIGH (ref 15–41)
Albumin: 4 g/dL (ref 3.5–5.0)
Alkaline Phosphatase: 63 U/L (ref 38–126)
Anion gap: 9 (ref 5–15)
BUN: 11 mg/dL (ref 8–23)
CO2: 28 mmol/L (ref 22–32)
Calcium: 9 mg/dL (ref 8.9–10.3)
Chloride: 99 mmol/L (ref 98–111)
Creatinine, Ser: 0.79 mg/dL (ref 0.61–1.24)
GFR calc Af Amer: >60 mL/min (ref >60)
GFR calc non Af Amer: >60 mL/min (ref >60)
Glucose, Bld: 102 mg/dL — ABNORMAL HIGH (ref 70–99)
Potassium: 3.7 mmol/L (ref 3.5–5.1)
Sodium: 136 mmol/L (ref 135–145)
Total Bilirubin: 0.9 mg/dL (ref 0.3–1.2)
Total Protein: 7.3 g/dL (ref 6.5–8.1)

## 2019-09-08 LAB — TSH: TSH: 2.743 u[IU]/mL (ref 0.350–4.500)

## 2019-09-08 LAB — HEMOGLOBIN A1C
Hgb A1c MFr Bld: 6.7 % — ABNORMAL HIGH (ref 4.8–5.6)
Mean Plasma Glucose: 145.59 mg/dL

## 2019-09-08 LAB — PHOSPHORUS: Phosphorus: 3.7 mg/dL (ref 2.5–4.6)

## 2019-09-08 MED ORDER — LACTATED RINGERS IV SOLN: INTRAVENOUS | Status: DC

## 2019-09-08 MED ORDER — SENNA 8.6 MG PO TABS
2.0000 | ORAL_TABLET | Freq: Two times a day (BID) | ORAL | Status: DC
  Administered 2019-09-08 – 2019-09-09 (×3): 17.2 mg via ORAL
  Filled 2019-09-08 (×5): qty 2

## 2019-09-08 MED ORDER — POLYETHYLENE GLYCOL 3350 17 G PO PACK
17.0000 g | PACK | Freq: Two times a day (BID) | ORAL | Status: DC
  Administered 2019-09-08 – 2019-09-09 (×3): 17 g via ORAL
  Filled 2019-09-08 (×5): qty 1

## 2019-09-08 MED ORDER — DIPHENHYDRAMINE HCL 50 MG/ML IJ SOLN
12.5000 mg | Freq: Once | INTRAMUSCULAR | Status: AC
  Administered 2019-09-08: 12.5 mg via INTRAVENOUS
  Filled 2019-09-08: qty 1

## 2019-09-08 MED ORDER — ENOXAPARIN SODIUM 60 MG/0.6ML ~~LOC~~ SOLN
60.0000 mg | SUBCUTANEOUS | Status: DC
  Administered 2019-09-08 – 2019-09-09 (×2): 60 mg via SUBCUTANEOUS
  Filled 2019-09-08 (×2): qty 0.6

## 2019-09-08 MED ORDER — DIPHENHYDRAMINE HCL 50 MG/ML IJ SOLN
25.0000 mg | Freq: Once | INTRAMUSCULAR | Status: AC
  Administered 2019-09-08: 25 mg via INTRAVENOUS
  Filled 2019-09-08: qty 1

## 2019-09-08 MED ORDER — DIPHENHYDRAMINE HCL 50 MG PO CAPS
50.0000 mg | ORAL_CAPSULE | Freq: Four times a day (QID) | ORAL | Status: DC | PRN
  Administered 2019-09-08 – 2019-09-10 (×5): 50 mg via ORAL
  Filled 2019-09-08 (×7): qty 1

## 2019-09-08 NOTE — Progress Notes (Signed)
Patient called this nurse at about 0700 hrs about feeling of tingling sensation around his mouth and tongue, he also stated he is having some twitching to his eyes and that he had same issue last month when he was on Cipro, and he took benadryl which was effective. Zosyn put on hold and Doctor on call notified and order given for benadryl which was administer to patient. ABT Zosyn restarted after he received the benadryl will pass on to oncoming nurse to monitor patient.

## 2019-09-08 NOTE — Progress Notes (Signed)
PROGRESS NOTE  [name].  RXV:[id number] DOB: Aug 18, 1950 DOA: 09/07/2019 [facility]CP[facility]   Brief Narrative: [name]. is a 69 y.o. male with a history of T2DM, HTN, right carotid stenosis, SDH, DVT, OSA, and diverticulitis who presented to the ED with LLQ abdominal pain, subjective fevers despite taking cipro and flagyl as an outpatient.    Assessment & Plan: Active Problems:   Essential hypertension, benign   Type 2 diabetes mellitus with hyperglycemia, with long-term current use of insulin (HCC)   Chest pain   Sleep apnea   Hyponatremia   Diverticulitis   Constipated   Dehydration   Constipation   Abnormal CT of the abdomen  Acute diverticulitis: Recurrent, moderately severe based on pain and CT scan. No leukocytosis.  - Continue clear liquid diet. If continues requiring IV narcotics would go to NPO. No peritoneal signs.  - Continue zosyn.   Constipation: Needs improved control acutely and chronically - Bowel regimen augmented  Multiple drug intolerances:  - Benadryl prn, monitor for true anaphylaxis  T2DM:  - SSI  Left leg tenderness, history of DVT:  - Negative venous U/S  OSA:  - CPAP  HTN:  - Continue medications as ordered.   Hyponatremia:  - Monitor with IVF  Colonic narrowing: Discussed with patient, suspect this could be stricture related to recurrent bouts of diverticulitis. NEEDS to have colonoscopy weeks after discharge.   Obesity: Estimated body mass index is 34.28 kg/m as calculated from the following:   Height as of this encounter: 6\' 2"  (1.88 m).   Weight as of this encounter: 121.1 kg.  DVT prophylaxis: Lovenox Code Status: Full Family Communication: None at bedside Disposition Plan:  Status is: Inpatient  Remains inpatient appropriate because:Ongoing active pain requiring inpatient pain management   Dispo: The patient is from: Home              Anticipated d/c is to: Home              Anticipated d/c  date is: 2 days              Patient currently is not medically stable to d/c.  Consultants:   None  Procedures:   None  Antimicrobials:  Zosyn   Subjective: LLQ abdominal pain is severe, improved with morphine but not for too long, not necessarily worse with food, tolerating clear liquids.   Objective: Vitals:   09/08/19 0416 09/08/19 0723 09/08/19 1211 09/08/19 1858  BP: 105/70 (!) 162/68 (!) 144/68 (!) 154/65  Pulse: 67 60 (!) 59 62  Resp:  16 16   Temp: 98.1 F (36.7 C)  (!) 97.3 F (36.3 C)   TempSrc: Oral  Oral   SpO2: 98%  99%   Weight:      Height:        Intake/Output Summary (Last 24 hours) at 09/08/2019 1928 Last data filed at 09/08/2019 1730 Gross per 24 hour  Intake 562.63 ml  Output --  Net 562.63 ml   Filed Weights   09/07/19 1301  Weight: 121.1 kg    Gen: 69 y.o. male in no distress  Pulm: Non-labored breathing room air. Clear to auscultation bilaterally.  CV: Regular rate and rhythm. No murmur, rub, or gallop. No JVD, no pitting pedal edema. GI: Abdomen soft, tender in LLQ without rebound guarding or rigidity, non-distended, with normoactive bowel sounds. No organomegaly or masses felt. Ext: Warm, no deformities Skin: No rashes, lesions or ulcers Neuro: Alert and  oriented. No focal neurological deficits. Psych: Judgement and insight appear normal. Mood & affect appropriate.   Data Reviewed: I have personally reviewed following labs and imaging studies  CBC: Recent Labs  Lab 09/07/19 1512 09/08/19 0451  WBC 9.0 8.5  NEUTROABS 6.2 5.8  HGB 15.3 14.8  HCT 45.6 44.7  MCV 87.5 87.8  PLT 290 803   Basic Metabolic Panel: Recent Labs  Lab 09/07/19 1512 09/08/19 0451  NA 132* 136  K 3.8 3.7  CL 93* 99  CO2 27 28  GLUCOSE 109* 102*  BUN 14 11  CREATININE 0.87 0.79  CALCIUM 9.4 9.0  MG  --  2.1  PHOS  --  3.7   GFR: Estimated Creatinine Clearance: 120.6 mL/min (by C-G formula based on SCr of 0.79 mg/dL). Liver Function  Tests: Recent Labs  Lab 09/07/19 1512 09/08/19 0451  AST 39 43*  ALT 43 48*  ALKPHOS 66 63  BILITOT 1.0 0.9  PROT 8.0 7.3  ALBUMIN 4.4 4.0   Recent Labs  Lab 09/07/19 1512  LIPASE 21   No results for input(s): AMMONIA in the last 168 hours. Coagulation Profile: No results for input(s): INR, PROTIME in the last 168 hours. Cardiac Enzymes: No results for input(s): CKTOTAL, CKMB, CKMBINDEX, TROPONINI in the last 168 hours. BNP (last 3 results) No results for input(s): PROBNP in the last 8760 hours. HbA1C: Recent Labs    09/07/19 2048  HGBA1C 6.7*   CBG: Recent Labs  Lab 09/07/19 2322 09/08/19 0415 09/08/19 0755 09/08/19 1146 09/08/19 1641  GLUCAP 99 111* 96 116* 164*   Lipid Profile: No results for input(s): CHOL, HDL, LDLCALC, TRIG, CHOLHDL, LDLDIRECT in the last 72 hours. Thyroid Function Tests: Recent Labs    09/08/19 0451  TSH 2.743   Anemia Panel: No results for input(s): VITAMINB12, FOLATE, FERRITIN, TIBC, IRON, RETICCTPCT in the last 72 hours. Urine analysis:    Component Value Date/Time   COLORURINE YELLOW 12/21/2012 1341   APPEARANCEUR CLEAR 12/21/2012 1341   LABSPEC 1.017 12/21/2012 1341   PHURINE 6.5 12/21/2012 1341   GLUCOSEU NEGATIVE 12/21/2012 1341   HGBUR NEGATIVE 12/21/2012 1341   BILIRUBINUR NEGATIVE 12/21/2012 1341   KETONESUR NEGATIVE 12/21/2012 1341   PROTEINUR NEGATIVE 12/21/2012 1341   UROBILINOGEN 1.0 12/21/2012 1341   NITRITE NEGATIVE 12/21/2012 1341   LEUKOCYTESUR NEGATIVE 12/21/2012 1341   Recent Results (from the past 240 hour(s))  SARS Coronavirus 2 by RT PCR (hospital order, performed in [facility] hospital lab) Nasopharyngeal Nasopharyngeal Swab     Status: None   Collection Time: 09/07/19  8:01 PM   Specimen: Nasopharyngeal Swab  Result Value Ref Range Status   SARS Coronavirus 2 NEGATIVE NEGATIVE Final    Comment: (NOTE) SARS-CoV-2 target nucleic acids are NOT DETECTED.  The SARS-CoV-2 RNA is generally detectable  in upper and lower respiratory specimens during the acute phase of infection. The lowest concentration of SARS-CoV-2 viral copies this assay can detect is 250 copies / mL. A negative result does not preclude SARS-CoV-2 infection and should not be used as the sole basis for treatment or other patient management decisions.  A negative result may occur with improper specimen collection / handling, submission of specimen other than nasopharyngeal swab, presence of viral mutation(s) within the areas targeted by this assay, and inadequate number of viral copies (<250 copies / mL). A negative result must be combined with clinical observations, patient history, and epidemiological information.  Fact Sheet for Patients:   StrictlyIdeas.no  Fact Sheet  for Healthcare Providers: BankingDealers.co.za  This test is not yet approved or  cleared by the [place] and has been authorized for detection and/or diagnosis of SARS-CoV-2 by FDA under an Emergency Use Authorization (EUA).  This EUA will remain in effect (meaning this test can be used) for the duration of the COVID-19 declaration under Section 564(b)(1) of the Act, 21 U.S.C. section 360bbb-3(b)(1), unless the authorization is terminated or revoked sooner.  Performed at [facility] [place] [address] [place], [place] [address]       Radiology Studies: CT Abdomen Pelvis W Contrast  Result Date: 09/07/2019 CLINICAL DATA:  Severe abdominal pain. EXAM: CT ABDOMEN AND PELVIS WITH CONTRAST TECHNIQUE: Multidetector CT imaging of the abdomen and pelvis was performed using the standard protocol following bolus administration of intravenous contrast. CONTRAST:  122mL OMNIPAQUE IOHEXOL 300 MG/ML  SOLN COMPARISON:  10/09/2011. FINDINGS: Lower chest: There is a stable pulmonary nodule in the left lower lobe.The heart size is normal. Hepatobiliary: The liver is normal. Normal  gallbladder.There is no biliary ductal dilation. Pancreas: Normal contours without ductal dilatation. No peripancreatic fluid collection. Spleen: Unremarkable. Adrenals/Urinary Tract: --Adrenal glands: Unremarkable. --Right kidney/ureter: No hydronephrosis or radiopaque kidney stones. --Left kidney/ureter: No hydronephrosis or radiopaque kidney stones. --Urinary bladder: Unremarkable. Stomach/Bowel: --Stomach/Duodenum: No hiatal hernia or other gastric abnormality. Normal duodenal course and caliber. --Small bowel: Unremarkable. --Colon: There is a large amount of stool throughout the colon. There are findings concerning for uncomplicated sigmoid diverticulitis. There is a focal narrowing of the distal descending colon/proximal sigmoid colon. --Appendix: Normal. Vascular/Lymphatic: Atherosclerotic calcification is present within the non-aneurysmal abdominal aorta, without hemodynamically significant stenosis. 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 --No retroperitoneal lymphadenopathy. --No mesenteric lymphadenopathy. --No pelvic or inguinal lymphadenopathy. Reproductive: Unremarkable Other: No ascites or free air. There are bilateral fat containing inguinal hernias, right greater than left. Musculoskeletal. No acute displaced fractures. IMPRESSION: 1. Findings concerning for uncomplicated sigmoid diverticulitis. 2. Large amount of stool throughout the colon. 3. There is a focal narrowing of the distal descending colon/proximal sigmoid colon. Correlation with outpatient colonoscopy is recommended. 4. 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 5. Bilateral fat containing inguinal hernias, right greater than left. Aortic Atherosclerosis (ICD10-I70.0). Electronically Signed   By: Constance Holster M.D.   On: 09/07/2019 18:56   DG CHEST PORT 1 VIEW  Result Date: 09/07/2019 CLINICAL DATA:  Chills, chest pain, previous tobacco abuse, severe abdominal pain EXAM: PORTABLE CHEST 1 VIEW  COMPARISON:  07/30/2019 FINDINGS: Two frontal views of the chest are obtained. Cardiac silhouette is unremarkable. No airspace disease, effusion, or pneumothorax. No acute bony abnormalities. IMPRESSION: 1. No acute intrathoracic process. Electronically Signed   By: Randa Ngo M.D.   On: 09/07/2019 21:05   ECHOCARDIOGRAM COMPLETE  Result Date: 09/08/2019    ECHOCARDIOGRAM REPORT   Patient Name:   [name]. Date of Exam: 09/08/2019 Medical Rec #:  [id number]           Height:       74.0 in Accession #:    [id number]          Weight:       267.0 lb Date of Birth:  Jul 31, 1950            BSA:          2.457 m Patient Age:    61 years            BP:  144/68 mmHg Patient Gender: M                   HR:           59 bpm. Exam Location:  Inpatient Procedure: 2D Echo, Cardiac Doppler and Color Doppler Indications:    Chest pain, abdominal pain  History:        Patient has prior history of Echocardiogram examinations, most                 recent 12/25/2012. PVD, Signs/Symptoms:Chest Pain; Risk                 Factors:Diabetes, Hypertension, Dyslipidemia and Obesity.  Sonographer:    Dustin Flock Referring Phys: [place]  1. Left ventricular ejection fraction, by estimation, is 60 to 65%. The left ventricle has normal function. The left ventricle has no regional wall motion abnormalities. Left ventricular diastolic parameters are consistent with Grade I diastolic dysfunction (impaired relaxation). Elevated left atrial pressure.  2. Right ventricular systolic function is normal. The right ventricular size is mildly enlarged. Tricuspid regurgitation signal is inadequate for assessing PA pressure.  3. Left atrial size was mildly dilated.  4. The mitral valve is normal in structure. Trivial mitral valve regurgitation. No evidence of mitral stenosis.  5. The aortic valve has an indeterminant number of cusps. Aortic valve regurgitation is not visualized. Mild aortic valve  stenosis.  6. The inferior vena cava is dilated in size with >50% respiratory variability, suggesting right atrial pressure of 8 mmHg. FINDINGS  Left Ventricle: Left ventricular ejection fraction, by estimation, is 60 to 65%. The left ventricle has normal function. The left ventricle has no regional wall motion abnormalities. The left ventricular internal cavity size was normal in size. There is  no left ventricular hypertrophy. Left ventricular diastolic parameters are consistent with Grade I diastolic dysfunction (impaired relaxation). Elevated left atrial pressure. Right Ventricle: The right ventricular size is mildly enlarged.Right ventricular systolic function is normal. Tricuspid regurgitation signal is inadequate for assessing PA pressure. The tricuspid regurgitant velocity is 2.55 m/s, and with an assumed right atrial pressure of 8 mmHg, the estimated right ventricular systolic pressure is 33.5 mmHg. Left Atrium: Left atrial size was mildly dilated. Right Atrium: Right atrial size was normal in size. Pericardium: There is no evidence of pericardial effusion. Mitral Valve: The mitral valve is normal in structure. Normal mobility of the mitral valve leaflets. Moderate mitral annular calcification. Trivial mitral valve regurgitation. No evidence of mitral valve stenosis. Tricuspid Valve: The tricuspid valve is normal in structure. Tricuspid valve regurgitation is trivial. No evidence of tricuspid stenosis. Aortic Valve: The aortic valve has an indeterminant number of cusps. Aortic valve regurgitation is not visualized. Mild aortic stenosis is present. Aortic valve mean gradient measures 12.0 mmHg. Aortic valve peak gradient measures 20.3 mmHg. Aortic valve  area, by VTI measures 2.68 cm. Pulmonic Valve: The pulmonic valve was not well visualized. Pulmonic valve regurgitation is not visualized. No evidence of pulmonic stenosis. Aorta: The aortic root is normal in size and structure. Venous: The inferior vena  cava is dilated in size with greater than 50% respiratory variability, suggesting right atrial pressure of 8 mmHg.  LEFT VENTRICLE PLAX 2D LVIDd:         5.10 cm  Diastology LVIDs:         2.90 cm  LV e' lateral:   6.31 cm/s LV PW:         1.30 cm  LV E/e' lateral: 22.3 LV IVS:        1.10 cm  LV e' medial:    6.85 cm/s LVOT diam:     2.30 cm  LV E/e' medial:  20.6 LV SV:         127 LV SV Index:   52 LVOT Area:     4.15 cm  RIGHT VENTRICLE RV Basal diam:  4.30 cm RV S prime:     12.80 cm/s TAPSE (M-mode): 2.9 cm LEFT ATRIUM             Index       RIGHT ATRIUM           Index LA diam:        5.20 cm 2.12 cm/m  RA Area:     23.40 cm LA Vol (A2C):   96.7 ml 39.35 ml/m RA Volume:   80.90 ml  32.92 ml/m LA Vol (A4C):   82.7 ml 33.65 ml/m LA Biplane Vol: 92.8 ml 37.76 ml/m  AORTIC VALVE AV Area (Vmax):    2.73 cm AV Area (Vmean):   2.72 cm AV Area (VTI):     2.68 cm AV Vmax:           225.50 cm/s AV Vmean:          159.000 cm/s AV VTI:            0.475 m AV Peak Grad:      20.3 mmHg AV Mean Grad:      12.0 mmHg LVOT Vmax:         148.00 cm/s LVOT Vmean:        104.000 cm/s LVOT VTI:          0.306 m LVOT/AV VTI ratio: 0.64  AORTA Ao Root diam: 2.40 cm MITRAL VALVE                TRICUSPID VALVE MV Area (PHT): 2.50 cm     TR Peak grad:   26.0 mmHg MV Decel Time: 303 msec     TR Vmax:        255.00 cm/s MV E velocity: 141.00 cm/s MV A velocity: 158.00 cm/s  SHUNTS MV E/A ratio:  0.89         Systemic VTI:  0.31 m                             Systemic Diam: 2.30 cm Kirk Ruths MD Electronically signed by Kirk Ruths MD Signature Date/Time: 09/08/2019/4:38:02 PM    Final    VAS [place] LOWER EXTREMITY VENOUS (DVT)  Result Date: 09/08/2019  Lower Venous DVTStudy Indications: Pain.  Risk Factors: DVT History of DVT in LT in 2006. Comparison Study: No prior studies. Performing Technologist: Darlin Coco  Examination Guidelines: A complete evaluation includes B-mode imaging, spectral Doppler, color Doppler, and  power Doppler as needed of all accessible portions of each vessel. Bilateral testing is considered an integral part of a complete examination. Limited examinations for reoccurring indications may be performed as noted. The reflux portion of the exam is performed with the patient in reverse Trendelenburg.  +-----+---------------+---------+-----------+----------+--------------+ RIGHTCompressibilityPhasicitySpontaneityPropertiesThrombus Aging +-----+---------------+---------+-----------+----------+--------------+ CFV  Full           Yes      Yes                                 +-----+---------------+---------+-----------+----------+--------------+   +---------+---------------+---------+-----------+----------+--------------+  LEFT     CompressibilityPhasicitySpontaneityPropertiesThrombus Aging +---------+---------------+---------+-----------+----------+--------------+ CFV      Full           Yes      Yes                                 +---------+---------------+---------+-----------+----------+--------------+ SFJ      Full                                                        +---------+---------------+---------+-----------+----------+--------------+ FV Prox  Full                                                        +---------+---------------+---------+-----------+----------+--------------+ FV Mid   Full                                                        +---------+---------------+---------+-----------+----------+--------------+ FV DistalFull                                                        +---------+---------------+---------+-----------+----------+--------------+ PFV      Full                                                        +---------+---------------+---------+-----------+----------+--------------+ POP      Full           Yes      Yes                                  +---------+---------------+---------+-----------+----------+--------------+ PTV      Full                                                        +---------+---------------+---------+-----------+----------+--------------+ PERO     Full                                                        +---------+---------------+---------+-----------+----------+--------------+     Summary: RIGHT: - No evidence of common femoral vein obstruction.  LEFT: - There is no evidence of deep vein thrombosis in the lower extremity.  - No cystic structure found in the popliteal fossa.  *See table(s) above for measurements and observations.  Preliminary     Scheduled Meds: . amLODipine  10 mg Oral Daily  . carvedilol  12.5 mg Oral BID WC  . docusate sodium  100 mg Oral BID  . enoxaparin (LOVENOX) injection  60 mg Subcutaneous Q24H  . insulin aspart  0-9 Units Subcutaneous Q4H  . insulin glargine  20 Units Subcutaneous BID  . polyethylene glycol  17 g Oral BID  . senna  2 tablet Oral BID  . sodium chloride flush  3 mL Intravenous Q12H  . terazosin  5 mg Oral BID  . thiamine  100 mg Oral Daily   Continuous Infusions: . lactated ringers 75 mL/hr at 09/08/19 1856  . piperacillin-tazobactam (ZOSYN)  IV 3.375 g (09/08/19 1342)     LOS: 1 day   Time spent: 25 minutes.  Patrecia Pour, MD [facility] [email or web address] 09/08/2019, 7:28 PM

## 2019-09-08 NOTE — Progress Notes (Signed)
Lower extremity venous LT study completed.   See Cv Proc for preliminary results.   Oluwatosin Bracy  

## 2019-09-09 ENCOUNTER — Encounter (HOSPITAL_COMMUNITY): Payer: Self-pay | Admitting: Internal Medicine

## 2019-09-09 LAB — GLUCOSE, CAPILLARY
Glucose-Capillary: 84 mg/dL (ref 70–99)
Glucose-Capillary: 68 mg/dL — ABNORMAL LOW (ref 70–99)
Glucose-Capillary: 95 mg/dL (ref 70–99)
Glucose-Capillary: 149 mg/dL — ABNORMAL HIGH (ref 70–99)
Glucose-Capillary: 99 mg/dL (ref 70–99)
Glucose-Capillary: 149 mg/dL — ABNORMAL HIGH (ref 70–99)

## 2019-09-09 LAB — COMPREHENSIVE METABOLIC PANEL
ALT: 44 U/L (ref 0–44)
AST: 32 U/L (ref 15–41)
Albumin: 3.5 g/dL (ref 3.5–5.0)
Alkaline Phosphatase: 59 U/L (ref 38–126)
Anion gap: 8 (ref 5–15)
BUN: 7 mg/dL — ABNORMAL LOW (ref 8–23)
CO2: 29 mmol/L (ref 22–32)
Calcium: 9 mg/dL (ref 8.9–10.3)
Chloride: 98 mmol/L (ref 98–111)
Creatinine, Ser: 0.88 mg/dL (ref 0.61–1.24)
GFR calc Af Amer: >60 mL/min (ref >60)
GFR calc non Af Amer: >60 mL/min (ref >60)
Glucose, Bld: 82 mg/dL (ref 70–99)
Potassium: 4 mmol/L (ref 3.5–5.1)
Sodium: 135 mmol/L (ref 135–145)
Total Bilirubin: 0.9 mg/dL (ref 0.3–1.2)
Total Protein: 6.6 g/dL (ref 6.5–8.1)

## 2019-09-09 MED ORDER — PIPERACILLIN-TAZOBACTAM 3.375 G IVPB 30 MIN
3.3750 g | Freq: Four times a day (QID) | INTRAVENOUS | Status: DC
  Administered 2019-09-09 – 2019-09-10 (×2): 3.375 g via INTRAVENOUS
  Filled 2019-09-09 (×8): qty 50

## 2019-09-09 NOTE — Progress Notes (Addendum)
PROGRESS NOTE  Alan Prose.  JKK:[id number] DOB: 1950-06-13 DOA: 09/07/2019 PCP: [facility]   Brief Narrative: Alan Penado. is a 69 y.o. male with a history of T2DM, HTN, right carotid stenosis, SDH, DVT, OSA, and diverticulitis who presented to the ED with LLQ abdominal pain, subjective fevers despite taking cipro and flagyl as an outpatient.    Assessment & Plan: Active Problems:   Essential hypertension, benign   Type 2 diabetes mellitus with hyperglycemia, with long-term current use of insulin (HCC)   Chest pain   Sleep apnea   Hyponatremia   Diverticulitis   Constipated   Dehydration   Constipation   Abnormal CT of the abdomen  Acute diverticulitis: Recurrent, moderately severe based on pain and CT scan. No leukocytosis.  - No IV pain medications x24 hours, will advance to full liquids for lunch. If tolerates, start soft diet this PM.  - Continuing zosyn despite some mild intolerance. Has had intolerance to many other abx as well. Will likely be able to DC this soon if remains afebrile with improved pain. No leukocytosis.  Constipation: Needs improved control acutely and chronically - Bowel regimen to continue. Can use suppository if refractory, but would prefer to avoid if possible.  Multiple drug intolerances:  - Benadryl prn, does not currently require IV administration, monitor for true anaphylaxis  T2DM: HbA1c 6.7%, well controlled - Continue home lantus, basal-bolus insulins. Mild hypoglycemia this AM, but suspect this will improve with advancing diet today.  Left leg tenderness, history of DVT:  - Negative venous U/S  Chest pain: Resolved. Troponin wnl, no ischemic ECG findings. No wall motion abnormalities on echo.  OSA:  - CPAP  HTN:  - Continue medications as ordered.   Hyponatremia: Resolved.  Colonic narrowing: Discussed with patient, suspect this could be stricture related to recurrent bouts of diverticulitis. NEEDS to have  colonoscopy following recovery after discharge.   Obesity: Estimated body mass index is 34.28 kg/m as calculated from the following:   Height as of this encounter: 6\' 2"  (1.88 m).   Weight as of this encounter: 121.1 kg.  DVT prophylaxis: Lovenox Code Status: Full Family Communication: None at bedside Disposition Plan:  Status is: Inpatient  Remains inpatient appropriate because:Ongoing active pain requiring inpatient pain management   Dispo: The patient is from: Home              Anticipated d/c is to: Home              Anticipated d/c date is: 1 day if tolerating po and pain remains controlled.              Patient currently is not medically stable to d/c.  Consultants:   None  Procedures:   Echocardiogram:  1. Left ventricular ejection fraction, by estimation, is 60 to 65%. The  left ventricle has normal function. The left ventricle has no regional  wall motion abnormalities. Left ventricular diastolic parameters are  consistent with Grade I diastolic  dysfunction (impaired relaxation). Elevated left atrial pressure.  2. Right ventricular systolic function is normal. The right ventricular  size is mildly enlarged. Tricuspid regurgitation signal is inadequate for  assessing PA pressure.  3. Left atrial size was mildly dilated.  4. The mitral valve is normal in structure. Trivial mitral valve  regurgitation. No evidence of mitral stenosis.  5. The aortic valve has an indeterminant number of cusps. Aortic valve  regurgitation is not visualized. Mild aortic valve stenosis.  6.  The inferior vena cava is dilated in size with >50% respiratory  variability, suggesting right atrial pressure of 8 mmHg.   Antimicrobials:  Zosyn   Subjective: Has not used morphine in >24 hrs, LLQ pain is improved. Wants to advance diet. Tolerating zosyn w/benadryl.  Objective: Vitals:   09/08/19 2046 09/09/19 0507 09/09/19 0622 09/09/19 1453  BP: (!) 152/74 132/75 (!) 160/69 (!)  146/65  Pulse: (!) 58 61 (!) 59 (!) 56  Resp: 18 20 20 19   Temp: 98 F (36.7 C) 98.2 F (36.8 C) 98.4 F (36.9 C) 98.1 F (36.7 C)  TempSrc: Oral Oral Oral   SpO2: 98% 99% 99% 99%  Weight:      Height:        Intake/Output Summary (Last 24 hours) at 09/09/2019 1525 Last data filed at 09/09/2019 1400 Gross per 24 hour  Intake 637.63 ml  Output --  Net 637.63 ml   Filed Weights   09/07/19 1301  Weight: 121.1 kg   Gen: 69 y.o. male in no distress Pulm: Nonlabored breathing room air. Clear. CV: Regular rate and rhythm. No murmur, rub, or gallop. No JVD, no dependent edema. GI: Abdomen soft, minimally tender, non-distended, with normoactive bowel sounds.  Ext: Warm, no deformities Skin: No rashes, lesions or ulcers on visualized skin. Neuro: Alert and oriented. No focal neurological deficits. Psych: Judgement and insight appear fair. Mood euthymic & affect congruent. Behavior is appropriate.    Data Reviewed: I have personally reviewed following labs and imaging studies  CBC: Recent Labs  Lab 09/07/19 1512 09/08/19 0451  WBC 9.0 8.5  NEUTROABS 6.2 5.8  HGB 15.3 14.8  HCT 45.6 44.7  MCV 87.5 87.8  PLT 290 706   Basic Metabolic Panel: Recent Labs  Lab 09/07/19 1512 09/08/19 0451 09/09/19 0440  NA 132* 136 135  K 3.8 3.7 4.0  CL 93* 99 98  CO2 27 28 29   GLUCOSE 109* 102* 82  BUN 14 11 7*  CREATININE 0.87 0.79 0.88  CALCIUM 9.4 9.0 9.0  MG  --  2.1  --   PHOS  --  3.7  --    GFR: Estimated Creatinine Clearance: 109.6 mL/min (by C-G formula based on SCr of 0.88 mg/dL). Liver Function Tests: Recent Labs  Lab 09/07/19 1512 09/08/19 0451 09/09/19 0440  AST 39 43* 32  ALT 43 48* 44  ALKPHOS 66 63 59  BILITOT 1.0 0.9 0.9  PROT 8.0 7.3 6.6  ALBUMIN 4.4 4.0 3.5   Recent Labs  Lab 09/07/19 1512  LIPASE 21   No results for input(s): AMMONIA in the last 168 hours. Coagulation Profile: No results for input(s): INR, PROTIME in the last 168  hours. Cardiac Enzymes: No results for input(s): CKTOTAL, CKMB, CKMBINDEX, TROPONINI in the last 168 hours. BNP (last 3 results) No results for input(s): PROBNP in the last 8760 hours. HbA1C: Recent Labs    09/07/19 2048  HGBA1C 6.7*   CBG: Recent Labs  Lab 09/08/19 2353 09/09/19 0503 09/09/19 0752 09/09/19 0838 09/09/19 1141  GLUCAP 73 84 68* 95 149*   Lipid Profile: No results for input(s): CHOL, HDL, LDLCALC, TRIG, CHOLHDL, LDLDIRECT in the last 72 hours. Thyroid Function Tests: Recent Labs    09/08/19 0451  TSH 2.743   Anemia Panel: No results for input(s): VITAMINB12, FOLATE, FERRITIN, TIBC, IRON, RETICCTPCT in the last 72 hours. Urine analysis:    Component Value Date/Time   COLORURINE YELLOW 12/21/2012 [place] 12/21/2012 1341  LABSPEC 1.017 12/21/2012 1341   PHURINE 6.5 12/21/2012 1341   GLUCOSEU NEGATIVE 12/21/2012 1341   HGBUR NEGATIVE 12/21/2012 1341   BILIRUBINUR NEGATIVE 12/21/2012 1341   KETONESUR NEGATIVE 12/21/2012 1341   PROTEINUR NEGATIVE 12/21/2012 1341   UROBILINOGEN 1.0 12/21/2012 1341   NITRITE NEGATIVE 12/21/2012 1341   LEUKOCYTESUR NEGATIVE 12/21/2012 1341   Recent Results (from the past 240 hour(s))  SARS Coronavirus 2 by RT PCR (hospital order, performed in [facility] hospital lab) Nasopharyngeal Nasopharyngeal Swab     Status: None   Collection Time: 09/07/19  8:01 PM   Specimen: Nasopharyngeal Swab  Result Value Ref Range Status   SARS Coronavirus 2 NEGATIVE NEGATIVE Final    Comment: (NOTE) SARS-CoV-2 target nucleic acids are NOT DETECTED.  The SARS-CoV-2 RNA is generally detectable in upper and lower respiratory specimens during the acute phase of infection. The lowest concentration of SARS-CoV-2 viral copies this assay can detect is 250 copies / mL. A negative result does not preclude SARS-CoV-2 infection and should not be used as the sole basis for treatment or other patient management decisions.  A  negative result may occur with improper specimen collection / handling, submission of specimen other than nasopharyngeal swab, presence of viral mutation(s) within the areas targeted by this assay, and inadequate number of viral copies (<250 copies / mL). A negative result must be combined with clinical observations, patient history, and epidemiological information.  Fact Sheet for Patients:   StrictlyIdeas.no  Fact Sheet for Healthcare Providers: BankingDealers.co.za  This test is not yet approved or  cleared by the [place] FDA and has been authorized for detection and/or diagnosis of SARS-CoV-2 by FDA under an Emergency Use Authorization (EUA).  This EUA will remain in effect (meaning this test can be used) for the duration of the COVID-19 declaration under Section 564(b)(1) of the Act, 21 U.S.C. section 360bbb-3(b)(1), unless the authorization is terminated or revoked sooner.  Performed at [facility], [place] [address], [place], [place] [address]       Radiology Studies: CT Abdomen Pelvis W Contrast  Result Date: 09/07/2019 CLINICAL DATA:  Severe abdominal pain. EXAM: CT ABDOMEN AND PELVIS WITH CONTRAST TECHNIQUE: Multidetector CT imaging of the abdomen and pelvis was performed using the standard protocol following bolus administration of intravenous contrast. CONTRAST:  122mL OMNIPAQUE IOHEXOL 300 MG/ML  SOLN COMPARISON:  10/09/2011. FINDINGS: Lower chest: There is a stable pulmonary nodule in the left lower lobe.The heart size is normal. Hepatobiliary: The liver is normal. Normal gallbladder.There is no biliary ductal dilation. Pancreas: Normal contours without ductal dilatation. No peripancreatic fluid collection. Spleen: Unremarkable. Adrenals/Urinary Tract: --Adrenal glands: Unremarkable. --Right kidney/ureter: No hydronephrosis or radiopaque kidney stones. --Left kidney/ureter: No hydronephrosis or radiopaque  kidney stones. --Urinary bladder: Unremarkable. Stomach/Bowel: --Stomach/Duodenum: No hiatal hernia or other gastric abnormality. Normal duodenal course and caliber. --Small bowel: Unremarkable. --Colon: There is a large amount of stool throughout the colon. There are findings concerning for uncomplicated sigmoid diverticulitis. There is a focal narrowing of the distal descending colon/proximal sigmoid colon. --Appendix: Normal. Vascular/Lymphatic: Atherosclerotic calcification is present within the non-aneurysmal abdominal aorta, without hemodynamically significant stenosis. 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 --No retroperitoneal lymphadenopathy. --No mesenteric lymphadenopathy. --No pelvic or inguinal lymphadenopathy. Reproductive: Unremarkable Other: No ascites or free air. There are bilateral fat containing inguinal hernias, right greater than left. Musculoskeletal. No acute displaced fractures. IMPRESSION: 1. Findings concerning for uncomplicated sigmoid diverticulitis. 2. Large amount of stool throughout the  colon. 3. There is a focal narrowing of the distal descending colon/proximal sigmoid colon. Correlation with outpatient colonoscopy is recommended. 4. 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 5. Bilateral fat containing inguinal hernias, right greater than left. Aortic Atherosclerosis (ICD10-I70.0). Electronically Signed   By: Constance Holster M.D.   On: 09/07/2019 18:56   DG CHEST PORT 1 VIEW  Result Date: 09/07/2019 CLINICAL DATA:  Chills, chest pain, previous tobacco abuse, severe abdominal pain EXAM: PORTABLE CHEST 1 VIEW COMPARISON:  07/30/2019 FINDINGS: Two frontal views of the chest are obtained. Cardiac silhouette is unremarkable. No airspace disease, effusion, or pneumothorax. No acute bony abnormalities. IMPRESSION: 1. No acute intrathoracic process. Electronically Signed   By: Randa Ngo M.D.   On: 09/07/2019 21:05   ECHOCARDIOGRAM  COMPLETE  Result Date: 09/08/2019    ECHOCARDIOGRAM REPORT   Patient Name:   Alan Oquendo. Date of Exam: 09/08/2019 Medical Rec #:  [id number]           Height:       74.0 in Accession #:    [id number]          Weight:       267.0 lb Date of Birth:  03/23/1950            BSA:          2.457 m Patient Age:    70 years            BP:           144/68 mmHg Patient Gender: M                   HR:           59 bpm. Exam Location:  Inpatient Procedure: 2D Echo, Cardiac Doppler and Color Doppler Indications:    Chest pain, abdominal pain  History:        Patient has prior history of Echocardiogram examinations, most                 recent 12/25/2012. PVD, Signs/Symptoms:Chest Pain; Risk                 Factors:Diabetes, Hypertension, Dyslipidemia and Obesity.  Sonographer:    Dustin Flock Referring Phys: [place]  1. Left ventricular ejection fraction, by estimation, is 60 to 65%. The left ventricle has normal function. The left ventricle has no regional wall motion abnormalities. Left ventricular diastolic parameters are consistent with Grade I diastolic dysfunction (impaired relaxation). Elevated left atrial pressure.  2. Right ventricular systolic function is normal. The right ventricular size is mildly enlarged. Tricuspid regurgitation signal is inadequate for assessing PA pressure.  3. Left atrial size was mildly dilated.  4. The mitral valve is normal in structure. Trivial mitral valve regurgitation. No evidence of mitral stenosis.  5. The aortic valve has an indeterminant number of cusps. Aortic valve regurgitation is not visualized. Mild aortic valve stenosis.  6. The inferior vena cava is dilated in size with >50% respiratory variability, suggesting right atrial pressure of 8 mmHg. FINDINGS  Left Ventricle: Left ventricular ejection fraction, by estimation, is 60 to 65%. The left ventricle has normal function. The left ventricle has no regional wall motion abnormalities. The  left ventricular internal cavity size was normal in size. There is  no left ventricular hypertrophy. Left ventricular diastolic parameters are consistent with Grade I diastolic dysfunction (impaired relaxation). Elevated left atrial pressure. Right Ventricle: The right ventricular size is mildly  enlarged.Right ventricular systolic function is normal. Tricuspid regurgitation signal is inadequate for assessing PA pressure. The tricuspid regurgitant velocity is 2.55 m/s, and with an assumed right atrial pressure of 8 mmHg, the estimated right ventricular systolic pressure is 10.2 mmHg. Left Atrium: Left atrial size was mildly dilated. Right Atrium: Right atrial size was normal in size. Pericardium: There is no evidence of pericardial effusion. Mitral Valve: The mitral valve is normal in structure. Normal mobility of the mitral valve leaflets. Moderate mitral annular calcification. Trivial mitral valve regurgitation. No evidence of mitral valve stenosis. Tricuspid Valve: The tricuspid valve is normal in structure. Tricuspid valve regurgitation is trivial. No evidence of tricuspid stenosis. Aortic Valve: The aortic valve has an indeterminant number of cusps. Aortic valve regurgitation is not visualized. Mild aortic stenosis is present. Aortic valve mean gradient measures 12.0 mmHg. Aortic valve peak gradient measures 20.3 mmHg. Aortic valve  area, by VTI measures 2.68 cm. Pulmonic Valve: The pulmonic valve was not well visualized. Pulmonic valve regurgitation is not visualized. No evidence of pulmonic stenosis. Aorta: The aortic root is normal in size and structure. Venous: The inferior vena cava is dilated in size with greater than 50% respiratory variability, suggesting right atrial pressure of 8 mmHg.  LEFT VENTRICLE PLAX 2D LVIDd:         5.10 cm  Diastology LVIDs:         2.90 cm  LV e' lateral:   6.31 cm/s LV PW:         1.30 cm  LV E/e' lateral: 22.3 LV IVS:        1.10 cm  LV e' medial:    6.85 cm/s LVOT diam:      2.30 cm  LV E/e' medial:  20.6 LV SV:         127 LV SV Index:   52 LVOT Area:     4.15 cm  RIGHT VENTRICLE RV Basal diam:  4.30 cm RV S prime:     12.80 cm/s TAPSE (M-mode): 2.9 cm LEFT ATRIUM             Index       RIGHT ATRIUM           Index LA diam:        5.20 cm 2.12 cm/m  RA Area:     23.40 cm LA Vol (A2C):   96.7 ml 39.35 ml/m RA Volume:   80.90 ml  32.92 ml/m LA Vol (A4C):   82.7 ml 33.65 ml/m LA Biplane Vol: 92.8 ml 37.76 ml/m  AORTIC VALVE AV Area (Vmax):    2.73 cm AV Area (Vmean):   2.72 cm AV Area (VTI):     2.68 cm AV Vmax:           225.50 cm/s AV Vmean:          159.000 cm/s AV VTI:            0.475 m AV Peak Grad:      20.3 mmHg AV Mean Grad:      12.0 mmHg LVOT Vmax:         148.00 cm/s LVOT Vmean:        104.000 cm/s LVOT VTI:          0.306 m LVOT/AV VTI ratio: 0.64  AORTA Ao Root diam: 2.40 cm MITRAL VALVE                TRICUSPID VALVE MV Area (PHT): 2.50 cm  TR Peak grad:   26.0 mmHg MV Decel Time: 303 msec     TR Vmax:        255.00 cm/s MV E velocity: 141.00 cm/s MV A velocity: 158.00 cm/s  SHUNTS MV E/A ratio:  0.89         Systemic VTI:  0.31 m                             Systemic Diam: 2.30 cm Kirk Ruths MD Electronically signed by Kirk Ruths MD Signature Date/Time: 09/08/2019/4:38:02 PM    Final    VAS [place] LOWER EXTREMITY VENOUS (DVT)  Result Date: 09/09/2019  Lower Venous DVTStudy Indications: Pain.  Risk Factors: DVT History of DVT in LT in 2006. Comparison Study: No prior studies. Performing Technologist: Darlin Coco  Examination Guidelines: A complete evaluation includes B-mode imaging, spectral Doppler, color Doppler, and power Doppler as needed of all accessible portions of each vessel. Bilateral testing is considered an integral part of a complete examination. Limited examinations for reoccurring indications may be performed as noted. The reflux portion of the exam is performed with the patient in reverse Trendelenburg.   +-----+---------------+---------+-----------+----------+--------------+  RIGHT Compressibility Phasicity Spontaneity Properties Thrombus Aging  +-----+---------------+---------+-----------+----------+--------------+  CFV   Full            Yes       Yes                                    +-----+---------------+---------+-----------+----------+--------------+   +---------+---------------+---------+-----------+----------+--------------+  LEFT      Compressibility Phasicity Spontaneity Properties Thrombus Aging  +---------+---------------+---------+-----------+----------+--------------+  CFV       Full            Yes       Yes                                    +---------+---------------+---------+-----------+----------+--------------+  SFJ       Full                                                             +---------+---------------+---------+-----------+----------+--------------+  FV Prox   Full                                                             +---------+---------------+---------+-----------+----------+--------------+  FV Mid    Full                                                             +---------+---------------+---------+-----------+----------+--------------+  FV Distal Full                                                             +---------+---------------+---------+-----------+----------+--------------+  PFV       Full                                                             +---------+---------------+---------+-----------+----------+--------------+  POP       Full            Yes       Yes                                    +---------+---------------+---------+-----------+----------+--------------+  PTV       Full                                                             +---------+---------------+---------+-----------+----------+--------------+  PERO      Full                                                              +---------+---------------+---------+-----------+----------+--------------+     Summary: RIGHT: - No evidence of common femoral vein obstruction.  LEFT: - There is no evidence of deep vein thrombosis in the lower extremity.  - No cystic structure found in the popliteal fossa.  *See table(s) above for measurements and observations. Electronically signed by Ruta Hinds MD on 09/09/2019 at 10:08:50 AM.    Final     Scheduled Meds:  amLODipine  10 mg Oral Daily   carvedilol  12.5 mg Oral BID WC   docusate sodium  100 mg Oral BID   enoxaparin (LOVENOX) injection  60 mg Subcutaneous Q24H   insulin aspart  0-9 Units Subcutaneous Q4H   insulin glargine  20 Units Subcutaneous BID   polyethylene glycol  17 g Oral BID   senna  2 tablet Oral BID   sodium chloride flush  3 mL Intravenous Q12H   terazosin  5 mg Oral BID   thiamine  100 mg Oral Daily   Continuous Infusions:  lactated ringers 75 mL/hr at 09/08/19 1856   piperacillin-tazobactam (ZOSYN)  IV 3.375 g (09/09/19 1354)     LOS: 2 days   Time spent: 25 minutes.  Patrecia Pour, MD [facility] [email or web address] 09/09/2019, 3:25 PM

## 2019-09-09 NOTE — Progress Notes (Signed)
Inpatient Diabetes Program Recommendations  AACE/ADA: New Consensus Statement on Inpatient Glycemic Control (2015)  Target Ranges:  Prepandial:   less than 140 mg/dL      Peak postprandial:   less than 180 mg/dL (1-2 hours)      Critically ill patients:  140 - 180 mg/dL   Lab Results  Component Value Date   GLUCAP 95 09/09/2019   HGBA1C 6.7 (H) 09/07/2019    Review of Glycemic Control Results for [name], [name] (MRN [id number]) as of 09/09/2019 09:54  Ref. Range 09/08/2019 07:55 09/08/2019 11:46 09/08/2019 16:41 09/08/2019 20:41 09/08/2019 23:53 09/09/2019 05:03 09/09/2019 07:52 09/09/2019 08:38  Glucose-Capillary Latest Ref Range: 70 - 99 mg/dL 96 116 (H) 164 (H) 101 (H) 73 84 68 (L) 95   Diabetes history: DM 2 Outpatient Diabetes medications: Lantus 20 units bid, Novolog 0-8 units tid Current orders for Inpatient glycemic control:  Lantus 20 units bid Novolog 0-9 units Q4 hours  A1c 6.7%  Inpatient Diabetes Program Recommendations:    Mild hypoglycemia 68 this am.  -  May consider reducing evening Lantus to 18 units.  Thanks,  Tama Headings RN, MSN, BC-ADM Inpatient Diabetes Coordinator Team Pager [phone] (8a-5p)

## 2019-09-09 NOTE — Progress Notes (Signed)
No allergic reactions noted. Patient demanded for benadryl. Vital signs taken, was within normal limits. Will pass it on to the day nurse.

## 2019-09-10 DIAGNOSIS — K5792 Diverticulitis of intestine, part unspecified, without perforation or abscess without bleeding: Secondary | ICD-10-CM

## 2019-09-10 LAB — GLUCOSE, CAPILLARY
Glucose-Capillary: 139 mg/dL — ABNORMAL HIGH (ref 70–99)
Glucose-Capillary: 84 mg/dL (ref 70–99)
Glucose-Capillary: 86 mg/dL (ref 70–99)
Glucose-Capillary: 87 mg/dL (ref 70–99)

## 2019-09-10 MED ORDER — AMOXICILLIN-POT CLAVULANATE 875-125 MG PO TABS
1.0000 | ORAL_TABLET | Freq: Once | ORAL | Status: AC
  Administered 2019-09-10: 1 via ORAL
  Filled 2019-09-10: qty 1

## 2019-09-10 MED ORDER — AMOXICILLIN-POT CLAVULANATE 875-125 MG PO TABS: 1.0000 | ORAL_TABLET | Freq: Two times a day (BID) | ORAL | 0 refills | Status: DC

## 2019-09-10 MED ORDER — NAPHAZOLINE-PHENIRAMINE 0.025-0.3 % OP SOLN
1.0000 [drp] | Freq: Four times a day (QID) | OPHTHALMIC | Status: DC | PRN
  Administered 2019-09-10: 1 [drp] via OPHTHALMIC
  Filled 2019-09-10: qty 15

## 2019-09-10 NOTE — Discharge Summary (Signed)
Physician Discharge Summary  [name]. WUJ:[id number] DOB: [birth year] DOA: 09/07/2019  [facility]  Admit date: 09/07/2019 Discharge date: 09/10/2019  Time spent: 40 minutes  Recommendations for Outpatient Follow-up:  1. Needs Chem-12 CBC 1 week 2. Recommend outpatient colonoscopy if not done previously 3. Might need PT eval for trochanteric bursitis in addition to vestibular rehab in the outpatient setting  Discharge Diagnoses:  Active Problems:   Essential hypertension, benign   Type 2 diabetes mellitus with hyperglycemia, with long-term current use of insulin (HCC)   Chest pain   Sleep apnea   Hyponatremia   Diverticulitis   Constipated   Dehydration   Constipation   Abnormal CT of the abdomen   Discharge Condition: Improved  Diet recommendation: Heart healthy  Filed Weights   09/07/19 1301  Weight: 121.1 kg    History of present illness:  69 year old white male DM TY 2 HTN right carotid stenosis DVT 2006 OSA not compliant on CPAP with prior multiple episodes of diverticulitis admitted 09/07/2019 from [place] office he was told to take Cipro Flagyl but could not take the Cipro as he developed tingling and lip burning  Hospital Course:   Acute diverticulitis: Recurrent, moderately severe based on pain and CT scan. No leukocytosis.  - No IV pain medications and is tolerating meals and diet - C transitioned from Zosyn to Augmentin on discharge given intolerance to Cipro in the outpatient setting Constipation: Needs improved control acutely and chronically - Bowel regimen to continue. Can use suppository if refractory, but would prefer to avoid if possible.  Multiple drug intolerances:  - Benadryl prn, does not currently require IV administration, monitor for true anaphylaxis  T2DM: HbA1c 6.7%, well controlled - Continue home lantus and regimen from home  Left leg tenderness, history of DVT:  - Negative venous U/S -He seems to have some  trochanteric bursitis and should receive a referral to PT on discharge at facility  Chest pain: Resolved. Troponin wnl, no ischemic ECG findings. No wall motion abnormalities on echo.  OSA:  - CPAP  HTN:  - Continue medications as ordered.   Hyponatremia: Resolved.  Colonic narrowing: Discussed with patient, suspect this could be stricture related to recurrent bouts of diverticulitis. NEEDS to have colonoscopy following recovery after discharge.   Obesity: Estimated body mass index is 34.28 kg/m as calculated from the following:   Height as of this encounter: 6\' 2"  (1.88 m).   Weight as of this encounter: 121.1 kg.   Discharge Exam: Vitals:   09/09/19 2044 09/10/19 0447  BP: (!) 152/64 135/66  Pulse: 67 60  Resp: 18 16  Temp: 99.1 F (37.3 C) 99.8 F (37.7 C)  SpO2: 98% 98%    General: Awake coherent no distress Cardiovascular: S1-S2 no murmur Respiratory: Chest clear Abdomen soft no rebound no guarding  Discharge Instructions   Discharge Instructions    Diet - low sodium heart healthy   Complete by: As directed    Discharge instructions   Complete by: As directed    Complete antibiotics going forward You may [facility]-you did have some dizziness on moving around and we will get therapy to see you Please follow-up with your primary care physician at the [facility] in addition in 1 to 2 weeks and get labs at that time   Increase activity slowly   Complete by: As directed      Allergies as of 09/10/2019      Reactions  Ace Inhibitors    Angioedema per pt report to admiting MD On 07/31/2019    Peanuts [peanut Oil] Shortness Of Breath   Swelling throat, sob    Tamiflu [oseltamivir Phosphate] Shortness Of Breath   Gabapentin Swelling   Lyrica [pregabalin] Swelling   Statins Other (See Comments)   Leg problems.   Avelox [moxifloxacin Hcl In Nacl] Rash      Medication List    STOP taking these medications   ECHINACEA PO    metroNIDAZOLE 500 MG tablet Commonly known as: FLAGYL     TAKE these medications   amLODipine 10 MG tablet Commonly known as: NORVASC Take 1 tablet (10 mg total) by mouth daily.   amoxicillin-clavulanate 875-125 MG tablet Commonly known as: AUGMENTIN Take 1 tablet by mouth 2 (two) times daily.   aspirin 81 MG chewable tablet Chew 81 mg by mouth every evening.   Azelastine HCl 137 MCG/SPRAY Soln Place 1 spray into both nostrils 2 (two) times daily.   carvedilol 12.5 MG tablet Commonly known as: COREG Take 1 tablet (12.5 mg total) by mouth 2 (two) times daily with a meal.   DIALYVITE VITAMIN D 5000 PO Take 5,000 Units by mouth every Monday, Wednesday, and Friday. Takes at night   FISH OIL PO Take 1 capsule by mouth every evening.   hydrochlorothiazide 25 MG tablet Commonly known as: HYDRODIURIL Take 25 mg by mouth every morning.   insulin aspart 100 UNIT/ML injection Commonly known as: novoLOG Inject 0-8 Units into the skin 3 (three) times daily as needed for high blood sugar.   insulin glargine 100 UNIT/ML injection Commonly known as: LANTUS Inject 20 Units into the skin 2 (two) times daily.   LACTOBACILLUS BIFIDUS PO Take 1 tablet by mouth daily.   magnesium oxide 400 MG tablet Commonly known as: MAG-OX Take 400 mg by mouth at bedtime.   multivitamin with minerals Tabs tablet Take 1 tablet by mouth every evening.   Opcon-A 0.027-0.315 % Soln Generic drug: Naphazoline-Pheniramine Apply 1 drop to eye daily as needed (dry eyes).   OVER THE COUNTER MEDICATION Take 1 capsule by mouth at bedtime. Oil of Oregano   potassium gluconate 595 (99 K) MG Tabs tablet Take 595 mg by mouth every evening.   terazosin 5 MG capsule Commonly known as: HYTRIN Take 5 mg by mouth 2 (two) times daily.   TURMERIC PO Take 1 tablet by mouth every evening.      Allergies  Allergen Reactions  . Ace Inhibitors     Angioedema per pt report to admiting MD On 07/31/2019   .  Peanuts [Peanut Oil] Shortness Of Breath    Swelling throat, sob   . Tamiflu [Oseltamivir Phosphate] Shortness Of Breath  . Gabapentin Swelling  . Lyrica [Pregabalin] Swelling  . Statins Other (See Comments)    Leg problems.  . Avelox [Moxifloxacin Hcl In Nacl] Rash      The results of significant diagnostics from this hospitalization (including imaging, microbiology, ancillary and laboratory) are listed below for reference.    Significant Diagnostic Studies: CT Abdomen Pelvis W Contrast  Result Date: 09/07/2019 CLINICAL DATA:  Severe abdominal pain. EXAM: CT ABDOMEN AND PELVIS WITH CONTRAST TECHNIQUE: Multidetector CT imaging of the abdomen and pelvis was performed using the standard protocol following bolus administration of intravenous contrast. CONTRAST:  126mL OMNIPAQUE IOHEXOL 300 MG/ML  SOLN COMPARISON:  10/09/2011. FINDINGS: Lower chest: There is a stable pulmonary nodule in the left lower lobe.The heart size is normal. Hepatobiliary: The  liver is normal. Normal gallbladder.There is no biliary ductal dilation. Pancreas: Normal contours without ductal dilatation. No peripancreatic fluid collection. Spleen: Unremarkable. Adrenals/Urinary Tract: --Adrenal glands: Unremarkable. --Right kidney/ureter: No hydronephrosis or radiopaque kidney stones. --Left kidney/ureter: No hydronephrosis or radiopaque kidney stones. --Urinary bladder: Unremarkable. Stomach/Bowel: --Stomach/Duodenum: No hiatal hernia or other gastric abnormality. Normal duodenal course and caliber. --Small bowel: Unremarkable. --Colon: There is a large amount of stool throughout the colon. There are findings concerning for uncomplicated sigmoid diverticulitis. There is a focal narrowing of the distal descending colon/proximal sigmoid colon. --Appendix: Normal. Vascular/Lymphatic: Atherosclerotic calcification is present within the non-aneurysmal abdominal aorta, without hemodynamically significant stenosis. 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 --No retroperitoneal lymphadenopathy. --No mesenteric lymphadenopathy. --No pelvic or inguinal lymphadenopathy. Reproductive: Unremarkable Other: No ascites or free air. There are bilateral fat containing inguinal hernias, right greater than left. Musculoskeletal. No acute displaced fractures. IMPRESSION: 1. Findings concerning for uncomplicated sigmoid diverticulitis. 2. Large amount of stool throughout the colon. 3. There is a focal narrowing of the distal descending colon/proximal sigmoid colon. Correlation with outpatient colonoscopy is recommended. 4. 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 5. Bilateral fat containing inguinal hernias, right greater than left. Aortic Atherosclerosis (ICD10-I70.0). Electronically Signed   By: Constance Holster M.D.   On: 09/07/2019 18:56   DG CHEST PORT 1 VIEW  Result Date: 09/07/2019 CLINICAL DATA:  Chills, chest pain, previous tobacco abuse, severe abdominal pain EXAM: PORTABLE CHEST 1 VIEW COMPARISON:  07/30/2019 FINDINGS: Two frontal views of the chest are obtained. Cardiac silhouette is unremarkable. No airspace disease, effusion, or pneumothorax. No acute bony abnormalities. IMPRESSION: 1. No acute intrathoracic process. Electronically Signed   By: Randa Ngo M.D.   On: 09/07/2019 21:05   ECHOCARDIOGRAM COMPLETE  Result Date: 09/08/2019    ECHOCARDIOGRAM REPORT   Patient Name:   [name]. Date of Exam: 09/08/2019 Medical Rec #:  [id number]           Height:       74.0 in Accession #:    [id number]          Weight:       267.0 lb Date of Birth:  1950/03/29            BSA:          2.457 m Patient Age:    15 years            BP:           144/68 mmHg Patient Gender: M                   HR:           59 bpm. Exam Location:  Inpatient Procedure: 2D Echo, Cardiac Doppler and Color Doppler Indications:    Chest pain, abdominal pain  History:        Patient has prior history of Echocardiogram  examinations, most                 recent 12/25/2012. PVD, Signs/Symptoms:Chest Pain; Risk                 Factors:Diabetes, Hypertension, Dyslipidemia and Obesity.  Sonographer:    Dustin Flock Referring Phys: [place]  1. Left ventricular ejection fraction, by estimation, is 60 to 65%. The left ventricle has normal function. The left ventricle has no regional wall motion abnormalities. Left ventricular diastolic parameters are consistent with Grade  I diastolic dysfunction (impaired relaxation). Elevated left atrial pressure.  2. Right ventricular systolic function is normal. The right ventricular size is mildly enlarged. Tricuspid regurgitation signal is inadequate for assessing PA pressure.  3. Left atrial size was mildly dilated.  4. The mitral valve is normal in structure. Trivial mitral valve regurgitation. No evidence of mitral stenosis.  5. The aortic valve has an indeterminant number of cusps. Aortic valve regurgitation is not visualized. Mild aortic valve stenosis.  6. The inferior vena cava is dilated in size with >50% respiratory variability, suggesting right atrial pressure of 8 mmHg. FINDINGS  Left Ventricle: Left ventricular ejection fraction, by estimation, is 60 to 65%. The left ventricle has normal function. The left ventricle has no regional wall motion abnormalities. The left ventricular internal cavity size was normal in size. There is  no left ventricular hypertrophy. Left ventricular diastolic parameters are consistent with Grade I diastolic dysfunction (impaired relaxation). Elevated left atrial pressure. Right Ventricle: The right ventricular size is mildly enlarged.Right ventricular systolic function is normal. Tricuspid regurgitation signal is inadequate for assessing PA pressure. The tricuspid regurgitant velocity is 2.55 m/s, and with an assumed right atrial pressure of 8 mmHg, the estimated right ventricular systolic pressure is 16.1 mmHg. Left Atrium:  Left atrial size was mildly dilated. Right Atrium: Right atrial size was normal in size. Pericardium: There is no evidence of pericardial effusion. Mitral Valve: The mitral valve is normal in structure. Normal mobility of the mitral valve leaflets. Moderate mitral annular calcification. Trivial mitral valve regurgitation. No evidence of mitral valve stenosis. Tricuspid Valve: The tricuspid valve is normal in structure. Tricuspid valve regurgitation is trivial. No evidence of tricuspid stenosis. Aortic Valve: The aortic valve has an indeterminant number of cusps. Aortic valve regurgitation is not visualized. Mild aortic stenosis is present. Aortic valve mean gradient measures 12.0 mmHg. Aortic valve peak gradient measures 20.3 mmHg. Aortic valve  area, by VTI measures 2.68 cm. Pulmonic Valve: The pulmonic valve was not well visualized. Pulmonic valve regurgitation is not visualized. No evidence of pulmonic stenosis. Aorta: The aortic root is normal in size and structure. Venous: The inferior vena cava is dilated in size with greater than 50% respiratory variability, suggesting right atrial pressure of 8 mmHg.  LEFT VENTRICLE PLAX 2D LVIDd:         5.10 cm  Diastology LVIDs:         2.90 cm  LV e' lateral:   6.31 cm/s LV PW:         1.30 cm  LV E/e' lateral: 22.3 LV IVS:        1.10 cm  LV e' medial:    6.85 cm/s LVOT diam:     2.30 cm  LV E/e' medial:  20.6 LV SV:         127 LV SV Index:   52 LVOT Area:     4.15 cm  RIGHT VENTRICLE RV Basal diam:  4.30 cm RV S prime:     12.80 cm/s TAPSE (M-mode): 2.9 cm LEFT ATRIUM             Index       RIGHT ATRIUM           Index LA diam:        5.20 cm 2.12 cm/m  RA Area:     23.40 cm LA Vol (A2C):   96.7 ml 39.35 ml/m RA Volume:   80.90 ml  32.92 ml/m LA Vol (A4C):   82.7  ml 33.65 ml/m LA Biplane Vol: 92.8 ml 37.76 ml/m  AORTIC VALVE AV Area (Vmax):    2.73 cm AV Area (Vmean):   2.72 cm AV Area (VTI):     2.68 cm AV Vmax:           225.50 cm/s AV Vmean:           159.000 cm/s AV VTI:            0.475 m AV Peak Grad:      20.3 mmHg AV Mean Grad:      12.0 mmHg LVOT Vmax:         148.00 cm/s LVOT Vmean:        104.000 cm/s LVOT VTI:          0.306 m LVOT/AV VTI ratio: 0.64  AORTA Ao Root diam: 2.40 cm MITRAL VALVE                TRICUSPID VALVE MV Area (PHT): 2.50 cm     TR Peak grad:   26.0 mmHg MV Decel Time: 303 msec     TR Vmax:        255.00 cm/s MV E velocity: 141.00 cm/s MV A velocity: 158.00 cm/s  SHUNTS MV E/A ratio:  0.89         Systemic VTI:  0.31 m                             Systemic Diam: 2.30 cm Kirk Ruths MD Electronically signed by Kirk Ruths MD Signature Date/Time: 09/08/2019/4:38:02 PM    Final    VAS [place] LOWER EXTREMITY VENOUS (DVT)  Result Date: 09/09/2019  Lower Venous DVTStudy Indications: Pain.  Risk Factors: DVT History of DVT in LT in 2006. Comparison Study: No prior studies. Performing Technologist: Darlin Coco  Examination Guidelines: A complete evaluation includes B-mode imaging, spectral Doppler, color Doppler, and power Doppler as needed of all accessible portions of each vessel. Bilateral testing is considered an integral part of a complete examination. Limited examinations for reoccurring indications may be performed as noted. The reflux portion of the exam is performed with the patient in reverse Trendelenburg.  +-----+---------------+---------+-----------+----------+--------------+ RIGHTCompressibilityPhasicitySpontaneityPropertiesThrombus Aging +-----+---------------+---------+-----------+----------+--------------+ CFV  Full           Yes      Yes                                 +-----+---------------+---------+-----------+----------+--------------+   +---------+---------------+---------+-----------+----------+--------------+ LEFT     CompressibilityPhasicitySpontaneityPropertiesThrombus Aging +---------+---------------+---------+-----------+----------+--------------+ CFV      Full           Yes      Yes                                  +---------+---------------+---------+-----------+----------+--------------+ SFJ      Full                                                        +---------+---------------+---------+-----------+----------+--------------+ FV Prox  Full                                                        +---------+---------------+---------+-----------+----------+--------------+  FV Mid   Full                                                        +---------+---------------+---------+-----------+----------+--------------+ FV DistalFull                                                        +---------+---------------+---------+-----------+----------+--------------+ PFV      Full                                                        +---------+---------------+---------+-----------+----------+--------------+ POP      Full           Yes      Yes                                 +---------+---------------+---------+-----------+----------+--------------+ PTV      Full                                                        +---------+---------------+---------+-----------+----------+--------------+ PERO     Full                                                        +---------+---------------+---------+-----------+----------+--------------+     Summary: RIGHT: - No evidence of common femoral vein obstruction.  LEFT: - There is no evidence of deep vein thrombosis in the lower extremity.  - No cystic structure found in the popliteal fossa.  *See table(s) above for measurements and observations. Electronically signed by Ruta Hinds MD on 09/09/2019 at 10:08:50 AM.    Final     Microbiology: Recent Results (from the past 240 hour(s))  SARS Coronavirus 2 by RT PCR (hospital order, performed in [facility] hospital lab) Nasopharyngeal Nasopharyngeal Swab     Status: None   Collection Time: 09/07/19  8:01 PM   Specimen: Nasopharyngeal Swab  Result  Value Ref Range Status   SARS Coronavirus 2 NEGATIVE NEGATIVE Final    Comment: (NOTE) SARS-CoV-2 target nucleic acids are NOT DETECTED.  The SARS-CoV-2 RNA is generally detectable in upper and lower respiratory specimens during the acute phase of infection. The lowest concentration of SARS-CoV-2 viral copies this assay can detect is 250 copies / mL. A negative result does not preclude SARS-CoV-2 infection and should not be used as the sole basis for treatment or other patient management decisions.  A negative result may occur with improper specimen collection / handling, submission of specimen other than nasopharyngeal swab, presence of viral mutation(s) within the areas targeted by this assay, and inadequate number of viral copies (<250 copies /  mL). A negative result must be combined with clinical observations, patient history, and epidemiological information.  Fact Sheet for Patients:   StrictlyIdeas.no  Fact Sheet for Healthcare Providers: BankingDealers.co.za  This test is not yet approved or  cleared by the [place] FDA and has been authorized for detection and/or diagnosis of SARS-CoV-2 by FDA under an Emergency Use Authorization (EUA).  This EUA will remain in effect (meaning this test can be used) for the duration of the COVID-19 declaration under Section 564(b)(1) of the Act, 21 U.S.C. section 360bbb-3(b)(1), unless the authorization is terminated or revoked sooner.  Performed at [facility], [place] [address] [place], [place] [address]      Labs: Basic Metabolic Panel: Recent Labs  Lab 09/07/19 1512 09/08/19 0451 09/09/19 0440  NA 132* 136 135  K 3.8 3.7 4.0  CL 93* 99 98  CO2 27 28 29   GLUCOSE 109* 102* 82  BUN 14 11 7*  CREATININE 0.87 0.79 0.88  CALCIUM 9.4 9.0 9.0  MG  --  2.1  --   PHOS  --  3.7  --    Liver Function Tests: Recent Labs  Lab 09/07/19 1512 09/08/19 0451  09/09/19 0440  AST 39 43* 32  ALT 43 48* 44  ALKPHOS 66 63 59  BILITOT 1.0 0.9 0.9  PROT 8.0 7.3 6.6  ALBUMIN 4.4 4.0 3.5   Recent Labs  Lab 09/07/19 1512  LIPASE 21   No results for input(s): AMMONIA in the last 168 hours. CBC: Recent Labs  Lab 09/07/19 1512 09/08/19 0451  WBC 9.0 8.5  NEUTROABS 6.2 5.8  HGB 15.3 14.8  HCT 45.6 44.7  MCV 87.5 87.8  PLT 290 275   Cardiac Enzymes: No results for input(s): CKTOTAL, CKMB, CKMBINDEX, TROPONINI in the last 168 hours. BNP: BNP (last 3 results) No results for input(s): BNP in the last 8760 hours.  ProBNP (last 3 results) No results for input(s): PROBNP in the last 8760 hours.  CBG: Recent Labs  Lab 09/09/19 2040 09/10/19 0011 09/10/19 0444 09/10/19 0808 09/10/19 0946  GLUCAP 149* 86 87 84 139*       Signed:  Nita Sells MD   [facility] 09/10/2019, 12:11 PM

## 2019-09-10 NOTE — TOC Progression Note (Signed)
Transition of Care (TOC) - Progression Note    Patient Details  Name: [name]. MRN: [id number] Date of Birth: [birth year]  Transition of Care [facility]) CM/SW Contact  Purcell Mouton, RN Phone Number: 09/10/2019, 12:21 PM  Clinical Narrative:     Pt use [facility], DR. Jaci Lazier, [place] office [phone] ext [phone], pager [phone].         Expected Discharge Plan and Services           Expected Discharge Date: 09/10/19                                     -Social Determinants of Health (SDOH) Interventions- ext     Readmission Risk Interventions No flowsheet data found.

## 2019-09-10 NOTE — Plan of Care (Signed)
  Problem: Activity: Goal: Risk for activity intolerance will decrease 09/10/2019 1709 by Lesle Chris, RN Outcome: Adequate for Discharge 09/10/2019 1639 by Lesle Chris, RN Outcome: Adequate for Discharge   Problem: Safety: Goal: Ability to remain free from injury will improve 09/10/2019 1709 by Lesle Chris, RN Outcome: Adequate for Discharge 09/10/2019 1639 by Lesle Chris, RN Outcome: Adequate for Discharge

## 2019-09-10 NOTE — Plan of Care (Signed)
  Problem: Activity: Goal: Risk for activity intolerance will decrease Outcome: Adequate for Discharge   Problem: Pain Managment: Goal: General experience of comfort will improve Outcome: Adequate for Discharge   Problem: Safety: Goal: Ability to remain free from injury will improve Outcome: Adequate for Discharge   

## 2019-09-10 NOTE — Evaluation (Signed)
Physical Therapy Evaluation Patient Details Name: [name]. MRN: [id number] DOB: March 13, 1950 Today's Date: 09/10/2019    History of Present Illness  69 year old male with PMH significnat for DM, HTN, right carotid stenosis, DVT 2006, OSA not compliant on CPAP, and prior multiple episodes of diverticulitis. Patient admitted 09/07/2019 from [place] office he was told to take Cipro Flagyl but could not take the Cipro as he developed tingling and lip burning. Patient reporting some new dizziness on 09/09/19 and hip pain.     Clinical Impression  [name]. is 69 y.o. male admitted with above HPI and diagnosis. Patient is currently limited by functional impairments below (see PT problem list). Patient lives with his wife and is independent at baseline. Patient is functioning/mobilizing at independent level. He is noted to have some "funny feeling" with eye movements and supine to sit/sit to stand mobility. Pt had gaze provoked Lt beat nystagmus and postural imbalance with Rhomberg test with eyes closed. Dix-Hallpike testing was negative bil. Vestibular findings indicate possibility of a unilateral vestibular hypofunction. Patient has also reported diplopia with Lt gaze. Recommend pt follow up with ENT and review symptoms with his physician. Also recommend follow up for OPPT for vestibular rehab. Thank you for your referral, Acute PT will sign off at this time.     Follow Up Recommendations Outpatient PT;Other (comment) (follow up with ENT and OPPT for vestibular rehab)    Equipment Recommendations  None recommended by PT    Recommendations for Other Services       Precautions / Restrictions Precautions Precautions: Fall Restrictions Weight Bearing Restrictions: No      Mobility  Bed Mobility Overal bed mobility: Independent         Transfers Overall transfer level: Independent Equipment used: None         Ambulation/Gait Ambulation/Gait assistance: Independent    Assistive device: None       Stairs     Wheelchair Mobility    Modified Rankin (Stroke Patients Only)      Vestibular Assessment - 09/10/19 0001      Symptom Behavior   Subjective history of current problem Patients reports a "funny felling" with getting up and moving/walking. Does not expand on symptoms. Pt does report some tinnitus and changes in hearing over the last few years. He also recently had a procedure at his ENT through the [place] and is scheduled to return for a follow up. 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    Type of Dizziness  Diplopia;Imbalance;Lightheadedness    Symptom Nature Variable;Motion provoked    Aggravating Factors Moving eyes;Turning head sideways;Sit to stand;Supine to sit    Relieving Factors Lying supine;Comments   unclear relieving factors   History of similar episodes none known.      Oculomotor Exam   Oculomotor Alignment Normal    Spontaneous Absent    Gaze-induced  Left beating nystagmus with L gaze;Left beating nystagmus with R gaze    Smooth Pursuits Intact      Positional Testing   Dix-Hallpike Dix-Hallpike Right;Dix-Hallpike Left      Dix-Hallpike Right   Dix-Hallpike Right Duration Negative    Dix-Hallpike Right Symptoms No nystagmus      Dix-Hallpike Left   Dix-Hallpike Left Duration Negative    Dix-Hallpike Left Symptoms No nystagmus      Cognition   Cognition Orientation Level Oriented x 4;Appropriate for developmental age      51  BP sitting 162/73    HR sitting 67    BP standing (after 1 minute) 141/67    HR standing (after 1 minute) 67    BP standing (after 3 minutes) 144/71    HR standing (after 3 minutes) 81    Orthostatics Comment 3 minute performed after gait.            Balance Overall balance assessment: Mild deficits observed, not formally tested Sitting-balance support: Feet supported Sitting balance-Leahy Scale: Normal     Standing balance support:  During functional activity;No upper extremity supported Standing balance-Leahy Scale: Good      High Level Balance Comments: Rhomberg EC: pt unable to maintain eyes closed due to unsteady feeling. pt with incresaed postural sway with test.              Pertinent Vitals/Pain Pain Assessment: No/denies pain    Home Living Family/patient expects to be discharged to:: Private residence Living Arrangements: Spouse/significant other;Children Available Help at Discharge: Family Type of Home: House                Prior Function Level of Independence: Independent               Hand Dominance   Dominant Hand: Right    Extremity/Trunk Assessment   Upper Extremity Assessment Upper Extremity Assessment: Overall WFL for tasks assessed    Lower Extremity Assessment Lower Extremity Assessment: Overall WFL for tasks assessed    Cervical / Trunk Assessment Cervical / Trunk Assessment: Normal  Communication   Communication: No difficulties  Cognition Arousal/Alertness: Awake/alert Behavior During Therapy: WFL for tasks assessed/performed Overall Cognitive Status: Within Functional Limits for tasks assessed                          Assessment/Plan    PT Assessment All further PT needs can be met in the next venue of care  PT Problem List Decreased activity tolerance;Decreased mobility;Decreased balance       PT Treatment Interventions      PT Goals (Current goals can be found in the Care Plan section)  Acute Rehab PT Goals Patient Stated Goal: stop feeling "Funny" headed PT Goal Formulation: With patient Time For Goal Achievement: 09/17/19 Potential to Achieve Goals: Good    Frequency  1x eval/treat    AM-PAC PT "6 Clicks" Mobility  Outcome Measure Help needed turning from your back to your side while in a flat bed without using bedrails?: None Help needed moving from lying on your back to sitting on the side of a flat bed without using bedrails?:  None Help needed moving to and from a bed to a chair (including a wheelchair)?: None Help needed standing up from a chair using your arms (e.g., wheelchair or bedside chair)?: None Help needed to walk in hospital room?: None Help needed climbing 3-5 steps with a railing? : None 6 Click Score: 24    End of Session   Activity Tolerance: Patient tolerated treatment well Patient left: in bed;with call bell/phone within reach Nurse Communication: Mobility status PT Visit Diagnosis: Unsteadiness on feet (R26.81);Dizziness and giddiness (R42);Other symptoms and signs involving the nervous system (R29.898)    Time: 7824-2353 PT Time Calculation (min) (ACUTE ONLY): 40 min   Charges:   PT Evaluation $PT Eval Low Complexity: 1 Low PT Treatments $Gait Training: 8-22 mins $Therapeutic Activity: 8-22 mins        Gwynneth Albright PT, DPT Acute Rehabilitation Services  Office [phone] Pager [phone]  09/10/2019 5:33 PM

## 2019-11-25 ENCOUNTER — Ambulatory Visit (INDEPENDENT_AMBULATORY_CARE_PROVIDER_SITE_OTHER): Payer: No Typology Code available for payment source | Admitting: Internal Medicine

## 2019-11-25 ENCOUNTER — Ambulatory Visit: Payer: No Typology Code available for payment source | Admitting: Physician Assistant

## 2019-11-25 ENCOUNTER — Encounter: Payer: Self-pay | Admitting: Internal Medicine

## 2019-11-25 VITALS — BP 132/60 | HR 60 | Ht 72.75 in | Wt 267.4 lb

## 2019-11-25 DIAGNOSIS — R933 Abnormal findings on diagnostic imaging of other parts of digestive tract: Secondary | ICD-10-CM

## 2019-11-25 DIAGNOSIS — K5732 Diverticulitis of large intestine without perforation or abscess without bleeding: Secondary | ICD-10-CM | POA: Diagnosis not present

## 2019-11-25 NOTE — Patient Instructions (Signed)
You have been scheduled for a colonoscopy. Please follow written instructions given to you at your visit today.  Please use the sample  prep supplies you have been given. If you use inhalers (even only as needed), please bring them with you on the day of your procedure.   We are going to obtain your records from [place] for review.   I appreciate the opportunity to care for you. Silvano Rusk, MD, [facility]

## 2019-11-25 NOTE — Progress Notes (Addendum)
[name]. 69 y.o. 1950/03/14 [id number]  Assessment & Plan:   Encounter Diagnoses  Name Primary?  . Abnormal CT scan, colon Yes  . Diverticulitis of colon    Plan for colonoscopy to exclude colorectal neoplasia as a cause of the focal narrowing.  More likely it was edema related to diverticulitis.  He could also have a benign stricture in this area as well.  For completeness we will request the records of his colonoscopy from [facility] in 2017 (estimated date). WAS 2014 - DIVERTICULOSIS NO POLYPS  He will continue his MiraLAX.  Procedure to be scheduled with Dr. Hilarie Fredrickson due to my injury and inability to perform procedures at this time.  The risks and benefits as well as alternatives of endoscopic procedure(s) have been discussed and reviewed. All questions answered. The patient agrees to proceed.   I appreciate the opportunity to care for this patient.   We will copy the [facility] [place], [facility], GI clinic and Dr. Adria Dill primary care  Subjective:   Chief Complaint: Diverticulitis, abnormal colon on CT scan  HPI  Patient is a 69 year old white man who was admitted to the hospital with sigmoid diverticulitis in July.  He was treated with antibiotics IV and then oral and the pain has resolved.  He had had smaller caliber bowel movements at that time and they have improved.  He is using MiraLAX daily and some Metamucil.  Reports having had a colonoscopy he thinks in 2017 at [place] that was "okay".  He was seen by the [facility] and the question was whether or not he should have a colonoscopy so he was referred back for consideration.  He says he has had diverticulitis about 10 times in 10 years left the only time he has had an admission to the hospital.  He was seen in the GI clinic at the [facility] and [facility] referred him here to be considered for the colonoscopy.  His hemoglobin and white count were normal in July when he was seen there  for follow-up.  When he was admitted he had the CT scan on September 07, 2019 at [facility] and he had changes concerning for uncomplicated sigmoid diverticulitis there was a focal narrowing of the distal descending colon proximal sigmoid colon and a colonoscopy was recommended to clarify this. 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 Ace Inhibitors     Angioedema per pt report to admiting MD On 07/31/2019   . Peanuts [Peanut Oil] Shortness Of Breath    Swelling throat, sob   . Tamiflu [Oseltamivir Phosphate] Shortness Of Breath  . Atorvastatin   . Citalopram   . Flagyl [Metronidazole]   . Gabapentin Swelling  . Lisinopril   . Losartan   . Lyrica [Pregabalin] Swelling  . Remeron [Mirtazapine]   . Simvastatin   . Statins Other (See Comments)    Leg problems.  . Victoza [Liraglutide]   . Avelox [Moxifloxacin Hcl In Nacl] Rash   Current Meds  Medication Sig  . aspirin 81 MG chewable tablet Chew 81 mg by mouth every evening.   . Azelastine HCl 137 MCG/SPRAY SOLN Place 1 spray into both nostrils 2 (two) times daily.   . carvedilol (COREG) 12.5 MG tablet Take 1 tablet (12.5 mg total) by mouth 2 (two) times daily with a meal.  .  Cholecalciferol (DIALYVITE VITAMIN D 5000 PO) Take 5,000 Units by mouth every Monday, Wednesday, and Friday. Takes at night  . fluticasone (FLONASE) 50 MCG/ACT nasal spray Place 2 sprays into both nostrils daily.  . hydrALAZINE (APRESOLINE) 10 MG tablet Take 10 mg by mouth 2 (two) times daily.  . hydrochlorothiazide (HYDRODIURIL) 25 MG tablet Take 25 mg by mouth every morning.  . hydrOXYzine (VISTARIL) 25 MG capsule Take 25 mg by mouth 2 (two) times daily as needed.  . insulin aspart (NOVOLOG) 100 UNIT/ML injection Inject 0-8 Units into the skin 3 (three) times daily as needed for high blood sugar.    . insulin glargine (LANTUS) 100 UNIT/ML injection Inject 20 Units into the skin 2 (two) times daily.   Marland Kitchen LACTOBACILLUS BIFIDUS PO Take 1 tablet by mouth daily.   . magnesium oxide (MAG-OX) 400 MG tablet Take 400 mg by mouth at bedtime.  . Multiple Vitamin (MULTIVITAMIN WITH MINERALS) TABS Take 1 tablet by mouth every evening.   . Naphazoline-Pheniramine (OPCON-A) 0.027-0.315 % SOLN Apply 1 drop to eye daily as needed (dry eyes).  . neomycin-polymyxin-hydrocortisone (CORTISPORIN) OTIC solution Place 4 drops into the right ear 4 (four) times daily.  Marland Kitchen olopatadine (PATANOL) 0.1 % ophthalmic solution Place 2 drops into both eyes daily.  . Omega-3 Fatty Acids (FISH OIL PO) Take 1 capsule by mouth every evening.   Marland Kitchen OVER THE COUNTER MEDICATION Take 1 capsule by mouth at bedtime. Oil of Oregano  . potassium gluconate 595 MG TABS Take 595 mg by mouth every evening.   . pravastatin (PRAVACHOL) 20 MG tablet Take 20 mg by mouth daily.  . tadalafil (CIALIS) 20 MG tablet Take 20 mg by mouth daily as needed for erectile dysfunction.  . TURMERIC PO Take 1 tablet by mouth every evening.    Past Medical History:  Diagnosis Date  . Cataract   . Clotting disorder ([place])    DVT in 2006 x1; no problems since  . Diabetes mellitus   . Diverticulitis   . Diverticulosis   . DVT (deep venous thrombosis) ([place]) 2007   left leg  . HLD (hyperlipidemia)   . Hypertension   . Peripheral vascular disease ([place]) 2006   dvt 's  . Pneumonia 06  . Sleep apnea    cpap started 2 months ago set on 4  . Subdural hematoma [facility])    Past Surgical History:  Procedure Laterality Date  . BRAIN SURGERY  2010   subdural hematoma -bleed  . CATARACT EXTRACTION, BILATERAL  12/16, 2017   cataract  . CYSTOSCOPY WITH INSERTION OF UROLIFT    . DG THUMB LEFT HAND Left    "gamers "  . ROTATOR CUFF REPAIR W/ DISTAL CLAVICLE EXCISION Left 05/20/2015  . SHOULDER ARTHROSCOPY WITH SUBACROMIAL DECOMPRESSION, ROTATOR CUFF REPAIR AND BICEP  TENDON REPAIR Left 05/20/2015   Procedure: LEFT SHOULDER ARTHROSCOPY WITH SUBACROMIAL DECOMPRESSION,DISTAL CLAVICAL RESECTION  ROTATOR CUFF REPAIR ;  Surgeon: Justice Britain, MD;  Location: [place];  Service: Orthopedics;  Laterality: Left;  . 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   Review of Systems Having balance and gait issues says he may have vestibular problems leading to see ENT some anxiety also sleep apnea otherwise review of systems negative or as per HPI  Objective:   Physical Exam BP 132/60 (BP Location: Right Arm, Patient Position: Sitting, Cuff Size: Normal)   Pulse 60   Ht 6' 0.75" (1.848 m) Comment: height measured without shoes  Wt 267 lb 6 oz (121.3 kg)   BMI 35.52 kg/m   Obese pleasant white man in no acute distress Eyes are anicteric Lungs cta Cor nl abd obese nt, there is a quarter sized ecchymosis in the right lower quadrant he does have fat-containing inguinal hernias bilateral, bowel sounds are present no other organomegaly or mass the size of his abdomen reduces sensitivity of exam.   He is alert and oriented x3 He has an appropriate mood and affect

## 2019-12-16 ENCOUNTER — Encounter: Payer: Self-pay | Admitting: Internal Medicine

## 2019-12-19 ENCOUNTER — Encounter: Payer: No Typology Code available for payment source | Admitting: Internal Medicine

## 2020-01-26 ENCOUNTER — Encounter: Payer: Self-pay | Admitting: Neurology

## 2020-02-04 ENCOUNTER — Other Ambulatory Visit: Payer: Self-pay

## 2020-02-04 DIAGNOSIS — R202 Paresthesia of skin: Secondary | ICD-10-CM

## 2020-02-18 ENCOUNTER — Other Ambulatory Visit: Payer: Self-pay

## 2020-02-18 ENCOUNTER — Ambulatory Visit (INDEPENDENT_AMBULATORY_CARE_PROVIDER_SITE_OTHER): Payer: No Typology Code available for payment source | Admitting: Neurology

## 2020-02-18 DIAGNOSIS — R202 Paresthesia of skin: Secondary | ICD-10-CM

## 2020-02-18 DIAGNOSIS — E1142 Type 2 diabetes mellitus with diabetic polyneuropathy: Secondary | ICD-10-CM

## 2020-02-18 NOTE — Procedures (Signed)
[facility] Neurology  [address], [address]  [place], [place] [address] Tel: [phone] Fax:  [phone] Test Date:  02/18/2020  Patient: [name] DOB: [birth year] Physician: Narda Amber, DO  Sex: Male Height: 6' " Ref Phys: Debby Bud, MD  ID#: [id number]   Technician:    Patient Complaints: This is a 69 year old man with diabetes referred for evaluation of bilateral feet paresthesias.  NCV & EMG Findings: Extensive electrodiagnostic testing of the right lower extremity and additional studies of the left shows:  1. Bilateral sural and superficial peroneal sensory responses are absent. 2. Bilateral tibial and peroneal motor responses are within normal limits. 3. Bilateral tibial H reflex studies are absent. 4. Chronic motor axonal loss changes are seen affecting the muscles below the knee bilaterally, without accompanied active denervation.  Impression: The electrophysiologic findings are consistent with a chronic sensorimotor axonal polyneuropathy affecting the lower extremities.   ___________________________ Narda Amber, DO    Nerve Conduction Studies Anti Sensory Summary Table   Stim Site NR Peak (ms) Norm Peak (ms) P-T Amp (V) Norm P-T Amp  Left Sup Peroneal Anti Sensory (Ant Lat Mall)  32C  12 cm NR  <4.6  >3  Right Sup Peroneal Anti Sensory (Ant Lat Mall)  32C  12 cm NR  <4.6  >3  Left Sural Anti Sensory (Lat Mall)  32C  Calf NR  <4.6  >3  Right Sural Anti Sensory (Lat Mall)  32C  Calf NR  <4.6  >3   Motor Summary Table   Stim Site NR Onset (ms) Norm Onset (ms) O-P Amp (mV) Norm O-P Amp Site1 Site2 Delta-0 (ms) Dist (cm) Vel (m/s) Norm Vel (m/s)  Left Peroneal Motor (Ext Dig Brev)  32C  Ankle    3.9 <6.0 2.9 >2.5 B Fib Ankle 9.5 39.0 41 >40  B Fib    13.4  2.0  Poplt B Fib 2.1 9.0 43 >40  Poplt    15.5  2.0         Right Peroneal Motor (Ext Dig Brev)  32C  Ankle    2.4 <6.0 3.0 >2.5 B Fib Ankle 9.5 38.0 40 >40  B Fib    11.9  2.8   Poplt B Fib 2.0 9.0 45 >40  Poplt    13.9  2.8         Left Tibial Motor (Abd Hall Brev)  32C  Ankle    3.7 <6.0 6.8 >4 Knee Ankle 10.5 45.0 43 >40  Knee    14.2  4.5         Right Tibial Motor (Abd Hall Brev)  32C  Ankle    3.8 <6.0 7.4 >4 Knee Ankle 11.6 46.0 40 >40  Knee    15.4  5.6          H Reflex Studies   NR H-Lat (ms) Lat Norm (ms) L-R H-Lat (ms)  Left Tibial (Gastroc)  32C  NR  <35   Right Tibial (Gastroc)  32C  NR  <35    EMG   Side Muscle Ins Act Fibs Psw Fasc Number Recrt Dur Dur. Amp Amp. Poly Poly. Comment  Right AntTibialis Nml Nml Nml Nml 1- Rapid Some 1+ Some 1+ Some 1+ N/A  Right Gastroc Nml Nml Nml Nml 1- Rapid Few 1+ Few 1+ Some 1+ N/A  Right Flex Dig Long Nml Nml Nml Nml 2- Rapid Some 1+ Some 1+ Some 1+ N/A  Right RectFemoris Nml Nml Nml Nml Nml  Nml Nml Nml Nml Nml Nml Nml N/A  Right GluteusMed Nml Nml Nml Nml Nml Nml Nml Nml Nml Nml Nml Nml N/A  Right BicepsFemS Nml Nml Nml Nml Nml Nml Nml Nml Nml Nml Nml Nml N/A  Left AntTibialis Nml Nml Nml Nml 1- Rapid Some 1+ Some 1+ Some 1+ N/A  Left Gastroc Nml Nml Nml Nml 1- Rapid Few 1+ Few 1+ Few 1+ N/A  Left Flex Dig Long Nml Nml Nml Nml 2- Rapid Some 1+ Some 1+ Some 1+ N/A  Left RectFemoris Nml Nml Nml Nml Nml Nml Nml Nml Nml Nml Nml Nml N/A  Left GluteusMed Nml Nml Nml Nml Nml Nml Nml Nml Nml Nml Nml Nml N/A  Left BicepsFemS Nml Nml Nml Nml Nml Nml Nml Nml Nml Nml Nml Nml N/A      Waveforms:

## 2020-03-02 ENCOUNTER — Encounter: Payer: Self-pay | Admitting: Internal Medicine

## 2020-03-09 ENCOUNTER — Encounter: Payer: No Typology Code available for payment source | Admitting: Neurology

## 2020-04-21 ENCOUNTER — Other Ambulatory Visit: Payer: Self-pay

## 2020-04-21 ENCOUNTER — Ambulatory Visit (AMBULATORY_SURGERY_CENTER): Payer: Self-pay | Admitting: *Deleted

## 2020-04-21 VITALS — Ht 72.75 in | Wt 265.0 lb

## 2020-04-21 DIAGNOSIS — K5900 Constipation, unspecified: Secondary | ICD-10-CM

## 2020-04-21 DIAGNOSIS — K5732 Diverticulitis of large intestine without perforation or abscess without bleeding: Secondary | ICD-10-CM

## 2020-04-21 NOTE — Progress Notes (Addendum)
No egg or soy allergy known to patient  No issues with past sedation with any surgeries or procedures No intubation problems in the past  No FH of Malignant Hyperthermia No diet pills per patient No home 02 use per patient  No blood thinners per patient  Pt denies issues with constipation  No A fib or A flutter  EMMI video to pt or via [place] 19 guidelines implemented in [place] today with Pt and RN  Pt is fully vaccinated  for Covid  Pt denies loose or missing teeth, denies dentures, partials, dental implants, capped or bonded teeth   Patient had sutab sample from a previously cancelled colonoscopy.  Patient takes daily Miralax and psyllium , therefore did not put him on 2 day prep. Patient understands to hold psyllium for 5 days. Due to the COVID-19 pandemic we are asking patients to follow certain guidelines.  Pt aware of COVID protocols and [facility] guidelines

## 2020-05-05 ENCOUNTER — Other Ambulatory Visit: Payer: Self-pay

## 2020-05-05 ENCOUNTER — Encounter: Payer: Self-pay | Admitting: Internal Medicine

## 2020-05-05 ENCOUNTER — Ambulatory Visit (AMBULATORY_SURGERY_CENTER): Payer: No Typology Code available for payment source | Admitting: Internal Medicine

## 2020-05-05 VITALS — BP 160/90 | HR 65 | Temp 97.1°F | Resp 13 | Ht 72.0 in | Wt 265.0 lb

## 2020-05-05 DIAGNOSIS — K648 Other hemorrhoids: Secondary | ICD-10-CM

## 2020-05-05 DIAGNOSIS — K5732 Diverticulitis of large intestine without perforation or abscess without bleeding: Secondary | ICD-10-CM | POA: Diagnosis not present

## 2020-05-05 DIAGNOSIS — K5520 Angiodysplasia of colon without hemorrhage: Secondary | ICD-10-CM

## 2020-05-05 DIAGNOSIS — D123 Benign neoplasm of transverse colon: Secondary | ICD-10-CM | POA: Diagnosis not present

## 2020-05-05 DIAGNOSIS — K5900 Constipation, unspecified: Secondary | ICD-10-CM

## 2020-05-05 MED ORDER — SODIUM CHLORIDE 0.9 % IV SOLN: 500.0000 mL | Freq: Once | INTRAVENOUS | Status: DC

## 2020-05-05 NOTE — Patient Instructions (Addendum)
I found and removed 3 small polyps. You do have severe diverticulosis and a stricture or narrow area because of that. Surgery would fix this but is optional.  As far as diet - there is no proof that not eating certain things reduces diverticulitis.  You are obese and the best thing you could do for your health would be to lose your belly fat. I am providing some information to you that can point you in the right direction and you can eat plenty of hamburger but not bread (or other carbs to excess). Carbs and sugar (leading to increased insulin) are the enemy. Please give this some consideration. Do not do fasting without checking with your MD that manages diabetes.  I will let you know pathology results and when to have another routine colonoscopy by mail and/or My Chart.  I appreciate the opportunity to care for you. Gatha Mayer, MD, [facility]   Information on polyps, diverticulosis and hemorrhoids given to you today. Await pathology results.  YOU HAD AN ENDOSCOPIC PROCEDURE TODAY AT [facility]:   Refer to the procedure report that was given to you for any specific questions about what was found during the examination.  If the procedure report does not answer your questions, please call your gastroenterologist to clarify.  If you requested that your care partner not be given the details of your procedure findings, then the procedure report has been included in a sealed envelope for you to review at your convenience later.  YOU SHOULD EXPECT: Some feelings of bloating in the abdomen. Passage of more gas than usual.  Walking can help get rid of the air that was put into your GI tract during the procedure and reduce the bloating. If you had a lower endoscopy (such as a colonoscopy or flexible sigmoidoscopy) you may notice spotting of blood in your stool or on the toilet paper. If you underwent a bowel prep for your procedure, you may not have a normal bowel movement for a few  days.  Please Note:  You might notice some irritation and congestion in your nose or some drainage.  This is from the oxygen used during your procedure.  There is no need for concern and it should clear up in a day or so.  SYMPTOMS TO REPORT IMMEDIATELY:   Following lower endoscopy (colonoscopy or flexible sigmoidoscopy):  Excessive amounts of blood in the stool  Significant tenderness or worsening of abdominal pains  Swelling of the abdomen that is new, acute  Fever of 100F or higher    For urgent or emergent issues, a gastroenterologist can be reached at any hour by calling [phone]. Do not use MyChart messaging for urgent concerns.    DIET:  We do recommend a small meal at first, but then you may proceed to your regular diet.  Drink plenty of fluids but you should avoid alcoholic beverages for 24 hours.  ACTIVITY:  You should plan to take it easy for the rest of today and you should NOT DRIVE or use heavy machinery until tomorrow (because of the sedation medicines used during the test).    FOLLOW UP: Our staff will call the number listed on your records 48-72 hours following your procedure to check on you and address any questions or concerns that you may have regarding the information given to you following your procedure. If we do not reach you, we will leave a message.  We will attempt to reach you two times.  During this call, we will ask if you have developed any symptoms of COVID 19. If you develop any symptoms (ie: fever, flu-like symptoms, shortness of breath, cough etc.) before then, please call [phone].  If you test positive for Covid 19 in the 2 weeks post procedure, please call and report this information to [place].    If any biopsies were taken you will be contacted by phone or by letter within the next 1-3 weeks.  Please call us at [phone] if you have not heard about the biopsies in 3 weeks.    SIGNATURES/CONFIDENTIALITY: You and/or your care partner  have signed paperwork which will be entered into your electronic medical record.  These signatures attest to the fact that that the information above on your After Visit Summary has been reviewed and is understood.  Full responsibility of the confidentiality of this discharge information lies with you and/or your care-partner.

## 2020-05-05 NOTE — Op Note (Signed)
[place] Patient Name: [name] Procedure Date: 05/05/2020 11:08 AM MRN: [id number] Endoscopist: Gatha Mayer , MD Age: 70 Referring MD:  Date of Birth: October 27, 1950 Gender: Male Account #: [id number] Procedure:                Colonoscopy Indications:              Abnormal CT of the GI tract, Diverticulitis,                            Follow-up of diverticulitis Medicines:                Propofol per Anesthesia, Monitored Anesthesia Care Procedure:                Pre-Anesthesia Assessment:                           - Prior to the procedure, a History and Physical                            was performed, and patient medications and                            allergies were reviewed. The patient's tolerance of                            previous anesthesia was also reviewed. The risks                            and benefits of the procedure and the sedation                            options and risks were discussed with the patient.                            All questions were answered, and informed consent                            was obtained. Prior Anticoagulants: The patient has                            taken no previous anticoagulant or antiplatelet                            agents. ASA Grade Assessment: III - A patient with                            severe systemic disease. After reviewing the risks                            and benefits, the patient was deemed in                            satisfactory condition to undergo the procedure.  After obtaining informed consent, the colonoscope                            was passed under direct vision. Throughout the                            procedure, the patient's blood pressure, pulse, and                            oxygen saturations were monitored continuously. The                            Olympus CF-[id number]) Colonoscope was                            introduced through  the anus and advanced to the the                            terminal ileum, with identification of the                            appendiceal orifice and IC valve. The patient                            tolerated the procedure well. The colonoscopy was                            somewhat difficult due to a tortuous colon. Scope In: 11:23:35 AM Scope Out: 11:43:09 AM Scope Withdrawal Time: 0 hours 12 minutes 23 seconds  Total Procedure Duration: 0 hours 19 minutes 34 seconds  Findings:                 The perianal and digital rectal examinations were                            normal. Pertinent negatives include normal prostate                            (size, shape, and consistency).                           Three sessile polyps were found in the transverse                            colon. The polyps were diminutive in size. These                            polyps were removed with a cold snare. Resection                            and retrieval were complete. Verification of                            patient identification for the specimen was done.  Estimated blood loss was minimal.                           Many small and large-mouthed diverticula were found                            in the sigmoid colon. There was narrowing of the                            colon in association with the diverticular opening.                           External and internal hemorrhoids were found.                           The exam was otherwise without abnormality on                            direct and retroflexion views. Complications:            No immediate complications. Estimated Blood Loss:     Estimated blood loss was minimal. Impression:               - Three diminutive polyps in the transverse colon,                            removed with a cold snare. Resected and retrieved.                           - Severe diverticulosis in the sigmoid colon. There                             was narrowing of the colon in association with the                            diverticular opening. Stenosis/soft stricture                           - External and internal hemorrhoids.                           - The examination was otherwise normal on direct                            and retroflexion views. Recommendation:           - Patient has a contact number available for                            emergencies. The signs and symptoms of potential                            delayed complications were discussed with the                            patient.  Return to normal activities tomorrow.                            Written discharge instructions were provided to the                            patient.                           - Continue present medications.                           - Repeat colonoscopy is recommended. The                            colonoscopy date will be determined after pathology                            results from today's exam become available for                            review.                           - Am recommending low carb dietto lose weight -                            fasting may be an option but needs to be                            coordinated with physician as he is on insulin Gatha Mayer, MD 05/05/2020 11:59:34 AM This report has been signed electronically.

## 2020-05-05 NOTE — Progress Notes (Signed)
Last used marijuana yesterday.

## 2020-05-05 NOTE — Progress Notes (Signed)
Pt's states no medical or surgical changes since previsit or office visit.  CW - vitals 

## 2020-05-05 NOTE — Progress Notes (Signed)
To PACU, VSS. Report to RN.tb 

## 2020-05-05 NOTE — Progress Notes (Signed)
Called to room to assist during endoscopic procedure.  Patient ID and intended procedure confirmed with present staff. Received instructions for my participation in the procedure from the performing physician.  

## 2020-05-07 ENCOUNTER — Telehealth: Payer: Self-pay

## 2020-05-07 NOTE — Telephone Encounter (Signed)
  Follow up Call-  Call back number 05/05/2020  Post procedure Call Back phone  # [phone]  Permission to leave phone message Yes  Some recent data might be hidden     Patient questions:  Do you have a fever, pain , or abdominal swelling? No. Pain Score  0 *  Have you tolerated food without any problems? Yes.    Have you been able to return to your normal activities? Yes.    Do you have any questions about your discharge instructions: Diet   No. Medications  No. Follow up visit  No.  Do you have questions or concerns about your Care? No.  Actions: * If pain score is 4 or above: No action needed, pain <4. 1. Have you developed a fever since your procedure? no  2.   Have you had an respiratory symptoms (SOB or cough) since your procedure? no  3.   Have you tested positive for COVID 19 since your procedure no  4.   Have you had any family members/close contacts diagnosed with the COVID 19 since your procedure?  no   If yes to any of these questions please route to Joylene John, RN and Joella Prince, RN

## 2020-05-07 NOTE — Telephone Encounter (Signed)
Attempted to reach pt. With follow-up call following endoscopic procedure 05/05/20.  LM on pt. Voice mail.  Will try to reach pt. Again later today.

## 2020-05-18 ENCOUNTER — Encounter: Payer: Self-pay | Admitting: Internal Medicine

## 2020-07-29 ENCOUNTER — Encounter (HOSPITAL_COMMUNITY): Payer: Self-pay

## 2020-07-29 ENCOUNTER — Emergency Department (HOSPITAL_COMMUNITY): Payer: No Typology Code available for payment source

## 2020-07-29 ENCOUNTER — Observation Stay (HOSPITAL_COMMUNITY)
Admission: EM | Admit: 2020-07-29 | Discharge: 2020-07-31 | Disposition: A | Discharge status: Home / Self Care | Payer: No Typology Code available for payment source | Attending: Family Medicine | Admitting: Family Medicine

## 2020-07-29 ENCOUNTER — Other Ambulatory Visit: Payer: Self-pay

## 2020-07-29 DIAGNOSIS — Z794 Long term (current) use of insulin: Secondary | ICD-10-CM | POA: Insufficient documentation

## 2020-07-29 DIAGNOSIS — R1011 Right upper quadrant pain: Principal | ICD-10-CM | POA: Insufficient documentation

## 2020-07-29 DIAGNOSIS — I10 Essential (primary) hypertension: Secondary | ICD-10-CM | POA: Insufficient documentation

## 2020-07-29 DIAGNOSIS — Z79899 Other long term (current) drug therapy: Secondary | ICD-10-CM | POA: Diagnosis not present

## 2020-07-29 DIAGNOSIS — Z91010 Allergy to peanuts: Secondary | ICD-10-CM | POA: Diagnosis not present

## 2020-07-29 DIAGNOSIS — Z20822 Contact with and (suspected) exposure to covid-19: Secondary | ICD-10-CM | POA: Insufficient documentation

## 2020-07-29 DIAGNOSIS — E871 Hypo-osmolality and hyponatremia: Secondary | ICD-10-CM | POA: Insufficient documentation

## 2020-07-29 DIAGNOSIS — E1165 Type 2 diabetes mellitus with hyperglycemia: Secondary | ICD-10-CM | POA: Diagnosis not present

## 2020-07-29 DIAGNOSIS — E861 Hypovolemia: Secondary | ICD-10-CM | POA: Insufficient documentation

## 2020-07-29 DIAGNOSIS — R1084 Generalized abdominal pain: Secondary | ICD-10-CM | POA: Diagnosis not present

## 2020-07-29 DIAGNOSIS — Z7982 Long term (current) use of aspirin: Secondary | ICD-10-CM | POA: Insufficient documentation

## 2020-07-29 DIAGNOSIS — Z87891 Personal history of nicotine dependence: Secondary | ICD-10-CM | POA: Insufficient documentation

## 2020-07-29 LAB — CBC WITH DIFFERENTIAL/PLATELET
Abs Immature Granulocytes: 0.09 10*3/uL — ABNORMAL HIGH (ref 0.00–0.07)
Basophils Absolute: 0.1 10*3/uL (ref 0.0–0.1)
Basophils Relative: 0 %
Eosinophils Absolute: 0.1 10*3/uL (ref 0.0–0.5)
Eosinophils Relative: 1 %
HCT: 44.9 % (ref 39.0–52.0)
Hemoglobin: 15.3 g/dL (ref 13.0–17.0)
Immature Granulocytes: 1 %
Lymphocytes Relative: 6 %
Lymphs Abs: 0.9 10*3/uL (ref 0.7–4.0)
MCH: 29.3 pg (ref 26.0–34.0)
MCHC: 34.1 g/dL (ref 30.0–36.0)
MCV: 86 fL (ref 80.0–100.0)
Monocytes Absolute: 0.8 10*3/uL (ref 0.1–1.0)
Monocytes Relative: 5 %
Neutro Abs: 12.9 10*3/uL — ABNORMAL HIGH (ref 1.7–7.7)
Neutrophils Relative %: 87 %
Platelets: 299 10*3/uL (ref 150–400)
RBC: 5.22 MIL/uL (ref 4.22–5.81)
RDW: 13 % (ref 11.5–15.5)
WBC: 14.9 10*3/uL — ABNORMAL HIGH (ref 4.0–10.5)
nRBC: 0 % (ref 0.0–0.2)

## 2020-07-29 LAB — COMPREHENSIVE METABOLIC PANEL
ALT: 23 U/L (ref 0–44)
AST: 32 U/L (ref 15–41)
Albumin: 3.6 g/dL (ref 3.5–5.0)
Alkaline Phosphatase: 87 U/L (ref 38–126)
Anion gap: 11 (ref 5–15)
BUN: 15 mg/dL (ref 8–23)
CO2: 27 mmol/L (ref 22–32)
Calcium: 8.9 mg/dL (ref 8.9–10.3)
Chloride: 91 mmol/L — ABNORMAL LOW (ref 98–111)
Creatinine, Ser: 0.88 mg/dL (ref 0.61–1.24)
GFR, Estimated: >60 mL/min (ref >60)
Glucose, Bld: 271 mg/dL — ABNORMAL HIGH (ref 70–99)
Potassium: 3.7 mmol/L (ref 3.5–5.1)
Sodium: 129 mmol/L — ABNORMAL LOW (ref 135–145)
Total Bilirubin: 1 mg/dL (ref 0.3–1.2)
Total Protein: 6.9 g/dL (ref 6.5–8.1)

## 2020-07-29 LAB — URINALYSIS, ROUTINE W REFLEX MICROSCOPIC
Bacteria, UA: NONE SEEN
Bilirubin Urine: NEGATIVE
Glucose, UA: >=500 mg/dL — AB
Ketones, ur: 5 mg/dL — AB
Leukocytes,Ua: NEGATIVE
Nitrite: NEGATIVE
Protein, ur: 100 mg/dL — AB
Specific Gravity, Urine: 1.011 (ref 1.005–1.030)
pH: 7 (ref 5.0–8.0)

## 2020-07-29 LAB — LIPASE, BLOOD: Lipase: 27 U/L (ref 11–51)

## 2020-07-29 LAB — LACTIC ACID, PLASMA: Lactic Acid, Venous: 1.2 mmol/L (ref 0.5–1.9)

## 2020-07-29 MED ORDER — ENOXAPARIN SODIUM 40 MG/0.4ML IJ SOSY
40.0000 mg | PREFILLED_SYRINGE | Freq: Every day | INTRAMUSCULAR | Status: DC
  Administered 2020-07-30 – 2020-07-31 (×2): 40 mg via SUBCUTANEOUS
  Filled 2020-07-29 (×2): qty 0.4

## 2020-07-29 MED ORDER — INSULIN ASPART 100 UNIT/ML IJ SOLN
0.0000 [IU] | Freq: Every day | INTRAMUSCULAR | Status: DC
  Administered 2020-07-30: 2 [IU] via SUBCUTANEOUS

## 2020-07-29 MED ORDER — INSULIN ASPART 100 UNIT/ML IJ SOLN
0.0000 [IU] | Freq: Three times a day (TID) | INTRAMUSCULAR | Status: DC
  Administered 2020-07-30: 3 [IU] via SUBCUTANEOUS
  Administered 2020-07-30: 2 [IU] via SUBCUTANEOUS
  Administered 2020-07-30: 3 [IU] via SUBCUTANEOUS
  Administered 2020-07-31: 2 [IU] via SUBCUTANEOUS

## 2020-07-29 MED ORDER — ONDANSETRON HCL 4 MG/2ML IJ SOLN
4.0000 mg | Freq: Once | INTRAMUSCULAR | Status: AC
  Administered 2020-07-30: 4 mg via INTRAVENOUS
  Filled 2020-07-29: qty 2

## 2020-07-29 MED ORDER — LACTATED RINGERS IV SOLN: INTRAVENOUS | Status: DC

## 2020-07-29 MED ORDER — HYDRALAZINE HCL 10 MG PO TABS
10.0000 mg | ORAL_TABLET | Freq: Two times a day (BID) | ORAL | Status: DC
  Administered 2020-07-30 – 2020-07-31 (×4): 10 mg via ORAL
  Filled 2020-07-29 (×4): qty 1

## 2020-07-29 NOTE — ED Triage Notes (Signed)
EMS reports pt is from home. Was watching television and had a severe pain to upper abdomen in epigastric area. Reports unable to feel pedal pulses in bilateral feet. Pt has history of peripheral neuropathy. Pain currently 2/10 in abdomen. Lower extremities are warm and dry. Cap refill is < 3 sec.

## 2020-07-29 NOTE — ED Notes (Signed)
Patient transported to CT 

## 2020-07-29 NOTE — H&P (Signed)
History and Physical  [name]. TJQ:[id number] DOB: Oct 02, 1950 DOA: 07/29/2020  Referring physician: Dr. Jenean Lindau, [place] PCP: Administration, [place]  Outpatient Specialists: GI. Patient coming from: Home.  Chief Complaint: Sudden onset of right upper quadrant abdominal pain, nausea and vomiting.  HPI: [name]. is a 70 y.o. male with medical history significant for diverticulitis, last colonoscopy on 05/05/2020 by Dr. Carlean Purl, essential hypertension, hyperlipidemia, type 2 diabetes, chronic anxiety, who presented to [facility] ED due to sudden onset right upper quadrant severe pain hours prior to his presentation.  Associated with nausea and one episode of vomiting.  Denies any constitutional symptoms.  He presented to the ED for further evaluation.  CT abdomen and pelvis without contrast showed no specific evidence of inflammation of the gallbladder.  Right upper quadrant abdominal ultrasound was also equivocal.  A HIDA scan has been ordered and is pending.  TR[facility], hospitalist team, was asked to admit.  ED Course:  Temperature 97.4.  BP 162/79, pulse 67, respiratory 17, O2 saturation 97% on room air.  Lab studies remarkable for serum sodium 129, potassium 3.7, glucose 271, creatinine 0.88, lipase 27.  WBC 14.9, hemoglobin 15.3, platelet 299, neutrophil count 12.9.  UA negative for pyuria.  Review of Systems: Review of systems as noted in the HPI. All other systems reviewed and are negative.   Past Medical History:  Diagnosis Date  . Allergy   . Anxiety   . Arthritis   . Cataract    bilateral lens implants  . Clotting disorder ([place])    DVT in 2006 x1; no problems since  . Colon stricture - diverticular ([place]) 09/07/2019  . Diabetes mellitus   . Diverticulitis   . Diverticulosis   . DVT (deep venous thrombosis) ([place]) 2007   left leg  . HLD (hyperlipidemia)   . Hypertension   . Peripheral vascular disease ([place]) 2006   dvt 's  . Pneumonia 06  . Sleep apnea    cpap started 2  months ago set on 4  . 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 BRAIN SURGERY  2010   subdural hematoma -bleed  . CATARACT EXTRACTION, BILATERAL  12/16, 2017   cataract  . COLONOSCOPY  2014  . CYSTOSCOPY WITH INSERTION OF UROLIFT    . CYSTOSCOPY WITH INSERTION OF UROLIFT    . DG THUMB LEFT HAND Left    "gamers "  . ROTATOR CUFF REPAIR W/ DISTAL CLAVICLE EXCISION Left 05/20/2015  . SHOULDER ARTHROSCOPY WITH SUBACROMIAL DECOMPRESSION, ROTATOR CUFF REPAIR AND BICEP TENDON REPAIR Left 05/20/2015   Procedure: LEFT SHOULDER ARTHROSCOPY WITH SUBACROMIAL DECOMPRESSION,DISTAL CLAVICAL RESECTION  ROTATOR CUFF REPAIR ;  Surgeon: Justice Britain, MD;  Location: [place];  Service: Orthopedics;  Laterality: Left;  . TONSILLECTOMY      Social History:  reports that he quit smoking about 43 years ago. His smoking use included cigarettes. He has a 15.00 pack-year smoking history. He has never used smokeless tobacco. He reports current alcohol use of about 10.0 standard drinks of alcohol per week. He reports current drug use. Drug: Marijuana.   Allergies  Allergen Reactions  . Ace Inhibitors     Angioedema per pt report to admiting MD On 07/31/2019   . Ciprofloxacin Swelling  . Peanuts [Peanut Oil] Shortness Of Breath    Swelling throat, sob   . Tamiflu [Oseltamivir Phosphate] Shortness Of Breath  . Atorvastatin   . Citalopram   . Flagyl [Metronidazole]   .  Gabapentin Swelling  . Lisinopril   . Losartan   . Lyrica [Pregabalin] Swelling  . Remeron [Mirtazapine]   . Simvastatin   . Statins Other (See Comments)    Leg problems.  . Victoza [Liraglutide]   . Avelox [Moxifloxacin Hcl In Nacl] Rash    Family History  Problem Relation Age of Onset  . Hypertension Mother   . Alzheimer's disease Mother   . Stroke Father   . Heart attack Maternal Grandfather 50  . Cancer Paternal Grandfather        type unknown  . Diabetes Brother   . Colon polyps Neg Hx   .  Colon cancer Neg Hx   . Liver cancer Neg Hx   . Stomach cancer Neg Hx   . Rectal cancer Neg Hx       Prior to Admission medications   Medication Sig Start Date End Date Taking? Authorizing Provider  aspirin 81 MG chewable tablet Chew 81 mg by mouth every evening.     [provider]  Azelastine HCl 137 MCG/SPRAY SOLN Place 1 spray into both nostrils 2 (two) times daily.  06/20/19   [provider]  Cholecalciferol (DIALYVITE VITAMIN D 5000 PO) Take 5,000 Units by mouth every Monday, Wednesday, and Friday. Takes at night    [provider]  fluticasone (FLONASE) 50 MCG/ACT nasal spray Place 2 sprays into both nostrils daily. Patient not taking: Reported on 05/05/2020    [provider]  hydrALAZINE (APRESOLINE) 10 MG tablet Take 10 mg by mouth 2 (two) times daily.    [provider]  hydrochlorothiazide (HYDRODIURIL) 25 MG tablet Take 25 mg by mouth every morning. 11/28/17   [provider]  hydrOXYzine (VISTARIL) 25 MG capsule Take 25 mg by mouth 2 (two) times daily as needed.    [provider]  insulin aspart (NOVOLOG) 100 UNIT/ML injection Inject 0-8 Units into the skin 3 (three) times daily as needed for high blood sugar.     [provider]  insulin glargine (LANTUS) 100 UNIT/ML injection Inject 20 Units into the skin 2 (two) times daily.     [provider]  LACTOBACILLUS BIFIDUS PO Take 1 tablet by mouth daily.     [provider]  magnesium oxide (MAG-OX) 400 MG tablet Take 400 mg by mouth at bedtime.    [provider]  Multiple Vitamin (MULTIVITAMIN WITH MINERALS) TABS Take 1 tablet by mouth every evening.     [provider]  Naphazoline-Pheniramine 0.027-0.315 % SOLN Apply 1 drop to eye daily as needed (dry eyes).    [provider]  neomycin-polymyxin-hydrocortisone (CORTISPORIN) OTIC solution Place 4 drops into the right ear 4 (four) times daily.    [provider]  olopatadine (PATANOL) 0.1 % ophthalmic solution Place 2 drops into both eyes daily.    [provider]  Omega-3 Fatty Acids (FISH OIL PO) Take 1 capsule by mouth every evening.     [provider]  OVER THE COUNTER MEDICATION Take 1 capsule by mouth at bedtime. Oil of [place]    [provider]  potassium gluconate 595 MG TABS Take 595 mg by mouth every evening.     [provider]  pravastatin (PRAVACHOL) 20 MG tablet Take 20 mg by mouth daily. Patient not taking: Reported on 05/05/2020    [provider]  tadalafil (CIALIS) 20 MG tablet Take 20 mg by mouth daily as needed for erectile dysfunction.    [provider]  TURMERIC PO Take 1 tablet by mouth every evening.     [provider]    Physical Exam: BP (!) 168/76   Pulse 67   Resp 17   Ht 6\' 2"  (1.88 m)   Wt 121.1 kg   SpO2 96%   BMI 34.28 kg/m   . General: 70 y.o. year-old male well developed well nourished in no acute distress.  Alert and oriented x3. . Cardiovascular: Regular rate and rhythm with no rubs or gallops.  No thyromegaly or JVD noted.  Trace lower extremity edema.  Marland Kitchen Respiratory: Clear to auscultation with no wheezes or rales. Good inspiratory effort. . Abdomen: Soft , obese, tenderness with palpation of right upper quadrant abdomen. Nondistended with normal bowel sounds x4 quadrants. . Muskuloskeletal: No cyanosis or clubbing.  Trace lower extremity edema bilaterally.   . Neuro: CN II-XII intact, strength, sensation, reflexes . Skin: No ulcerative lesions noted or rashes . Psychiatry: Judgement and insight appear normal. Mood is appropriate for condition and setting          Labs on Admission:  Basic Metabolic Panel: Recent Labs  Lab 07/29/20 2045  NA 129*  K 3.7  CL 91*  CO2 27  GLUCOSE 271*  BUN 15  CREATININE 0.88  CALCIUM 8.9   Liver Function Tests: Recent Labs  Lab 07/29/20 2045  AST 32  ALT 23  ALKPHOS 87   BILITOT 1.0  PROT 6.9  ALBUMIN 3.6   Recent Labs  Lab 07/29/20 2045  LIPASE 27   No results for input(s): AMMONIA in the last 168 hours. CBC: Recent Labs  Lab 07/29/20 2045  WBC 14.9*  NEUTROABS 12.9*  HGB 15.3  HCT 44.9  MCV 86.0  PLT 299   Cardiac Enzymes: No results for input(s): CKTOTAL, CKMB, CKMBINDEX, TROPONINI in the last 168 hours.  BNP (last 3 results) No results for input(s): BNP in the last 8760 hours.  ProBNP (last 3 results) No results for input(s): PROBNP in the last 8760 hours.  CBG: No results for input(s): GLUCAP in the last 168 hours.  Radiological Exams on Admission: CT ABDOMEN PELVIS WO CONTRAST  Result Date: 07/29/2020 CLINICAL DATA:  Acute onset of right-sided abdominal pain. Kidney stone or appendicitis suspected EXAM: CT ABDOMEN AND PELVIS WITHOUT CONTRAST TECHNIQUE: Multidetector CT imaging of the abdomen and pelvis was performed following the standard protocol without IV contrast. COMPARISON:  Abdominopelvic CT 09/07/2019 FINDINGS: Lower chest: Mild lower lobe atelectasis. Minimal bronchiectasis in the right lower lobe. Coronary artery calcifications. Mitral annulus calcifications. Normal heart size. Stable 4 mm left lower lobe pulmonary nodule, series 5, image 14. This is been unchanged since 2013 CT and is consistent with benign process. Hepatobiliary: No focal hepatic abnormality on noncontrast exam. Gallbladder is distended with slight indistinctness of gallbladder wall, series 3, image 36. No calcified gallstone. There is no biliary dilatation. Pancreas: No ductal dilatation or inflammation. Spleen: Normal in size without focal abnormality. Adrenals/Urinary Tract: Mild adrenal thickening without dominant adrenal nodule. Symmetric perinephric edema which is unchanged from prior exam. No renal or ureteral stones. No evidence of focal renal lesion. Partially distended urinary bladder without wall thickening or stone. Stomach/Bowel: Normal appendix,  series 3, image 53. The stomach is decompressed. Small duodenal diverticulum. There is no small bowel obstruction or inflammation. Few fluid-filled loops of small bowel in the pelvis without wall thickening or inflammation. No terminal ileal inflammation. Small to moderate colonic stool burden. Descending and sigmoid colonic diverticulosis. No diverticulitis or colonic inflammation.  Sigmoid colon is redundant. Vascular/Lymphatic: Moderate aortic atherosclerosis. No aortic aneurysm. Few scattered periportal nodes are likely reactive. No enlarged lymph nodes in the abdomen or pelvis. Reproductive: Normal sized prostate gland.  Urolith. Other: No free air. No ascites. Tiny fat containing umbilical hernia. Fat within both inguinal canals, right greater than left. Musculoskeletal: Multilevel degenerative change throughout the spine. Mild degenerative change of both hips. There are no acute or suspicious osseous abnormalities. IMPRESSION: 1. Distended gallbladder with slight indistinctness of gallbladder wall, suspicious for acute cholecystitis. Recommend right upper quadrant ultrasound for further evaluation. 2. No renal stones or obstructive uropathy. Normal appendix. 3. Colonic diverticulosis without diverticulitis. Aortic Atherosclerosis (ICD10-I70.0). Electronically Signed   By: Keith Rake M.D.   On: 07/29/2020 21:52   US Abdomen Limited  Result Date: 07/29/2020 CLINICAL DATA:  Right-sided abdominal pain EXAM: ULTRASOUND ABDOMEN LIMITED RIGHT UPPER QUADRANT COMPARISON:  07/29/2020 FINDINGS: Gallbladder: No gallstones or biliary sludge. Mild gallbladder wall thickening measuring 4 mm, nonspecific. No pericholecystic fluid. Negative sonographic Murphy sign. Common bile duct: Diameter: 4 mm Liver: No focal lesion identified. Within normal limits in parenchymal echogenicity. Portal vein is patent on color Doppler imaging with normal direction of blood flow towards the liver. Other: None. IMPRESSION: 1.  Nonspecific mild gallbladder wall thickening measuring 4 mm. No evidence of cholelithiasis or gallbladder sludge. If acute acalculous cholecystitis is suspected, follow-up nuclear medicine hepatobiliary scan could be considered. 2. Otherwise unremarkable right upper quadrant ultrasound. Electronically Signed   By: Randa Ngo M.D.   On: 07/29/2020 22:51    EKG: I independently viewed the EKG done and my findings are as followed: Sinus rhythm rate of 78. nonspecific ST-T changes.  QTc 483.  Assessment/Plan Present on Admission: . RUQ abdominal pain  Active Problems:   RUQ abdominal pain  Right upper quadrant abdominal pain, rule out acute cholecystitis. CT abdomen and pelvis and right upper quadrant abdominal ultrasound equivocal. HIDA scan ordered and pending.  N.p.o. until the scan is completed. Start IV antibiotics, Zosyn, empirically until acute cholecystitis is ruled out. Follow cultures. Start gentle IV fluid hydration  Hypovolemic hyponatremia, suspect contributed by home HCTZ. Presented with serum sodium of 129 Hold off home HCTZ for now. Continue gentle IV fluid hydration Repeat BMP in the morning.  Type 2 diabetes with hyperglycemia Obtain hemoglobin A1c Hold off home oral hypoglycemics Start insulin sliding scale. Start heart healthy carb modified diet once HIDA scan is completed.  Essential hypertension BP is not at goal, elevated. Resume home hydralazine, and Coreg. Hold off home HCTZ due to hyponatremia. Monitor vital signs IV antihypertensives as needed with parameters  Generalized anxiety Resume home hydroxyzine as needed   DVT prophylaxis: Subcu Lovenox daily.  Code Status: Full code.  Family Communication: None at bedside.  Disposition Plan: Admit to MedSurg unit with remote telemetry.  Consults called: None.  Admission status: Observation status.   Status is: Observation    Dispo:  Patient From: Home  Planned Disposition: Home possibly  on 07/30/2020 or when symptomatology has improved  Medically stable for discharge: No      Kayleen Memos MD [facility] Pager [phone]  If 7PM-7AM, please contact night-coverage [email or web address] Password [email or web address]  07/29/2020, 11:33 PM

## 2020-07-29 NOTE — ED Provider Notes (Signed)
[facility] EMERGENCY DEPARTMENT Provider Note   CSN: [id number] Arrival date & time: 07/29/20  2014     History Chief Complaint  Patient presents with  . Abdominal Pain    [name]. is a 70 y.o. male.  The history is provided by the patient and the EMS personnel.  Abdominal Pain Pain location:  RUQ Pain quality: gnawing and sharp   Pain radiates to:  Does not radiate Pain severity:  Severe Onset quality:  Gradual Timing:  Constant Progression:  Improving Chronicity:  New Context: eating   Context: not alcohol use, not awakening from sleep, not diet changes, not laxative use, not medication withdrawal, not previous surgeries, not recent illness, not recent sexual activity, not recent travel, not retching, not sick contacts, not suspicious food intake and not trauma   Relieved by:  Nothing Worsened by:  Palpation and movement Ineffective treatments:  None tried Associated symptoms: anorexia, nausea and vomiting   Associated symptoms: no belching, no chest pain, no chills, no constipation, no cough, no diarrhea, no dysuria, no fatigue, no fever, no flatus, no hematemesis, no hematochezia, no hematuria, no melena, no shortness of breath and no sore throat   Risk factors: obesity   Risk factors: has not had multiple surgeries        Past Medical History:  Diagnosis Date  . Allergy   . Anxiety   . Arthritis   . Cataract    bilateral lens implants  . Clotting disorder ([place])    DVT in 2006 x1; no problems since  . 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 Diabetes mellitus   . Diverticulitis   . Diverticulosis   . DVT (deep venous thrombosis) ([place]) 2007   left leg  . HLD (hyperlipidemia)   . Hypertension   . Peripheral vascular disease ([place]) 2006   dvt 's  . Pneumonia 06  . Sleep apnea    cpap started 2 months ago set on 4  . Subdural hematoma [facility])     Patient Active Problem List   Diagnosis Date Noted  . RUQ abdominal  pain 07/29/2020  . Hyponatremia 09/07/2019  . Diverticulitis 09/07/2019  . Constipated 09/07/2019  . Dehydration 09/07/2019  . Constipation 09/07/2019  . 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 Hypertensive urgency 07/31/2019  . Chest pain 07/31/2019  . Sleep apnea 07/31/2019  . S/P rotator cuff repair 05/20/2015  . Shortness of breath   . Essential hypertension, benign   . Dyspepsia   . Type 2 diabetes mellitus with hyperglycemia, with long-term current use of insulin [facility])     Past Surgical History:  Procedure Laterality Date  . BRAIN SURGERY  2010   subdural hematoma -bleed  . CATARACT EXTRACTION, BILATERAL  12/16, 2017   cataract  . COLONOSCOPY  2014  . CYSTOSCOPY WITH INSERTION OF UROLIFT    . CYSTOSCOPY WITH INSERTION OF UROLIFT    . DG THUMB LEFT HAND Left    "gamers "  . ROTATOR CUFF REPAIR W/ DISTAL CLAVICLE EXCISION Left 05/20/2015  . SHOULDER ARTHROSCOPY WITH SUBACROMIAL DECOMPRESSION, ROTATOR CUFF REPAIR AND BICEP TENDON REPAIR Left 05/20/2015   Procedure: LEFT SHOULDER ARTHROSCOPY WITH SUBACROMIAL DECOMPRESSION,DISTAL CLAVICAL RESECTION  ROTATOR CUFF REPAIR ;  Surgeon: Justice Britain, MD;  Location: [facility];  Service: Orthopedics;  Laterality: Left;  . TONSILLECTOMY         Family History  Problem Relation Age of Onset  . Hypertension Mother   . Alzheimer's  disease Mother   . Stroke Father   . Heart attack Maternal Grandfather 50  . Cancer Paternal Grandfather        type unknown  . Diabetes Brother   . Colon polyps Neg Hx   . Colon cancer Neg Hx   . Liver cancer Neg Hx   . Stomach cancer Neg Hx   . Rectal cancer Neg Hx     Social History   Tobacco Use  . Smoking status: Former Smoker    Packs/day: 1.00    Years: 15.00    Pack years: 15.00    Types: Cigarettes    Quit date: 1979    Years since quitting: 43.4  . Smokeless tobacco: Never Used  Vaping Use  . Vaping Use: Some days  . Devices: THC  Substance Use Topics  . Alcohol use:  Yes    Alcohol/week: 10.0 standard drinks    Types: 10 Cans of beer per week    Comment: 3 beers per day  . Drug use: Yes    Types: Marijuana    Comment: THC    Home Medications Prior to Admission medications   Medication Sig Start Date End Date Taking? Authorizing Provider  aspirin 81 MG chewable tablet Chew 81 mg by mouth every evening.     [provider]  Azelastine HCl 137 MCG/SPRAY SOLN Place 1 spray into both nostrils 2 (two) times daily.  06/20/19   [provider]  Cholecalciferol (DIALYVITE VITAMIN D 5000 PO) Take 5,000 Units by mouth every Monday, Wednesday, and Friday. Takes at night    [provider]  fluticasone (FLONASE) 50 MCG/ACT nasal spray Place 2 sprays into both nostrils daily. Patient not taking: Reported on 05/05/2020    [provider]  hydrALAZINE (APRESOLINE) 10 MG tablet Take 10 mg by mouth 2 (two) times daily.    [provider]  hydrochlorothiazide (HYDRODIURIL) 25 MG tablet Take 25 mg by mouth every morning. 11/28/17   [provider]  hydrOXYzine (VISTARIL) 25 MG capsule Take 25 mg by mouth 2 (two) times daily as needed.    [provider]  insulin aspart (NOVOLOG) 100 UNIT/ML injection Inject 0-8 Units into the skin 3 (three) times daily as needed for high blood sugar.     [provider]  insulin glargine (LANTUS) 100 UNIT/ML injection Inject 20 Units into the skin 2 (two) times daily.     [provider]  LACTOBACILLUS BIFIDUS PO Take 1 tablet by mouth daily.     [provider]  magnesium oxide (MAG-OX) 400 MG tablet Take 400 mg by mouth at bedtime.    [provider]  Multiple Vitamin (MULTIVITAMIN WITH MINERALS) TABS Take 1 tablet by mouth every evening.     [provider]  Naphazoline-Pheniramine 0.027-0.315 % SOLN Apply 1 drop to eye daily as needed (dry eyes).    [provider]  neomycin-polymyxin-hydrocortisone (CORTISPORIN) OTIC  solution Place 4 drops into the right ear 4 (four) times daily.    [provider]  olopatadine (PATANOL) 0.1 % ophthalmic solution Place 2 drops into both eyes daily.    [provider]  Omega-3 Fatty Acids (FISH OIL PO) Take 1 capsule by mouth every evening.     [provider]  OVER THE COUNTER MEDICATION Take 1 capsule by mouth at bedtime. Oil of [place]    [provider]  potassium gluconate 595 MG TABS Take 595 mg by mouth every evening.  [provider]  pravastatin (PRAVACHOL) 20 MG tablet Take 20 mg by mouth daily. Patient not taking: Reported on 05/05/2020    [provider]  tadalafil (CIALIS) 20 MG tablet Take 20 mg by mouth daily as needed for erectile dysfunction.    [provider]  TURMERIC PO Take 1 tablet by mouth every evening.     [provider]    Allergies    Ace inhibitors, Ciprofloxacin, Peanuts [peanut oil], Tamiflu [oseltamivir phosphate], Atorvastatin, Citalopram, Flagyl [metronidazole], Gabapentin, Lisinopril, Losartan, Lyrica [pregabalin], Remeron [mirtazapine], Simvastatin, Statins, Victoza [liraglutide], and Avelox [moxifloxacin hcl in nacl]  Review of Systems   Review of Systems  Constitutional: Negative for chills, fatigue and fever.  HENT: Negative for ear pain and sore throat.   Eyes: Negative for pain and visual disturbance.  Respiratory: Negative for cough and shortness of breath.   Cardiovascular: Negative for chest pain and palpitations.  Gastrointestinal: Positive for abdominal pain, anorexia, nausea and vomiting. Negative for constipation, diarrhea, flatus, hematemesis, hematochezia and melena.  Genitourinary: Negative for dysuria and hematuria.  Musculoskeletal: Negative for arthralgias and back pain.  Skin: Negative for color change and rash.  Neurological: Negative for seizures and syncope.  All other systems reviewed and are negative.   Physical Exam Updated Vital  Signs BP (!) 166/67 (BP Location: Right Arm)   Pulse 71   Temp (!) 97.4 F (36.3 C) (Oral)   Resp 12   Ht 6\' 2"  (1.88 m)   Wt 121.1 kg   SpO2 98%   BMI 34.28 kg/m   Physical Exam Vitals and nursing note reviewed.  Constitutional:      Appearance: He is well-developed. He is not ill-appearing or toxic-appearing.  HENT:     Head: Normocephalic and atraumatic.  Eyes:     Conjunctiva/sclera: Conjunctivae normal.  Cardiovascular:     Rate and Rhythm: Normal rate and regular rhythm.     Heart sounds: No murmur heard.   Pulmonary:     Effort: Pulmonary effort is normal. No respiratory distress.     Breath sounds: Normal breath sounds.  Abdominal:     General: Abdomen is protuberant.     Palpations: Abdomen is soft.     Tenderness: There is abdominal tenderness in the right upper quadrant. There is no right CVA tenderness, left CVA tenderness, guarding or rebound. Positive signs include Murphy's sign.     Hernia: No hernia is present.  Musculoskeletal:     Cervical back: Neck supple.  Skin:    General: Skin is warm and dry.  Neurological:     Mental Status: He is alert.     ED Results / Procedures / Treatments   Labs (all labs ordered are listed, but only abnormal results are displayed) Labs Reviewed  COMPREHENSIVE METABOLIC PANEL - Abnormal; Notable for the following components:      Result Value   Sodium 129 (*)    Chloride 91 (*)    Glucose, Bld 271 (*)    All other components within normal limits  CBC WITH DIFFERENTIAL/PLATELET - Abnormal; Notable for the following components:   WBC 14.9 (*)    Neutro Abs 12.9 (*)    Abs Immature Granulocytes 0.09 (*)    All other components within normal limits  URINALYSIS, ROUTINE W REFLEX MICROSCOPIC - Abnormal; Notable for the following components:   Glucose, UA >=500 (*)    Hgb urine dipstick SMALL (*)    Ketones, ur 5 (*)    Protein, ur 100 (*)  All other components within normal limits  SARS CORONAVIRUS 2 (TAT 6-24  HRS)  CULTURE, BLOOD (ROUTINE X 2)  CULTURE, BLOOD (ROUTINE X 2)  LIPASE, BLOOD  LACTIC ACID, PLASMA  CBC  CREATININE, SERUM  HEMOGLOBIN A1C    EKG None  Radiology CT ABDOMEN PELVIS WO CONTRAST  Result Date: 07/29/2020 CLINICAL DATA:  Acute onset of right-sided abdominal pain. Kidney stone or appendicitis suspected EXAM: CT ABDOMEN AND PELVIS WITHOUT CONTRAST TECHNIQUE: Multidetector CT imaging of the abdomen and pelvis was performed following the standard protocol without IV contrast. COMPARISON:  Abdominopelvic CT 09/07/2019 FINDINGS: Lower chest: Mild lower lobe atelectasis. Minimal bronchiectasis in the right lower lobe. Coronary artery calcifications. Mitral annulus calcifications. Normal heart size. Stable 4 mm left lower lobe pulmonary nodule, series 5, image 14. This is been unchanged since 2013 CT and is consistent with benign process. Hepatobiliary: No focal hepatic abnormality on noncontrast exam. Gallbladder is distended with slight indistinctness of gallbladder wall, series 3, image 36. No calcified gallstone. There is no biliary dilatation. Pancreas: No ductal dilatation or inflammation. Spleen: Normal in size without focal abnormality. Adrenals/Urinary Tract: Mild adrenal thickening without dominant adrenal nodule. Symmetric perinephric edema which is unchanged from prior exam. No renal or ureteral stones. No evidence of focal renal lesion. Partially distended urinary bladder without wall thickening or stone. Stomach/Bowel: Normal appendix, series 3, image 53. The stomach is decompressed. Small duodenal diverticulum. There is no small bowel obstruction or inflammation. Few fluid-filled loops of small bowel in the pelvis without wall thickening or inflammation. No terminal ileal inflammation. Small to moderate colonic stool burden. Descending and sigmoid colonic diverticulosis. No diverticulitis or colonic inflammation. Sigmoid colon is redundant. Vascular/Lymphatic: Moderate aortic  atherosclerosis. No aortic aneurysm. Few scattered periportal nodes are likely reactive. No enlarged lymph nodes in the abdomen or pelvis. Reproductive: Normal sized prostate gland.  Urolith. Other: No free air. No ascites. Tiny fat containing umbilical hernia. Fat within both inguinal canals, right greater than left. Musculoskeletal: Multilevel degenerative change throughout the spine. Mild degenerative change of both hips. There are no acute or suspicious osseous abnormalities. IMPRESSION: 1. Distended gallbladder with slight indistinctness of gallbladder wall, suspicious for acute cholecystitis. Recommend right upper quadrant ultrasound for further evaluation. 2. No renal stones or obstructive uropathy. Normal appendix. 3. Colonic diverticulosis without diverticulitis. Aortic Atherosclerosis (ICD10-I70.0). Electronically Signed   By: Keith Rake M.D.   On: 07/29/2020 21:52   US Abdomen Limited  Result Date: 07/29/2020 CLINICAL DATA:  Right-sided abdominal pain EXAM: ULTRASOUND ABDOMEN LIMITED RIGHT UPPER QUADRANT COMPARISON:  07/29/2020 FINDINGS: Gallbladder: No gallstones or biliary sludge. Mild gallbladder wall thickening measuring 4 mm, nonspecific. No pericholecystic fluid. Negative sonographic Murphy sign. Common bile duct: Diameter: 4 mm Liver: No focal lesion identified. Within normal limits in parenchymal echogenicity. Portal vein is patent on color Doppler imaging with normal direction of blood flow towards the liver. Other: None. IMPRESSION: 1. Nonspecific mild gallbladder wall thickening measuring 4 mm. No evidence of cholelithiasis or gallbladder sludge. If acute acalculous cholecystitis is suspected, follow-up nuclear medicine hepatobiliary scan could be considered. 2. Otherwise unremarkable right upper quadrant ultrasound. Electronically Signed   By: Randa Ngo M.D.   On: 07/29/2020 22:51    Procedures Procedures   Medications Ordered in ED Medications  ondansetron (ZOFRAN)  injection 4 mg (has no administration in time range)  enoxaparin (LOVENOX) injection 40 mg (has no administration in time range)  insulin aspart (novoLOG) injection 0-9 Units (has no administration in time range)  insulin aspart (novoLOG) injection 0-5 Units (has no administration in time range)  hydrALAZINE (APRESOLINE) tablet 10 mg (has no administration in time range)  lactated ringers infusion (has no administration in time range)  piperacillin-tazobactam (ZOSYN) IVPB 3.375 g (has no administration in time range)    ED Course  I have reviewed the triage vital signs and the nursing notes.  Pertinent labs & imaging results that were available during my care of the patient were reviewed by me and considered in my medical decision making (see chart for details).    MDM Rules/Calculators/A&P                          This is a 70 year old male with no reported previous abdominal surgical history who presented to the emergency department with somewhat acute/subacute onset right upper quadrant abdominal pain prior to arrival today.  This was associated with one episode of vomiting, anorexia and constant nausea. Exam concerning for biliary pathology such as cholecystitis, choledocholithiasis. Also considering pancreatitis. Lower suspicion for bowel obstruction, perforated viscus.  No urinary symptoms to suggest UTI or pyelonephritis.  History and exam would be consistent with nephrolithiasis.  CT with nonspecific evidence of inflammation about the gallbladder, radiology recommended ultrasound which was also obtained and was not consistent with cholecystitis however there were again nonspecific inflammatory findings.  On reassessment he is still having significant right upper quadrant tenderness on exam. I feel as though his presentation is concerning for cholecystitis with evidence of infection, leukocytosis.  Work-up not consistent with pancreatitis.  Admitted to medicine for nuclear medicine  scan.  Final Clinical Impression(s) / ED Diagnoses Final diagnoses:  RUQ abdominal pain    Rx / DC Orders ED Discharge Orders    None       Pearson Grippe, DO 07/30/20 [id number]    Davonna Belling, MD 07/30/20 [id number]

## 2020-07-30 ENCOUNTER — Observation Stay (HOSPITAL_COMMUNITY): Payer: No Typology Code available for payment source

## 2020-07-30 DIAGNOSIS — R1011 Right upper quadrant pain: Secondary | ICD-10-CM | POA: Diagnosis not present

## 2020-07-30 LAB — CBC
HCT: 42.4 % (ref 39.0–52.0)
Hemoglobin: 14.7 g/dL (ref 13.0–17.0)
MCH: 29.3 pg (ref 26.0–34.0)
MCHC: 34.7 g/dL (ref 30.0–36.0)
MCV: 84.6 fL (ref 80.0–100.0)
Platelets: 288 10*3/uL (ref 150–400)
RBC: 5.01 MIL/uL (ref 4.22–5.81)
RDW: 13.1 % (ref 11.5–15.5)
WBC: 9.6 10*3/uL (ref 4.0–10.5)
nRBC: 0 % (ref 0.0–0.2)

## 2020-07-30 LAB — MAGNESIUM: Magnesium: 2 mg/dL (ref 1.7–2.4)

## 2020-07-30 LAB — COMPREHENSIVE METABOLIC PANEL
ALT: 24 U/L (ref 0–44)
AST: 24 U/L (ref 15–41)
Albumin: 3.4 g/dL — ABNORMAL LOW (ref 3.5–5.0)
Alkaline Phosphatase: 86 U/L (ref 38–126)
Anion gap: 8 (ref 5–15)
BUN: 9 mg/dL (ref 8–23)
CO2: 30 mmol/L (ref 22–32)
Calcium: 9 mg/dL (ref 8.9–10.3)
Chloride: 96 mmol/L — ABNORMAL LOW (ref 98–111)
Creatinine, Ser: 0.8 mg/dL (ref 0.61–1.24)
GFR, Estimated: >60 mL/min (ref >60)
Glucose, Bld: 170 mg/dL — ABNORMAL HIGH (ref 70–99)
Potassium: 3.6 mmol/L (ref 3.5–5.1)
Sodium: 134 mmol/L — ABNORMAL LOW (ref 135–145)
Total Bilirubin: 0.8 mg/dL (ref 0.3–1.2)
Total Protein: 6.3 g/dL — ABNORMAL LOW (ref 6.5–8.1)

## 2020-07-30 LAB — HEMOGLOBIN A1C
Hgb A1c MFr Bld: 7.3 % — ABNORMAL HIGH (ref 4.8–5.6)
Mean Plasma Glucose: 163 mg/dL

## 2020-07-30 LAB — PHOSPHORUS: Phosphorus: 3.5 mg/dL (ref 2.5–4.6)

## 2020-07-30 LAB — SARS CORONAVIRUS 2 (TAT 6-24 HRS): SARS Coronavirus 2: NEGATIVE

## 2020-07-30 LAB — CBG MONITORING, ED: Glucose-Capillary: 231 mg/dL — ABNORMAL HIGH (ref 70–99)

## 2020-07-30 LAB — GLUCOSE, CAPILLARY
Glucose-Capillary: 183 mg/dL — ABNORMAL HIGH (ref 70–99)
Glucose-Capillary: 240 mg/dL — ABNORMAL HIGH (ref 70–99)
Glucose-Capillary: 210 mg/dL — ABNORMAL HIGH (ref 70–99)
Glucose-Capillary: 202 mg/dL — ABNORMAL HIGH (ref 70–99)
Glucose-Capillary: 192 mg/dL — ABNORMAL HIGH (ref 70–99)

## 2020-07-30 MED ORDER — PROCHLORPERAZINE EDISYLATE 10 MG/2ML IJ SOLN
5.0000 mg | Freq: Four times a day (QID) | INTRAMUSCULAR | Status: DC | PRN
  Administered 2020-07-30: 5 mg via INTRAVENOUS
  Filled 2020-07-30: qty 2

## 2020-07-30 MED ORDER — HYDRALAZINE HCL 20 MG/ML IJ SOLN: 5.0000 mg | Freq: Four times a day (QID) | INTRAMUSCULAR | Status: DC | PRN

## 2020-07-30 MED ORDER — TECHNETIUM TC 99M MEBROFENIN IV KIT
5.4000 | PACK | Freq: Once | INTRAVENOUS | Status: AC | PRN
  Administered 2020-07-30: 5.4 via INTRAVENOUS

## 2020-07-30 MED ORDER — ACETAMINOPHEN 325 MG PO TABS: 650.0000 mg | ORAL_TABLET | Freq: Four times a day (QID) | ORAL | Status: DC | PRN

## 2020-07-30 MED ORDER — INSULIN GLARGINE 100 UNIT/ML ~~LOC~~ SOLN
15.0000 [IU] | Freq: Two times a day (BID) | SUBCUTANEOUS | Status: DC
  Administered 2020-07-30 – 2020-07-31 (×2): 15 [IU] via SUBCUTANEOUS
  Filled 2020-07-30 (×3): qty 0.15

## 2020-07-30 MED ORDER — HYDROXYZINE HCL 25 MG PO TABS
25.0000 mg | ORAL_TABLET | Freq: Three times a day (TID) | ORAL | Status: DC | PRN
  Administered 2020-07-30: 25 mg via ORAL
  Filled 2020-07-30: qty 1

## 2020-07-30 MED ORDER — FLUTICASONE PROPIONATE 50 MCG/ACT NA SUSP
1.0000 | Freq: Every day | NASAL | Status: DC
  Administered 2020-07-30 – 2020-07-31 (×2): 1 via NASAL
  Filled 2020-07-30: qty 16

## 2020-07-30 MED ORDER — OLOPATADINE HCL 0.1 % OP SOLN
1.0000 [drp] | Freq: Two times a day (BID) | OPHTHALMIC | Status: DC
  Administered 2020-07-30 – 2020-07-31 (×2): 1 [drp] via OPHTHALMIC
  Filled 2020-07-30: qty 5

## 2020-07-30 MED ORDER — POLYETHYLENE GLYCOL 3350 17 G PO PACK
17.0000 g | PACK | Freq: Two times a day (BID) | ORAL | Status: DC
  Administered 2020-07-30: 17 g via ORAL
  Filled 2020-07-30 (×2): qty 1

## 2020-07-30 MED ORDER — CARVEDILOL 6.25 MG PO TABS
6.2500 mg | ORAL_TABLET | Freq: Two times a day (BID) | ORAL | Status: DC
  Administered 2020-07-30 – 2020-07-31 (×3): 6.25 mg via ORAL
  Filled 2020-07-30 (×3): qty 1

## 2020-07-30 MED ORDER — ALUM & MAG HYDROXIDE-SIMETH 200-200-20 MG/5ML PO SUSP
15.0000 mL | Freq: Once | ORAL | Status: AC
  Administered 2020-07-30: 15 mL via ORAL
  Filled 2020-07-30: qty 30

## 2020-07-30 MED ORDER — PIPERACILLIN-TAZOBACTAM 3.375 G IVPB
3.3750 g | Freq: Three times a day (TID) | INTRAVENOUS | Status: DC
  Administered 2020-07-30 (×3): 3.375 g via INTRAVENOUS
  Filled 2020-07-30 (×4): qty 50

## 2020-07-30 MED ORDER — MELATONIN 3 MG PO TABS
3.0000 mg | ORAL_TABLET | Freq: Every evening | ORAL | Status: DC | PRN
  Administered 2020-07-30: 3 mg via ORAL
  Filled 2020-07-30: qty 1

## 2020-07-30 NOTE — Consult Note (Signed)
[name]. [birth year]  [id number].    Requesting MD: Dr. Fayrene Helper Chief Complaint/Reason for Consult: biliary dyskinesia   HPI:  This is a 70 yo male with a history of DM, OSA, PVD, HTN, HLD, diverticulitis with sigmoid colon stricture, who had an acute onset of epigastric/RUQ pain yesterday around 1500.  He has never had anything like this before and states it was the worst pain in his life, 10/10.  He had some nausea but no emesis along with some chills but no fever.  He had eaten a normal lunch around 12 including a [place] sandwich with grapes and an apple.  After this pain started he took a Tums which didn't help, but then took Mylanta and called EMS to assure he wasn't having an MI.  Once in the ambulance, his pain resolved and has never returned.  His labs were normal upon arrival to the ED, except a slight elevation in his WBC at 14.9K, which has normalized today.  He underwent a CT of the A/P which revealed a distended gallbladder and a [place] was recommended.  This revealed no gallstone or biliary sludge with mild wall thickening at 55mm.  A HIDA was recommended.  This was normal but was found to have an EF of 4%.  We have been asked to see for biliary dyskinesia.  He has been eating since admission a normal diet and his pain has resolved with no recurrence.  ROS: ROS: Please see HPI, otherwise all other systems have been reviewed and are negative.  Family History  Problem Relation Age of Onset  . Hypertension Mother   . Alzheimer's disease Mother   . Stroke Father   . Heart attack Maternal Grandfather 50  . Cancer Paternal Grandfather        type unknown  . Diabetes Brother   . Colon polyps Neg Hx   . Colon cancer Neg Hx   . Liver cancer Neg Hx   . Stomach cancer Neg Hx   . Rectal cancer Neg Hx     Past Medical History:  Diagnosis Date  . Allergy   . Anxiety   . Arthritis   . Cataract    bilateral lens implants  . Clotting disorder ([place])    DVT in 2006  x1; no problems since  . Colon stricture - diverticular ([place]) 09/07/2019  . Diabetes mellitus   . Diverticulitis   . Diverticulosis   . DVT (deep venous thrombosis) ([place]) 2007   left leg  . HLD (hyperlipidemia)   . Hypertension   . Peripheral vascular disease ([place]) 2006   dvt 's  . Pneumonia 06  . Sleep apnea    cpap started 2 months ago set on 4  . Subdural hematoma [facility])     Past Surgical History:  Procedure Laterality Date  . BRAIN SURGERY  2010   subdural hematoma -bleed  . CATARACT EXTRACTION, BILATERAL  12/16, 2017   cataract  . COLONOSCOPY  2014  . CYSTOSCOPY WITH INSERTION OF UROLIFT    . CYSTOSCOPY WITH INSERTION OF UROLIFT    . DG THUMB LEFT HAND Left    "gamers "  . ROTATOR CUFF REPAIR W/ DISTAL CLAVICLE EXCISION Left 05/20/2015  . SHOULDER ARTHROSCOPY WITH SUBACROMIAL DECOMPRESSION, ROTATOR CUFF REPAIR AND BICEP TENDON REPAIR Left 05/20/2015   Procedure: LEFT SHOULDER ARTHROSCOPY WITH SUBACROMIAL DECOMPRESSION,DISTAL CLAVICAL RESECTION  ROTATOR CUFF REPAIR ;  Surgeon: Justice Britain, MD;  Location: [facility];  Service: Orthopedics;  Laterality: Left;  . TONSILLECTOMY      Social History:  reports that he quit smoking about 43 years ago. His smoking use included cigarettes. He has a 15.00 pack-year smoking history. He has never used smokeless tobacco. He reports current alcohol use of about 10.0 standard drinks of alcohol per week. He reports current drug use. Drug: Marijuana.  Allergies:  Allergies  Allergen Reactions  . Ace Inhibitors Other (See Comments)    Angioedema per pt report to admiting MD On 07/31/2019   . Ciprofloxacin Swelling  . Gabapentin Swelling    Other reaction(s): Influenza-like illness  . Peanut Oil Shortness Of Breath    Swelling throat, sob  Other reaction(s): anaphylaxis  . Tamiflu [Oseltamivir Phosphate] Shortness Of Breath  . Atorvastatin Other (See Comments)    Myalgias  . Lisinopril Other (See Comments)    Other reaction(s): Muscle pain   . Losartan Swelling    Other reaction(s): Lip swelling  . Lyrica [Pregabalin] Swelling  . Oxcarbazepine Swelling    Other reaction(s): Lip swelling  . Simvastatin Other (See Comments)    Myalgias  . Statins Other (See Comments)    Leg problems. Other reaction(s): muscle aches, Muscle pain  . Flagyl [Metronidazole] Other (See Comments)    Unknown reaction  . Remeron [Mirtazapine] Other (See Comments)    Unknown reaction  . Victoza [Liraglutide] Other (See Comments)    Unknown reaction  . Avelox [Moxifloxacin Hcl In Nacl] Rash  . Citalopram Anxiety    Other reaction(s): Drowsy, Anxiety    Medications Prior to Admission  Medication Sig Dispense Refill  . Azelastine HCl 137 MCG/SPRAY SOLN Place 1 spray into both nostrils 2 (two) times daily.     . carvedilol (COREG) 25 MG tablet Take 25 mg by mouth 2 (two) times daily.    . Cholecalciferol (DIALYVITE VITAMIN D 5000 PO) Take 5,000 Units by mouth every Monday, Wednesday, and Friday. Takes at night    . felodipine (PLENDIL) 10 MG 24 hr tablet Take 10 mg by mouth daily.    . hydrALAZINE (APRESOLINE) 10 MG tablet Take 10 mg by mouth 2 (two) times daily.    . hydrochlorothiazide (HYDRODIURIL) 25 MG tablet Take 25 mg by mouth every morning.    . hydrOXYzine (VISTARIL) 25 MG capsule Take 25 mg by mouth 2 (two) times daily as needed.    . insulin aspart (NOVOLOG) 100 UNIT/ML injection Inject 0-8 Units into the skin 3 (three) times daily as needed for high blood sugar.     . insulin glargine (LANTUS) 100 UNIT/ML injection Inject 15 Units into the skin 2 (two) times daily.    Marland Kitchen LACTOBACILLUS BIFIDUS PO Take 1 tablet by mouth daily.     . magnesium oxide (MAG-OX) 400 MG tablet Take 400 mg by mouth at bedtime.    . Multiple Vitamin (MULTIVITAMIN WITH MINERALS) TABS Take 1 tablet by mouth every evening.     . Naphazoline-Pheniramine 0.027-0.315 % SOLN Apply 1 drop to eye daily as needed (dry eyes).    . neomycin-polymyxin-hydrocortisone  (CORTISPORIN) OTIC solution Place 4 drops into the right ear 4 (four) times daily.    . Omega-3 Fatty Acids (FISH OIL PO) Take 1 capsule by mouth every evening.     Marland Kitchen OVER THE COUNTER MEDICATION Take 1 capsule by mouth at bedtime. Oil of Oregano    . polyethylene glycol powder (GLYCOLAX/MIRALAX) 17 GM/SCOOP powder Take 17 g by mouth at bedtime as needed for mild constipation or moderate constipation.    Marland Kitchen  potassium gluconate 595 MG TABS Take 595 mg by mouth every evening.     . tadalafil (CIALIS) 20 MG tablet Take 20 mg by mouth daily as needed for erectile dysfunction.    . TURMERIC PO Take 1 tablet by mouth every evening.        Physical Exam: Blood pressure (!) 160/61, pulse 61, temperature 98.4 F (36.9 C), temperature source Oral, resp. rate 19, height 6\' 2"  (1.88 m), weight 119.6 kg, SpO2 99 %. General: pleasant, WD, WN, obese white male who is laying in bed in NAD HEENT: head is normocephalic, atraumatic.  Sclera are noninjected.  PERRL.  Ears and nose without any masses or lesions.  Mouth is pink and moist Heart: regular, rate, and rhythm.  Normal s1,s2. No obvious murmurs, gallops, or rubs noted.  Palpable radial and pedal pulses bilaterally Lungs: CTAB, no wheezes, rhonchi, or rales noted.  Respiratory effort nonlabored Abd: soft, minimal RUQ soreness with very deep palpation only, ND, +BS, no masses, hernias, or organomegaly MS: all 4 extremities are symmetrical with no cyanosis, clubbing, or edema. Skin: warm and dry with no masses, lesions, or rashes Neuro: Cranial nerves 2-12 grossly intact, sensation is normal throughout Psych: A&Ox3 with an appropriate affect.   Results for orders placed or performed during the hospital encounter of 07/29/20 (from the past 48 hour(s))  Comprehensive metabolic panel     Status: Abnormal   Collection Time: 07/29/20  8:45 PM  Result Value Ref Range   Sodium 129 (L) 135 - 145 mmol/L   Potassium 3.7 3.5 - 5.1 mmol/L   Chloride 91 (L) 98 - 111  mmol/L   CO2 27 22 - 32 mmol/L   Glucose, Bld 271 (H) 70 - 99 mg/dL    Comment: Glucose reference range applies only to samples taken after fasting for at least 8 hours.   BUN 15 8 - 23 mg/dL   Creatinine, Ser 0.88 0.61 - 1.24 mg/dL   Calcium 8.9 8.9 - 10.3 mg/dL   Total Protein 6.9 6.5 - 8.1 g/dL   Albumin 3.6 3.5 - 5.0 g/dL   AST 32 15 - 41 U/L   ALT 23 0 - 44 U/L   Alkaline Phosphatase 87 38 - 126 U/L   Total Bilirubin 1.0 0.3 - 1.2 mg/dL   GFR, Estimated >60 >60 mL/min    Comment: (NOTE) Calculated using the CKD-EPI Creatinine Equation (2021)    Anion gap 11 5 - 15    Comment: Performed at [facility] [address] [facility] [facility] [facility], [place] [address]  Lipase, blood     Status: None   Collection Time: 07/29/20  8:45 PM  Result Value Ref Range   Lipase 27 11 - 51 U/L    Comment: Performed at [facility] [address], [place],  [address]  CBC with Diff     Status: Abnormal   Collection Time: 07/29/20  8:45 PM  Result Value Ref Range   WBC 14.9 (H) 4.0 - 10.5 K/uL   RBC 5.22 4.22 - 5.81 MIL/uL   Hemoglobin 15.3 13.0 - 17.0 g/dL   HCT 44.9 39.0 - 52.0 %   MCV 86.0 80.0 - 100.0 fL   MCH 29.3 26.0 - 34.0 pg   MCHC 34.1 30.0 - 36.0 g/dL   RDW 13.0 11.5 - 15.5 %   Platelets 299 150 - 400 K/uL   nRBC 0.0 0.0 - 0.2 %   Neutrophils Relative % 87 %   Neutro  Abs 12.9 (H) 1.7 - 7.7 K/uL   Lymphocytes Relative 6 %   Lymphs Abs 0.9 0.7 - 4.0 K/uL   Monocytes Relative 5 %   Monocytes Absolute 0.8 0.1 - 1.0 K/uL   Eosinophils Relative 1 %   Eosinophils Absolute 0.1 0.0 - 0.5 K/uL   Basophils Relative 0 %   Basophils Absolute 0.1 0.0 - 0.1 K/uL   Immature Granulocytes 1 %   Abs Immature Granulocytes 0.09 (H) 0.00 - 0.07 K/uL    Comment: Performed at [facility] [address], [place], [place] [address]  Urinalysis, Routine w reflex microscopic Urine, Clean Catch     Status: Abnormal   Collection Time: 07/29/20 10:04 PM  Result Value Ref Range   Color,  Urine YELLOW YELLOW   APPearance CLEAR CLEAR   Specific Gravity, Urine 1.011 1.005 - 1.030   pH 7.0 5.0 - 8.0   Glucose, UA >=500 (A) NEGATIVE mg/dL   Hgb urine dipstick SMALL (A) NEGATIVE   Bilirubin Urine NEGATIVE NEGATIVE   Ketones, ur 5 (A) NEGATIVE mg/dL   Protein, ur 100 (A) NEGATIVE mg/dL   Nitrite NEGATIVE NEGATIVE   Leukocytes,Ua NEGATIVE NEGATIVE   RBC / HPF 0-5 0 - 5 RBC/hpf   WBC, UA 0-5 0 - 5 WBC/hpf   Bacteria, UA NONE SEEN NONE SEEN    Comment: Performed at [facility] [address] [place], [place] [address]  Lactic acid, plasma     Status: None   Collection Time: 07/29/20 10:12 PM  Result Value Ref Range   Lactic Acid, Venous 1.2 0.5 - 1.9 mmol/L    Comment: Performed at [facility] [address] [place], [place] [address]  SARS CORONAVIRUS 2 (TAT 6-24 HRS) Nasopharyngeal Nasopharyngeal Swab     Status: None   Collection Time: 07/29/20 10:25 PM   Specimen: Nasopharyngeal Swab  Result Value Ref Range   SARS Coronavirus 2 NEGATIVE NEGATIVE    Comment: (NOTE) SARS-CoV-2 target nucleic acids are NOT DETECTED.  The SARS-CoV-2 RNA is generally detectable in upper and lower respiratory specimens during the acute phase of infection. Negative results do not preclude SARS-CoV-2 infection, do not rule out co-infections with other pathogens, and should not be used as the sole basis for treatment or other patient management decisions. Negative results must be combined with clinical observations, patient history, and epidemiological information. The expected result is Negative.  Fact Sheet for Patients: SugarRoll.be  Fact Sheet for Healthcare Providers: https://www.woods-mathews.com/  This test is not yet approved or cleared by the [place] FDA and  has been authorized for detection and/or diagnosis of SARS-CoV-2 by FDA under an Emergency Use Authorization (EUA). This EUA will remain  in effect (meaning  this test can be used) for the duration of the COVID-19 declaration under Se ction 564(b)(1) of the Act, 21 U.S.C. section 360bbb-3(b)(1), unless the authorization is terminated or revoked sooner.  Performed at [facility], [place] [address], [place],  [address]   CBG monitoring, ED     Status: Abnormal   Collection Time: 07/30/20  1:01 AM  Result Value Ref Range   Glucose-Capillary 231 (H) 70 - 99 mg/dL    Comment: Glucose reference range applies only to samples taken after fasting for at least 8 hours.  CBC     Status: None   Collection Time: 07/30/20  5:23 AM  Result Value Ref Range   WBC 9.6 4.0 - 10.5 K/uL  RBC 5.01 4.22 - 5.81 MIL/uL   Hemoglobin 14.7 13.0 - 17.0 g/dL   HCT 42.4 39.0 - 52.0 %   MCV 84.6 80.0 - 100.0 fL   MCH 29.3 26.0 - 34.0 pg   MCHC 34.7 30.0 - 36.0 g/dL   RDW 13.1 11.5 - 15.5 %   Platelets 288 150 - 400 K/uL   nRBC 0.0 0.0 - 0.2 %    Comment: Performed at [facility], [place] [address] [place], [place] [address]  Comprehensive metabolic panel     Status: Abnormal   Collection Time: 07/30/20  5:23 AM  Result Value Ref Range   Sodium 134 (L) 135 - 145 mmol/L   Potassium 3.6 3.5 - 5.1 mmol/L   Chloride 96 (L) 98 - 111 mmol/L   CO2 30 22 - 32 mmol/L   Glucose, Bld 170 (H) 70 - 99 mg/dL    Comment: Glucose reference range applies only to samples taken after fasting for at least 8 hours.   BUN 9 8 - 23 mg/dL   Creatinine, Ser 0.80 0.61 - 1.24 mg/dL   Calcium 9.0 8.9 - 10.3 mg/dL   Total Protein 6.3 (L) 6.5 - 8.1 g/dL   Albumin 3.4 (L) 3.5 - 5.0 g/dL   AST 24 15 - 41 U/L   ALT 24 0 - 44 U/L   Alkaline Phosphatase 86 38 - 126 U/L   Total Bilirubin 0.8 0.3 - 1.2 mg/dL   GFR, Estimated >60 >60 mL/min    Comment: (NOTE) Calculated using the CKD-EPI Creatinine Equation (2021)    Anion gap 8 5 - 15    Comment: Performed at [facility], [place] [address], [place], [place] [address]  Magnesium     Status: None   Collection Time:  07/30/20  5:23 AM  Result Value Ref Range   Magnesium 2.0 1.7 - 2.4 mg/dL    Comment: Performed at [facility] [address], [place], [place] [address]  Phosphorus     Status: None   Collection Time: 07/30/20  5:23 AM  Result Value Ref Range   Phosphorus 3.5 2.5 - 4.6 mg/dL    Comment: Performed at [facility] [address], [place], [place] [address]  Glucose, capillary     Status: Abnormal   Collection Time: 07/30/20  6:21 AM  Result Value Ref Range   Glucose-Capillary 183 (H) 70 - 99 mg/dL    Comment: Glucose reference range applies only to samples taken after fasting for at least 8 hours.  Glucose, capillary     Status: Abnormal   Collection Time: 07/30/20 11:20 AM  Result Value Ref Range   Glucose-Capillary 240 (H) 70 - 99 mg/dL    Comment: Glucose reference range applies only to samples taken after fasting for at least 8 hours.  Glucose, capillary     Status: Abnormal   Collection Time: 07/30/20 11:59 AM  Result Value Ref Range   Glucose-Capillary 210 (H) 70 - 99 mg/dL    Comment: Glucose reference range applies only to samples taken after fasting for at least 8 hours.  Glucose, capillary     Status: Abnormal   Collection Time: 07/30/20  4:44 PM  Result Value Ref Range   Glucose-Capillary 202 (H) 70 - 99 mg/dL    Comment: Glucose reference range applies only to samples taken after fasting for at least 8 hours.   CT ABDOMEN PELVIS WO CONTRAST  Result Date: 07/29/2020 CLINICAL DATA:  Acute onset of right-sided abdominal pain. Kidney stone or appendicitis suspected EXAM: CT ABDOMEN AND PELVIS WITHOUT CONTRAST TECHNIQUE: Multidetector CT imaging of the abdomen and pelvis was performed following the standard protocol without IV contrast. COMPARISON:  Abdominopelvic CT 09/07/2019 FINDINGS: Lower chest: Mild lower lobe atelectasis. Minimal bronchiectasis in the right lower lobe. Coronary artery calcifications. Mitral annulus calcifications. Normal heart size. Stable 4  mm left lower lobe pulmonary nodule, series 5, image 14. This is been unchanged since 2013 CT and is consistent with benign process. Hepatobiliary: No focal hepatic abnormality on noncontrast exam. Gallbladder is distended with slight indistinctness of gallbladder wall, series 3, image 36. No calcified gallstone. There is no biliary dilatation. Pancreas: No ductal dilatation or inflammation. Spleen: Normal in size without focal abnormality. Adrenals/Urinary Tract: Mild adrenal thickening without dominant adrenal nodule. Symmetric perinephric edema which is unchanged from prior exam. No renal or ureteral stones. No evidence of focal renal lesion. Partially distended urinary bladder without wall thickening or stone. Stomach/Bowel: Normal appendix, series 3, image 53. The stomach is decompressed. Small duodenal diverticulum. There is no small bowel obstruction or inflammation. Few fluid-filled loops of small bowel in the pelvis without wall thickening or inflammation. No terminal ileal inflammation. Small to moderate colonic stool burden. Descending and sigmoid colonic diverticulosis. No diverticulitis or colonic inflammation. Sigmoid colon is redundant. Vascular/Lymphatic: Moderate aortic atherosclerosis. No aortic aneurysm. Few scattered periportal nodes are likely reactive. No enlarged lymph nodes in the abdomen or pelvis. Reproductive: Normal sized prostate gland.  Urolith. Other: No free air. No ascites. Tiny fat containing umbilical hernia. Fat within both inguinal canals, right greater than left. Musculoskeletal: Multilevel degenerative change throughout the spine. Mild degenerative change of both hips. There are no acute or suspicious osseous abnormalities. IMPRESSION: 1. Distended gallbladder with slight indistinctness of gallbladder wall, suspicious for acute cholecystitis. Recommend right upper quadrant ultrasound for further evaluation. 2. No renal stones or obstructive uropathy. Normal appendix. 3. Colonic  diverticulosis without diverticulitis. Aortic Atherosclerosis (ICD10-I70.0). Electronically Signed   By: Keith Rake M.D.   On: 07/29/2020 21:52   NM Hepato W/EF  Result Date: 07/30/2020 CLINICAL DATA:  Right upper quadrant abdominal pain EXAM: NUCLEAR MEDICINE HEPATOBILIARY IMAGING WITH GALLBLADDER EF TECHNIQUE: Sequential images of the abdomen were obtained out to 60 minutes following intravenous administration of radiopharmaceutical. After oral ingestion of Ensure, gallbladder ejection fraction was determined. At 60 min, normal ejection fraction is greater than 33%. RADIOPHARMACEUTICALS:  5.4 mCi Tc-62m  Choletec IV COMPARISON:  CT and ultrasound 07/29/2020 FINDINGS: Prompt uptake and biliary excretion of activity by the liver is seen. Gallbladder activity is visualized, consistent with patency of cystic duct. Biliary activity passes into small bowel, consistent with patent common bile duct. Calculated gallbladder ejection fraction is 4%. (Normal gallbladder ejection fraction with Ensure is greater than 33%.) IMPRESSION: 1. Patency of the cystic duct and common bile duct without scintigraphic evidence of cholecystitis. 2. Findings of gallbladder dysfunction with calculated ejection fraction of 4%. Electronically Signed   By: Davina Poke D.O.   On: 07/30/2020 11:16   US Abdomen Limited  Result Date: 07/29/2020 CLINICAL DATA:  Right-sided abdominal pain EXAM: ULTRASOUND ABDOMEN LIMITED RIGHT UPPER QUADRANT COMPARISON:  07/29/2020 FINDINGS: Gallbladder: No gallstones or biliary sludge. Mild gallbladder wall thickening measuring 4 mm, nonspecific. No pericholecystic fluid. Negative sonographic Murphy sign. Common bile duct: Diameter: 4 mm Liver: No focal lesion identified. Within normal limits in parenchymal echogenicity. Portal vein is patent on color Doppler imaging with normal direction of blood  flow towards the liver. Other: None. IMPRESSION: 1. Nonspecific mild gallbladder wall thickening  measuring 4 mm. No evidence of cholelithiasis or gallbladder sludge. If acute acalculous cholecystitis is suspected, follow-up nuclear medicine hepatobiliary scan could be considered. 2. Otherwise unremarkable right upper quadrant ultrasound. Electronically Signed   By: Randa Ngo M.D.   On: 07/29/2020 22:51      Assessment/Plan Biliary dyskinesia The patient has a HIDA scan which revealed an EF of 4%.  This does qualify as biliary dyskinesia; however, it is unclear that his symptoms are related to this.  Usually biliary dyskinesia does not cause acute severe abdominal pain that resolves as quickly as it came.  Usually this is more described as chronic nausea and mild abdominal pain at times.  He has never had any prior symptoms c/w this and his EF is not an acute event.  It has likely been this for a while with no symptoms.  With that being said, it is difficult to definitively say this wasn't the cause as well.  We discussed all of this.  He does not acutely need his gallbladder out as his pain and symptoms have resolved.  It would also be prudent to possibly have a GI evaluation to assure he does not having something else such as gastritis, ulcer disease, etc.  It is also reasonable to watch and see if he ever has a similar pain and if he does not he may not need any further work up.   I have given him our information so that he may follow up as he wishes.   Henreitta Cea, [facility] 07/30/2020, 4:51 PM Please see Amion for pager number during day hours 7:00am-4:30pm or 7:00am -11:30am on weekends

## 2020-07-30 NOTE — ED Notes (Signed)
Attempted to call report-no answer 

## 2020-07-30 NOTE — Progress Notes (Signed)
PROGRESS NOTE    [name].  OYD:[id number] DOB: 1950-08-04 DOA: 07/29/2020 [facility]   Chief Complaint  Patient presents with  . Abdominal Pain    Brief Narrative: Alan Cobern. is [name] 70 y.o. male with medical history significant for diverticulitis, last colonoscopy on 05/05/2020 by Dr. Carlean Purl, essential hypertension, hyperlipidemia, type 2 diabetes, chronic anxiety, who presented to [facility] ED due to sudden onset right upper quadrant severe pain hours prior to his presentation.  Associated with nausea and one episode of vomiting.  Denies any constitutional symptoms.  He presented to the ED for further evaluation.  CT abdomen and pelvis without contrast showed no specific evidence of inflammation of the gallbladder.  Right upper quadrant abdominal ultrasound was also equivocal.  [facility] [place] HIDA scan has been ordered and is pending.  [facility], hospitalist team, was asked to admit.  Assessment & Plan:   Active Problems:   RUQ abdominal pain  Right upper quadrant abdominal pain, rule out acute cholecystitis  Biliary Dyskinesia CT abdomen pelvis without contrast with slight indistinctness of gallbladder wall RUQ [place] with no sludge or cholelithiasis  HIDA without evidence of cholecystitis, but does have gallbladder dysfunction Will consult surgery with regards to the biliary dyskinesia  Resume diet for now with improvement in symptoms Stop abx   Hypovolemic hyponatremia, suspect contributed by home HCTZ. improved Hold off home HCTZ for now.  Type 2 diabetes with hyperglycemia Obtain hemoglobin A1c 6.7 Hold off home oral hypoglycemics Start insulin sliding scale. Resume basal insulin, follow   Essential hypertension BP is not at goal, elevated - continue to monitor  Resume home hydralazine, and Coreg. Hold off home HCTZ due to hyponatremia. Monitor vital signs IV antihypertensives as needed with parameters  Generalized anxiety Resume home hydroxyzine as  needed   DVT prophylaxis: lovenox Code Status: full  Family Communication: none at bedside Disposition:   Status is: Observation  The patient will require care spanning > 2 midnights and should be moved to inpatient because: Inpatient level of care appropriate due to severity of illness  Dispo:  Patient From: Home  Planned Disposition: Home  Medically stable for discharge: No      Consultants:   surgery  Procedures none  Antimicrobials: Anti-infectives (From admission, onward)   Start     Dose/Rate Route Frequency Ordered Stop   07/30/20 0030  piperacillin-tazobactam (ZOSYN) IVPB 3.375 g  Status:  Discontinued        3.375 g 12.5 mL/hr over 240 Minutes Intravenous Every 8 hours 07/30/20 0019 07/30/20 1451         Subjective: Asking to eat  Objective: Vitals:   07/30/20 0100 07/30/20 0205 07/30/20 0447 07/30/20 1147  BP: (!) 162/79 (!) 159/66 (!) 151/69 (!) 160/61  Pulse: 67 63 65 61  Resp: 17 16 18 19   Temp:  98.5 F (36.9 C) 98.8 F (37.1 C) 98.4 F (36.9 C)  TempSrc:  Oral Oral Oral  SpO2: 97% 99% 99% 99%  Weight:  119.6 kg    Height:  6\' 2"  (1.88 m)      Intake/Output Summary (Last 24 hours) at 07/30/2020 1532 Last data filed at 07/30/2020 0300 Gross per 24 hour  Intake 124.82 ml  Output --  Net 124.82 ml   Filed Weights   07/29/20 2033 07/30/20 0205  Weight: 121.1 kg 119.6 kg    Examination:  General exam: Appears calm and comfortable  Respiratory system: Clear to auscultation. Respiratory effort normal. Cardiovascular system: S1 &  S2 heard, RRR.  Gastrointestinal system: RUQ ttp  Central nervous system: Alert and oriented. No focal neurological deficits. Extremities: no LEE Skin: No rashes, lesions or ulcers Psychiatry: Judgement and insight appear normal. Mood & affect appropriate.     Data Reviewed: I have personally reviewed following labs and imaging studies  CBC: Recent Labs  Lab 07/29/20 2045 07/30/20 0523  WBC 14.9* 9.6   NEUTROABS 12.9*  --   HGB 15.3 14.7  HCT 44.9 42.4  MCV 86.0 84.6  PLT 299 585    Basic Metabolic Panel: Recent Labs  Lab 07/29/20 2045 07/30/20 0523  NA 129* 134*  K 3.7 3.6  CL 91* 96*  CO2 27 30  GLUCOSE 271* 170*  BUN 15 9  CREATININE 0.88 0.80  CALCIUM 8.9 9.0  MG  --  2.0  PHOS  --  3.5    GFR: Estimated Creatinine Clearance: 119.8 mL/min (by C-G formula based on SCr of 0.8 mg/dL).  Liver Function Tests: Recent Labs  Lab 07/29/20 2045 07/30/20 0523  AST 32 24  ALT 23 24  ALKPHOS 87 86  BILITOT 1.0 0.8  PROT 6.9 6.3*  ALBUMIN 3.6 3.4*    CBG: Recent Labs  Lab 07/30/20 0101 07/30/20 0621  GLUCAP 231* 183*     Recent Results (from the past 240 hour(s))  SARS CORONAVIRUS 2 (TAT 6-24 HRS) Nasopharyngeal Nasopharyngeal Swab     Status: None   Collection Time: 07/29/20 10:25 PM   Specimen: Nasopharyngeal Swab  Result Value Ref Range Status   SARS Coronavirus 2 NEGATIVE NEGATIVE Final    Comment: (NOTE) SARS-CoV-2 target nucleic acids are NOT DETECTED.  The SARS-CoV-2 RNA is generally detectable in upper and lower respiratory specimens during the acute phase of infection. Negative results do not preclude SARS-CoV-2 infection, do not rule out co-infections with other pathogens, and should not be used as the sole basis for treatment or other patient management decisions. Negative results must be combined with clinical observations, patient history, and epidemiological information. The expected result is Negative.  Fact Sheet for Patients: SugarRoll.be  Fact Sheet for Healthcare Providers: https://www.woods-mathews.com/  This test is not yet approved or cleared by the [place] FDA and  has been authorized for detection and/or diagnosis of SARS-CoV-2 by FDA under an Emergency Use Authorization (EUA). This EUA will remain  in effect (meaning this test can be used) for the duration of the COVID-19  declaration under Se ction 564(b)(1) of the Act, 21 U.S.C. section 360bbb-3(b)(1), unless the authorization is terminated or revoked sooner.  Performed at [facility], [place] [address], [place], [place] [address]          Radiology Studies: CT ABDOMEN PELVIS WO CONTRAST  Result Date: 07/29/2020 CLINICAL DATA:  Acute onset of right-sided abdominal pain. Kidney stone or appendicitis suspected EXAM: CT ABDOMEN AND PELVIS WITHOUT CONTRAST TECHNIQUE: Multidetector CT imaging of the abdomen and pelvis was performed following the standard protocol without IV contrast. COMPARISON:  Abdominopelvic CT 09/07/2019 FINDINGS: Lower chest: Mild lower lobe atelectasis. Minimal bronchiectasis in the right lower lobe. Coronary artery calcifications. Mitral annulus calcifications. Normal heart size. Stable 4 mm left lower lobe pulmonary nodule, series 5, image 14. This is been unchanged since 2013 CT and is consistent with benign process. Hepatobiliary: No focal hepatic abnormality on noncontrast exam. Gallbladder is distended with slight indistinctness of gallbladder wall, series 3, image 36. No calcified gallstone. There is no biliary dilatation. Pancreas: No ductal dilatation or inflammation. Spleen: Normal  in size without focal abnormality. Adrenals/Urinary Tract: Mild adrenal thickening without dominant adrenal nodule. Symmetric perinephric edema which is unchanged from prior exam. No renal or ureteral stones. No evidence of focal renal lesion. Partially distended urinary bladder without wall thickening or stone. Stomach/Bowel: Normal appendix, series 3, image 53. The stomach is decompressed. Small duodenal diverticulum. There is no small bowel obstruction or inflammation. Few fluid-filled loops of small bowel in the pelvis without wall thickening or inflammation. No terminal ileal inflammation. Small to moderate colonic stool burden. Descending and sigmoid colonic diverticulosis. No diverticulitis or colonic  inflammation. Sigmoid colon is redundant. Vascular/Lymphatic: Moderate aortic atherosclerosis. No aortic aneurysm. Few scattered periportal nodes are likely reactive. No enlarged lymph nodes in the abdomen or pelvis. Reproductive: Normal sized prostate gland.  Urolith. Other: No free air. No ascites. Tiny fat containing umbilical hernia. Fat within both inguinal canals, right greater than left. Musculoskeletal: Multilevel degenerative change throughout the spine. Mild degenerative change of both hips. There are no acute or suspicious osseous abnormalities. IMPRESSION: 1. Distended gallbladder with slight indistinctness of gallbladder wall, suspicious for acute cholecystitis. Recommend right upper quadrant ultrasound for further evaluation. 2. No renal stones or obstructive uropathy. Normal appendix. 3. Colonic diverticulosis without diverticulitis. Aortic Atherosclerosis (ICD10-I70.0). Electronically Signed   By: Keith Rake M.D.   On: 07/29/2020 21:52   NM Hepato W/EF  Result Date: 07/30/2020 CLINICAL DATA:  Right upper quadrant abdominal pain EXAM: NUCLEAR MEDICINE HEPATOBILIARY IMAGING WITH GALLBLADDER EF TECHNIQUE: Sequential images of the abdomen were obtained out to 60 minutes following intravenous administration of radiopharmaceutical. After oral ingestion of Ensure, gallbladder ejection fraction was determined. At 60 min, normal ejection fraction is greater than 33%. RADIOPHARMACEUTICALS:  5.4 mCi Tc-9m  Choletec IV COMPARISON:  CT and ultrasound 07/29/2020 FINDINGS: Prompt uptake and biliary excretion of activity by the liver is seen. Gallbladder activity is visualized, consistent with patency of cystic duct. Biliary activity passes into small bowel, consistent with patent common bile duct. Calculated gallbladder ejection fraction is 4%. (Normal gallbladder ejection fraction with Ensure is greater than 33%.) IMPRESSION: 1. Patency of the cystic duct and common bile duct without scintigraphic  evidence of cholecystitis. 2. Findings of gallbladder dysfunction with calculated ejection fraction of 4%. Electronically Signed   By: Davina Poke D.O.   On: 07/30/2020 11:16   US Abdomen Limited  Result Date: 07/29/2020 CLINICAL DATA:  Right-sided abdominal pain EXAM: ULTRASOUND ABDOMEN LIMITED RIGHT UPPER QUADRANT COMPARISON:  07/29/2020 FINDINGS: Gallbladder: No gallstones or biliary sludge. Mild gallbladder wall thickening measuring 4 mm, nonspecific. No pericholecystic fluid. Negative sonographic Murphy sign. Common bile duct: Diameter: 4 mm Liver: No focal lesion identified. Within normal limits in parenchymal echogenicity. Portal vein is patent on color Doppler imaging with normal direction of blood flow towards the liver. Other: None. IMPRESSION: 1. Nonspecific mild gallbladder wall thickening measuring 4 mm. No evidence of cholelithiasis or gallbladder sludge. If acute acalculous cholecystitis is suspected, follow-up nuclear medicine hepatobiliary scan could be considered. 2. Otherwise unremarkable right upper quadrant ultrasound. Electronically Signed   By: Randa Ngo M.D.   On: 07/29/2020 22:51        Scheduled Meds: . carvedilol  6.25 mg Oral BID WC  . enoxaparin (LOVENOX) injection  40 mg Subcutaneous Daily  . fluticasone  1 spray Each Nare Daily  . hydrALAZINE  10 mg Oral BID  . insulin aspart  0-5 Units Subcutaneous QHS  . insulin aspart  0-9 Units Subcutaneous TID WC  . insulin glargine  15 Units Subcutaneous BID  . olopatadine  1 drop Both Eyes BID  . polyethylene glycol  17 g Oral BID   Continuous Infusions:   LOS: 0 days    Time spent: over 30 min    Fayrene Helper, MD [facility]   To contact the attending provider between 7A-7P or the covering provider during after hours 7P-7A, please log into the web site [email or web address] and access using universal [place] password for that web site. If you do not have the password, please call the hospital  operator.  07/30/2020, 3:32 PM

## 2020-07-30 NOTE — Progress Notes (Signed)
Pharmacy Antibiotic Note  [name]. is a 70 y.o. male admitted on 07/29/2020 with intra-abdominal infection.  Pharmacy has been consulted for Zosyn dosing. WBC elevated. Renal function good. Concern for cholecystitis.   Plan: Zosyn 3.375G IV q8h to be infused over 4 hours Trend WBC, temp, renal function  F/U infectious work-up   Height: 6\' 2"  (188 cm) Weight: 121.1 kg (267 lb) IBW/kg (Calculated) : 82.2  Temp (24hrs), Avg:97.4 F (36.3 C), Min:97.4 F (36.3 C), Max:97.4 F (36.3 C)  Recent Labs  Lab 07/29/20 2045 07/29/20 2212  WBC 14.9*  --   CREATININE 0.88  --   LATICACIDVEN  --  1.2    Estimated Creatinine Clearance: 109.6 mL/min (by C-G formula based on SCr of 0.88 mg/dL).    Allergies  Allergen Reactions  . Ace Inhibitors     Angioedema per pt report to admiting MD On 07/31/2019   . Ciprofloxacin Swelling  . Peanuts [Peanut Oil] Shortness Of Breath    Swelling throat, sob   . Tamiflu [Oseltamivir Phosphate] Shortness Of Breath  . Atorvastatin   . Citalopram   . Flagyl [Metronidazole]   . Gabapentin Swelling  . Lisinopril   . Losartan   . Lyrica [Pregabalin] Swelling  . Remeron [Mirtazapine]   . Simvastatin   . Statins Other (See Comments)    Leg problems.  . Victoza [Liraglutide]   . Avelox [Moxifloxacin Hcl In Nacl] Rash    Narda Bonds, PharmD, [place] Pharmacist Phone: [phone]

## 2020-07-30 NOTE — ED Notes (Signed)
Ambulated to restroom at this time 

## 2020-07-30 NOTE — Discharge Instructions (Signed)
Abdominal Pain, Adult Many things can cause belly (abdominal) pain. Most times, belly pain is not dangerous. Many cases of belly pain can be watched and treated at home. Sometimes, though, belly pain is serious. Your doctor will try to find the cause of your belly pain. Follow these instructions at home: Medicines  Take over-the-counter and prescription medicines only as told by your doctor.  Do not take medicines that help you poop (laxatives) unless told by your doctor. General instructions  Watch your belly pain for any changes.  Drink enough fluid to keep your pee (urine) pale yellow.  Keep all follow-up visits as told by your doctor. This is important.   Contact a doctor if:  Your belly pain changes or gets worse.  You are not hungry, or you lose weight without trying.  You are having trouble pooping (constipated) or have watery poop (diarrhea) for more than 2-3 days.  You have pain when you pee or poop.  Your belly pain wakes you up at night.  Your pain gets worse with meals, after eating, or with certain foods.  You are vomiting and cannot keep anything down.  You have a fever.  You have blood in your pee. Get help right away if:  Your pain does not go away as soon as your doctor says it should.  You cannot stop vomiting.  Your pain is only in areas of your belly, such as the right side or the left lower part of the belly.  You have bloody or black poop, or poop that looks like tar.  You have very bad pain, cramping, or bloating in your belly.  You have signs of not having enough fluid or water in your body (dehydration), such as: ? Dark pee, very little pee, or no pee. ? Cracked lips. ? Dry mouth. ? Sunken eyes. ? Sleepiness. ? Weakness.  You have trouble breathing or chest pain. Summary  Many cases of belly pain can be watched and treated at home.  Watch your belly pain for any changes.  Take over-the-counter and prescription medicines only as  told by your doctor.  Contact a doctor if your belly pain changes or gets worse.  Get help right away if you have very bad pain, cramping, or bloating in your belly. This information is not intended to replace advice given to you by your health care provider. Make sure you discuss any questions you have with your health care provider. Document Revised: 06/24/2018 Document Reviewed: 06/24/2018 Elsevier Patient Education  2021 [place] Dyskinesia  Biliary dyskinesia is a condition in which the gallbladder or bile ducts cannot release or move bile normally. Bile is a fluid that is made in the liver to help the body digest food. Bile flows to the gallbladder to be stored. When bile is needed for digestion, it leaves the gallbladder and flows through the bile ducts into the digestive tract. Biliary dyskinesia causes bile to build up, and that can cause pain in the abdomen. This condition may also be called:  Acalculous cholecystopathy.  Functional gallbladder disorder.  Sphincter of Oddi dysfunction. This is one type of biliary dyskinesia. What are the causes? The cause of biliary dyskinesia is poor function of the gallbladder. The exact reason this happens is often unknown. One reason may be changes in the gallbladder that are caused by obesity. What increases the risk? A person is more likely to develop this condition if his or her mother or father had it. It  may also develop if the person is:  Overweight.  Male.  71-62 years old. What are the signs or symptoms? The main symptom of this condition is pain in the upper right side of the abdomen. Typically, the pain:  Starts about 30 minutes after a meal, especially a meal that is spicy or greasy.  Lasts for 30 minutes or longer.  Builds up gradually until it is a steady pain that is severe enough to interrupt daily activities. Other symptoms may include:  Nausea or vomiting.  Sweating.  Cramping or bloating in  the abdomen.  Heartburn or belching.  Diarrhea. How is this diagnosed? This condition may be diagnosed based on your symptoms, your medical history, and a physical exam. You may have tests to rule out other conditions and to confirm the diagnosis. Tests may include:  Blood tests.  Ultrasound tests of the gallbladder.  Hepatobiliary iminodiacetic acid (HIDA) scan. This is an X-ray test that can show if your gallbladder empties less than a normal amount of bile (gallbladder ejection fraction).  MRI or CT scan of the abdomen.  ERCP (endoscopic retrograde cholangiopancreatogram). During this procedure, a thin tube with a camera on the end is inserted into the throat and down into areas that need to be examined, such as the pancreas, bile ducts, liver, and gallbladder. Dye is injected into your blood, and then X-rays are done. The dye helps your health care provider see the areas to be examined. ERCP may be done to help diagnose sphincter of Oddi dysfunction. How is this treated? Treatment depends on the cause. Usually, the first step of treatment is to make lifestyle changes. Your health care provider may recommend:  Resting.  Losing weight.  Avoiding foods that are spicy, greasy, or fatty.  Taking over-the-counter or prescription pain medicine. In some cases, the condition gets better with lifestyle changes only. However, in many cases, surgery to remove the gallbladder (cholecystectomy) is needed. Follow these instructions at home: Medicines  Take over-the-counter and prescription medicines only as told by your health care provider.  Ask your health care provider if the medicine prescribed to you: ? Requires you to avoid driving or using machinery. ? Can cause constipation. You may need to take these actions to prevent or treat constipation:  Drink enough fluid to keep your urine pale yellow.  Take over-the-counter or prescription medicines.  Eat foods that are high in fiber,  such as beans, whole grains, and fresh fruits and vegetables.  Limit foods that are high in fat and processed sugars, such as fried or sweet foods. Alcohol use Alcohol can irritate your stomach and your liver. If you drink alcohol:  Limit how much you have to: ? 0-1 drink a day for women who are not pregnant. ? 0-2 drinks a day for men.  Know how much alcohol is in a drink. In the [place] one drink equals one 12 oz bottle of beer (355 mL), one 5 oz glass of wine (148 mL), or one 1 oz glass of hard liquor (44 mL). General instructions  Rest and return to your normal activities as told by your health care provider. Ask your health care provider what activities are safe for you.  Follow instructions from your health care provider about eating or drinking restrictions. You may need to limit fatty, greasy, and spicy foods if they cause symptoms.  Do not use any products that contain nicotine or tobacco. These products include cigarettes, chewing tobacco, and vaping devices, such as e-cigarettes. If  you need help quitting, ask your health care provider. Smoking can damage your digestive system.  Keep all follow-up visits. This is important. Contact a health care provider if:  Pain in your abdomen returns.  Your symptoms become more severe or continue for more than 3 months.  You have any of the following: ? Nausea or vomiting. ? Diarrhea. ? Cramping or bloating in your abdomen. Summary  Biliary dyskinesia is a condition in which your gallbladder or bile ducts cannot release or move bile normally.  The main symptom of this condition is pain in the upper right side of the abdomen. The pain typically starts about 30 minutes after a meal, especially a meal that is spicy or greasy.  Treatment depends on the cause and usually involves lifestyle changes, such as working to lose weight and avoiding certain foods. In many cases, surgery to remove the gallbladder (cholecystectomy) is needed. This  information is not intended to replace advice given to you by your health care provider. Make sure you discuss any questions you have with your health care provider. Document Revised: 12/10/2019 Document Reviewed: 12/10/2019 Elsevier Patient Education  2021 [employer].

## 2020-07-31 DIAGNOSIS — R1011 Right upper quadrant pain: Secondary | ICD-10-CM | POA: Diagnosis not present

## 2020-07-31 LAB — COMPREHENSIVE METABOLIC PANEL
ALT: 23 U/L (ref 0–44)
AST: 19 U/L (ref 15–41)
Albumin: 3.3 g/dL — ABNORMAL LOW (ref 3.5–5.0)
Alkaline Phosphatase: 82 U/L (ref 38–126)
Anion gap: 8 (ref 5–15)
BUN: 10 mg/dL (ref 8–23)
CO2: 30 mmol/L (ref 22–32)
Calcium: 8.9 mg/dL (ref 8.9–10.3)
Chloride: 96 mmol/L — ABNORMAL LOW (ref 98–111)
Creatinine, Ser: 0.8 mg/dL (ref 0.61–1.24)
GFR, Estimated: >60 mL/min (ref >60)
Glucose, Bld: 167 mg/dL — ABNORMAL HIGH (ref 70–99)
Potassium: 3.6 mmol/L (ref 3.5–5.1)
Sodium: 134 mmol/L — ABNORMAL LOW (ref 135–145)
Total Bilirubin: 0.6 mg/dL (ref 0.3–1.2)
Total Protein: 6.3 g/dL — ABNORMAL LOW (ref 6.5–8.1)

## 2020-07-31 LAB — CBC WITH DIFFERENTIAL/PLATELET
Abs Immature Granulocytes: 0.04 10*3/uL (ref 0.00–0.07)
Basophils Absolute: 0.1 10*3/uL (ref 0.0–0.1)
Basophils Relative: 1 %
Eosinophils Absolute: 0.2 10*3/uL (ref 0.0–0.5)
Eosinophils Relative: 2 %
HCT: 41.4 % (ref 39.0–52.0)
Hemoglobin: 14.1 g/dL (ref 13.0–17.0)
Immature Granulocytes: 0 %
Lymphocytes Relative: 19 %
Lymphs Abs: 1.8 10*3/uL (ref 0.7–4.0)
MCH: 29.4 pg (ref 26.0–34.0)
MCHC: 34.1 g/dL (ref 30.0–36.0)
MCV: 86.3 fL (ref 80.0–100.0)
Monocytes Absolute: 0.8 10*3/uL (ref 0.1–1.0)
Monocytes Relative: 9 %
Neutro Abs: 6.4 10*3/uL (ref 1.7–7.7)
Neutrophils Relative %: 69 %
Platelets: 295 10*3/uL (ref 150–400)
RBC: 4.8 MIL/uL (ref 4.22–5.81)
RDW: 13.2 % (ref 11.5–15.5)
WBC: 9.3 10*3/uL (ref 4.0–10.5)
nRBC: 0 % (ref 0.0–0.2)

## 2020-07-31 LAB — MAGNESIUM: Magnesium: 2 mg/dL (ref 1.7–2.4)

## 2020-07-31 LAB — GLUCOSE, CAPILLARY: Glucose-Capillary: 156 mg/dL — ABNORMAL HIGH (ref 70–99)

## 2020-07-31 LAB — PHOSPHORUS: Phosphorus: 2.9 mg/dL (ref 2.5–4.6)

## 2020-07-31 NOTE — Discharge Summary (Signed)
Physician Discharge Summary  [name]. ZJQ:[id number] DOB: 01-Oct-1950 DOA: 07/29/2020  [facility]  Admit date: 07/29/2020 Discharge date: 07/31/2020  Time spent: 40 minutes  Recommendations for Outpatient Follow-up:  1. Follow outpatient CBC/CMP 2. Follow with general surgery/gastroenterology outpatient 3. Follow sodium level on HCTZ, if low again, may need to change to another agent   Discharge Diagnoses:  Active Problems:   RUQ abdominal pain   Discharge Condition: stable  Diet recommendation: heart healthy  Filed Weights   07/29/20 2033 07/30/20 0205  Weight: 121.1 kg 119.6 kg    History of present illness:  Alan Cruzis [name] 70 y.o.malewith medical history significant fordiverticulitis, last colonoscopy on 05/05/2020 by Dr. Carlean Purl, essential hypertension, hyperlipidemia, type 2 diabetes, chronic anxiety, who presented to [facility] ED due to sudden onset right upper quadrant severe pain hours prior to his presentation. Associated with nausea and one episode of vomiting. Denies any constitutional symptoms. He presented to the ED for further evaluation. CT abdomen and pelvis without contrast showed no specific evidence of inflammation of the gallbladder. Right upper quadrant abdominal ultrasound was alsoequivocal. Shlomo Seres HIDA scan has been ordered and is pending. TR[facility], hospitalist team, was asked to admit.  HIDA scan showed biliary dyskinesia.  Symptoms resolved on day of discharge.  Plan for outpatient GI/surgery follow up.    Hospital Course:  Right upper quadrant abdominal pain, rule out acute cholecystitis  Biliary Dyskinesia CT abdomen pelvis without contrast with slight indistinctness of gallbladder wall RUQ [place] with no sludge or cholelithiasis  HIDA without evidence of cholecystitis, but does have gallbladder dysfunction Will consult surgery with regards to the biliary dyskinesia -> recommend surgery and gastroenterology follow up Symptoms  essentially resolved on day of discharge, surgery noted GI evaluation prudent, will recommend this outpatient with resolution of his symptoms.   Resume diet for now with improvement in symptoms - doing better at this time  Hypovolemic hyponatremia, suspect contributed by home HCTZ. improved Resume HCTZ, will need to follow outpatient   Type 2 diabetes with hyperglycemia Obtain hemoglobin A1c 6.7 Resume home diabetes regimen    Essential hypertension BP is not at goal, elevated - continue to monitor outpatient  Resume home hydralazine, and Coreg. Resume HCTZ  Generalized anxiety Resume home hydroxyzine as needed  Procedures:  none  Consultations:  surgery  Discharge Exam: Vitals:   07/30/20 2200 07/31/20 0452  BP: (!) 159/66 (!) 161/68  Pulse: 73 68  Resp: 16 18  Temp: 98.5 F (36.9 C) 99.5 F (37.5 C)  SpO2: 95% 96%   Feeling better - still some mild Discussed discharge plan  General: No acute distress. Cardiovascular: Heart sounds show Duana Benedict regular rate, and rhythm.  Lungs: Clear to auscultation bilaterally  Abdomen: Soft, nontender, nondistended - mild RUQ discomfort  Neurological: Alert and oriented 3. Moves all extremities 4. Cranial nerves II through XII grossly intact. Skin: Warm and dry. No rashes or lesions. Extremities: No clubbing or cyanosis. No edema.    Discharge Instructions   Discharge Instructions    Call MD for:  difficulty breathing, headache or visual disturbances   Complete by: As directed    Call MD for:  extreme fatigue   Complete by: As directed    Call MD for:  hives   Complete by: As directed    Call MD for:  persistant dizziness or light-headedness   Complete by: As directed    Call MD for:  persistant nausea and vomiting   Complete by:  As directed    Call MD for:  redness, tenderness, or signs of infection (pain, swelling, redness, odor or green/yellow discharge around incision site)   Complete by: As directed    Call MD  for:  severe uncontrolled pain   Complete by: As directed    Call MD for:  temperature >100.4   Complete by: As directed    Diet - low sodium heart healthy   Complete by: As directed    Discharge instructions   Complete by: As directed    You were seen for right upper quadrant abdominal pain.  This is now resolved.  You had biliary dyskinesia (or dysfunction) on your HIDA scan.  Please follow up with general surgery as an outpatient to discuss further options and if you need Orli Degrave cholecystectomy outpatient if this is persistent or recurrent.  You should also follow up with gastroenterology as an outpatient for additional evaluation to see if you need any additional workup (as your symptoms were not totally typical of biliary dyskinesia).  We held your HCTZ while you were here due to low sodium.  You'll resume this at home, but you should get repeat labs within Sudie Bandel week to check your sodium and kidney function to make sure things are stable.  Long term, you may need adjustment or [place] [employer] new medicine if you continue to have low sodium levels (hyponatremia).  Continue to cut back on your alcohol use.  Follow up with your PCP to discuss further.   Return for new, recurrent, or worsening symptoms.  Please ask your PCP to request records from this hospitalization so they know what was done and what the next steps will be.   Increase activity slowly   Complete by: As directed      Allergies as of 07/31/2020      Reactions   Ace Inhibitors Other (See Comments)   Angioedema per pt report to admiting MD On 07/31/2019    Ciprofloxacin Swelling   Gabapentin Swelling   Other reaction(s): Influenza-like illness   Peanut Oil Shortness Of Breath   Swelling throat, sob  Other reaction(s): anaphylaxis   Tamiflu [oseltamivir Phosphate] Shortness Of Breath   Atorvastatin Other (See Comments)   Myalgias   Lisinopril Other (See Comments)   Other reaction(s): Muscle pain   Losartan Swelling   Other reaction(s): Lip  swelling   Lyrica [pregabalin] Swelling   Oxcarbazepine Swelling   Other reaction(s): Lip swelling   Simvastatin Other (See Comments)   Myalgias   Statins Other (See Comments)   Leg problems. Other reaction(s): muscle aches, Muscle pain   Flagyl [metronidazole] Other (See Comments)   Unknown reaction   Remeron [mirtazapine] Other (See Comments)   Unknown reaction   Victoza [liraglutide] Other (See Comments)   Unknown reaction   Avelox [moxifloxacin Hcl In Nacl] Rash   Citalopram Anxiety   Other reaction(s): Drowsy, Anxiety      Medication List    TAKE these medications   Azelastine HCl 137 MCG/SPRAY Soln Place 1 spray into both nostrils 2 (two) times daily.   carvedilol 25 MG tablet Commonly known as: COREG Take 25 mg by mouth 2 (two) times daily.   DIALYVITE VITAMIN D 5000 PO Take 5,000 Units by mouth every Monday, Wednesday, and Friday. Takes at night   felodipine 10 MG 24 hr tablet Commonly known as: PLENDIL Take 10 mg by mouth daily.   FISH OIL PO Take 1 capsule by mouth every evening.   hydrALAZINE 10 MG tablet Commonly  known as: APRESOLINE Take 10 mg by mouth 2 (two) times daily.   hydrochlorothiazide 25 MG tablet Commonly known as: HYDRODIURIL Take 25 mg by mouth every morning.   hydrOXYzine 25 MG capsule Commonly known as: VISTARIL Take 25 mg by mouth 2 (two) times daily as needed.   insulin aspart 100 UNIT/ML injection Commonly known as: novoLOG Inject 0-8 Units into the skin 3 (three) times daily as needed for high blood sugar.   insulin glargine 100 UNIT/ML injection Commonly known as: LANTUS Inject 15 Units into the skin 2 (two) times daily.   LACTOBACILLUS BIFIDUS PO Take 1 tablet by mouth daily.   magnesium oxide 400 MG tablet Commonly known as: MAG-OX Take 400 mg by mouth at bedtime.   multivitamin with minerals Tabs tablet Take 1 tablet by mouth every evening.   Naphazoline-Pheniramine 0.027-0.315 % Soln Apply 1 drop to eye daily  as needed (dry eyes).   neomycin-polymyxin-hydrocortisone OTIC solution Commonly known as: CORTISPORIN Place 4 drops into the right ear 4 (four) times daily.   OVER THE COUNTER MEDICATION Take 1 capsule by mouth at bedtime. Oil of Oregano   polyethylene glycol powder 17 GM/SCOOP powder Commonly known as: GLYCOLAX/MIRALAX Take 17 g by mouth at bedtime as needed for mild constipation or moderate constipation.   potassium gluconate 595 (99 K) MG Tabs tablet Take 595 mg by mouth every evening.   tadalafil 20 MG tablet Commonly known as: CIALIS Take 20 mg by mouth daily as needed for erectile dysfunction.   TURMERIC PO Take 1 tablet by mouth every evening.      Allergies  Allergen Reactions  . Ace Inhibitors Other (See Comments)    Angioedema per pt report to admiting MD On 07/31/2019   . Ciprofloxacin Swelling  . Gabapentin Swelling    Other reaction(s): Influenza-like illness  . Peanut Oil Shortness Of Breath    Swelling throat, sob  Other reaction(s): anaphylaxis  . Tamiflu [Oseltamivir Phosphate] Shortness Of Breath  . Atorvastatin Other (See Comments)    Myalgias  . Lisinopril Other (See Comments)    Other reaction(s): Muscle pain  . Losartan Swelling    Other reaction(s): Lip swelling  . Lyrica [Pregabalin] Swelling  . Oxcarbazepine Swelling    Other reaction(s): Lip swelling  . Simvastatin Other (See Comments)    Myalgias  . Statins Other (See Comments)    Leg problems. Other reaction(s): muscle aches, Muscle pain  . Flagyl [Metronidazole] Other (See Comments)    Unknown reaction  . Remeron [Mirtazapine] Other (See Comments)    Unknown reaction  . Victoza [Liraglutide] Other (See Comments)    Unknown reaction  . Avelox [Moxifloxacin Hcl In Nacl] Rash  . Citalopram Anxiety    Other reaction(s): Drowsy, Anxiety    Follow-up Information    [facility] [place] Follow up.   Specialty: General Surgery Why: call to schedule an appointment as needed for  biliary dyskinesia Contact information: [place] [address] [phone]                The results of significant diagnostics from this hospitalization (including imaging, microbiology, ancillary and laboratory) are listed below for reference.    Significant Diagnostic Studies: CT ABDOMEN PELVIS WO CONTRAST  Result Date: 07/29/2020 CLINICAL DATA:  Acute onset of right-sided abdominal pain. Kidney stone or appendicitis suspected EXAM: CT ABDOMEN AND PELVIS WITHOUT CONTRAST TECHNIQUE: Multidetector CT imaging of the abdomen and pelvis was performed following the standard protocol without IV  contrast. COMPARISON:  Abdominopelvic CT 09/07/2019 FINDINGS: Lower chest: Mild lower lobe atelectasis. Minimal bronchiectasis in the right lower lobe. Coronary artery calcifications. Mitral annulus calcifications. Normal heart size. Stable 4 mm left lower lobe pulmonary nodule, series 5, image 14. This is been unchanged since 2013 CT and is consistent with benign process. Hepatobiliary: No focal hepatic abnormality on noncontrast exam. Gallbladder is distended with slight indistinctness of gallbladder wall, series 3, image 36. No calcified gallstone. There is no biliary dilatation. Pancreas: No ductal dilatation or inflammation. Spleen: Normal in size without focal abnormality. Adrenals/Urinary Tract: Mild adrenal thickening without dominant adrenal nodule. Symmetric perinephric edema which is unchanged from prior exam. No renal or ureteral stones. No evidence of focal renal lesion. Partially distended urinary bladder without wall thickening or stone. Stomach/Bowel: Normal appendix, series 3, image 53. The stomach is decompressed. Small duodenal diverticulum. There is no small bowel obstruction or inflammation. Few fluid-filled loops of small bowel in the pelvis without wall thickening or inflammation. No terminal ileal inflammation. Small to moderate colonic stool burden. Descending  and sigmoid colonic diverticulosis. No diverticulitis or colonic inflammation. Sigmoid colon is redundant. Vascular/Lymphatic: Moderate aortic atherosclerosis. No aortic aneurysm. Few scattered periportal nodes are likely reactive. No enlarged lymph nodes in the abdomen or pelvis. Reproductive: Normal sized prostate gland.  Urolith. Other: No free air. No ascites. Tiny fat containing umbilical hernia. Fat within both inguinal canals, right greater than left. Musculoskeletal: Multilevel degenerative change throughout the spine. Mild degenerative change of both hips. There are no acute or suspicious osseous abnormalities. IMPRESSION: 1. Distended gallbladder with slight indistinctness of gallbladder wall, suspicious for acute cholecystitis. Recommend right upper quadrant ultrasound for further evaluation. 2. No renal stones or obstructive uropathy. Normal appendix. 3. Colonic diverticulosis without diverticulitis. Aortic Atherosclerosis (ICD10-I70.0). Electronically Signed   By: Keith Rake M.D.   On: 07/29/2020 21:52   NM Hepato W/EF  Result Date: 07/30/2020 CLINICAL DATA:  Right upper quadrant abdominal pain EXAM: NUCLEAR MEDICINE HEPATOBILIARY IMAGING WITH GALLBLADDER EF TECHNIQUE: Sequential images of the abdomen were obtained out to 60 minutes following intravenous administration of radiopharmaceutical. After oral ingestion of Ensure, gallbladder ejection fraction was determined. At 60 min, normal ejection fraction is greater than 33%. RADIOPHARMACEUTICALS:  5.4 mCi Tc-55m  Choletec IV COMPARISON:  CT and ultrasound 07/29/2020 FINDINGS: Prompt uptake and biliary excretion of activity by the liver is seen. Gallbladder activity is visualized, consistent with patency of cystic duct. Biliary activity passes into small bowel, consistent with patent common bile duct. Calculated gallbladder ejection fraction is 4%. (Normal gallbladder ejection fraction with Ensure is greater than 33%.) IMPRESSION: 1. Patency of  the cystic duct and common bile duct without scintigraphic evidence of cholecystitis. 2. Findings of gallbladder dysfunction with calculated ejection fraction of 4%. Electronically Signed   By: Davina Poke D.O.   On: 07/30/2020 11:16   US Abdomen Limited  Result Date: 07/29/2020 CLINICAL DATA:  Right-sided abdominal pain EXAM: ULTRASOUND ABDOMEN LIMITED RIGHT UPPER QUADRANT COMPARISON:  07/29/2020 FINDINGS: Gallbladder: No gallstones or biliary sludge. Mild gallbladder wall thickening measuring 4 mm, nonspecific. No pericholecystic fluid. Negative sonographic Murphy sign. Common bile duct: Diameter: 4 mm Liver: No focal lesion identified. Within normal limits in parenchymal echogenicity. Portal vein is patent on color Doppler imaging with normal direction of blood flow towards the liver. Other: None. IMPRESSION: 1. Nonspecific mild gallbladder wall thickening measuring 4 mm. No evidence of cholelithiasis or gallbladder sludge. If acute acalculous cholecystitis is suspected, follow-up nuclear medicine hepatobiliary scan could  be considered. 2. Otherwise unremarkable right upper quadrant ultrasound. Electronically Signed   By: Randa Ngo M.D.   On: 07/29/2020 22:51    Microbiology: Recent Results (from the past 240 hour(s))  SARS CORONAVIRUS 2 (TAT 6-24 HRS) Nasopharyngeal Nasopharyngeal Swab     Status: None   Collection Time: 07/29/20 10:25 PM   Specimen: Nasopharyngeal Swab  Result Value Ref Range Status   SARS Coronavirus 2 NEGATIVE NEGATIVE Final    Comment: (NOTE) SARS-CoV-2 target nucleic acids are NOT DETECTED.  The SARS-CoV-2 RNA is generally detectable in upper and lower respiratory specimens during the acute phase of infection. Negative results do not preclude SARS-CoV-2 infection, do not rule out co-infections with other pathogens, and should not be used as the sole basis for treatment or other patient management decisions. Negative results must be combined with clinical  observations, patient history, and epidemiological information. The expected result is Negative.  Fact Sheet for Patients: SugarRoll.be  Fact Sheet for Healthcare Providers: https://www.woods-mathews.com/  This test is not yet approved or cleared by the [place] FDA and  has been authorized for detection and/or diagnosis of SARS-CoV-2 by FDA under an Emergency Use Authorization (EUA). This EUA will remain  in effect (meaning this test can be used) for the duration of the COVID-19 declaration under Se ction 564(b)(1) of the Act, 21 U.S.C. section 360bbb-3(b)(1), unless the authorization is terminated or revoked sooner.  Performed at [facility], [place] [address], [place], [place] [address]      Labs: Basic Metabolic Panel: Recent Labs  Lab 07/29/20 2045 07/30/20 0523 07/31/20 0106  NA 129* 134* 134*  K 3.7 3.6 3.6  CL 91* 96* 96*  CO2 27 30 30   GLUCOSE 271* 170* 167*  BUN 15 9 10   CREATININE 0.88 0.80 0.80  CALCIUM 8.9 9.0 8.9  MG  --  2.0 2.0  PHOS  --  3.5 2.9   Liver Function Tests: Recent Labs  Lab 07/29/20 2045 07/30/20 0523 07/31/20 0106  AST 32 24 19  ALT 23 24 23   ALKPHOS 87 86 82  BILITOT 1.0 0.8 0.6  PROT 6.9 6.3* 6.3*  ALBUMIN 3.6 3.4* 3.3*   Recent Labs  Lab 07/29/20 2045  LIPASE 27   No results for input(s): AMMONIA in the last 168 hours. CBC: Recent Labs  Lab 07/29/20 2045 07/30/20 0523 07/31/20 0106  WBC 14.9* 9.6 9.3  NEUTROABS 12.9*  --  6.4  HGB 15.3 14.7 14.1  HCT 44.9 42.4 41.4  MCV 86.0 84.6 86.3  PLT 299 288 295   Cardiac Enzymes: No results for input(s): CKTOTAL, CKMB, CKMBINDEX, TROPONINI in the last 168 hours. BNP: BNP (last 3 results) No results for input(s): BNP in the last 8760 hours.  ProBNP (last 3 results) No results for input(s): PROBNP in the last 8760 hours.  CBG: Recent Labs  Lab 07/30/20 1120 07/30/20 1159 07/30/20 1644 07/30/20 2102  07/31/20 0602  GLUCAP 240* 210* 202* 192* 156*       Signed:  Fayrene Helper MD.  [facility] 07/31/2020, 8:37 AM

## 2020-08-04 LAB — CULTURE, BLOOD (ROUTINE X 2)
Culture: NO GROWTH
Culture: NO GROWTH
Special Requests: ADEQUATE

## 2020-08-20 ENCOUNTER — Ambulatory Visit: Payer: Self-pay | Admitting: Surgery

## 2020-08-20 NOTE — H&P (Signed)
History of Present Illness [name]. Alan Ausborn MD; 08/20/2020 5:49 PM) The patient is a 70 year old male who presents for evaluation of gall stones. Referred from the [facility] in [place] for biliary dyskinesia  This is a 70 year old male with DM, OSA, PVD, HTN, hyperlipidemia, sigmoid diverticulitis who presented to the ED on 07/29/20 with acute epigastric/ RUQ abdominal pain, nausea, vomiting, bloating.  He had no sign of gallstones, but there was some wall thickening of his GB.  HIDA scan showed a low GB EF of 4%.  He has had a few more milder episodes since then after eating greasy foods.  No diarrhea.  He presents now to discuss surgical cholecystectomy.  CLINICAL DATA:  Right-sided abdominal pain  EXAM: ULTRASOUND ABDOMEN LIMITED RIGHT UPPER QUADRANT  COMPARISON:  07/29/2020  FINDINGS: Gallbladder:  No gallstones or biliary sludge. Mild gallbladder wall thickening measuring 4 mm, nonspecific. No pericholecystic fluid. Negative sonographic Murphy sign.  Common bile duct:  Diameter: 4 mm  Liver:  No focal lesion identified. Within normal limits in parenchymal echogenicity. Portal vein is patent on color Doppler imaging with normal direction of blood flow towards the liver.  Other: None.  IMPRESSION: 1. Nonspecific mild gallbladder wall thickening measuring 4 mm. No evidence of cholelithiasis or gallbladder sludge. If acute acalculous cholecystitis is suspected, follow-up nuclear medicine hepatobiliary scan could be considered. 2. Otherwise unremarkable right upper quadrant ultrasound.   Electronically Signed  By: Randa Ngo M.D.  On: 07/29/2020 22:51  CLINICAL DATA:  Right upper quadrant abdominal pain  EXAM: NUCLEAR MEDICINE HEPATOBILIARY IMAGING WITH GALLBLADDER EF  TECHNIQUE: Sequential images of the abdomen were obtained out to 60 minutes following intravenous administration of radiopharmaceutical. After oral ingestion of Ensure, gallbladder ejection  fraction was determined. At 60 min, normal ejection fraction is greater than 33%.  RADIOPHARMACEUTICALS:  5.4 mCi Tc-53m Choletec IV  COMPARISON:  CT and ultrasound 07/29/2020  FINDINGS: Prompt uptake and biliary excretion of activity by the liver is seen. Gallbladder activity is visualized, consistent with patency of cystic duct. Biliary activity passes into small bowel, consistent with patent common bile duct.  Calculated gallbladder ejection fraction is 4%. (Normal gallbladder ejection fraction with Ensure is greater than 33%.)  IMPRESSION: 1. Patency of the cystic duct and common bile duct without scintigraphic evidence of cholecystitis. 2. Findings of gallbladder dysfunction with calculated ejection fraction of 4%.   Electronically Signed  By: NDavina PokeD.O.  On: 07/30/2020 11:16   Problem List/Past Medical (Rodman KeyK. 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)   Past Surgical History (Janeann Forehand CNA; 08/20/2020 11:04 AM) Colon Polyp Removal - Colonoscopy   Diagnostic Studies History (Janeann Forehand CNA; 08/20/2020 11:04 AM) Colonoscopy  within last year  Allergies (Janeann Forehand CNA; 08/20/2020 11:13 AM) No Known Drug Allergies  [08/20/2020]: (Marked as Inactive) Allergies Reconciled  ACE Inhibitors  Ciprofloxacin *FLUOROQUINOLONES*  Gabapentin-Naproxen Cmpd Kit *DERMATOLOGICALS*  Peanut Oil *CHEMICALS*  Tamiflu *ANTIVIRALS*  Atorvastatin Calcium *ANTIHYPERLIPIDEMICS*  Lisinopril *ANTIHYPERTENSIVES*  Losartan Potassium *ANTIHYPERTENSIVES*  Lyrica *ANTICONVULSANTS*  OXcarbazepine *ANTICONVULSANTS*  Simvastatin *ANTIHYPERLIPIDEMICS*  Statins Depletion *DIETARY PRODUCTS/DIETARY MANAGEMENT PRODUCTS*  Flagyl *ANTI-INFECTIVE AGENTS - MISC.*  Mirtazapine *ANTIDEPRESSANTS*  Victoza *ANTIDIABETICS*  Avelox *FLUOROQUINOLONES*  Citalopram & Diet Manage Prod *ANTIDEPRESSANTS*   Medication History (Janeann Forehand CNA; 08/20/2020 11:18  AM) Azelastine & Fluticasone  (137 & 50MCG/ACT Therapy Pack, Nasal) Active. Carvedilol  (3.125MG Tablet, Oral) Active. Cholecalciferol  (25MCG/0.03ML Liquid, Oral) Active. Felodipine  (2.5MG Tablet ER, Oral) Active. Hydralazine &  HCTZ  (25-15MG Tablet, Oral) Active. hydroCHLOROthiazide  (12.5MG Capsule, Oral) Active. hydrOXYzine HCl  (10MG/5ML Syrup, Oral) Active. Insulin Aspart  (100UNIT/ML Solution, Injection) Active. Insulin Glargine  (100UNIT/ML Soln Cartridge, Subcutaneous) Active. Lactobacillus  (1GM Granules, Oral) Active. Magnesium Oxide  (140MG Capsule, Oral) Active. Multivitamin  (Oral) Active. Naphazoline HCl  Active. Neomycin Sulfate  (0.5% Cream, External) Active. Omega 3  (340MG Capsule DR, Oral) Active. Polyethylene Glycol  (1000 Liquid, External) Active. Potassium Gluconate  (2.5MEQ Tablet, Oral) Active. Tadalafil  (2.5MG Tablet, Oral) Active. QC Tumeric Complex  (500MG Capsule, Oral) Active. Medications Reconciled   Social History (Donyelle Alston, CNA; 08/20/2020 11:04 AM) Alcohol use  Occasional alcohol use. Caffeine use  Coffee. Illicit drug use  Prefer to discuss with provider. Tobacco use  Former smoker.  Family History (Donyelle Alston, CNA; 08/20/2020 11:04 AM) Alcohol Abuse  Father. Hypertension  Mother. Respiratory Condition  Father.  Other Problems (Ellison Leisure K. Sanayah Munro, MD; 08/20/2020 5:49 PM) Anxiety Disorder  Diabetes Mellitus  Diverticulosis  Enlarged Prostate  Hemorrhoids  High blood pressure  Sleep Apnea      Review of Systems (Donyelle Alston CNA; 08/20/2020 11:04 AM) General Not Present- Appetite Loss, Chills, Fatigue, Fever, Night Sweats, Weight Gain and Weight Loss. Skin Not Present- Change in Wart/Mole, Dryness, Hives, Jaundice, New Lesions, Non-Healing Wounds, Rash and Ulcer. HEENT Present- Hearing Loss and Wears glasses/contact lenses. Not Present- Earache, Hoarseness, Nose Bleed, Oral Ulcers, Ringing in the Ears, Seasonal Allergies,  Sinus Pain, Sore Throat, Visual Disturbances and Yellow Eyes. Respiratory Not Present- Bloody sputum, Chronic Cough, Difficulty Breathing, Snoring and Wheezing. Cardiovascular Not Present- Chest Pain, Difficulty Breathing Lying Down, Leg Cramps, Palpitations, Rapid Heart Rate, Shortness of Breath and Swelling of Extremities. Gastrointestinal Present- Bloating, Constipation and Hemorrhoids. Not Present- Abdominal Pain, Bloody Stool, Change in Bowel Habits, Chronic diarrhea, Difficulty Swallowing, Excessive gas, Gets full quickly at meals, Indigestion, Nausea, Rectal Pain and Vomiting. Male Genitourinary Not Present- Blood in Urine, Change in Urinary Stream, Frequency, Impotence, Nocturia, Painful Urination, Urgency and Urine Leakage. Musculoskeletal Not Present- Back Pain, Joint Pain, Joint Stiffness, Muscle Pain, Muscle Weakness and Swelling of Extremities. Neurological Not Present- Decreased Memory, Fainting, Headaches, Numbness, Seizures, Tingling, Tremor, Trouble walking and Weakness. Psychiatric Not Present- Anxiety, Bipolar, Change in Sleep Pattern, Depression, Fearful and Frequent crying. Endocrine Not Present- Cold Intolerance, Excessive Hunger, Hair Changes, Heat Intolerance, Hot flashes and New Diabetes. Hematology Not Present- Blood Thinners, Easy Bruising, Excessive bleeding, Gland problems, HIV and Persistent Infections.  Vitals (Donyelle Alston CNA; 08/20/2020 11:06 AM) 08/20/2020 11:05 AM Weight: 268.5 lb   Height: 73.5 in  Body Surface Area: 2.45 m   Body Mass Index: 34.94 kg/m   Temp.: 97.6 F    Pulse: 61 (Regular)    P.OX: 97% (Room air) BP: 140/80(Sitting, Left Arm, Standard)        Physical Exam (Damany Eastman K. Cimone Fahey MD; 08/20/2020 5:49 PM)  The physical exam findings are as follows: Note: Constitutional: WDWN in NAD, conversant, no obvious deformities; resting comfortably Eyes: Pupils equal, round; sclera anicteric; moist conjunctiva; no lid lag HENT: Oral mucosa moist;  good dentition Neck: No masses palpated, trachea midline; no thyromegaly Lungs: CTA bilaterally; normal respiratory effort CV: Regular rate and rhythm; no murmurs; extremities well-perfused with no edema Abd: +bowel sounds, soft, protuberant, non-tender, no palpable organomegaly; no palpable hernias Musc: Normal gait; no apparent clubbing or cyanosis in extremities Lymphatic: No palpable cervical or axillary lymphadenopathy Skin: Warm, dry; no sign of jaundice Psychiatric - alert and oriented x   4; calm mood and affect    Assessment & Plan Rodman Key K. 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  The surgical procedure has been discussed with the patient.  Potential risks, benefits, alternative treatments, and expected outcomes have been explained.  All of the patient's questions at this time have been answered.  The likelihood of reaching the patient's treatment goal is good.  The patient understand the proposed surgical procedure and wishes to proceed.  [name]. Georgette Dover, MD, [facility] Surgery  General/ Trauma Surgery   08/20/2020 5:50 PM

## 2020-08-20 NOTE — H&P (View-Only) (Signed)
History of Present Illness (Quatavious Rossa K. Tyra Gural MD; 08/20/2020 5:49 PM) The patient is a 70 year old male who presents for evaluation of gall stones. [facility] [place] [facility]  This is a 70 year old male with DM, OSA, PVD, HTN, hyperlipidemia, sigmoid diverticulitis who presented to the ED on 07/29/20 with acute epigastric/ RUQ abdominal pain, nausea, vomiting, bloating.  He had no sign of gallstones, but there was some wall thickening of his GB.  HIDA scan showed a low GB EF of 4%.  He has had a few more milder episodes since then after eating greasy foods.  No diarrhea.  He presents now to discuss surgical cholecystectomy.  CLINICAL DATA:  Right-sided abdominal pain  EXAM: ULTRASOUND ABDOMEN LIMITED RIGHT UPPER QUADRANT  COMPARISON:  07/29/2020  FINDINGS: Gallbladder:  No gallstones or biliary sludge. Mild gallbladder wall thickening measuring 4 mm, nonspecific. No pericholecystic fluid. Negative sonographic Murphy sign.  Common bile duct:  Diameter: 4 mm  Liver:  No focal lesion identified. Within normal limits in parenchymal echogenicity. Portal vein is patent on color Doppler imaging with normal direction of blood flow towards the liver.  Other: None.  IMPRESSION: 1. Nonspecific mild gallbladder wall thickening measuring 4 mm. No evidence of cholelithiasis or gallbladder sludge. If acute acalculous cholecystitis is suspected, follow-up nuclear medicine hepatobiliary scan could be considered. 2. Otherwise unremarkable right upper quadrant ultrasound.   Electronically Signed  By: Michael  Brown M.D.  On: 07/29/2020 22:51  CLINICAL DATA:  Right upper quadrant abdominal pain  EXAM: NUCLEAR MEDICINE HEPATOBILIARY IMAGING WITH GALLBLADDER EF  TECHNIQUE: Sequential images of the abdomen were obtained out to 60 minutes following intravenous administration of radiopharmaceutical. After oral ingestion of Ensure, gallbladder ejection  fraction was determined. At 60 min, normal ejection fraction is greater than 33%.  RADIOPHARMACEUTICALS:  5.4 mCi Tc-99m  Choletec IV  COMPARISON:  CT and ultrasound 07/29/2020  FINDINGS: Prompt uptake and biliary excretion of activity by the liver is seen. Gallbladder activity is visualized, consistent with patency of cystic duct. Biliary activity passes into small bowel, consistent with patent common bile duct.  Calculated gallbladder ejection fraction is 4%. (Normal gallbladder ejection fraction with Ensure is greater than 33%.)  IMPRESSION: 1. Patency of the cystic duct and common bile duct without scintigraphic evidence of cholecystitis. 2. Findings of gallbladder dysfunction with calculated ejection fraction of 4%.   Electronically Signed  By: Nicholas  Plundo D.O.  On: 07/30/2020 11:16   Problem List/Past Medical (Kristi Hyer K. 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)   Past Surgical History (Donyelle Alston, CNA; 08/20/2020 11:04 AM) Colon Polyp Removal - Colonoscopy   Diagnostic Studies History (Donyelle Alston, CNA; 08/20/2020 11:04 AM) Colonoscopy  within last year  Allergies (Donyelle Alston, CNA; 08/20/2020 11:13 AM) No Known Drug Allergies  [08/20/2020]: (Marked as Inactive) Allergies Reconciled  ACE Inhibitors  Ciprofloxacin *FLUOROQUINOLONES*  Gabapentin-Naproxen Cmpd Kit *DERMATOLOGICALS*  Peanut Oil *CHEMICALS*  Tamiflu *ANTIVIRALS*  Atorvastatin Calcium *ANTIHYPERLIPIDEMICS*  Lisinopril *ANTIHYPERTENSIVES*  Losartan Potassium *ANTIHYPERTENSIVES*  Lyrica *ANTICONVULSANTS*  OXcarbazepine *ANTICONVULSANTS*  Simvastatin *ANTIHYPERLIPIDEMICS*  Statins Depletion *DIETARY PRODUCTS/DIETARY MANAGEMENT PRODUCTS*  Flagyl *ANTI-INFECTIVE AGENTS - MISC.*  Mirtazapine *ANTIDEPRESSANTS*  Victoza *ANTIDIABETICS*  Avelox *FLUOROQUINOLONES*  Citalopram & Diet Manage Prod *ANTIDEPRESSANTS*   Medication History (Donyelle Alston, CNA; 08/20/2020 11:18  AM) Azelastine & Fluticasone  (137 & 50MCG/ACT Therapy Pack, Nasal) Active. Carvedilol  (3.125MG Tablet, Oral) Active. Cholecalciferol  (25MCG/0.03ML Liquid, Oral) Active. Felodipine  (2.5MG Tablet ER, Oral) Active. Hydralazine &   HCTZ  (25-15MG Tablet, Oral) Active. hydroCHLOROthiazide  (12.5MG Capsule, Oral) Active. hydrOXYzine HCl  (10MG/5ML Syrup, Oral) Active. Insulin Aspart  (100UNIT/ML Solution, Injection) Active. Insulin Glargine  (100UNIT/ML Soln Cartridge, Subcutaneous) Active. Lactobacillus  (1GM Granules, Oral) Active. Magnesium Oxide  (140MG Capsule, Oral) Active. Multivitamin  (Oral) Active. Naphazoline HCl  Active. Neomycin Sulfate  (0.5% Cream, External) Active. Omega 3  (340MG Capsule DR, Oral) Active. Polyethylene Glycol  (1000 Liquid, External) Active. Potassium Gluconate  (2.5MEQ Tablet, Oral) Active. Tadalafil  (2.5MG Tablet, Oral) Active. QC Tumeric Complex  (500MG Capsule, Oral) Active. Medications Reconciled   Social History (Donyelle Alston, CNA; 08/20/2020 11:04 AM) Alcohol use  Occasional alcohol use. Caffeine use  Coffee. Illicit drug use  Prefer to discuss with provider. Tobacco use  Former smoker.  Family History (Donyelle Alston, CNA; 08/20/2020 11:04 AM) Alcohol Abuse  Father. Hypertension  Mother. Respiratory Condition  Father.  Other Problems (Akeelah Seppala K. Taisei Bonnette, MD; 08/20/2020 5:49 PM) Anxiety Disorder  Diabetes Mellitus  Diverticulosis  Enlarged Prostate  Hemorrhoids  High blood pressure  Sleep Apnea      Review of Systems (Donyelle Alston CNA; 08/20/2020 11:04 AM) General Not Present- Appetite Loss, Chills, Fatigue, Fever, Night Sweats, Weight Gain and Weight Loss. Skin Not Present- Change in Wart/Mole, Dryness, Hives, Jaundice, New Lesions, Non-Healing Wounds, Rash and Ulcer. HEENT Present- Hearing Loss and Wears glasses/contact lenses. Not Present- Earache, Hoarseness, Nose Bleed, Oral Ulcers, Ringing in the Ears, Seasonal Allergies,  Sinus Pain, Sore Throat, Visual Disturbances and Yellow Eyes. Respiratory Not Present- Bloody sputum, Chronic Cough, Difficulty Breathing, Snoring and Wheezing. Cardiovascular Not Present- Chest Pain, Difficulty Breathing Lying Down, Leg Cramps, Palpitations, Rapid Heart Rate, Shortness of Breath and Swelling of Extremities. Gastrointestinal Present- Bloating, Constipation and Hemorrhoids. Not Present- Abdominal Pain, Bloody Stool, Change in Bowel Habits, Chronic diarrhea, Difficulty Swallowing, Excessive gas, Gets full quickly at meals, Indigestion, Nausea, Rectal Pain and Vomiting. Male Genitourinary Not Present- Blood in Urine, Change in Urinary Stream, Frequency, Impotence, Nocturia, Painful Urination, Urgency and Urine Leakage. Musculoskeletal Not Present- Back Pain, Joint Pain, Joint Stiffness, Muscle Pain, Muscle Weakness and Swelling of Extremities. Neurological Not Present- Decreased Memory, Fainting, Headaches, Numbness, Seizures, Tingling, Tremor, Trouble walking and Weakness. Psychiatric Not Present- Anxiety, Bipolar, Change in Sleep Pattern, Depression, Fearful and Frequent crying. Endocrine Not Present- Cold Intolerance, Excessive Hunger, Hair Changes, Heat Intolerance, Hot flashes and New Diabetes. Hematology Not Present- Blood Thinners, Easy Bruising, Excessive bleeding, Gland problems, HIV and Persistent Infections.  Vitals (Donyelle Alston CNA; 08/20/2020 11:06 AM) 08/20/2020 11:05 AM Weight: 268.5 lb   Height: 73.5 in  Body Surface Area: 2.45 m   Body Mass Index: 34.94 kg/m   Temp.: 97.6 F    Pulse: 61 (Regular)    P.OX: 97% (Room air) BP: 140/80(Sitting, Left Arm, Standard)        Physical Exam (Champayne Kocian K. Verlisa Vara MD; 08/20/2020 5:49 PM)  The physical exam findings are as follows: Note: Constitutional: WDWN in NAD, conversant, no obvious deformities; resting comfortably Eyes: Pupils equal, round; sclera anicteric; moist conjunctiva; no lid lag HENT: Oral mucosa moist;  good dentition Neck: No masses palpated, trachea midline; no thyromegaly Lungs: CTA bilaterally; normal respiratory effort CV: Regular rate and rhythm; no murmurs; extremities well-perfused with no edema Abd: +bowel sounds, soft, protuberant, non-tender, no palpable organomegaly; no palpable hernias Musc: Normal gait; no apparent clubbing or cyanosis in extremities Lymphatic: No palpable cervical or axillary lymphadenopathy Skin: Warm, dry; no sign of jaundice Psychiatric - alert and oriented x   4; calm mood and affect    Assessment & Plan (Sequita Wise K. 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  The surgical procedure has been discussed with the patient.  Potential risks, benefits, alternative treatments, and expected outcomes have been explained.  All of the patient's questions at this time have been answered.  The likelihood of reaching the patient's treatment goal is good.  The patient understand the proposed surgical procedure and wishes to proceed.  Hayden Mabin K. Nakya Weyand, MD, FACS [facility]  General/ Trauma Surgery   08/20/2020 5:50 PM   

## 2020-08-27 DIAGNOSIS — M1712 Unilateral primary osteoarthritis, left knee: Secondary | ICD-10-CM | POA: Diagnosis not present

## 2020-08-27 DIAGNOSIS — M25561 Pain in right knee: Secondary | ICD-10-CM | POA: Diagnosis not present

## 2020-08-27 DIAGNOSIS — M2392 Unspecified internal derangement of left knee: Secondary | ICD-10-CM | POA: Diagnosis not present

## 2020-08-31 ENCOUNTER — Encounter (HOSPITAL_COMMUNITY): Payer: Self-pay | Admitting: Surgery

## 2020-08-31 NOTE — Progress Notes (Addendum)
COVID Vaccine Completed: Date COVID Vaccine completed: Has received booster: COVID vaccine manufacturer: [place]  Date of COVID positive in last 90 days:  PCP - [facility] -   Chest x-ray - greater than 1 year in epic EKG - 07/30/2020 in epic Stress Test -  ECHO - 09/08/2019 in epic Cardiac Cath -  Pacemaker/ICD device last checked:  Sleep Study -  CPAP -   Fasting Blood Sugar -  Checks Blood Sugar _____ times a day Hgb A1C 7.3 on 07/29/20 in epic  Blood Thinner Instructions: Aspirin Instructions: Last Dose:  Activity level:  Unable to go up a flight of stairs without symptoms   Can go up a flight of stairs and activities of daily living without stopping and without symptoms   Able to exercise without symptoms     Anesthesia review:   Patient denies shortness of breath, fever, cough and chest pain at PAT appointment   Patient verbalized understanding of instructions that were given to them at the PAT appointment. Patient was also instructed that they will need to review over the PAT instructions again at home before surgery.

## 2020-09-02 ENCOUNTER — Other Ambulatory Visit: Payer: Self-pay

## 2020-09-02 ENCOUNTER — Encounter (HOSPITAL_COMMUNITY): Payer: Self-pay | Admitting: Surgery

## 2020-09-02 NOTE — Progress Notes (Addendum)
COVID Vaccine Completed:yes Date COVID Vaccine completed: Has received booster:2 COVID vaccine manufacturer: Pfizer      Date of COVID positive in last 6 days:NO  PCP - [facility] - [facility] Ardeen [facility]  Chest x-ray - greater than 1 year in epic EKG - 07/30/2020 in epic Stress Test -  ECHO - 09/08/2019 in epic Cardiac Cath -  Pacemaker/ICD device last checked:  Sleep Study -  CPAP - yes  Fasting Blood Sugar -  Checks Blood Sugar _____ times a day Hgb A1C 7.3 on 07/29/20 in epic  Blood Thinner Instructions: Aspirin Instructions: Last Dose:  Activity level:   Can go up a flight of stairs and activities of daily living without stopping and without symptoms   Able to exercise without symptoms     Anesthesia review:   Patient denies shortness of breath, fever, cough and chest pain at PAT appointment   Patient verbalized understanding of instructions that were given to them at the PAT appointment. Patient was also instructed that they will need to review over the PAT instructions again at home before surgery.

## 2020-09-08 ENCOUNTER — Ambulatory Visit (HOSPITAL_COMMUNITY): Payer: No Typology Code available for payment source

## 2020-09-08 ENCOUNTER — Ambulatory Visit (HOSPITAL_COMMUNITY): Payer: No Typology Code available for payment source | Admitting: Anesthesiology

## 2020-09-08 ENCOUNTER — Ambulatory Visit (HOSPITAL_COMMUNITY)
Admission: RE | Admit: 2020-09-08 | Discharge: 2020-09-08 | Disposition: A | Discharge status: Home / Self Care | Payer: No Typology Code available for payment source | Attending: Surgery | Admitting: Surgery

## 2020-09-08 ENCOUNTER — Encounter (HOSPITAL_COMMUNITY)
Admission: RE | Disposition: A | Discharge status: Home / Self Care | Payer: Self-pay | Source: Home / Self Care | Attending: Surgery

## 2020-09-08 ENCOUNTER — Encounter (HOSPITAL_COMMUNITY): Payer: Self-pay | Admitting: Surgery

## 2020-09-08 DIAGNOSIS — I10 Essential (primary) hypertension: Secondary | ICD-10-CM | POA: Diagnosis not present

## 2020-09-08 DIAGNOSIS — G4733 Obstructive sleep apnea (adult) (pediatric): Secondary | ICD-10-CM | POA: Insufficient documentation

## 2020-09-08 DIAGNOSIS — Z881 Allergy status to other antibiotic agents status: Secondary | ICD-10-CM | POA: Insufficient documentation

## 2020-09-08 DIAGNOSIS — K8010 Calculus of gallbladder with chronic cholecystitis without obstruction: Secondary | ICD-10-CM | POA: Diagnosis not present

## 2020-09-08 DIAGNOSIS — Z79899 Other long term (current) drug therapy: Secondary | ICD-10-CM | POA: Diagnosis not present

## 2020-09-08 DIAGNOSIS — I739 Peripheral vascular disease, unspecified: Secondary | ICD-10-CM | POA: Diagnosis not present

## 2020-09-08 DIAGNOSIS — E119 Type 2 diabetes mellitus without complications: Secondary | ICD-10-CM | POA: Diagnosis not present

## 2020-09-08 DIAGNOSIS — E785 Hyperlipidemia, unspecified: Secondary | ICD-10-CM | POA: Diagnosis not present

## 2020-09-08 DIAGNOSIS — K828 Other specified diseases of gallbladder: Secondary | ICD-10-CM | POA: Diagnosis present

## 2020-09-08 DIAGNOSIS — Z794 Long term (current) use of insulin: Secondary | ICD-10-CM | POA: Diagnosis not present

## 2020-09-08 DIAGNOSIS — Z888 Allergy status to other drugs, medicaments and biological substances status: Secondary | ICD-10-CM | POA: Diagnosis not present

## 2020-09-08 DIAGNOSIS — Z87891 Personal history of nicotine dependence: Secondary | ICD-10-CM | POA: Insufficient documentation

## 2020-09-08 HISTORY — DX: Unspecified right bundle-branch block: I45.10

## 2020-09-08 HISTORY — PX: CHOLECYSTECTOMY: SHX55

## 2020-09-08 HISTORY — DX: Myoneural disorder, unspecified: G70.9

## 2020-09-08 LAB — CBC
HCT: 45.3 % (ref 39.0–52.0)
Hemoglobin: 15.4 g/dL (ref 13.0–17.0)
MCH: 29.3 pg (ref 26.0–34.0)
MCHC: 34 g/dL (ref 30.0–36.0)
MCV: 86.1 fL (ref 80.0–100.0)
Platelets: 319 10*3/uL (ref 150–400)
RBC: 5.26 MIL/uL (ref 4.22–5.81)
RDW: 13.2 % (ref 11.5–15.5)
WBC: 10.2 10*3/uL (ref 4.0–10.5)
nRBC: 0 % (ref 0.0–0.2)

## 2020-09-08 LAB — BASIC METABOLIC PANEL
Anion gap: 8 (ref 5–15)
BUN: 19 mg/dL (ref 8–23)
CO2: 28 mmol/L (ref 22–32)
Calcium: 9.1 mg/dL (ref 8.9–10.3)
Chloride: 96 mmol/L — ABNORMAL LOW (ref 98–111)
Creatinine, Ser: 0.86 mg/dL (ref 0.61–1.24)
GFR, Estimated: >60 mL/min (ref >60)
Glucose, Bld: 144 mg/dL — ABNORMAL HIGH (ref 70–99)
Potassium: 4 mmol/L (ref 3.5–5.1)
Sodium: 132 mmol/L — ABNORMAL LOW (ref 135–145)

## 2020-09-08 LAB — GLUCOSE, CAPILLARY
Glucose-Capillary: 145 mg/dL — ABNORMAL HIGH (ref 70–99)
Glucose-Capillary: 145 mg/dL — ABNORMAL HIGH (ref 70–99)

## 2020-09-08 SURGERY — LAPAROSCOPIC CHOLECYSTECTOMY WITH INTRAOPERATIVE CHOLANGIOGRAM
Anesthesia: General | Site: Abdomen

## 2020-09-08 MED ORDER — ONDANSETRON HCL 4 MG/2ML IJ SOLN
4.0000 mg | Freq: Once | INTRAMUSCULAR | Status: AC | PRN
  Administered 2020-09-08: 4 mg via INTRAVENOUS

## 2020-09-08 MED ORDER — CHLORHEXIDINE GLUCONATE CLOTH 2 % EX PADS: 6.0000 | MEDICATED_PAD | Freq: Once | CUTANEOUS | Status: DC

## 2020-09-08 MED ORDER — BUPIVACAINE-EPINEPHRINE 0.25% -1:200000 IJ SOLN
INTRAMUSCULAR | Status: DC | PRN
  Administered 2020-09-08: 16 mL

## 2020-09-08 MED ORDER — OXYCODONE HCL 5 MG PO TABS: 5.0000 mg | ORAL_TABLET | Freq: Four times a day (QID) | ORAL | 0 refills | Status: DC | PRN

## 2020-09-08 MED ORDER — ROCURONIUM BROMIDE 10 MG/ML (PF) SYRINGE
PREFILLED_SYRINGE | INTRAVENOUS | Status: AC
  Filled 2020-09-08: qty 10

## 2020-09-08 MED ORDER — SODIUM CHLORIDE 0.9 % IV SOLN
2.0000 g | INTRAVENOUS | Status: AC
  Administered 2020-09-08: 2 g via INTRAVENOUS

## 2020-09-08 MED ORDER — HYDROMORPHONE HCL 1 MG/ML IJ SOLN
0.2500 mg | INTRAMUSCULAR | Status: DC | PRN
Start: 2020-09-08 — End: 2020-09-08
  Administered 2020-09-08 (×3): 0.5 mg via INTRAVENOUS

## 2020-09-08 MED ORDER — OXYCODONE HCL 5 MG/5ML PO SOLN
5.0000 mg | Freq: Once | ORAL | Status: AC | PRN
Start: 2020-09-08 — End: 2020-09-08

## 2020-09-08 MED ORDER — GLYCOPYRROLATE PF 0.2 MG/ML IJ SOSY
PREFILLED_SYRINGE | INTRAMUSCULAR | Status: DC | PRN
  Administered 2020-09-08 (×2): .1 mg via INTRAVENOUS

## 2020-09-08 MED ORDER — ORAL CARE MOUTH RINSE: 15.0000 mL | Freq: Once | OROMUCOSAL | Status: AC

## 2020-09-08 MED ORDER — FENTANYL CITRATE (PF) 100 MCG/2ML IJ SOLN
INTRAMUSCULAR | Status: AC
  Filled 2020-09-08: qty 2

## 2020-09-08 MED ORDER — ROCURONIUM BROMIDE 10 MG/ML (PF) SYRINGE
PREFILLED_SYRINGE | INTRAVENOUS | Status: DC | PRN
  Administered 2020-09-08: 80 mg via INTRAVENOUS
  Administered 2020-09-08: 20 mg via INTRAVENOUS

## 2020-09-08 MED ORDER — ONDANSETRON HCL 4 MG/2ML IJ SOLN
INTRAMUSCULAR | Status: AC
  Filled 2020-09-08: qty 2

## 2020-09-08 MED ORDER — ACETAMINOPHEN 500 MG PO TABS
1000.0000 mg | ORAL_TABLET | ORAL | Status: AC
  Administered 2020-09-08: 1000 mg via ORAL
  Filled 2020-09-08: qty 2

## 2020-09-08 MED ORDER — DEXAMETHASONE SODIUM PHOSPHATE 10 MG/ML IJ SOLN
INTRAMUSCULAR | Status: DC | PRN
  Administered 2020-09-08: 10 mg via INTRAVENOUS

## 2020-09-08 MED ORDER — BUPIVACAINE-EPINEPHRINE (PF) 0.25% -1:200000 IJ SOLN
INTRAMUSCULAR | Status: AC
  Filled 2020-09-08: qty 30

## 2020-09-08 MED ORDER — PROPOFOL 10 MG/ML IV BOLUS
INTRAVENOUS | Status: DC | PRN
  Administered 2020-09-08: 160 mg via INTRAVENOUS

## 2020-09-08 MED ORDER — HYDROMORPHONE HCL 1 MG/ML IJ SOLN
INTRAMUSCULAR | Status: AC
  Administered 2020-09-08: 0.5 mg via INTRAVENOUS
  Filled 2020-09-08: qty 2

## 2020-09-08 MED ORDER — ONDANSETRON HCL 4 MG/2ML IJ SOLN
INTRAMUSCULAR | Status: DC | PRN
  Administered 2020-09-08: 4 mg via INTRAVENOUS

## 2020-09-08 MED ORDER — DEXAMETHASONE SODIUM PHOSPHATE 10 MG/ML IJ SOLN
INTRAMUSCULAR | Status: AC
  Filled 2020-09-08: qty 1

## 2020-09-08 MED ORDER — LIDOCAINE 2% (20 MG/ML) 5 ML SYRINGE
INTRAMUSCULAR | Status: DC | PRN
  Administered 2020-09-08: 100 mg via INTRAVENOUS

## 2020-09-08 MED ORDER — OXYCODONE HCL 5 MG PO TABS: 5.0000 mg | ORAL_TABLET | Freq: Once | ORAL | Status: AC | PRN

## 2020-09-08 MED ORDER — LABETALOL HCL 5 MG/ML IV SOLN
INTRAVENOUS | Status: DC | PRN
  Administered 2020-09-08 (×2): 5 mg via INTRAVENOUS

## 2020-09-08 MED ORDER — LACTATED RINGERS IV SOLN
INTRAVENOUS | Status: DC | PRN
  Administered 2020-09-08: 1000 mL

## 2020-09-08 MED ORDER — KETOROLAC TROMETHAMINE 30 MG/ML IJ SOLN
30.0000 mg | Freq: Once | INTRAMUSCULAR | Status: AC | PRN
  Administered 2020-09-08: 30 mg via INTRAVENOUS

## 2020-09-08 MED ORDER — SODIUM CHLORIDE 0.9 % IV SOLN
INTRAVENOUS | Status: AC
  Filled 2020-09-08: qty 2

## 2020-09-08 MED ORDER — OXYCODONE HCL 5 MG PO TABS
ORAL_TABLET | ORAL | Status: AC
  Administered 2020-09-08: 5 mg via ORAL
  Filled 2020-09-08: qty 1

## 2020-09-08 MED ORDER — PROPOFOL 10 MG/ML IV BOLUS
INTRAVENOUS | Status: AC
  Filled 2020-09-08: qty 20

## 2020-09-08 MED ORDER — SODIUM CHLORIDE 0.9 % IV SOLN
INTRAVENOUS | Status: DC | PRN
  Administered 2020-09-08: 4 mL

## 2020-09-08 MED ORDER — FENTANYL CITRATE (PF) 100 MCG/2ML IJ SOLN
INTRAMUSCULAR | Status: DC | PRN
  Administered 2020-09-08 (×4): 50 ug via INTRAVENOUS

## 2020-09-08 MED ORDER — SUGAMMADEX SODIUM 500 MG/5ML IV SOLN
INTRAVENOUS | Status: AC
  Filled 2020-09-08: qty 5

## 2020-09-08 MED ORDER — LACTATED RINGERS IV SOLN: INTRAVENOUS | Status: DC

## 2020-09-08 MED ORDER — SUGAMMADEX SODIUM 200 MG/2ML IV SOLN
INTRAVENOUS | Status: DC | PRN
  Administered 2020-09-08: 300 mg via INTRAVENOUS

## 2020-09-08 MED ORDER — LIDOCAINE 2% (20 MG/ML) 5 ML SYRINGE
INTRAMUSCULAR | Status: AC
  Filled 2020-09-08: qty 5

## 2020-09-08 MED ORDER — KETOROLAC TROMETHAMINE 30 MG/ML IJ SOLN
INTRAMUSCULAR | Status: AC
  Filled 2020-09-08: qty 1

## 2020-09-08 MED ORDER — CHLORHEXIDINE GLUCONATE 0.12 % MT SOLN
15.0000 mL | Freq: Once | OROMUCOSAL | Status: AC
  Administered 2020-09-08: 15 mL via OROMUCOSAL

## 2020-09-08 SURGICAL SUPPLY — 45 items
APPLIER CLIP ROT 10 11.4 M/L (STAPLE) ×2
BAG COUNTER SPONGE SURGICOUNT (BAG) IMPLANT
BENZOIN TINCTURE PRP APPL 2/3 (GAUZE/BANDAGES/DRESSINGS) ×2 IMPLANT
CHLORAPREP W/TINT 26 (MISCELLANEOUS) ×2 IMPLANT
CLIP APPLIE ROT 10 11.4 M/L (STAPLE) ×1 IMPLANT
CNTNR URN SCR LID CUP LEK RST (MISCELLANEOUS) ×1 IMPLANT
CONT SPEC 4OZ STRL OR WHT (MISCELLANEOUS) ×2
COVER MAYO STAND STRL (DRAPES) ×2 IMPLANT
COVER SURGICAL LIGHT HANDLE (MISCELLANEOUS) ×2 IMPLANT
DECANTER SPIKE VIAL GLASS SM (MISCELLANEOUS) ×2 IMPLANT
DRAPE C-ARM 42X120 X-RAY (DRAPES) ×2 IMPLANT
DRAPE SHEET LG 3/4 BI-LAMINATE (DRAPES) ×2 IMPLANT
DRSG TEGADERM 2-3/8X2-3/4 SM (GAUZE/BANDAGES/DRESSINGS) ×6 IMPLANT
DRSG TEGADERM 4X4.75 (GAUZE/BANDAGES/DRESSINGS) ×2 IMPLANT
ELECT REM PT RETURN 15FT ADLT (MISCELLANEOUS) ×2 IMPLANT
GAUZE 4X4 16PLY ~~LOC~~+RFID DBL (SPONGE) ×2 IMPLANT
GAUZE SPONGE 2X2 8PLY STRL LF (GAUZE/BANDAGES/DRESSINGS) ×1 IMPLANT
GLOVE SURG ENC MOIS LTX SZ7 (GLOVE) ×2 IMPLANT
GLOVE SURG UNDER POLY LF SZ7.5 (GLOVE) ×2 IMPLANT
GOWN STRL REUS W/TWL LRG LVL3 (GOWN DISPOSABLE) ×2 IMPLANT
GOWN STRL REUS W/TWL XL LVL3 (GOWN DISPOSABLE) IMPLANT
KIT BASIN OR (CUSTOM PROCEDURE TRAY) ×2 IMPLANT
KIT TURNOVER KIT A (KITS) ×2 IMPLANT
NS IRRIG 1000ML POUR BTL (IV SOLUTION) ×2 IMPLANT
PENCIL SMOKE EVACUATOR (MISCELLANEOUS) IMPLANT
POUCH RETRIEVAL ECOSAC 10 (ENDOMECHANICALS) ×1 IMPLANT
POUCH RETRIEVAL ECOSAC 10MM (ENDOMECHANICALS) ×2
POUCH SPECIMEN RETRIEVAL 10MM (ENDOMECHANICALS) IMPLANT
PROTECTOR NERVE ULNAR (MISCELLANEOUS) IMPLANT
SCISSORS LAP 5X35 DISP (ENDOMECHANICALS) ×2 IMPLANT
SET CHOLANGIOGRAPH MIX (MISCELLANEOUS) ×2 IMPLANT
SET IRRIG TUBING LAPAROSCOPIC (IRRIGATION / IRRIGATOR) ×2 IMPLANT
SET TUBE SMOKE EVAC HIGH FLOW (TUBING) ×2 IMPLANT
SPONGE GAUZE 2X2 STER 10/PKG (GAUZE/BANDAGES/DRESSINGS) ×1
STRIP CLOSURE SKIN 1/2X4 (GAUZE/BANDAGES/DRESSINGS) ×2 IMPLANT
SUT MNCRL AB 4-0 PS2 18 (SUTURE) ×2 IMPLANT
SYR 20ML LL LF (SYRINGE) IMPLANT
TAPE CLOTH 4X10 WHT NS (GAUZE/BANDAGES/DRESSINGS) IMPLANT
TOWEL OR 17X26 10 PK STRL BLUE (TOWEL DISPOSABLE) ×2 IMPLANT
TOWEL OR NON WOVEN STRL DISP B (DISPOSABLE) ×2 IMPLANT
TRAY LAPAROSCOPIC (CUSTOM PROCEDURE TRAY) ×2 IMPLANT
TROCAR BLADELESS OPT 5 100 (ENDOMECHANICALS) ×4 IMPLANT
TROCAR XCEL BLUNT TIP 100MML (ENDOMECHANICALS) ×2 IMPLANT
TROCAR XCEL NON-BLD 11X100MML (ENDOMECHANICALS) ×2 IMPLANT
WATER STERILE IRR 1000ML POUR (IV SOLUTION) ×2 IMPLANT

## 2020-09-08 NOTE — Anesthesia Preprocedure Evaluation (Signed)
Anesthesia Evaluation  Patient identified by MRN, date of birth, ID band Patient awake    Reviewed: Allergy & Precautions, NPO status , Patient's Chart, lab work & pertinent test results  Airway Mallampati: II  TM Distance: >3 FB Neck ROM: Full    Dental no notable dental hx.    Pulmonary sleep apnea and Continuous Positive Airway Pressure Ventilation , Patient abstained from smoking., former smoker,    Pulmonary exam normal breath sounds clear to auscultation       Cardiovascular hypertension, + Peripheral Vascular Disease  Normal cardiovascular exam Rhythm:Regular Rate:Normal     Neuro/Psych negative neurological ROS  negative psych ROS   GI/Hepatic negative GI ROS, Neg liver ROS,   Endo/Other  diabetes, Insulin Dependent  Renal/GU negative Renal ROS  negative genitourinary   Musculoskeletal negative musculoskeletal ROS (+)   Abdominal   Peds negative pediatric ROS (+)  Hematology negative hematology ROS (+)   Anesthesia Other Findings   Reproductive/Obstetrics negative OB ROS                             Anesthesia Physical Anesthesia Plan  ASA: 3  Anesthesia Plan: General   Post-op Pain Management:    Induction: Intravenous  PONV Risk Score and Plan: 2 and Ondansetron, Dexamethasone and Treatment may vary due to age or medical condition  Airway Management Planned: Oral ETT  Additional Equipment:   Intra-op Plan:   Post-operative Plan: Extubation in OR  Informed Consent: I have reviewed the patients History and Physical, chart, labs and discussed the procedure including the risks, benefits and alternatives for the proposed anesthesia with the patient or authorized representative who has indicated his/her understanding and acceptance.     Dental advisory given  Plan Discussed with: CRNA and Surgeon  Anesthesia Plan Comments:         Anesthesia Quick  Evaluation

## 2020-09-08 NOTE — Anesthesia Procedure Notes (Signed)
Procedure Name: Intubation Date/Time: 09/08/2020 3:43 PM Performed by: Rosaland Lao, CRNA Pre-anesthesia Checklist: Patient identified, Emergency Drugs available, Suction available and Patient being monitored Patient Re-evaluated:Patient Re-evaluated prior to induction Oxygen Delivery Method: Circle system utilized Preoxygenation: Pre-oxygenation with 100% oxygen Induction Type: IV induction Ventilation: Mask ventilation without difficulty and Oral airway inserted - appropriate to patient size Laryngoscope Size: Sabra Heck and 3 Grade View: Grade III Tube type: Oral Tube size: 8.0 mm Number of attempts: 1 Airway Equipment and Method: Stylet, Oral airway and Bougie stylet Placement Confirmation: ETT inserted through vocal cords under direct vision, positive ETCO2 and breath sounds checked- equal and bilateral Secured at: 23 cm Tube secured with: Tape Dental Injury: Teeth and Oropharynx as per pre-operative assessment  Difficulty Due To: Difficulty was unanticipated and Difficult Airway- due to reduced neck mobility Comments: DL with Miller 3, grade II b view, mask ventilated then bougie used with Sabra Heck 3 for successful intubation.

## 2020-09-08 NOTE — Discharge Instructions (Signed)
[facility] ______[facility] LAPAROSCOPIC SURGERY: POST OP INSTRUCTIONS Always review your discharge instruction sheet given to you by the facility where your surgery was performed. IF YOU HAVE DISABILITY OR FAMILY LEAVE FORMS, YOU MUST BRING THEM TO THE OFFICE FOR PROCESSING.   DO NOT GIVE THEM TO YOUR DOCTOR.  A prescription for pain medication may be given to you upon discharge.  Take your pain medication as prescribed, if needed.  If narcotic pain medicine is not needed, then you may take acetaminophen (Tylenol) or ibuprofen (Advil) as needed. Take your usually prescribed medications unless otherwise directed. If you need a refill on your pain medication, please contact your pharmacy.  They will contact our office to request authorization. Prescriptions will not be filled after 5pm or on week-ends. You should follow a light diet the first few days after arrival home, such as soup and crackers, etc.  Be sure to include lots of fluids daily. Most patients will experience some swelling and bruising in the area of the incisions.  Ice packs will help.  Swelling and bruising can take several days to resolve.  It is common to experience some constipation if taking pain medication after surgery.  Increasing fluid intake and taking a stool softener (such as Colace) will usually help or prevent this problem from occurring.  A mild laxative (Milk of Magnesia or Miralax) should be taken according to package instructions if there are no bowel movements after 48 hours. Unless discharge instructions indicate otherwise, you may remove your bandages 24-48 hours after surgery, and you may shower at that time.  You may have steri-strips (small skin tapes) in place directly over the incision.  These strips should be left on the skin for 7-10 days.  If your surgeon used skin glue on the incision, you may shower in 24 hours.  The glue will flake off over the next 2-3 weeks.  Any sutures or staples will be  removed at the office during your follow-up visit. ACTIVITIES:  You may resume regular (light) daily activities beginning the next day--such as daily self-care, walking, climbing stairs--gradually increasing activities as tolerated.  You may have sexual intercourse when it is comfortable.  Refrain from any heavy lifting or straining until approved by your doctor. You may drive when you are no longer taking prescription pain medication, you can comfortably wear a seatbelt, and you can safely maneuver your car and apply brakes. RETURN TO WORK:  __________________________________________________________ You should see your doctor in the office for a follow-up appointment approximately 2-3 weeks after your surgery.  Make sure that you call for this appointment within a day or two after you arrive home to insure a convenient appointment time. OTHER INSTRUCTIONS: __________________________________________________________________________________________________________________________ __________________________________________________________________________________________________________________________ WHEN TO CALL YOUR DOCTOR: Fever over 101.0 Inability to urinate Continued bleeding from incision. Increased pain, redness, or drainage from the incision. Increasing abdominal pain  The clinic staff is available to answer your questions during regular business hours.  Please don't hesitate to call and ask to speak to one of the nurses for clinical concerns.  If you have a medical emergency, go to the nearest emergency room or call 911.  A surgeon from [facility] is always on call at the hospital. [address], [address], [place], [place]  [address] ? [address] [address], [place], [place]   [address] [phone] ? [phone] ? FAX [phone] Web site: [email or web address]  

## 2020-09-08 NOTE — Transfer of Care (Signed)
Immediate Anesthesia Transfer of Care Note  Patient: [name].  Procedure(s) Performed: LAPAROSCOPIC CHOLECYSTECTOMY WITH INTRAOPERATIVE CHOLANGIOGRAM (Abdomen)  Patient Location: PACU  Anesthesia Type:General  Level of Consciousness: awake, alert , oriented and patient cooperative  Airway & Oxygen Therapy: Patient Spontanous Breathing and Patient connected to face mask oxygen  Post-op Assessment: Report given to RN and Post -op Vital signs reviewed and stable  Post vital signs: Reviewed and stable  Last Vitals:  Vitals Value Taken Time  BP 180/88 09/08/20 1705  Temp    Pulse 63 09/08/20 1711  Resp 17 09/08/20 1711  SpO2 97 % 09/08/20 1711  Vitals shown include unvalidated device data.  Last Pain:  Vitals:   09/08/20 1245  TempSrc:   PainSc: 0-No pain         Complications: No notable events documented.

## 2020-09-08 NOTE — Anesthesia Postprocedure Evaluation (Signed)
Anesthesia Post Note  Patient: [name].  Procedure(s) Performed: LAPAROSCOPIC CHOLECYSTECTOMY WITH INTRAOPERATIVE CHOLANGIOGRAM (Abdomen)     Patient location during evaluation: PACU Anesthesia Type: General Level of consciousness: awake and alert Pain management: pain level controlled Vital Signs Assessment: post-procedure vital signs reviewed and stable Respiratory status: spontaneous breathing, nonlabored ventilation, respiratory function stable and patient connected to nasal cannula oxygen Cardiovascular status: blood pressure returned to baseline and stable Postop Assessment: no apparent nausea or vomiting Anesthetic complications: no   No notable events documented.  Last Vitals:  Vitals:   09/08/20 1800 09/08/20 1810  BP: (!) 194/74 (!) 180/68  Pulse: 64 67  Resp: 10 14  Temp:  36.8 C  SpO2: 95% 97%    Last Pain:  Vitals:   09/08/20 1810  TempSrc:   PainSc: 3                  Eliberto Sole S

## 2020-09-08 NOTE — Op Note (Signed)
Laparoscopic Cholecystectomy with IOC Procedure Note  Indications:   This is a 70 year old male with DM, OSA, PVD, HTN, hyperlipidemia, sigmoid diverticulitis who presented to the ED on 07/29/20 with acute epigastric/ RUQ abdominal pain, nausea, vomiting, bloating.  He had no sign of gallstones, but there was some wall thickening of his GB.  HIDA scan showed a low GB EF of 4%.  He has had a few more milder episodes since then after eating greasy foods.  No diarrhea.  He presents now to discuss surgical cholecystectomy.  Pre-operative Diagnosis: Biliary dyskinesia  Post-operative Diagnosis: Biliary dyskinesia  Surgeon: Maia Petties   Assistants: Carlena Hurl, PA-C  Anesthesia: General endotracheal anesthesia  ASA Class: 2  Procedure Details  The patient was seen again in the Holding Room. The risks, benefits, complications, treatment options, and expected outcomes were discussed with the patient. The possibilities of reaction to medication, pulmonary aspiration, perforation of viscus, bleeding, recurrent infection, finding a normal gallbladder, the need for additional procedures, failure to diagnose a condition, the possible need to convert to an open procedure, and creating a complication requiring transfusion or operation were discussed with the patient. The likelihood of improving the patient's symptoms with return to their baseline status is good.  The patient and/or family concurred with the proposed plan, giving informed consent. The site of surgery properly noted. The patient was taken to Operating Room, identified as Ikeem Cleckler. and the procedure verified as Laparoscopic Cholecystectomy with Intraoperative Cholangiogram. A Time Out was held and the above information confirmed.  Prior to the induction of general anesthesia, antibiotic prophylaxis was administered. General endotracheal anesthesia was then administered and tolerated well. After the induction, the abdomen was prepped  with Chloraprep and draped in the sterile fashion. The patient was positioned in the supine position.  Local anesthetic agent was injected into the skin above the umbilicus and an incision made. We dissected down to the abdominal fascia with blunt dissection.  He has a small umbilical hernia.  We grasped the edges of the fascia with Kocher clamps.  The fascia was incised vertically and we entered the peritoneal cavity bluntly.  A pursestring suture of 0-Vicryl was placed around the fascial opening.  The Hasson cannula was inserted and secured with the stay suture.  Pneumoperitoneum was then created with CO2 and tolerated well without any adverse changes in the patient's vital signs. An 11-mm port was placed in the subxiphoid position.  Two 5-mm ports were placed in the right upper quadrant. All skin incisions were infiltrated with a local anesthetic agent before making the incision and placing the trocars.   We positioned the patient in reverse Trendelenburg, tilted slightly to the patient's left.  The gallbladder was identified, the fundus grasped and retracted cephalad. Adhesions were lysed bluntly and with the electrocautery where indicated, taking care not to injure any adjacent organs or viscus. The infundibulum was grasped and retracted laterally, exposing the peritoneum overlying the triangle of Calot. This was then divided and exposed in a blunt fashion. A critical view of the cystic duct and cystic artery was obtained.  The cystic duct was clearly identified and bluntly dissected circumferentially. The cystic duct was ligated with a clip distally.   An incision was made in the cystic duct and the [facility] cholangiogram catheter introduced. The catheter was secured using a clip. A cholangiogram was then obtained which showed good visualization of the distal and proximal biliary tree with no sign of filling defects or obstruction.  Contrast flowed easily into the duodenum. The catheter was then removed.    The cystic duct was then ligated with clips and divided. The cystic artery was identified, dissected free, ligated with clips and divided as well.   The gallbladder was dissected from the liver bed in retrograde fashion with the electrocautery. The gallbladder was removed and placed in an Eco sac. The liver bed was irrigated and inspected. Hemostasis was achieved with the electrocautery. Copious irrigation was utilized and was repeatedly aspirated until clear.  The gallbladder and Eco sac were then removed through the umbilical port site.  The pursestring suture was used to close the umbilical fascia.    We again inspected the right upper quadrant for hemostasis.  Pneumoperitoneum was released as we removed the trocars.  4-0 Monocryl was used to close the skin.   Benzoin, steri-strips, and clean dressings were applied. The patient was then extubated and brought to the recovery room in stable condition. Instrument, sponge, and needle counts were correct at closure and at the conclusion of the case.   Findings: Cholecystitis without Cholelithiasis  Estimated Blood Loss: Minimal         Drains: none         Specimens: Gallbladder           Complications: None; patient tolerated the procedure well.         Disposition: PACU - hemodynamically stable.         Condition: stable  Imogene Burn. Georgette Dover, MD, [facility]  General Surgery   09/08/2020 4:56 PM

## 2020-09-08 NOTE — Interval H&P Note (Signed)
History and Physical Interval Note:  09/08/2020 2:56 PM  [name].  has presented today for surgery, with the diagnosis of BILIARY DYSKINESIA.  The various methods of treatment have been discussed with the patient and family. After consideration of risks, benefits and other options for treatment, the patient has consented to  Procedure(s): LAPAROSCOPIC CHOLECYSTECTOMY WITH INTRAOPERATIVE CHOLANGIOGRAM (N/A) as a surgical intervention.  The patient's history has been reviewed, patient examined, no change in status, stable for surgery.  I have reviewed the patient's chart and labs.  Questions were answered to the patient's satisfaction.     Maia Petties

## 2020-09-09 ENCOUNTER — Encounter (HOSPITAL_COMMUNITY): Payer: Self-pay | Admitting: Surgery

## 2020-09-10 LAB — SURGICAL PATHOLOGY

## 2020-10-04 DIAGNOSIS — I6200 Nontraumatic subdural hemorrhage, unspecified: Secondary | ICD-10-CM | POA: Insufficient documentation

## 2020-10-04 HISTORY — DX: Nontraumatic subdural hemorrhage, unspecified: I62.00

## 2020-10-05 DIAGNOSIS — M25562 Pain in left knee: Secondary | ICD-10-CM | POA: Diagnosis not present

## 2021-01-19 DIAGNOSIS — L918 Other hypertrophic disorders of the skin: Secondary | ICD-10-CM | POA: Diagnosis not present

## 2021-01-19 DIAGNOSIS — L820 Inflamed seborrheic keratosis: Secondary | ICD-10-CM | POA: Diagnosis not present

## 2021-01-24 DIAGNOSIS — Z23 Encounter for immunization: Secondary | ICD-10-CM | POA: Diagnosis not present

## 2021-01-25 DIAGNOSIS — H02052 Trichiasis without entropian right lower eyelid: Secondary | ICD-10-CM | POA: Diagnosis not present

## 2021-01-25 DIAGNOSIS — E11319 Type 2 diabetes mellitus with unspecified diabetic retinopathy without macular edema: Secondary | ICD-10-CM | POA: Diagnosis not present

## 2021-09-20 DIAGNOSIS — B952 Enterococcus as the cause of diseases classified elsewhere: Secondary | ICD-10-CM | POA: Diagnosis not present

## 2021-09-20 DIAGNOSIS — R079 Chest pain, unspecified: Secondary | ICD-10-CM | POA: Diagnosis not present

## 2021-09-20 DIAGNOSIS — Z880 Allergy status to penicillin: Secondary | ICD-10-CM | POA: Diagnosis not present

## 2021-09-20 DIAGNOSIS — I110 Hypertensive heart disease with heart failure: Secondary | ICD-10-CM | POA: Diagnosis not present

## 2021-09-20 DIAGNOSIS — J9601 Acute respiratory failure with hypoxia: Secondary | ICD-10-CM | POA: Diagnosis not present

## 2021-09-20 DIAGNOSIS — E8729 Other acidosis: Secondary | ICD-10-CM | POA: Diagnosis not present

## 2021-09-20 DIAGNOSIS — G4733 Obstructive sleep apnea (adult) (pediatric): Secondary | ICD-10-CM | POA: Diagnosis not present

## 2021-09-20 DIAGNOSIS — M7989 Other specified soft tissue disorders: Secondary | ICD-10-CM | POA: Diagnosis not present

## 2021-09-20 DIAGNOSIS — M545 Low back pain, unspecified: Secondary | ICD-10-CM | POA: Diagnosis not present

## 2021-09-20 DIAGNOSIS — R7989 Other specified abnormal findings of blood chemistry: Secondary | ICD-10-CM | POA: Diagnosis not present

## 2021-09-20 DIAGNOSIS — E785 Hyperlipidemia, unspecified: Secondary | ICD-10-CM | POA: Diagnosis not present

## 2021-09-20 DIAGNOSIS — E782 Mixed hyperlipidemia: Secondary | ICD-10-CM | POA: Insufficient documentation

## 2021-09-20 DIAGNOSIS — I4891 Unspecified atrial fibrillation: Secondary | ICD-10-CM | POA: Diagnosis not present

## 2021-09-20 DIAGNOSIS — R0902 Hypoxemia: Secondary | ICD-10-CM | POA: Diagnosis not present

## 2021-09-20 DIAGNOSIS — Z881 Allergy status to other antibiotic agents status: Secondary | ICD-10-CM | POA: Diagnosis not present

## 2021-09-20 DIAGNOSIS — I480 Paroxysmal atrial fibrillation: Secondary | ICD-10-CM | POA: Diagnosis not present

## 2021-09-20 DIAGNOSIS — E119 Type 2 diabetes mellitus without complications: Secondary | ICD-10-CM | POA: Diagnosis not present

## 2021-09-20 DIAGNOSIS — E871 Hypo-osmolality and hyponatremia: Secondary | ICD-10-CM | POA: Diagnosis not present

## 2021-09-20 DIAGNOSIS — R5381 Other malaise: Secondary | ICD-10-CM | POA: Diagnosis not present

## 2021-09-20 DIAGNOSIS — J441 Chronic obstructive pulmonary disease with (acute) exacerbation: Secondary | ICD-10-CM | POA: Diagnosis not present

## 2021-09-20 DIAGNOSIS — Z794 Long term (current) use of insulin: Secondary | ICD-10-CM | POA: Diagnosis not present

## 2021-09-20 DIAGNOSIS — J440 Chronic obstructive pulmonary disease with acute lower respiratory infection: Secondary | ICD-10-CM | POA: Diagnosis not present

## 2021-09-20 DIAGNOSIS — I509 Heart failure, unspecified: Secondary | ICD-10-CM | POA: Diagnosis not present

## 2021-09-20 DIAGNOSIS — R0602 Shortness of breath: Secondary | ICD-10-CM | POA: Diagnosis not present

## 2021-09-20 DIAGNOSIS — I10 Essential (primary) hypertension: Secondary | ICD-10-CM | POA: Diagnosis not present

## 2021-09-20 DIAGNOSIS — R600 Localized edema: Secondary | ICD-10-CM | POA: Diagnosis not present

## 2021-09-20 DIAGNOSIS — Z20822 Contact with and (suspected) exposure to covid-19: Secondary | ICD-10-CM | POA: Diagnosis not present

## 2021-09-20 DIAGNOSIS — I5033 Acute on chronic diastolic (congestive) heart failure: Secondary | ICD-10-CM | POA: Diagnosis not present

## 2021-09-20 DIAGNOSIS — R0689 Other abnormalities of breathing: Secondary | ICD-10-CM | POA: Diagnosis not present

## 2021-09-20 DIAGNOSIS — R7881 Bacteremia: Secondary | ICD-10-CM | POA: Diagnosis not present

## 2021-09-21 DIAGNOSIS — M7989 Other specified soft tissue disorders: Secondary | ICD-10-CM | POA: Diagnosis not present

## 2021-09-21 DIAGNOSIS — E871 Hypo-osmolality and hyponatremia: Secondary | ICD-10-CM | POA: Diagnosis not present

## 2021-09-21 DIAGNOSIS — I509 Heart failure, unspecified: Secondary | ICD-10-CM | POA: Diagnosis not present

## 2021-09-21 DIAGNOSIS — J9601 Acute respiratory failure with hypoxia: Secondary | ICD-10-CM | POA: Diagnosis not present

## 2021-09-21 DIAGNOSIS — R7881 Bacteremia: Secondary | ICD-10-CM | POA: Diagnosis not present

## 2021-09-21 DIAGNOSIS — B952 Enterococcus as the cause of diseases classified elsewhere: Secondary | ICD-10-CM | POA: Diagnosis not present

## 2021-09-22 DIAGNOSIS — E871 Hypo-osmolality and hyponatremia: Secondary | ICD-10-CM | POA: Diagnosis not present

## 2021-09-22 DIAGNOSIS — I480 Paroxysmal atrial fibrillation: Secondary | ICD-10-CM | POA: Insufficient documentation

## 2021-09-22 DIAGNOSIS — B952 Enterococcus as the cause of diseases classified elsewhere: Secondary | ICD-10-CM | POA: Diagnosis not present

## 2021-09-22 DIAGNOSIS — R7881 Bacteremia: Secondary | ICD-10-CM | POA: Diagnosis not present

## 2021-09-22 DIAGNOSIS — J9601 Acute respiratory failure with hypoxia: Secondary | ICD-10-CM | POA: Diagnosis not present

## 2021-09-23 DIAGNOSIS — I480 Paroxysmal atrial fibrillation: Secondary | ICD-10-CM | POA: Diagnosis not present

## 2021-09-23 DIAGNOSIS — J9601 Acute respiratory failure with hypoxia: Secondary | ICD-10-CM | POA: Diagnosis not present

## 2021-09-23 DIAGNOSIS — I10 Essential (primary) hypertension: Secondary | ICD-10-CM | POA: Diagnosis not present

## 2021-11-04 DIAGNOSIS — E119 Type 2 diabetes mellitus without complications: Secondary | ICD-10-CM | POA: Diagnosis not present

## 2022-02-22 IMAGING — US US ABDOMEN LIMITED
1 series · 14 of 25 positions shown · non-contrast
Comparison: 07/29/2020

CLINICAL DATA: Right-sided abdominal pain

EXAM:
ULTRASOUND ABDOMEN LIMITED RIGHT UPPER QUADRANT

[Series 1: us abdomen limited · 14 of 47 slices shown]
[im 1/47]
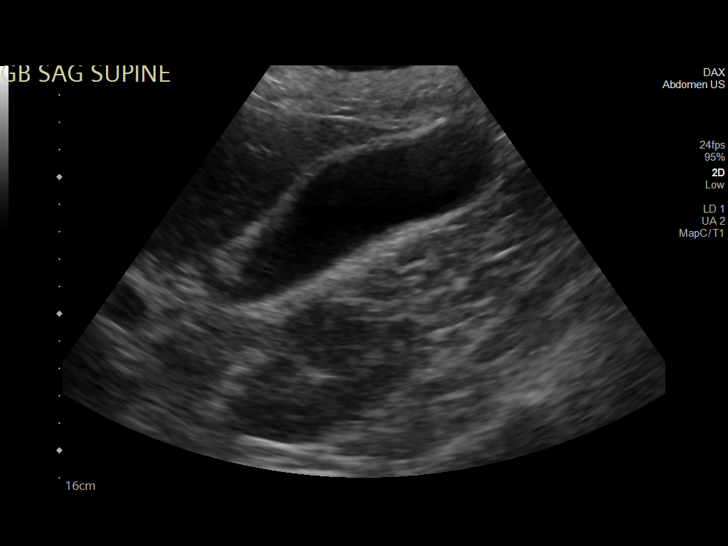
[im 4/47]
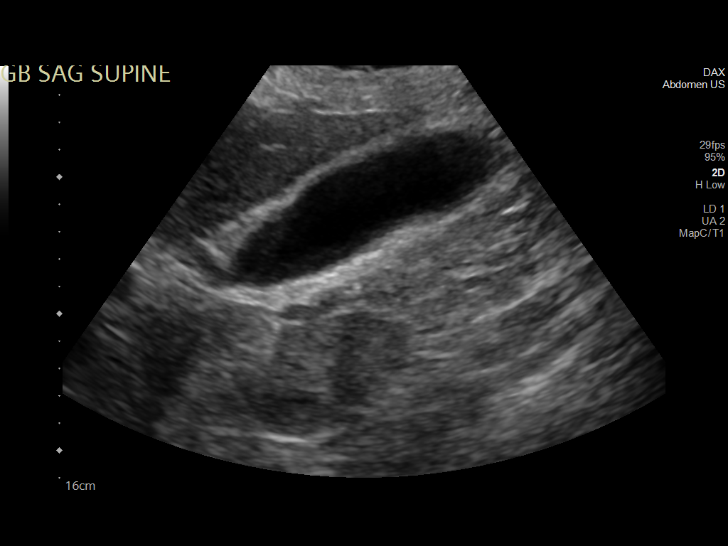
[im 8/47]
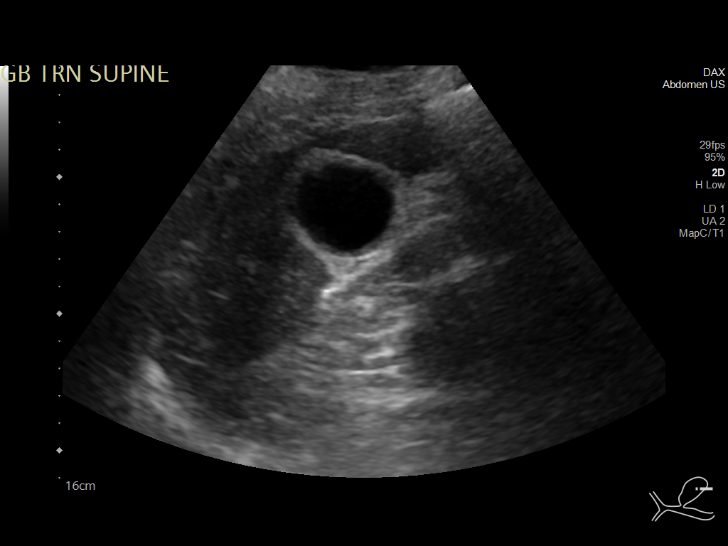
[im 12/47]
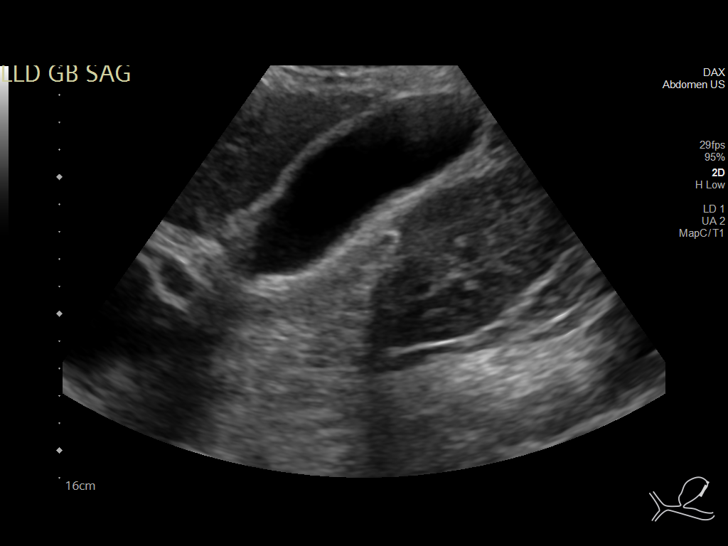
[im 16/47]
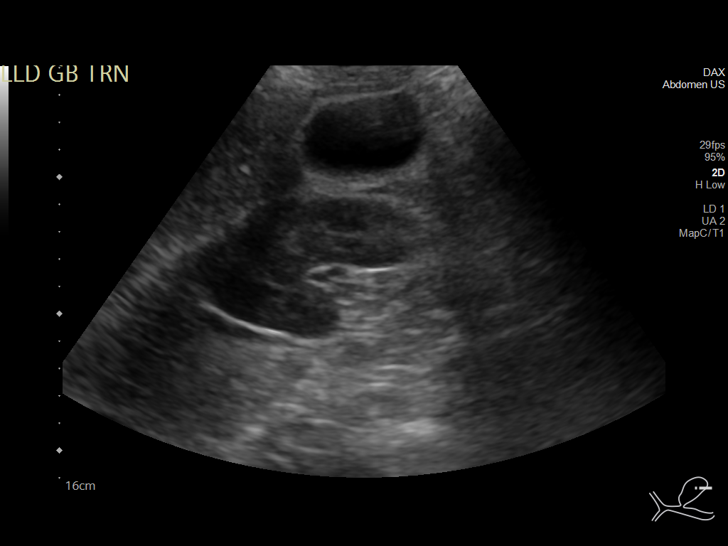
[im 18/47]
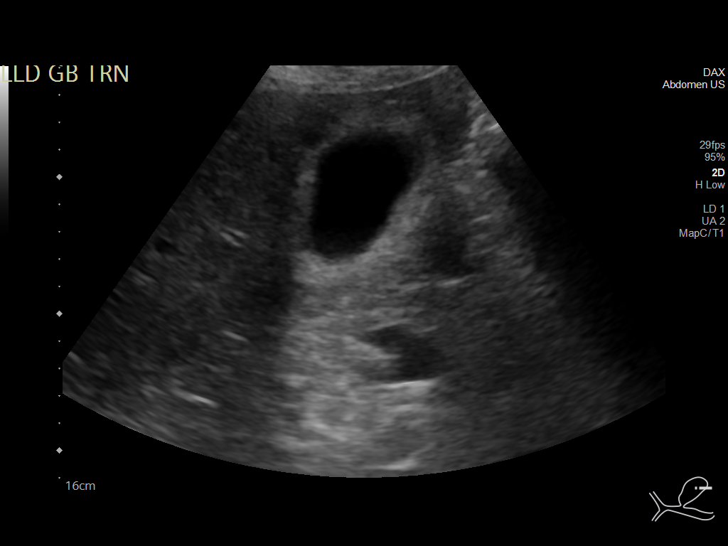
[im 22/47]
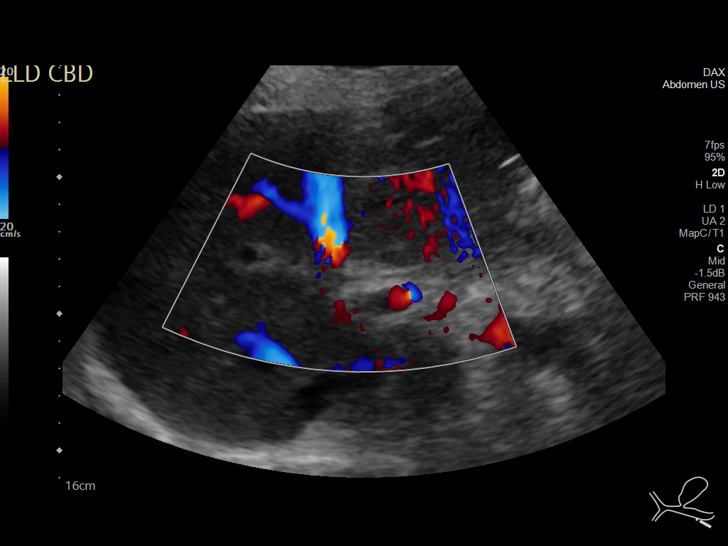
[im 25/47]
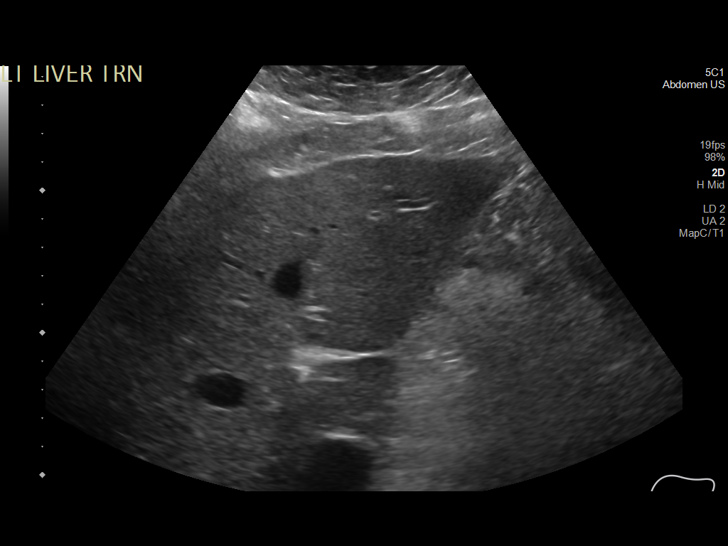
[im 29/47]
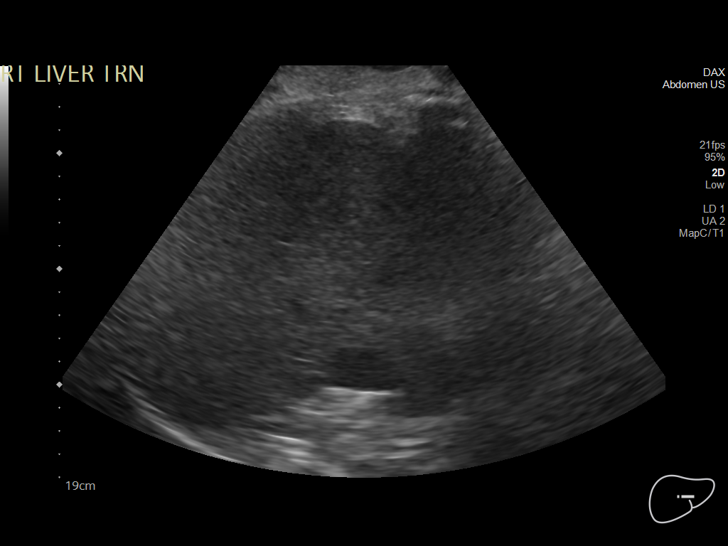
[im 31/47]
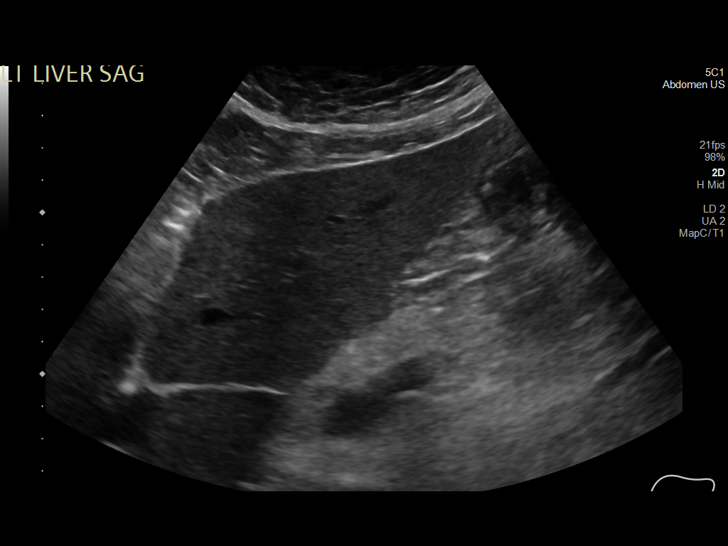
[im 35/47]
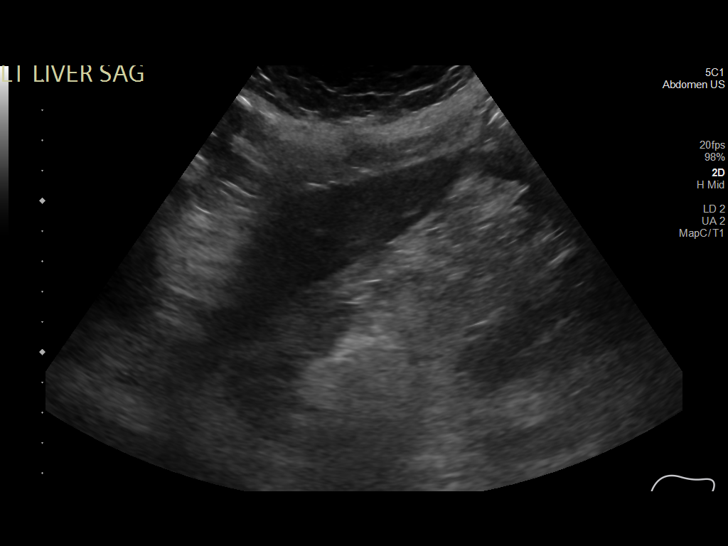
[im 39/47]
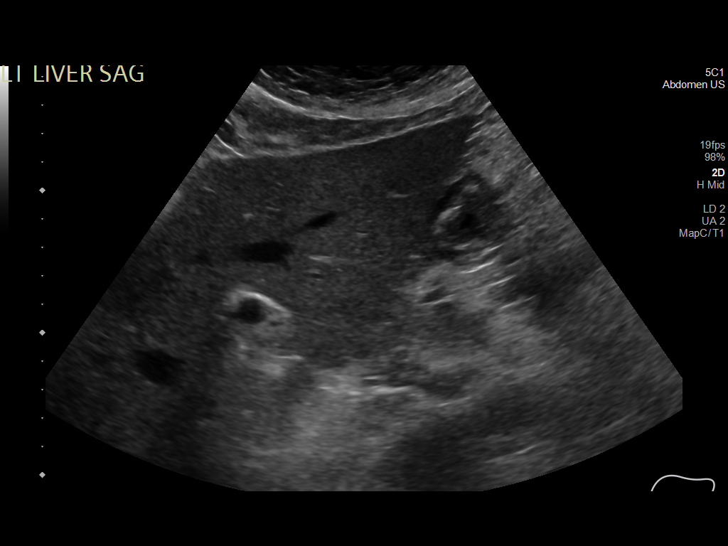
[im 43/47]
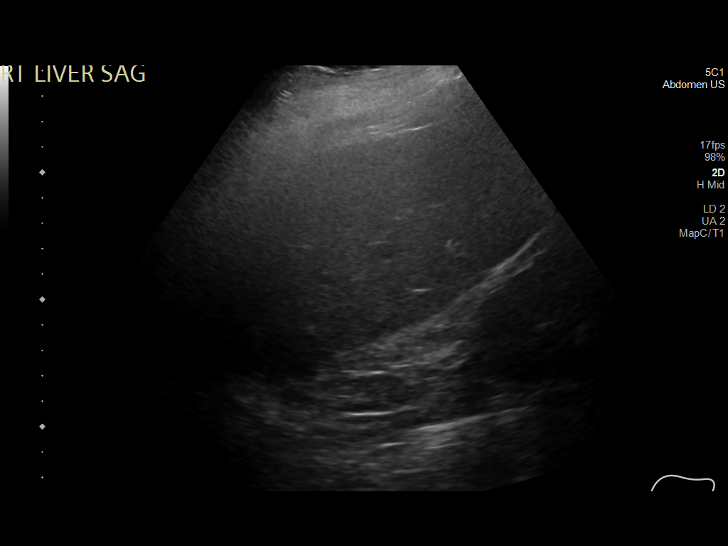
[im 47/47]
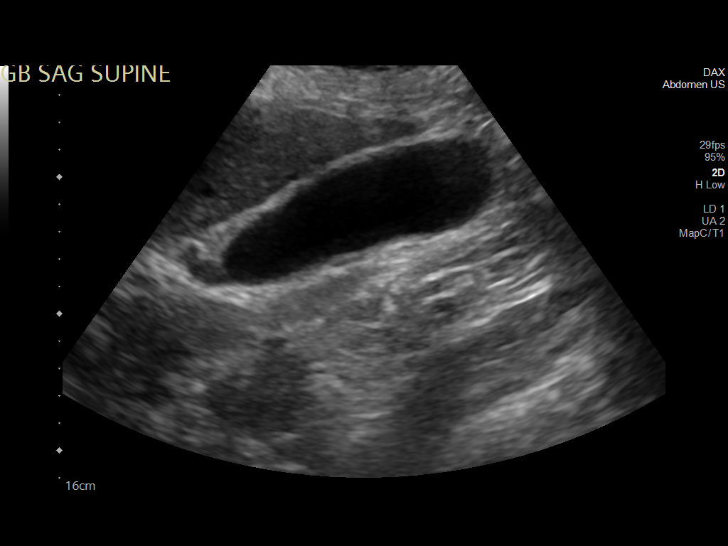

[14 of 25 positions shown; findings below may reference images not displayed]

FINDINGS: Gallbladder:

No gallstones or biliary sludge. Mild gallbladder wall thickening
measuring 4 mm, nonspecific. No pericholecystic fluid. Negative
sonographic Murphy sign.

Common bile duct:

Diameter: 4 mm

Liver:

No focal lesion identified. Within normal limits in parenchymal
echogenicity. Portal vein is patent on color Doppler imaging with
normal direction of blood flow towards the liver.

Other: None.
IMPRESSION: 1. Nonspecific mild gallbladder wall thickening measuring 4 mm. No
evidence of cholelithiasis or gallbladder sludge. If acute
acalculous cholecystitis is suspected, follow-up nuclear medicine
hepatobiliary scan could be considered.
2. Otherwise unremarkable right upper quadrant ultrasound.

## 2022-02-22 IMAGING — CT CT ABD-PELV W/O CM
2 of 4 series · 16 of 46 positions shown, 18 images · non-contrast
Comparison: Abdominopelvic CT 09/07/2019

CLINICAL DATA: Acute onset of right-sided abdominal pain. Kidney
stone or appendicitis suspected

EXAM:
CT ABDOMEN AND PELVIS WITHOUT CONTRAST
TECHNIQUE: Multidetector CT imaging of the abdomen and pelvis was performed
following the standard protocol without IV contrast.

[Series 3: ap without · axial · non-contrast · 0.95mm/px · z∈[-508,-73]mm · 13 of 99 slices shown, 15 images]
[im 6/99  soft-tissue]
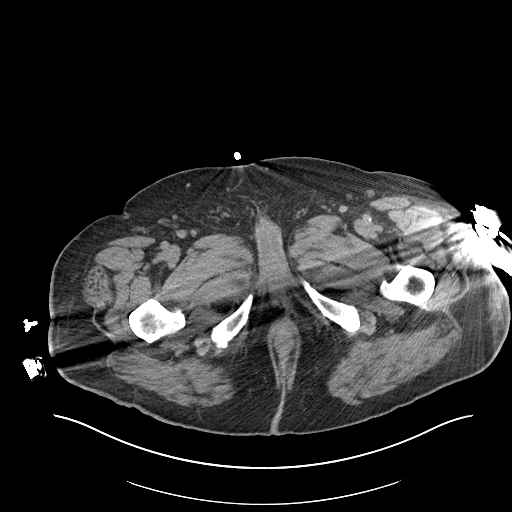
[im 6/99  bone]
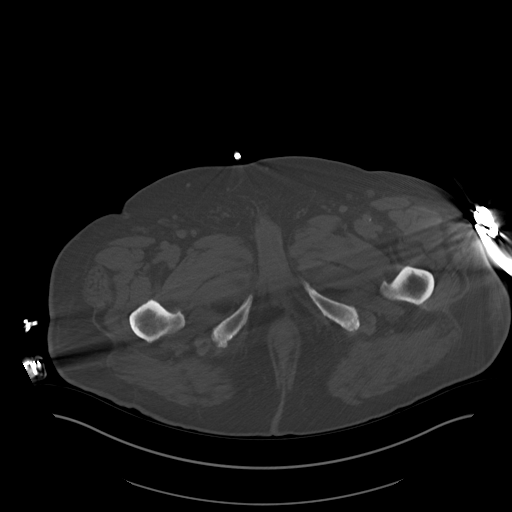
[im 12/99  soft-tissue]
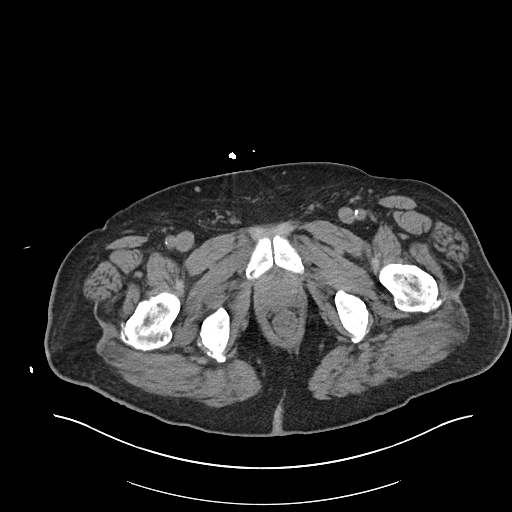
[im 24/99  soft-tissue]
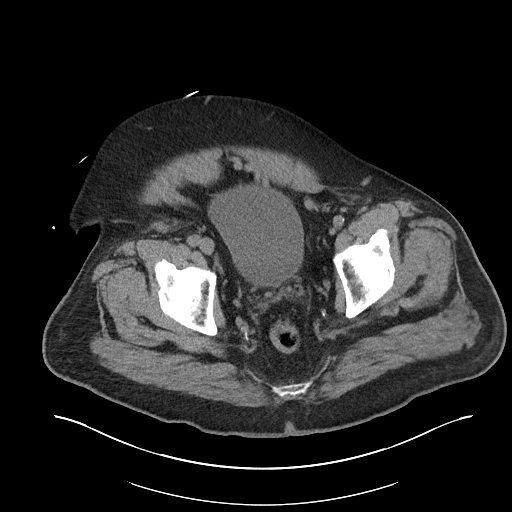
[im 29/99  soft-tissue]
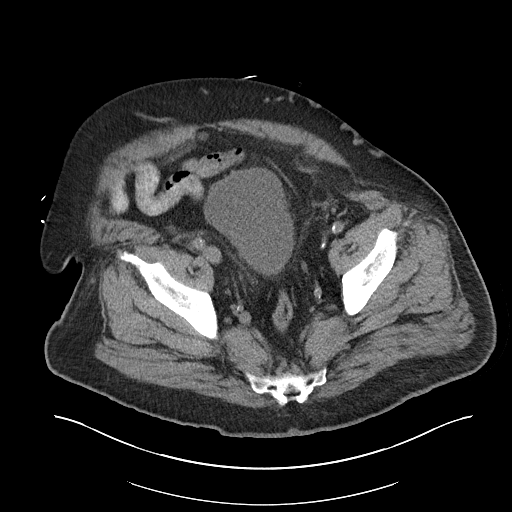
[im 35/99  soft-tissue]
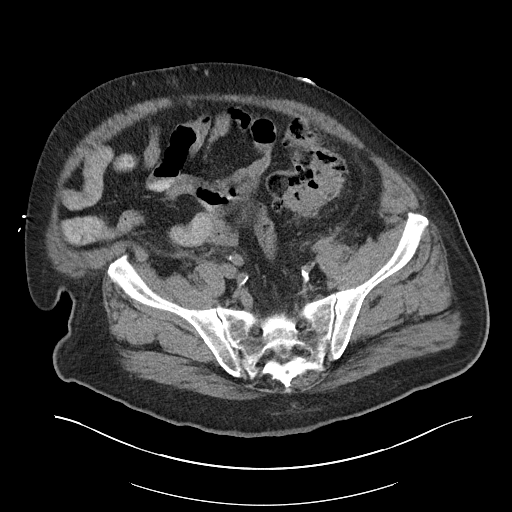
[im 41/99  soft-tissue]
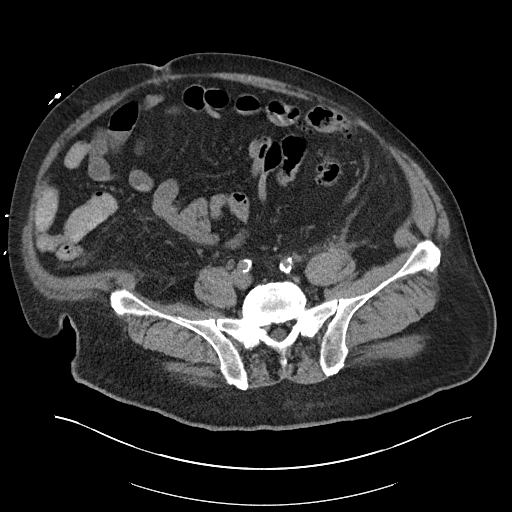
[im 52/99  soft-tissue]
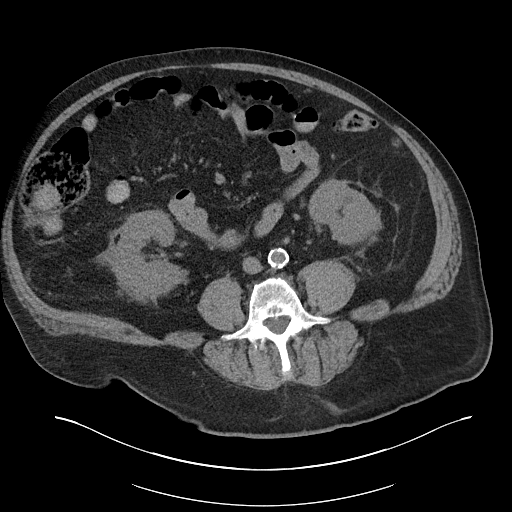
[im 58/99  soft-tissue]
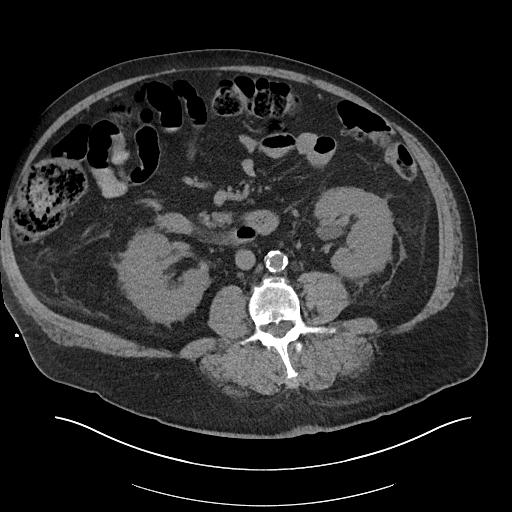
[im 64/99  soft-tissue]
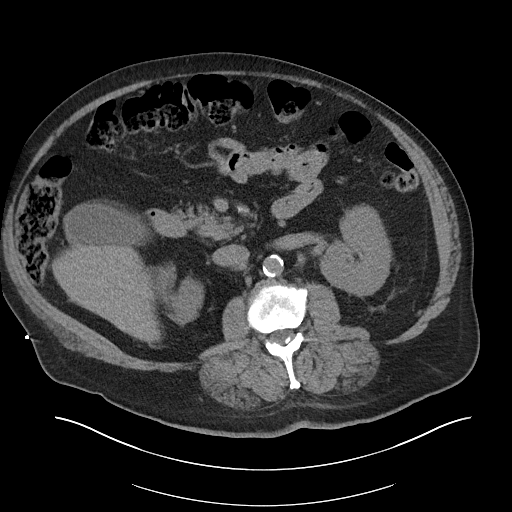
[im 64/99  bone]
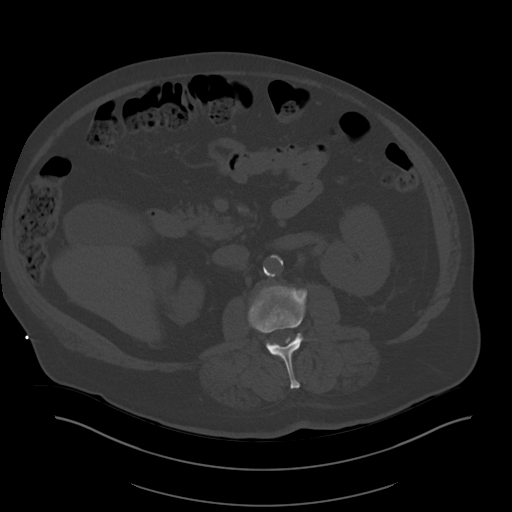
[im 70/99  soft-tissue]
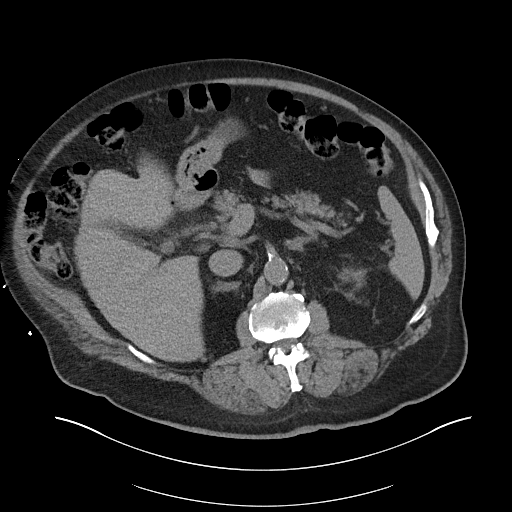
[im 75/99  soft-tissue]
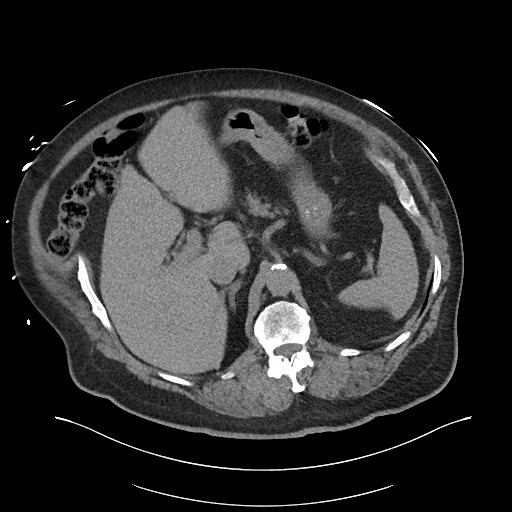
[im 87/99  soft-tissue]
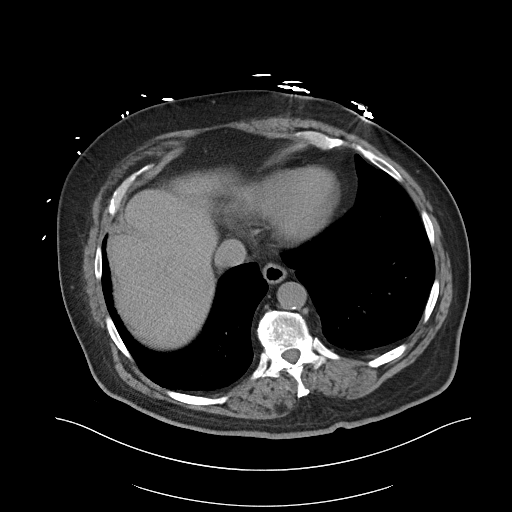
[im 93/99  soft-tissue]
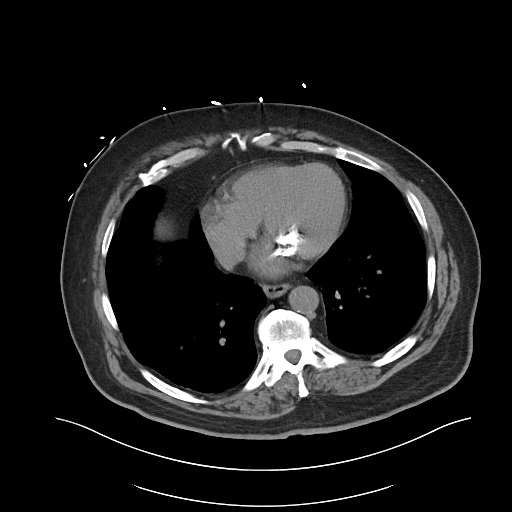

[Series 6: cor · coronal · 0.96mm/px · 3 of 140 slices shown]
[im 47/140  soft-tissue]
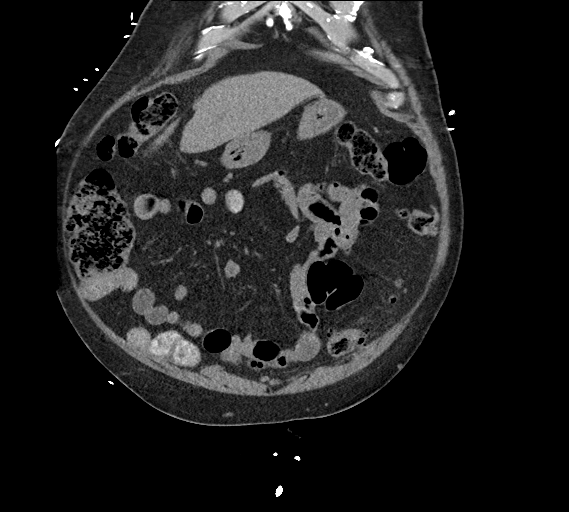
[im 62/140  soft-tissue]
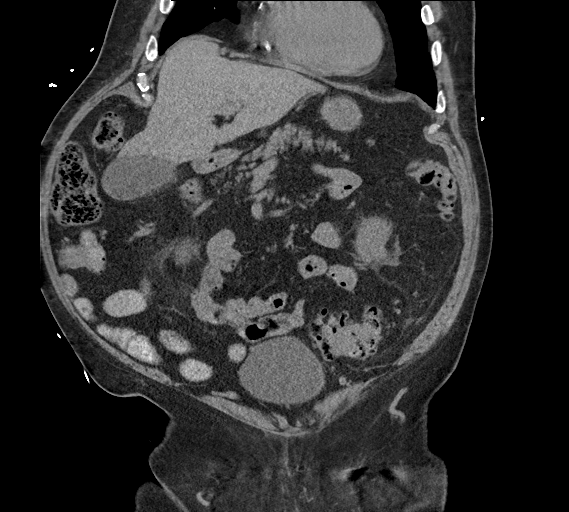
[im 78/140  soft-tissue]
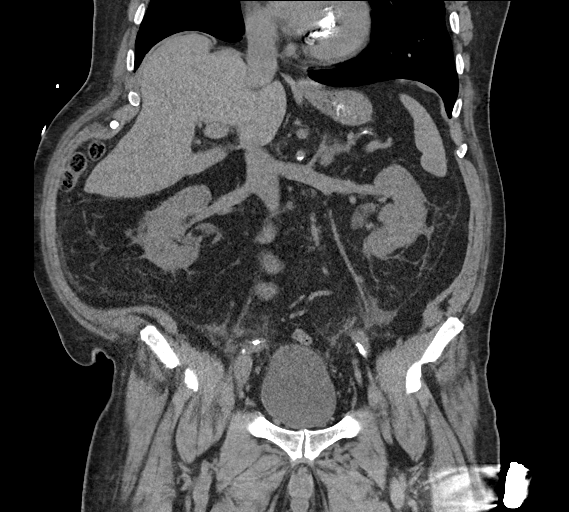

[16 of 46 positions shown; findings below may reference images not displayed]

FINDINGS: Lower chest: Mild lower lobe atelectasis. Minimal bronchiectasis in
the right lower lobe. Coronary artery calcifications. Mitral annulus
calcifications. Normal heart size. Stable 4 mm left lower lobe
pulmonary nodule, series 5, image 14. This is been unchanged since
1068 CT and is consistent with benign process.

Hepatobiliary: No focal hepatic abnormality on noncontrast exam.
Gallbladder is distended with slight indistinctness of gallbladder
wall, series 3, image 36. No calcified gallstone. There is no
biliary dilatation.

Pancreas: No ductal dilatation or inflammation.

Spleen: Normal in size without focal abnormality.

Adrenals/Urinary Tract: Mild adrenal thickening without dominant
adrenal nodule. Symmetric perinephric edema which is unchanged from
prior exam. No renal or ureteral stones. No evidence of focal renal
lesion. Partially distended urinary bladder without wall thickening
or stone.

Stomach/Bowel: Normal appendix, series 3, image 53. The stomach is
decompressed. Small duodenal diverticulum. There is no small bowel
obstruction or inflammation. Few fluid-filled loops of small bowel
in the pelvis without wall thickening or inflammation. No terminal
ileal inflammation. Small to moderate colonic stool burden.
Descending and sigmoid colonic diverticulosis. No diverticulitis or
colonic inflammation. Sigmoid colon is redundant.

Vascular/Lymphatic: Moderate aortic atherosclerosis. No aortic
aneurysm. Few scattered periportal nodes are likely reactive. No
enlarged lymph nodes in the abdomen or pelvis.

Reproductive: Normal sized prostate gland.  Urolith.

Other: No free air. No ascites. Tiny fat containing umbilical
hernia. Fat within both inguinal canals, right greater than left.

Musculoskeletal: Multilevel degenerative change throughout the
spine. Mild degenerative change of both hips. There are no acute or
suspicious osseous abnormalities.
IMPRESSION: 1. Distended gallbladder with slight indistinctness of gallbladder
wall, suspicious for acute cholecystitis. Recommend right upper
quadrant ultrasound for further evaluation.
2. No renal stones or obstructive uropathy. Normal appendix.
3. Colonic diverticulosis without diverticulitis.

Aortic Atherosclerosis ([id number]).

## 2023-04-04 DIAGNOSIS — M19011 Primary osteoarthritis, right shoulder: Secondary | ICD-10-CM | POA: Diagnosis not present

## 2023-04-04 DIAGNOSIS — M1712 Unilateral primary osteoarthritis, left knee: Secondary | ICD-10-CM | POA: Diagnosis not present

## 2023-06-19 ENCOUNTER — Other Ambulatory Visit: Payer: Self-pay | Admitting: Nurse Practitioner

## 2023-06-19 DIAGNOSIS — E042 Nontoxic multinodular goiter: Secondary | ICD-10-CM

## 2023-07-24 ENCOUNTER — Emergency Department (HOSPITAL_COMMUNITY)

## 2023-07-24 ENCOUNTER — Inpatient Hospital Stay (HOSPITAL_COMMUNITY)
Admission: EM | Admit: 2023-07-24 | Discharge: 2023-08-10 | DRG: 233 | Disposition: A | Discharge status: Skilled Nursing Facility | Attending: Thoracic Surgery (Cardiothoracic Vascular Surgery) | Admitting: Thoracic Surgery (Cardiothoracic Vascular Surgery)

## 2023-07-24 ENCOUNTER — Other Ambulatory Visit: Payer: Self-pay

## 2023-07-24 DIAGNOSIS — E1140 Type 2 diabetes mellitus with diabetic neuropathy, unspecified: Secondary | ICD-10-CM | POA: Diagnosis present

## 2023-07-24 DIAGNOSIS — R0602 Shortness of breath: Secondary | ICD-10-CM | POA: Diagnosis not present

## 2023-07-24 DIAGNOSIS — E1159 Type 2 diabetes mellitus with other circulatory complications: Secondary | ICD-10-CM | POA: Diagnosis not present

## 2023-07-24 DIAGNOSIS — Z789 Other specified health status: Secondary | ICD-10-CM

## 2023-07-24 DIAGNOSIS — J309 Allergic rhinitis, unspecified: Secondary | ICD-10-CM | POA: Diagnosis not present

## 2023-07-24 DIAGNOSIS — M1990 Unspecified osteoarthritis, unspecified site: Secondary | ICD-10-CM | POA: Diagnosis present

## 2023-07-24 DIAGNOSIS — I452 Bifascicular block: Secondary | ICD-10-CM | POA: Diagnosis present

## 2023-07-24 DIAGNOSIS — R0603 Acute respiratory distress: Secondary | ICD-10-CM

## 2023-07-24 DIAGNOSIS — Z951 Presence of aortocoronary bypass graft: Secondary | ICD-10-CM

## 2023-07-24 DIAGNOSIS — Z9049 Acquired absence of other specified parts of digestive tract: Secondary | ICD-10-CM

## 2023-07-24 DIAGNOSIS — Z9185 Personal history of military service: Secondary | ICD-10-CM

## 2023-07-24 DIAGNOSIS — Z7951 Long term (current) use of inhaled steroids: Secondary | ICD-10-CM

## 2023-07-24 DIAGNOSIS — R600 Localized edema: Secondary | ICD-10-CM | POA: Diagnosis not present

## 2023-07-24 DIAGNOSIS — Z794 Long term (current) use of insulin: Secondary | ICD-10-CM | POA: Diagnosis not present

## 2023-07-24 DIAGNOSIS — I4510 Unspecified right bundle-branch block: Secondary | ICD-10-CM | POA: Diagnosis present

## 2023-07-24 DIAGNOSIS — N390 Urinary tract infection, site not specified: Secondary | ICD-10-CM | POA: Diagnosis not present

## 2023-07-24 DIAGNOSIS — I5032 Chronic diastolic (congestive) heart failure: Secondary | ICD-10-CM | POA: Diagnosis not present

## 2023-07-24 DIAGNOSIS — I272 Pulmonary hypertension, unspecified: Secondary | ICD-10-CM | POA: Diagnosis not present

## 2023-07-24 DIAGNOSIS — E669 Obesity, unspecified: Secondary | ICD-10-CM | POA: Diagnosis present

## 2023-07-24 DIAGNOSIS — Z833 Family history of diabetes mellitus: Secondary | ICD-10-CM

## 2023-07-24 DIAGNOSIS — I739 Peripheral vascular disease, unspecified: Secondary | ICD-10-CM | POA: Diagnosis not present

## 2023-07-24 DIAGNOSIS — Z87891 Personal history of nicotine dependence: Secondary | ICD-10-CM | POA: Diagnosis not present

## 2023-07-24 DIAGNOSIS — I214 Non-ST elevation (NSTEMI) myocardial infarction: Principal | ICD-10-CM | POA: Diagnosis present

## 2023-07-24 DIAGNOSIS — I2510 Atherosclerotic heart disease of native coronary artery without angina pectoris: Secondary | ICD-10-CM | POA: Diagnosis present

## 2023-07-24 DIAGNOSIS — J810 Acute pulmonary edema: Secondary | ICD-10-CM | POA: Diagnosis not present

## 2023-07-24 DIAGNOSIS — E871 Hypo-osmolality and hyponatremia: Secondary | ICD-10-CM | POA: Diagnosis not present

## 2023-07-24 DIAGNOSIS — H903 Sensorineural hearing loss, bilateral: Secondary | ICD-10-CM | POA: Diagnosis not present

## 2023-07-24 DIAGNOSIS — G4700 Insomnia, unspecified: Secondary | ICD-10-CM | POA: Diagnosis present

## 2023-07-24 DIAGNOSIS — I130 Hypertensive heart and chronic kidney disease with heart failure and stage 1 through stage 4 chronic kidney disease, or unspecified chronic kidney disease: Secondary | ICD-10-CM | POA: Diagnosis present

## 2023-07-24 DIAGNOSIS — G4733 Obstructive sleep apnea (adult) (pediatric): Secondary | ICD-10-CM | POA: Diagnosis present

## 2023-07-24 DIAGNOSIS — E1169 Type 2 diabetes mellitus with other specified complication: Secondary | ICD-10-CM | POA: Diagnosis not present

## 2023-07-24 DIAGNOSIS — E11649 Type 2 diabetes mellitus with hypoglycemia without coma: Secondary | ICD-10-CM | POA: Diagnosis not present

## 2023-07-24 DIAGNOSIS — I10 Essential (primary) hypertension: Secondary | ICD-10-CM | POA: Diagnosis not present

## 2023-07-24 DIAGNOSIS — Z881 Allergy status to other antibiotic agents status: Secondary | ICD-10-CM

## 2023-07-24 DIAGNOSIS — I429 Cardiomyopathy, unspecified: Secondary | ICD-10-CM | POA: Diagnosis present

## 2023-07-24 DIAGNOSIS — Z823 Family history of stroke: Secondary | ICD-10-CM

## 2023-07-24 DIAGNOSIS — J9811 Atelectasis: Secondary | ICD-10-CM | POA: Diagnosis not present

## 2023-07-24 DIAGNOSIS — I255 Ischemic cardiomyopathy: Secondary | ICD-10-CM | POA: Diagnosis not present

## 2023-07-24 DIAGNOSIS — Z7901 Long term (current) use of anticoagulants: Secondary | ICD-10-CM

## 2023-07-24 DIAGNOSIS — E785 Hyperlipidemia, unspecified: Secondary | ICD-10-CM | POA: Diagnosis not present

## 2023-07-24 DIAGNOSIS — J9601 Acute respiratory failure with hypoxia: Secondary | ICD-10-CM | POA: Diagnosis present

## 2023-07-24 DIAGNOSIS — K219 Gastro-esophageal reflux disease without esophagitis: Secondary | ICD-10-CM | POA: Diagnosis not present

## 2023-07-24 DIAGNOSIS — Z79899 Other long term (current) drug therapy: Secondary | ICD-10-CM

## 2023-07-24 DIAGNOSIS — I4819 Other persistent atrial fibrillation: Secondary | ICD-10-CM

## 2023-07-24 DIAGNOSIS — I2584 Coronary atherosclerosis due to calcified coronary lesion: Secondary | ICD-10-CM | POA: Diagnosis not present

## 2023-07-24 DIAGNOSIS — J984 Other disorders of lung: Secondary | ICD-10-CM | POA: Diagnosis not present

## 2023-07-24 DIAGNOSIS — F1210 Cannabis abuse, uncomplicated: Secondary | ICD-10-CM | POA: Diagnosis present

## 2023-07-24 DIAGNOSIS — N182 Chronic kidney disease, stage 2 (mild): Secondary | ICD-10-CM | POA: Diagnosis not present

## 2023-07-24 DIAGNOSIS — F411 Generalized anxiety disorder: Secondary | ICD-10-CM | POA: Diagnosis present

## 2023-07-24 DIAGNOSIS — R918 Other nonspecific abnormal finding of lung field: Secondary | ICD-10-CM | POA: Diagnosis not present

## 2023-07-24 DIAGNOSIS — Z86718 Personal history of other venous thrombosis and embolism: Secondary | ICD-10-CM

## 2023-07-24 DIAGNOSIS — I959 Hypotension, unspecified: Secondary | ICD-10-CM | POA: Diagnosis not present

## 2023-07-24 DIAGNOSIS — J90 Pleural effusion, not elsewhere classified: Secondary | ICD-10-CM | POA: Diagnosis not present

## 2023-07-24 DIAGNOSIS — I340 Nonrheumatic mitral (valve) insufficiency: Secondary | ICD-10-CM | POA: Diagnosis not present

## 2023-07-24 DIAGNOSIS — I152 Hypertension secondary to endocrine disorders: Secondary | ICD-10-CM | POA: Diagnosis not present

## 2023-07-24 DIAGNOSIS — I4892 Unspecified atrial flutter: Secondary | ICD-10-CM | POA: Diagnosis not present

## 2023-07-24 DIAGNOSIS — Z9989 Dependence on other enabling machines and devices: Secondary | ICD-10-CM | POA: Diagnosis not present

## 2023-07-24 DIAGNOSIS — Z809 Family history of malignant neoplasm, unspecified: Secondary | ICD-10-CM

## 2023-07-24 DIAGNOSIS — Z91010 Allergy to peanuts: Secondary | ICD-10-CM

## 2023-07-24 DIAGNOSIS — Z8249 Family history of ischemic heart disease and other diseases of the circulatory system: Secondary | ICD-10-CM

## 2023-07-24 DIAGNOSIS — E1122 Type 2 diabetes mellitus with diabetic chronic kidney disease: Secondary | ICD-10-CM | POA: Diagnosis not present

## 2023-07-24 DIAGNOSIS — E119 Type 2 diabetes mellitus without complications: Secondary | ICD-10-CM

## 2023-07-24 DIAGNOSIS — F1010 Alcohol abuse, uncomplicated: Secondary | ICD-10-CM | POA: Diagnosis present

## 2023-07-24 DIAGNOSIS — Z7401 Bed confinement status: Secondary | ICD-10-CM | POA: Diagnosis not present

## 2023-07-24 DIAGNOSIS — E1151 Type 2 diabetes mellitus with diabetic peripheral angiopathy without gangrene: Secondary | ICD-10-CM | POA: Diagnosis not present

## 2023-07-24 DIAGNOSIS — I499 Cardiac arrhythmia, unspecified: Secondary | ICD-10-CM | POA: Diagnosis not present

## 2023-07-24 DIAGNOSIS — J4520 Mild intermittent asthma, uncomplicated: Secondary | ICD-10-CM | POA: Diagnosis not present

## 2023-07-24 DIAGNOSIS — Z48812 Encounter for surgical aftercare following surgery on the circulatory system: Secondary | ICD-10-CM | POA: Diagnosis not present

## 2023-07-24 DIAGNOSIS — Z888 Allergy status to other drugs, medicaments and biological substances status: Secondary | ICD-10-CM

## 2023-07-24 DIAGNOSIS — I480 Paroxysmal atrial fibrillation: Secondary | ICD-10-CM | POA: Diagnosis not present

## 2023-07-24 DIAGNOSIS — J449 Chronic obstructive pulmonary disease, unspecified: Secondary | ICD-10-CM | POA: Diagnosis present

## 2023-07-24 DIAGNOSIS — I440 Atrioventricular block, first degree: Secondary | ICD-10-CM | POA: Diagnosis present

## 2023-07-24 DIAGNOSIS — E1165 Type 2 diabetes mellitus with hyperglycemia: Secondary | ICD-10-CM | POA: Diagnosis not present

## 2023-07-24 DIAGNOSIS — J811 Chronic pulmonary edema: Secondary | ICD-10-CM | POA: Diagnosis not present

## 2023-07-24 DIAGNOSIS — R5383 Other fatigue: Secondary | ICD-10-CM | POA: Diagnosis not present

## 2023-07-24 DIAGNOSIS — Z01810 Encounter for preprocedural cardiovascular examination: Secondary | ICD-10-CM | POA: Diagnosis not present

## 2023-07-24 DIAGNOSIS — R0989 Other specified symptoms and signs involving the circulatory and respiratory systems: Secondary | ICD-10-CM | POA: Diagnosis not present

## 2023-07-24 DIAGNOSIS — Z452 Encounter for adjustment and management of vascular access device: Secondary | ICD-10-CM | POA: Diagnosis not present

## 2023-07-24 DIAGNOSIS — Z820 Family history of epilepsy and other diseases of the nervous system: Secondary | ICD-10-CM

## 2023-07-24 DIAGNOSIS — I509 Heart failure, unspecified: Secondary | ICD-10-CM | POA: Diagnosis not present

## 2023-07-24 DIAGNOSIS — R1013 Epigastric pain: Secondary | ICD-10-CM | POA: Diagnosis not present

## 2023-07-24 DIAGNOSIS — I5033 Acute on chronic diastolic (congestive) heart failure: Secondary | ICD-10-CM | POA: Diagnosis present

## 2023-07-24 DIAGNOSIS — F1090 Alcohol use, unspecified, uncomplicated: Secondary | ICD-10-CM

## 2023-07-24 DIAGNOSIS — Z6833 Body mass index (BMI) 33.0-33.9, adult: Secondary | ICD-10-CM

## 2023-07-24 DIAGNOSIS — Z9889 Other specified postprocedural states: Secondary | ICD-10-CM | POA: Diagnosis not present

## 2023-07-24 LAB — MAGNESIUM: Magnesium: 1.8 mg/dL (ref 1.7–2.4)

## 2023-07-24 LAB — COMPREHENSIVE METABOLIC PANEL WITH GFR
ALT: 19 U/L (ref 0–44)
AST: 26 U/L (ref 15–41)
Albumin: 3.2 g/dL — ABNORMAL LOW (ref 3.5–5.0)
Alkaline Phosphatase: 56 U/L (ref 38–126)
Anion gap: 11 (ref 5–15)
BUN: 16 mg/dL (ref 8–23)
CO2: 24 mmol/L (ref 22–32)
Calcium: 8.1 mg/dL — ABNORMAL LOW (ref 8.9–10.3)
Chloride: 96 mmol/L — ABNORMAL LOW (ref 98–111)
Creatinine, Ser: 0.96 mg/dL (ref 0.61–1.24)
GFR, Estimated: >60 mL/min (ref >60)
Glucose, Bld: 244 mg/dL — ABNORMAL HIGH (ref 70–99)
Potassium: 4.4 mmol/L (ref 3.5–5.1)
Sodium: 131 mmol/L — ABNORMAL LOW (ref 135–145)
Total Bilirubin: 1 mg/dL (ref 0.0–1.2)
Total Protein: 6.2 g/dL — ABNORMAL LOW (ref 6.5–8.1)

## 2023-07-24 LAB — HEMOGLOBIN A1C
Hgb A1c MFr Bld: 6.2 % — ABNORMAL HIGH (ref 4.8–5.6)
Mean Plasma Glucose: 131.24 mg/dL

## 2023-07-24 LAB — CBC
HCT: 50.4 % (ref 39.0–52.0)
Hemoglobin: 17 g/dL (ref 13.0–17.0)
MCH: 30 pg (ref 26.0–34.0)
MCHC: 33.7 g/dL (ref 30.0–36.0)
MCV: 88.9 fL (ref 80.0–100.0)
Platelets: 308 10*3/uL (ref 150–400)
RBC: 5.67 MIL/uL (ref 4.22–5.81)
RDW: 13.5 % (ref 11.5–15.5)
WBC: 10.3 10*3/uL (ref 4.0–10.5)
nRBC: 0 % (ref 0.0–0.2)

## 2023-07-24 LAB — BRAIN NATRIURETIC PEPTIDE: B Natriuretic Peptide: 243.3 pg/mL — ABNORMAL HIGH (ref 0.0–100.0)

## 2023-07-24 LAB — D-DIMER, QUANTITATIVE: D-Dimer, Quant: 0.59 ug{FEU}/mL — ABNORMAL HIGH (ref 0.00–0.50)

## 2023-07-24 MED ORDER — FLUTICASONE PROPIONATE 50 MCG/ACT NA SUSP
2.0000 | Freq: Every day | NASAL | Status: DC
  Administered 2023-07-25 – 2023-08-10 (×15): 2 via NASAL
  Filled 2023-07-24 (×2): qty 16

## 2023-07-24 MED ORDER — INSULIN GLARGINE-YFGN 100 UNIT/ML ~~LOC~~ SOLN
20.0000 [IU] | Freq: Every day | SUBCUTANEOUS | Status: DC
  Administered 2023-07-25: 20 [IU] via SUBCUTANEOUS
  Filled 2023-07-24: qty 0.2

## 2023-07-24 MED ORDER — THIAMINE MONONITRATE 100 MG PO TABS
100.0000 mg | ORAL_TABLET | Freq: Every day | ORAL | Status: DC
  Administered 2023-07-24 – 2023-08-10 (×16): 100 mg via ORAL
  Filled 2023-07-24 (×17): qty 1

## 2023-07-24 MED ORDER — FUROSEMIDE 10 MG/ML IJ SOLN: 40.0000 mg | Freq: Once | INTRAMUSCULAR | Status: DC

## 2023-07-24 MED ORDER — ACETAMINOPHEN 500 MG PO TABS
1000.0000 mg | ORAL_TABLET | Freq: Four times a day (QID) | ORAL | Status: DC | PRN
  Administered 2023-07-24 – 2023-07-29 (×10): 1000 mg via ORAL
  Filled 2023-07-24 (×10): qty 2

## 2023-07-24 MED ORDER — ACETAMINOPHEN 650 MG RE SUPP: 650.0000 mg | Freq: Four times a day (QID) | RECTAL | Status: DC | PRN

## 2023-07-24 MED ORDER — FUROSEMIDE 10 MG/ML IJ SOLN
40.0000 mg | Freq: Once | INTRAMUSCULAR | Status: AC
  Administered 2023-07-24: 40 mg via INTRAVENOUS
  Filled 2023-07-24: qty 4

## 2023-07-24 MED ORDER — FOLIC ACID 1 MG PO TABS
1.0000 mg | ORAL_TABLET | Freq: Every day | ORAL | Status: DC
  Administered 2023-07-24 – 2023-08-10 (×17): 1 mg via ORAL
  Filled 2023-07-24 (×17): qty 1

## 2023-07-24 MED ORDER — UMECLIDINIUM BROMIDE 62.5 MCG/ACT IN AEPB
1.0000 | INHALATION_SPRAY | Freq: Every day | RESPIRATORY_TRACT | Status: DC
  Administered 2023-07-24 – 2023-07-26 (×2): 1 via RESPIRATORY_TRACT
  Filled 2023-07-24: qty 7

## 2023-07-24 MED ORDER — POLYETHYLENE GLYCOL 3350 17 G PO PACK
17.0000 g | PACK | Freq: Every day | ORAL | Status: DC | PRN
  Administered 2023-07-26 – 2023-07-29 (×4): 17 g via ORAL
  Filled 2023-07-24 (×5): qty 1

## 2023-07-24 MED ORDER — ADULT MULTIVITAMIN W/MINERALS CH
1.0000 | ORAL_TABLET | Freq: Every day | ORAL | Status: DC
  Administered 2023-07-24 – 2023-08-10 (×17): 1 via ORAL
  Filled 2023-07-24 (×17): qty 1

## 2023-07-24 MED ORDER — TERAZOSIN HCL 1 MG PO CAPS
2.0000 mg | ORAL_CAPSULE | Freq: Every day | ORAL | Status: DC
  Administered 2023-07-24 – 2023-08-09 (×16): 2 mg via ORAL
  Filled 2023-07-24 (×18): qty 2

## 2023-07-24 MED ORDER — HYDROXYZINE HCL 10 MG PO TABS
25.0000 mg | ORAL_TABLET | Freq: Two times a day (BID) | ORAL | Status: DC | PRN
  Administered 2023-07-25 – 2023-08-10 (×9): 25 mg via ORAL
  Filled 2023-07-24 (×9): qty 3

## 2023-07-24 MED ORDER — FELODIPINE ER 5 MG PO TB24: 10.0000 mg | ORAL_TABLET | Freq: Every day | ORAL | Status: DC

## 2023-07-24 MED ORDER — SODIUM CHLORIDE 0.9% FLUSH
3.0000 mL | Freq: Two times a day (BID) | INTRAVENOUS | Status: DC
  Administered 2023-07-24 – 2023-07-30 (×6): 3 mL via INTRAVENOUS

## 2023-07-24 MED ORDER — CARVEDILOL 25 MG PO TABS
25.0000 mg | ORAL_TABLET | Freq: Two times a day (BID) | ORAL | Status: DC
  Administered 2023-07-24 – 2023-07-30 (×12): 25 mg via ORAL
  Filled 2023-07-24 (×12): qty 1

## 2023-07-24 MED ORDER — THIAMINE HCL 100 MG/ML IJ SOLN
100.0000 mg | Freq: Every day | INTRAMUSCULAR | Status: DC
  Filled 2023-07-24 (×2): qty 2

## 2023-07-24 MED ORDER — TRAZODONE HCL 50 MG PO TABS
100.0000 mg | ORAL_TABLET | Freq: Every day | ORAL | Status: DC
  Administered 2023-07-24 – 2023-08-09 (×17): 100 mg via ORAL
  Filled 2023-07-24 (×18): qty 2

## 2023-07-24 MED ORDER — OLOPATADINE HCL 0.1 % OP SOLN
1.0000 [drp] | Freq: Two times a day (BID) | OPHTHALMIC | Status: DC
  Administered 2023-07-24 – 2023-08-10 (×33): 1 [drp] via OPHTHALMIC
  Filled 2023-07-24 (×2): qty 5

## 2023-07-24 MED ORDER — ACETAMINOPHEN 650 MG RE SUPP
650.0000 mg | Freq: Four times a day (QID) | RECTAL | Status: DC | PRN
Start: 2023-07-24 — End: 2023-07-24

## 2023-07-24 MED ORDER — ACETAMINOPHEN 325 MG PO TABS: 650.0000 mg | ORAL_TABLET | Freq: Four times a day (QID) | ORAL | Status: DC | PRN

## 2023-07-24 MED ORDER — POTASSIUM CHLORIDE CRYS ER 20 MEQ PO TBCR
20.0000 meq | EXTENDED_RELEASE_TABLET | Freq: Once | ORAL | Status: AC
  Administered 2023-07-24: 20 meq via ORAL
  Filled 2023-07-24: qty 1

## 2023-07-24 MED ORDER — CROMOLYN SODIUM 4 % OP SOLN
1.0000 [drp] | Freq: Four times a day (QID) | OPHTHALMIC | Status: DC | PRN
  Administered 2023-08-06 – 2023-08-08 (×2): 1 [drp] via OPHTHALMIC
  Filled 2023-07-24: qty 10

## 2023-07-24 MED ORDER — APIXABAN 5 MG PO TABS
5.0000 mg | ORAL_TABLET | Freq: Two times a day (BID) | ORAL | Status: DC
  Administered 2023-07-24 – 2023-07-25 (×2): 5 mg via ORAL
  Filled 2023-07-24 (×2): qty 1

## 2023-07-24 MED ORDER — INSULIN ASPART 100 UNIT/ML IJ SOLN
0.0000 [IU] | Freq: Three times a day (TID) | INTRAMUSCULAR | Status: DC
  Administered 2023-07-24: 5 [IU] via SUBCUTANEOUS
  Administered 2023-07-25: 3 [IU] via SUBCUTANEOUS
  Administered 2023-07-25 – 2023-07-26 (×2): 5 [IU] via SUBCUTANEOUS

## 2023-07-24 MED ORDER — IPRATROPIUM-ALBUTEROL 0.5-2.5 (3) MG/3ML IN SOLN: 3.0000 mL | Freq: Four times a day (QID) | RESPIRATORY_TRACT | Status: DC | PRN

## 2023-07-24 MED ORDER — MAGNESIUM SULFATE 2 GM/50ML IV SOLN
2.0000 g | Freq: Once | INTRAVENOUS | Status: AC
  Administered 2023-07-24: 2 g via INTRAVENOUS
  Filled 2023-07-24: qty 50

## 2023-07-24 NOTE — Hospital Course (Addendum)
 History of Present Illness:     [name] is a pleasant 73 year old gentleman with a past history of dyslipidemia intolerant of statins, obesity alcohol and cannabis abuse, hypertension, paroxysmal atrial fibrillation on Eliquis , remote history of DVT and former tobacco smoker having quit in 1979.  He was in [employer] on 07/24/2023 when he developed acute onset severe shortness of breath.  EMS was summoned.  He was noted to have a oxygen  saturation of 57%.  He was placed on BiPAP and brought to the emergency room.  In the ED, chest x-ray showed asymmetric pulmonary edema versus pneumonia.  Initial high-sensitivity troponin was over 7000.  EKG showed sinus tachycardia with first-degree AV block, right bundle blanch block, and anterior T wave inversions. He was ruled in for NSTEMI.  [name] was admitted to the hospital and diuresed aggressively with good response.  Serial high-sensitivity troponins remained elevated.  He was taken to the Cath Lab earlier today where coronary angiography demonstrated severe three-vessel coronary artery disease.  CT surgery has been asked to evaluate for consideration of coronary bypass grafting. [name] is retired from his previous occupation as a Chartered certified accountant.  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 He endorses frequent chest tightness and shortness of breath with activities like walking up a hill or upstairs.  Hospital Course: The patient was admitted to [facility] and cardiothoracic surgery was consulted. Dr. Luna Salinas reviewed the diagnostic studies and determined he would benefit from surgical intervention. He reviewed the treatment options as well as the risks and benefits of surgery, [name] was agreeable to proceed with surgery. He remained stable while in the hospital and was brought to the operating room on 07/30/23. He underwent CABG x 3 utilizing LIMA to LAD, SVG to PDA with coronary endarterectomy,  and SVG to OM1, as well as endoscopic harvest of the right greater saphenous vein. He tolerated the procedure well and was transferred to the SICU in stable condition. He was extubated early morning POD1 without complication. He required Milrinone , epinephrine , and neosynephrine for hemodynamic support. This was weaned as hemodynamics tolerated. Swan ganz catheter, arterial line, and chest tubes were removed without complication on POD1. The patient had trouble balancing due to neuropathy.  Physical therapy consult was obtained and recommended CIR.  He will be transitioned to home regimen of Coreg .  Home Eliquis  was resumed for PAF.  He was in NSR and his pacing wires were removed without difficulty.  The patient did not respond much to oral Lasix . This was changed to IV dosing with IV Lasix  40mg  BID. His chest xray showed increased atelectasis and left pleural effusion.  He was instructed on good use of IS, flutter valve was added as well as mucinex . TED hose were also placed. Oxygen  was weaned as tolerated. He developed atrial fibrillation with controlled rates. He had minimal response to the IV Lasix  so Zaroxolyn  5mg  daily was added with some improvement. Oxygen  was weaned and he began saturating well on room air. He was diuresing well, IV Lasix  was transitioned to PO Lasix . He was started on low dose Entresto  but developed hypotension so this was discontinued. He was found to have a UTI and was given Fosfomycin as discussed with pharmacy, culture was pending. He had acute on chronic hyponatremia, diuresis was held and he continued to diurese well independently. He was ambulating well on room air and incisions were healing well without sign of infection. He was felt stable for  discharge to SNF on 06/12 but his daughters had not yet chosen a facility. SNF was arranged and he was discharged on 06/13.

## 2023-07-24 NOTE — Assessment & Plan Note (Signed)
 Last A1c on record June 2022 is 7.3.  Does have CGM. Home regimen: Lantus  20 units BID, NOT taking Jardiance due to side effects (angioedema?) - CBGs ACHS (does have CGM, but per pharmacy prefer to still do CBGs) - Moderate SSI and carb modified diet - Restart LAI 20 units daily, increase to 20 BID based on CBGs, though consider consolidating into once daily dose - Follow up A1c and lipid panel

## 2023-07-24 NOTE — Plan of Care (Signed)
 FMTS Brief Progress Note  S: Patient is awake and alert resting comfortably in bed on exam.  He reports some neck and back pain which is chronic related to his spinal stenosis.   O: BP (!) 149/88 (BP Location: Right Arm)   Pulse 82   Temp 98.2 F (36.8 C) (Axillary)   Resp 18   SpO2 100%   General: Well-appearing elderly gentleman, no distress Cardiac: RRR, no M/R/G Respiratory: CTAB, no increased work of breathing Extremities: No evidence of peripheral edema in the bilateral lower extremity  A/P: CHF exacerbation Stable, continue diuresis as described in H&P. - Orders reviewed. Labs for AM ordered, which was adjusted as needed.  - If condition changes, plan includes rapid reassessment.   Rayma Calandra, DO 07/24/2023, 8:42 PM PGY-1, [facility] Night Resident  Please page [phone] with questions.

## 2023-07-24 NOTE — Assessment & Plan Note (Signed)
 Reportedly drinks 3 beers nightly, last drink was last night.  No history of WD seizures or alcohol WD. - CIWAs Q6h - Thiamine , folate, and MVI daily - Consult to [facility]

## 2023-07-24 NOTE — ED Notes (Signed)
 Called CCMD to put pt on monitor

## 2023-07-24 NOTE — Assessment & Plan Note (Addendum)
 Insomnia: Continue home trazodone 100 mg QHS and hydroxyzine  25 mg BID HTN: As per AHRF above HLD: Not on ezetimibe due to intolerance Allergies: Fluticasone  daily, olopatadine  eye drops BID OSA: BiPAP per RT nightly CKD2: Recently diagnosed, Cr stable on admission

## 2023-07-24 NOTE — ED Provider Notes (Signed)
 [place] EMERGENCY DEPARTMENT AT [facility] Provider Note   CSN: [id number] Arrival date & time: 07/24/23  1144     History  Chief Complaint  Patient presents with   Respiratory Distress    [name]. is a 73 y.o. male.  Patient complains of shortness of breath.  EMS reports that patient called them due to shortness of breath.  EMS reports patient had wheezing in all lobes they gave patient a albuterol  neb with some relief.  Patient started on BiPAP.  They report patient's initial O2 sat was 57%.  Patient has a past medical history of hypertension and hypertension.  Pt reported to be out of albuterol .  Pt reports he is taking lasix .  Pt denies fever or chills.  No cough, no uri symptoms.    The history is provided by the patient. No language interpreter was used.       Home Medications Prior to Admission medications   Medication Sig Start Date End Date Taking? Authorizing Provider  Azelastine HCl 137 MCG/SPRAY SOLN Place 1 spray into both nostrils 2 (two) times daily.  06/20/19   [provider]  carvedilol  (COREG ) 25 MG tablet Take 25 mg by mouth 2 (two) times daily. 09/19/19   [provider]  Cholecalciferol (DIALYVITE VITAMIN D 5000 PO) Take 5,000 Units by mouth every Monday, Wednesday, and Friday. Takes at night    [provider]  Coenzyme Q10 (COQ10) 100 MG CAPS Take 100 mg by mouth daily.    [provider]  cromolyn  (OPTICROM ) 4 % ophthalmic solution Place 1 drop into both eyes 4 (four) times daily as needed (itchy eyes).    [provider]  felodipine  (PLENDIL ) 10 MG 24 hr tablet Take 10 mg by mouth daily. 01/13/20   [provider]  furosemide  (LASIX ) 20 MG tablet Take 20 mg by mouth daily.    [provider]  Glucosamine-Chondroit-Vit C-Mn (GLUCOSAMINE 1500 COMPLEX PO) Take 1 tablet by mouth daily.    [provider]  hydrALAZINE  (APRESOLINE ) 10 MG tablet Take 10 mg by mouth 2  (two) times daily.    [provider]  hydrOXYzine  (VISTARIL ) 25 MG capsule Take 25 mg by mouth 2 (two) times daily as needed for anxiety.    [provider]  insulin  aspart (NOVOLOG ) 100 UNIT/ML injection Inject 0-8 Units into the skin 3 (three) times daily as needed for high blood sugar.     [provider]  insulin  glargine (LANTUS ) 100 UNIT/ML injection Inject 15 Units into the skin 2 (two) times daily.    [provider]  MAGNESIUM  OXIDE PO Take 500 mg by mouth at bedtime.    [provider]  Multiple Vitamin (MULTIVITAMIN WITH MINERALS) TABS Take 1 tablet by mouth every evening.     [provider]  neomycin-polymyxin-hydrocortisone (CORTISPORIN) OTIC solution Place 4 drops into the right ear 4 (four) times daily as needed (itchy ears).    [provider]  olopatadine  (PATANOL) 0.1 % ophthalmic solution Place 1 drop into both eyes 2 (two) times daily.    [provider]  Omega-3 Fatty Acids (FISH OIL PO) Take 1,200 mg by mouth every evening.    [provider]  OVER THE COUNTER MEDICATION Take 1 capsule by mouth at bedtime. Oil of Oregano    [provider]  oxyCODONE  (OXY IR/ROXICODONE ) 5 MG immediate release tablet Take 1 tablet (5 mg total) by mouth every 6 (six) hours as needed  for severe pain. 09/08/20   Dareen Ebbing, MD  polyethylene glycol powder (GLYCOLAX /MIRALAX ) 17 GM/SCOOP powder Take 34 g by mouth at bedtime. 10/06/19   [provider]  potassium gluconate 595 MG TABS Take 595 mg by mouth every evening.     [provider]  Probiotic Product (PROBIOTIC DAILY PO) Take 2 capsules by mouth in the morning and at bedtime.    [provider]  psyllium (METAMUCIL) 58.6 % powder Take 1 packet by mouth daily.    [provider]  tadalafil (CIALIS) 20 MG tablet Take 20 mg by mouth daily as needed for erectile dysfunction.    [provider]  TURMERIC PO Take 1  tablet by mouth every evening. With Ginger    [provider]  Vitamin A 2400 MCG (8000 UT) CAPS Take 8,000 Units by mouth daily.    [provider]  vitamin B-12 (CYANOCOBALAMIN) 500 MCG tablet Take 500 mcg by mouth daily.    [provider]      Allergies    Ace inhibitors, Ciprofloxacin , Gabapentin, Peanut oil, Tamiflu [oseltamivir phosphate], Atorvastatin, Lisinopril , Losartan, Lyrica [pregabalin], Oxcarbazepine, Simvastatin, Statins, Flagyl  [metronidazole ], Remeron [mirtazapine], Victoza [liraglutide], Avelox [moxifloxacin hcl in nacl], and Citalopram    Review of Systems   Review of Systems  All other systems reviewed and are negative.   Physical Exam Updated Vital Signs BP (!) 160/84 (BP Location: Right Arm)   Pulse (!) 101   Temp 98.4 F (36.9 C) (Axillary)   Resp (!) 29   SpO2 97%  Physical Exam Vitals and nursing note reviewed.  Constitutional:      Appearance: He is well-developed.  HENT:     Head: Normocephalic.     Mouth/Throat:     Mouth: Mucous membranes are moist.  Eyes:     Pupils: Pupils are equal, round, and reactive to light.  Cardiovascular:     Rate and Rhythm: Normal rate.  Pulmonary:     Breath sounds: Wheezing and rales present.  Abdominal:     General: There is no distension.  Musculoskeletal:        General: Normal range of motion.     Cervical back: Normal range of motion.  Skin:    General: Skin is warm.  Neurological:     General: No focal deficit present.     Mental Status: He is alert and oriented to person, place, and time.  Psychiatric:        Mood and Affect: Mood normal.     ED Results / Procedures / Treatments   Labs (all labs ordered are listed, but only abnormal results are displayed) Labs Reviewed  MAGNESIUM   BRAIN NATRIURETIC PEPTIDE  CBC  COMPREHENSIVE METABOLIC PANEL WITH GFR    EKG EKG Interpretation Date/Time:  Tuesday Jul 24 2023 11:54:48 EDT Ventricular Rate:  103 PR  Interval:  147 QRS Duration:  143 QT Interval:  345 QTC Calculation: 452 R Axis:   55  Text Interpretation: Sinus tachycardia Right bundle branch block Since last tracing rate faster Confirmed by Sueellen Emery [phone]) on 07/24/2023 12:21:37 PM  Radiology DG Chest Portable 1 View Result Date: 07/24/2023 CLINICAL DATA:  Shortness of breath. EXAM: PORTABLE CHEST 1 VIEW COMPARISON:  Chest radiograph dated 09/07/2019. FINDINGS: Diffuse interstitial densities as well as diffuse airspace density throughout the left lung may represent asymmetric edema, pneumonia, or combination. Clinical correlation recommended. No pleural effusion pneumothorax. The cardiac silhouette is within normal limits. No acute osseous pathology IMPRESSION: Asymmetric  edema, pneumonia, or combination. Electronically Signed   By: Angus Bark M.D.   On: 07/24/2023 14:32    Procedures .Critical Care  Performed by: Sandi Crosby, PA-C Authorized by: Sandi Crosby, PA-C   Critical care provider statement:    Critical care time (minutes):  45   Critical care start time:  07/24/2023 12:00 PM   Critical care end time:  07/24/2023 2:43 PM   Critical care time was exclusive of:  Separately billable procedures and treating other patients and teaching time   Critical care was necessary to treat or prevent imminent or life-threatening deterioration of the following conditions:  Cardiac failure and respiratory failure   Critical care was time spent personally by me on the following activities:  Development of treatment plan with patient or surrogate, discussions with consultants, evaluation of patient's response to treatment, interpretation of cardiac output measurements, obtaining history from patient or surrogate, pulse oximetry, re-evaluation of patient's condition, ordering and review of radiographic studies, ordering and review of laboratory studies and ordering and performing treatments and interventions   Care discussed  with: admitting provider       Medications Ordered in ED Medications  ipratropium-albuterol (DUONEB) 0.5-2.5 (3) MG/3ML nebulizer solution 3 mL (has no administration in time range)  potassium chloride SA (KLOR-CON M) CR tablet 20 mEq (has no administration in time range)    ED Course/ Medical Decision Making/ A&P                                 Medical Decision Making Pt complains of shortness of breath.  Pt is out of albuterol  EMs started pt on bipap  Amount and/or Complexity of Data Reviewed Independent Historian: EMS    Details: Ems [place] [employer].  Pt called because he is short of breath  External Data Reviewed: notes.    Details: [facility] reviewed  Labs: ordered. Decision-making details documented in ED Course.    Details: Labs ordered reviewed and interpreted.  BNP is 243.  Glucose is 204 Radiology: ordered and independent interpretation performed. Decision-making details documented in ED Course.    Details: Chest xray shows asymmetric edema  Discussion of management or test interpretation with external provider(s): Pt improving with bipap   Risk Prescription drug management. Risk Details: Pt improving on bipap.  Pt given lasix  40 mg IV.  Potassium 20 meq po.  Unassigned medicine consulted for admiions   Critical Care Total time providing critical care: 45 minutes           Final Clinical Impression(s) / ED Diagnoses Final diagnoses:  Acute pulmonary edema [facility])  Respiratory distress    Rx / DC Orders ED Discharge Orders     None         Evelyn Hire 07/24/23 1445    Sueellen Emery, MD 07/24/23 [phone]

## 2023-07-24 NOTE — H&P (Addendum)
 Hospital Admission History and Physical Service Pager: [phone]  Patient name: [name]. Medical record number: [id number] Date of Birth: [birth year] Age: 73 y.o. Gender: male  Primary Care Provider: [facility], [facility] Consultants: None Code Status: FULL, which was confirmed with patient at bedside Preferred Emergency Contact: Daughter Contact Information   None on File    Other Contacts     Name Relation Home Work Mobile   [name] Daughter [phone]  [phone]   Alan, Cruz [phone]     [facility] Daughter [phone]         Chief Complaint: Dyspnea  Assessment and Plan: [name]. is a 73 y.o. male presenting with dyspnea requiring BiPAP via EMS.  Differential for this patient's presentation includes CHF exacerbation, COPD exacerbation, and pulmonary embolism.  Given the patient's elevated BNP, physical exam, and CXR pulmonary edema, leading differential is CHF exacerbation.  While the patient does deny history of heart failure, do note the patient's combination of Lasix  daily, carvedilol , Entresto, and Jardiance trial outpatient.  Given inability to verify heart failure diagnosis, will likely need to consult cardiology after echocardiogram.  Surprisingly acute onset of severe dyspnea at [employer], however without any acute triggers identified, suspect gradual onset pulmonary edema resulting in sudden rapid decompensation.  COPD exacerbation less likely given the that the patient does not have productive cough or wheezing on exam.  However, the combination of COPD and OSA is likely resulted in limited respiratory reserve at baseline for this patient.  Must also consider pulmonary embolism given sinus tachycardia and acute onset dyspnea on arrival.  Less likely given patient's adherence to daily Eliquis, but will obtain D-dimer to risk stratify. Assessment & Plan Acute hypoxemic respiratory failure (HCC) Favor CHF  exacerbation, but patient denies history CHF and [facility] to view [facility] records.  BNP elevated to 243.3 and pulmonary edema on CXR.  Also considering possible pulmonary embolism given sinus tach and acute onset dyspnea, but reassuringly compliant with Eliquis.  Likely limited respiratory reserve at baseline with COPD and OSA. Home regimen: Furosemide  20 mg daily, carvedilol  25 mg BID, supposedly Entresto 97-103 mg BID though history of angioedema with ACEi, not taking Jardiance - Admit to [facility] inpatient, Med Tele unit, attending Dr. Rumball - Hold home Entresto (angioedema), Jardiance (not taking), restart carvedilol  25 mg BID - s/p Lasix  40 mg IV in ED, redose tomorrow - Strict I&Os, daily weights, and heart healthy diet - Continuous cardiac monitoring and pulse oximetry - D-dimer to risk stratify - TSH given tachycardia - f/u TTE, then plan to consult Cardiology tomorrow AM - AM BMP, Mag; goal K>4, Mag>2 COPD (chronic obstructive pulmonary disease) (HCC) Do not favor exacerbation as no history of cough and no wheeze on exam. Home regimen: Albuterol PRN (using a few times monthly) and Spiriva Respimat daily - DuoNebs Q6h PRN - Start Incruse Ellipta (Spiriva Respimat formulary equivalent) daily - O2 sat goa 88-92%, BiPAP nightly for OSA - Recommend marijuana cessation or at least discontinuing smoking inhalation Alcohol use Reportedly drinks 3 beers nightly, last drink was last night.  No history of WD seizures or alcohol WD. - CIWAs Q6h - Thiamine , folate, and MVI daily - Consult to TOC T2DM (type 2 diabetes mellitus) (HCC) Last A1c on record June 2022 is 7.3.  Does have CGM. Home regimen: Lantus  20 units BID, NOT taking Jardiance due to side effects (angioedema?) - CBGs ACHS (does have CGM, but per pharmacy prefer to still do CBGs) - Moderate  SSI and carb modified diet - Restart LAI 20 units daily, increase to 20 BID based on CBGs, though consider consolidating into once daily dose - Follow  up A1c and lipid panel Chronic health problem Insomnia: Continue home trazodone 100 mg QHS and hydroxyzine  25 mg BID HTN: As per AHRF above HLD: Not on ezetimibe due to intolerance Allergies: Fluticasone  daily, olopatadine  eye drops BID OSA: BiPAP per RT nightly CKD2: Recently diagnosed, Cr stable on admission  FEN/GI: Heart health, carb modified diet; PIV saline locked VTE Prophylaxis: Eliquis 5 mg BID   Disposition: Med-tele  History of Present Illness: [name]. is a 73 y.o. male with a pertinent PMH of T2DM, A-fib on Eliquis, HTN, HLD, RBBB, and OSA on CPAP presenting with acute onset dyspnea via EMS.  The patient called EMS while at [employer] this morning because he could not breathe.  No exposure to aerosols in [employer].  This was an acute onset episode, no prior dyspnea.  Felt fine this AM.  No recent fevers, chills, sick symptoms.  No increased cough, no oxygen  requirement at home.  Did smoke marijuana this AM.  May have missed Spiriva this AM.  Never carries rescue inhaler and did not have it with him in the [employer].  Denies history of heart failure.  Endorses residual dyspnea with exertion since having pneumonia 2 years ago.  Able to ambulate without difficulty around grocery store at baseline, does not need to stop while walking too often, but does notice intermittent.  Sleeps with 1 pillow nightly, does wake up at night but due to insomnia and not shortness of breath.  Uses albuterol rescue inhaler a few times a month.  [facility], supposed to get a nuclear stress test in July, then echocardiogram.  Uses CPAP nightly, daughter to bring nose pillow.  Not much improvement with DuoNeb upon EMS arrival and started on BiPAP en route to ED.  Continued BiPAP in ED with a few more DuoNebs.  BNP elevated and CXR showing pulmonary edema.  Lasix  40 mg IV administered with urine output and patient weaned to 3 L [place].  FMTS consuted for admission for respiratory  failure.  Review Of Systems: Per HPI.   Pertinent Past Medical History: - DVT 2006 or 2007 - T2DM - Diverticulosis - HLD - HTN - Neuropathy - PAD - Hx RBBB - OSA on CPAP, compliant - Subdural hematoma 2010 - CKD stage II  Remainder reviewed in history tab.    Pertinent Past Surgical History: - Cholecystectomy 2022 - Rotator cuff repair L 2017 - Subdural hematoma surgery 2010 - Bilateral cataract extraction 2017 - Tonsillectomy  Remainder reviewed in history tab.   Pertinent Social History: Tobacco use: Quit smoking tobacco in 1979 (smoked without filter) Alcohol use: Smokes a few tokes of marijuana each night (has smoked since he was 16) Other Substance use: No other substance (used cocaine in 1980s in [place] ) Lives with wife (has Parkinson's), daughter helps out   Pertinent Family History: - CVA in father - Mother with Alzheimer's - Brother with diabetes  Remainder reviewed in history tab.    Important Outpatient Medications:  Abixaban 5 mg BID for Afib Carvedilol  25 mg BID Furosemide  20 mg daily for leg swelling (NOT PRN) Hydroxyzine  25 mg one tab at night for insomnia Glargine 20 units BID Sacubitril 97 mg/Valsartan 103 mg BID Empagliflozin 25 mg - NOT taking Terazosin  2 mg daily QHS Tiotropium 2 puffs once daily Tadalafil 20 mg  PRN Trazodone 100 mg QHS Fluticasone  daily PRN Olopatidine eye drops PRN  Remainder reviewed in medication history.    Objective: BP 123/66   Pulse 79   Temp 98.4 F (36.9 C) (Axillary)   Resp (!) 21   SpO2 100%   Physical Exam: General: Appears stated age.  NAD, resting in bed comfortably.  Alert and at baseline. Eyes: No scleral icterus or injection. ENTM: MMM. Neck: Difficult to assess JVD, possible 2 cm above clavicle bilaterally. Cardiovascular: RRR.  Normal S1/S2.  Multiple soft systolic murmur, no rubs or gallops. Respiratory: Significant left lower lung crackles, no wheezing or rhonchi grossly.   Normal work of breathing on 3 L [place]. Gastrointestinal: Soft pannus without tenderness to palpation all quadrants.  No fluid wave. MSK: Moving all extremities appropriately. Extremities: Trace ankle edema, chronic venous stasis skin changes overlying bilateral lower extremities.  Cap refill 2 seconds. Derm: No rash description. Neuro: Alert and oriented x 4 Psych: Full range affect, pleasant, appropriate.  Labs:  CBC BMET  Recent Labs  Lab 07/24/23 1224  WBC 10.3  HGB 17.0  HCT 50.4  PLT 308   Recent Labs  Lab 07/24/23 1224  NA 131*  K 4.4  CL 96*  CO2 24  BUN 16  CREATININE 0.96  GLUCOSE 244*  CALCIUM  8.1*     Pertinent additional labs: -BNP 243.3  EKG: Borderline sinus tachycardia with RBBB.  No ischemic changes.  QTcB 452.  Imaging Studies: -CXR: Pulmonary edema R>L, possible underlying consolidation but less likely given clinical correlation and distinct cardiac border.  Macoy Rodwell, MD 07/24/2023, 2:51 PM PGY-1, [facility]  FPTS Intern pager: [phone], text pages welcome Secure chat group [facility] Teaching Service

## 2023-07-24 NOTE — Assessment & Plan Note (Signed)
 Do not favor exacerbation as no history of cough and no wheeze on exam. Home regimen: Albuterol PRN (using a few times monthly) and Spiriva Respimat daily - DuoNebs Q6h PRN - Start Incruse Ellipta (Spiriva Respimat formulary equivalent) daily - O2 sat goa 88-92%, BiPAP nightly for OSA - Recommend marijuana cessation or at least discontinuing smoking inhalation

## 2023-07-24 NOTE — Assessment & Plan Note (Signed)
 Favor CHF exacerbation, but patient denies history CHF and unable to view [facility].  BNP elevated to 243.3 and pulmonary edema on CXR.  Also considering possible pulmonary embolism given sinus tach and acute onset dyspnea, but reassuringly compliant with Eliquis .  Likely limited respiratory reserve at baseline with COPD and OSA. Home regimen: Furosemide  20 mg daily, carvedilol  25 mg BID, supposedly Entresto 97-103 mg BID though history of angioedema with ACEi, not taking Jardiance - Admit to [facility] inpatient, Med Tele unit, attending Dr. Rumball - Hold home Entresto (angioedema), Jardiance (not taking), restart carvedilol  25 mg BID - s/p Lasix  40 mg IV in ED, redose tomorrow - Strict I&Os, daily weights, and heart healthy diet - Continuous cardiac monitoring and pulse oximetry - D-dimer to risk stratify - TSH given tachycardia - f/u TTE, then plan to consult Cardiology tomorrow AM - AM BMP, Mag; goal K>4, Mag>2

## 2023-07-24 NOTE — ED Triage Notes (Addendum)
 BIB [facility] [employer] for [place]. HX CHF asthma, hypertension.  57% on RA,  RR 40, Hr120s, 200s Systolic . Inspiratory & Expiratory Wheezing, duoneb given. 2 nitroglycerin given en route 18 G L hand

## 2023-07-24 NOTE — TOC CM/SW Note (Signed)
 SW attached substance abuse resources to patient's AVS.   .Lily Peer, MSW, LCSWA Transition of Care  Clinical Social Worker (ED 3-11 Mon-Fri)  [phone]

## 2023-07-24 NOTE — ED Notes (Signed)
 Patient ambulated to the restroom across the hall on room air. Placed on 3L when returned. No distress noted. O2 sats 100%

## 2023-07-25 ENCOUNTER — Inpatient Hospital Stay (HOSPITAL_COMMUNITY)

## 2023-07-25 ENCOUNTER — Encounter (HOSPITAL_COMMUNITY): Payer: Self-pay | Admitting: Family Medicine

## 2023-07-25 DIAGNOSIS — J9601 Acute respiratory failure with hypoxia: Secondary | ICD-10-CM | POA: Diagnosis not present

## 2023-07-25 DIAGNOSIS — R0603 Acute respiratory distress: Secondary | ICD-10-CM | POA: Diagnosis not present

## 2023-07-25 LAB — BASIC METABOLIC PANEL WITH GFR
Anion gap: 10 (ref 5–15)
BUN: 21 mg/dL (ref 8–23)
CO2: 23 mmol/L (ref 22–32)
Calcium: 8.4 mg/dL — ABNORMAL LOW (ref 8.9–10.3)
Chloride: 95 mmol/L — ABNORMAL LOW (ref 98–111)
Creatinine, Ser: 0.95 mg/dL (ref 0.61–1.24)
GFR, Estimated: >60 mL/min (ref >60)
Glucose, Bld: 176 mg/dL — ABNORMAL HIGH (ref 70–99)
Potassium: 4.3 mmol/L (ref 3.5–5.1)
Sodium: 128 mmol/L — ABNORMAL LOW (ref 135–145)

## 2023-07-25 LAB — ECHOCARDIOGRAM COMPLETE
AR max vel: 2.12 cm2
AV Area VTI: 2.42 cm2
AV Area mean vel: 2.18 cm2
AV Mean grad: 6 mmHg
AV Peak grad: 12.3 mmHg
Ao pk vel: 1.76 m/s
Area-P 1/2: 2.99 cm2
S' Lateral: 3.2 cm
Weight: 4239.89 [oz_av]

## 2023-07-25 LAB — GLUCOSE, CAPILLARY
Glucose-Capillary: 239 mg/dL — ABNORMAL HIGH (ref 70–99)
Glucose-Capillary: 181 mg/dL — ABNORMAL HIGH (ref 70–99)
Glucose-Capillary: 119 mg/dL — ABNORMAL HIGH (ref 70–99)
Glucose-Capillary: 275 mg/dL — ABNORMAL HIGH (ref 70–99)

## 2023-07-25 LAB — TSH: TSH: 0.806 u[IU]/mL (ref 0.350–4.500)

## 2023-07-25 LAB — LIPID PANEL
Cholesterol: 214 mg/dL — ABNORMAL HIGH (ref 0–200)
HDL: 44 mg/dL (ref >40)
LDL Cholesterol: 154 mg/dL — ABNORMAL HIGH (ref 0–99)
Total CHOL/HDL Ratio: 4.9 ratio
Triglycerides: 79 mg/dL (ref <150)
VLDL: 16 mg/dL (ref 0–40)

## 2023-07-25 LAB — TROPONIN I (HIGH SENSITIVITY)
Troponin I (High Sensitivity): 7064 ng/L (ref <18)
Troponin I (High Sensitivity): 6267 ng/L
Troponin I (High Sensitivity): 6105 ng/L (ref <18)

## 2023-07-25 LAB — MAGNESIUM: Magnesium: 1.9 mg/dL (ref 1.7–2.4)

## 2023-07-25 MED ORDER — INSULIN GLARGINE-YFGN 100 UNIT/ML ~~LOC~~ SOLN
20.0000 [IU] | Freq: Every day | SUBCUTANEOUS | Status: AC
  Administered 2023-07-26: 20 [IU] via SUBCUTANEOUS
  Filled 2023-07-25: qty 0.2

## 2023-07-25 MED ORDER — FUROSEMIDE 10 MG/ML IJ SOLN
40.0000 mg | Freq: Once | INTRAMUSCULAR | Status: AC
  Administered 2023-07-25: 40 mg via INTRAVENOUS
  Filled 2023-07-25: qty 4

## 2023-07-25 MED ORDER — MAGNESIUM SULFATE IN D5W 1-5 GM/100ML-% IV SOLN
1.0000 g | Freq: Once | INTRAVENOUS | Status: AC
  Administered 2023-07-25: 1 g via INTRAVENOUS
  Filled 2023-07-25: qty 100

## 2023-07-25 MED ORDER — ASPIRIN 325 MG PO TABS: 325.0000 mg | ORAL_TABLET | Freq: Every day | ORAL | Status: DC

## 2023-07-25 MED ORDER — ASPIRIN 81 MG PO CHEW
324.0000 mg | CHEWABLE_TABLET | Freq: Once | ORAL | Status: AC
  Administered 2023-07-25: 324 mg via ORAL
  Filled 2023-07-25: qty 4

## 2023-07-25 MED ORDER — ASPIRIN 81 MG PO TBEC
81.0000 mg | DELAYED_RELEASE_TABLET | Freq: Every day | ORAL | Status: DC
  Administered 2023-07-26 – 2023-07-29 (×4): 81 mg via ORAL
  Filled 2023-07-25 (×4): qty 1

## 2023-07-25 MED ORDER — PANTOPRAZOLE SODIUM 40 MG PO TBEC
40.0000 mg | DELAYED_RELEASE_TABLET | Freq: Every day | ORAL | Status: DC
  Administered 2023-07-25 – 2023-07-29 (×5): 40 mg via ORAL
  Filled 2023-07-25 (×6): qty 1

## 2023-07-25 MED ORDER — HEPARIN (PORCINE) 25000 UT/250ML-% IV SOLN
1750.0000 [IU]/h | INTRAVENOUS | Status: DC
  Administered 2023-07-25: 1600 [IU]/h via INTRAVENOUS
  Administered 2023-07-26: 1900 [IU]/h via INTRAVENOUS
  Administered 2023-07-26: 1750 [IU]/h via INTRAVENOUS
  Filled 2023-07-25 (×3): qty 250

## 2023-07-25 MED ORDER — INSULIN ASPART 100 UNIT/ML IJ SOLN
0.0000 [IU] | Freq: Every day | INTRAMUSCULAR | Status: DC
  Administered 2023-07-25: 3 [IU] via SUBCUTANEOUS

## 2023-07-25 MED ORDER — FUROSEMIDE 10 MG/ML IJ SOLN
40.0000 mg | Freq: Two times a day (BID) | INTRAMUSCULAR | Status: DC
  Administered 2023-07-25 – 2023-07-26 (×3): 40 mg via INTRAVENOUS
  Filled 2023-07-25 (×3): qty 4

## 2023-07-25 NOTE — Assessment & Plan Note (Addendum)
 Last A1c on record June 2022 is 7.3, improved to 6.2 this admission.  Does have CGM. Home regimen: Lantus  20 units BID, NOT taking Jardiance due to side effects (angioedema?) - CBGs ACHS (does have CGM, but per pharmacy prefer to still do CBGs) - Moderate SSI and carb modified diet - Continue LAI 20 units daily

## 2023-07-25 NOTE — Consult Note (Addendum)
 Cardiology Consultation   Patient ID: [name]. MRN: [id number]; DOB: 1950/09/22  Admit date: 07/24/2023 Date of Consult: 07/25/2023  PCP:  [facility], [employer] [facility] Cardiologist:  [facility] Click here to update MD or APP on Care Team, Refresh:1}     Patient Profile:   [name]. is a 73 y.o. Cruz with a hx of paroxysmal atrial fibrillation on eliquis , chronic HFpEF, mild nonobstructive CAD s/p cath in 2010, alcohol abuse, cannabis use, history of tobacco use, possible mild mitral stenosis, hyponatremia, RBBB/LPFB, hyperlipidemia, lower extremity edema, COPD, T2DM, HTN, OSA on BIPAP, CKD2, remote history of intracranial hemorrhage, remote history of provoked DVT who is being seen 07/25/2023 for the evaluation of acute hypoxic respiratory failure at the request of Patrica Bookman.  History of Present Illness:   [name]  is a 73 y.o. Cruz who is actively followed by cardiology at the [place]. He had remote stress test here in 2010 that was abnormal prompting cath showing nonobstructive CAD (30% mLAD, 40% diag, 30% mCx). [facility] showed Showed EF 60-65%, mild LVH, cannot evaluate diastolic function due to underlying afib, mild TR, cannot exclude mild mitral stenosis, PASP 50-55mg  c/w pulmonary HTN. In 2023 developed pneumonia in [place] and developed atrial fibrillation. Wore a monitor after discharge that showed 44% burden of atrial fibrillation. He has been recommended for lifelong anticoagulation. His cardiologist had noted his very remote intracranial hemorrhage which was not felt to preclude use of anticoagulation. He has done well on this. Was last seen at the [place] on 06/29/23. It appears at this visit that an echo and a stress test were ordered for shortness of breath, but not yet completed or available. The meds the patient was on included Jardiance, Zetia, Lasix  20mg  daily, Entresto, carvedilol , and Eliquis . Interestingly he has an  allergy listed for both lisinopril  and losartan with h/o lip swelling/angioedema though reported to have been taking Entresto up until recently without issue. He does not recall having these issues.  The patient presented to the ER via EMS for acute hypoxic respiratory failure.  On interview patient reported that the shortness of breath started suddenly at about noon on 07/24/23 when he was at [facility] [place]. Reported going to [employer] for breakfast and getting a soup bowel and hashbrowns. Because of the shortness of breath he called 911. Per EMS patient initially has an SPO2 of 57% prior to starting BIPAP and getting albuterol  nebulized. SBP was 200s per triage note. Denies any prior history of heart attack or stroke or family history of heart attack or stroke. Patient denies chest pain, fever, chills, nausea, vomiting, diarrhea, palpitations. Differentials include pneumonia vs CHF exacerbation. Was started on IV lasix  40mg , and PRN duonebs, is currently on CIWA protocol. Labs showed BNP 243.3, Potassium 4.3, chloride decreased at 95, LDL Elevated at 154, A1c 6.2, TSH normal at 0.806, decreased sodium of 128, d dimer slightly elevated at 0.59, albumin 3.2. Chest x-ray showed Asymmetric edema vs PNA. EKG showed sinus tachycardia with a rate of 103, first degree AV block, RBBB, LPFB (known), LAE, with anterior TWI that are new from 2022. With above measures he has weaned to room air and HR is normal. He feels reasonably back to baseline prior to admission, though notes his SOB has been worse since he had the PNA originally in 2023.   [facility] was in the mid 280s, measured here 264.99 on admission, no weight  yet available today - patient reports he was in the 240s in 2023, unclear what recent trajectory was.    Past Medical History:  Diagnosis Date   Allergy    Anxiety    Arthritis    Cataract    bilateral lens implants   Clotting disorder (HCC)    DVT in 2006 x1; no problems since   Colon  stricture - diverticular (HCC) 09/07/2019   Diabetes mellitus    2   Diverticulitis    Diverticulosis    DVT (deep venous thrombosis) (HCC) 2007   left leg   Hepatitis 1973   treated at [facility]   HLD (hyperlipidemia)    Hypertension    Neuromuscular disorder (HCC)    neuropathy   Peripheral vascular disease (HCC) 2006   dvt 's   Pneumonia 2006   RBBB    Sleep apnea    cpap started 2 months ago set on 4   Subdural hematoma [facility])     Past Surgical History:  Procedure Laterality Date   BRAIN SURGERY  2010   subdural hematoma -bleed6 months after heart cath   CATARACT EXTRACTION, BILATERAL  12/16, 2017   cataract   CHOLECYSTECTOMY N/A 09/08/2020   Procedure: LAPAROSCOPIC CHOLECYSTECTOMY WITH INTRAOPERATIVE CHOLANGIOGRAM;  Surgeon: Dareen Ebbing, MD;  Location: [facility];  Service: General;  Laterality: N/A;   COLONOSCOPY  2014   CYSTOSCOPY WITH INSERTION OF UROLIFT     CYSTOSCOPY WITH INSERTION OF UROLIFT     DG THUMB LEFT HAND Left    "gamers "   ROTATOR CUFF REPAIR W/ DISTAL CLAVICLE EXCISION Left 05/20/2015   SHOULDER ARTHROSCOPY WITH SUBACROMIAL DECOMPRESSION, ROTATOR CUFF REPAIR AND BICEP TENDON REPAIR Left 05/20/2015   Procedure: LEFT SHOULDER ARTHROSCOPY WITH SUBACROMIAL DECOMPRESSION,DISTAL CLAVICAL RESECTION  ROTATOR CUFF REPAIR ;  Surgeon: Ellard Gunning, MD;  Location: [facility];  Service: Orthopedics;  Laterality: Left;   TONSILLECTOMY       Home Medications:  Prior to Admission medications   Medication Sig Start Date End Date Taking? Authorizing Provider  apixaban (ELIQUIS) 5 MG TABS tablet Take 5 mg by mouth 2 (two) times daily. 09/27/21  Yes [provider]  Azelastine HCl 137 MCG/SPRAY SOLN Place 1 spray into both nostrils 2 (two) times daily.  06/20/19  Yes [provider]  budesonide-formoterol (SYMBICORT) 160-4.5 MCG/ACT inhaler Inhale 2 puffs into the lungs 2 (two) times daily. 09/27/21  Yes [provider]  carvedilol  (COREG ) 25 MG tablet Take  25 mg by mouth 2 (two) times daily. 09/19/19  Yes [provider]  Cholecalciferol (VITAMIN D) 50 MCG (2000 UT) CAPS Take 1 capsule by mouth every evening.   Yes [provider]  Coenzyme Q10 (COQ10) 100 MG CAPS Take 100 mg by mouth daily.   Yes [provider]  fluticasone  (FLONASE ) 50 MCG/ACT nasal spray Place 2 sprays into both nostrils 2 (two) times daily. 09/27/21  Yes [provider]  furosemide  (LASIX ) 20 MG tablet Take 20 mg by mouth daily.   Yes [provider]  Glucosamine-Chondroit-Vit C-Mn (GLUCOSAMINE 1500 COMPLEX PO) Take 1 tablet by mouth every evening.   Yes [provider]  hydrOXYzine  (VISTARIL ) 25 MG capsule Take 25 mg by mouth 2 (two) times daily as needed (for sleep per patient).   Yes [provider]  insulin  aspart (NOVOLOG ) 100 UNIT/ML injection Inject 0-8 Units into the skin 3 (three) times daily as needed for high blood sugar.    Yes [provider]  insulin  glargine (LANTUS ) 100 UNIT/ML injection Inject 20 Units into the skin 2 (two) times daily.   Yes [provider]  MAGNESIUM  OXIDE PO Take 500 mg by mouth at bedtime.   Yes [provider]  Multiple Vitamin (MULTIVITAMIN WITH MINERALS) TABS Take 1 tablet by mouth every evening.    Yes [provider]  neomycin-polymyxin-hydrocortisone (CORTISPORIN) OTIC solution Place 4 drops into the right ear 4 (four) times daily as needed (itchy ears).   Yes [provider]  olopatadine  (PATANOL) 0.1 % ophthalmic solution Place 1 drop into both eyes 2 (two) times daily.   Yes [provider]  polyethylene glycol powder (GLYCOLAX /MIRALAX ) 17 GM/SCOOP powder Take 34 g by mouth at bedtime. 10/06/19  Yes [provider]  potassium gluconate 595 MG TABS Take 595 mg by mouth every evening.    Yes [provider]  Probiotic Product (PROBIOTIC DAILY PO) Take 2 capsules by mouth every evening.   Yes [provider]  sacubitril-valsartan (ENTRESTO) 97-103 MG Take 1 tablet by mouth 2 (two) times daily. 06/29/23  Yes [provider]  terazosin  (HYTRIN ) 2 MG capsule Take 2 mg by mouth at bedtime. 12/14/22  Yes [provider]  traZODone  (DESYREL ) 100 MG tablet Take 100 mg by mouth at bedtime. 06/18/23  Yes [provider]  TURMERIC PO Take 1 tablet by mouth every evening. With Ginger   Yes [provider]  Vitamin A 2400 MCG (8000 UT) CAPS Take 8,000 Units by mouth every evening.   Yes [provider]  vitamin B-12 (CYANOCOBALAMIN) 500 MCG tablet Take 500 mcg by mouth every evening.   Yes [provider]  cromolyn  (OPTICROM ) 4 % ophthalmic solution Place 1 drop into both eyes 4 (four) times daily as needed (itchy eyes).    [provider]  felodipine  (PLENDIL ) 10 MG 24 hr tablet Take 10 mg by mouth daily. Patient not taking: Reported on 07/24/2023 01/13/20   [provider]  Omega-3 Fatty Acids (FISH OIL PO) Take 1,200 mg by mouth every evening. Patient not taking: Reported on 07/24/2023    [provider]  OVER THE COUNTER MEDICATION Take 1 capsule by mouth at bedtime. Oil of Oregano    [provider]  tadalafil (CIALIS) 20 MG tablet Take 20 mg by mouth daily as needed for erectile dysfunction.    [provider]    Inpatient Medications: Scheduled Meds:  apixaban   5 mg Oral BID   carvedilol   25 mg Oral BID   fluticasone   2 spray Each Nare Daily   folic acid   1 mg Oral Daily   furosemide   40 mg Intravenous Once   insulin  aspart  0-15 Units Subcutaneous TID WC   insulin  glargine-yfgn  20 Units Subcutaneous Daily   multivitamin with minerals  1 tablet Oral Daily   olopatadine   1 drop Both Eyes BID   sodium chloride  flush  3 mL Intravenous Q12H   terazosin   2 mg Oral QHS   thiamine   100 mg Oral Daily   Or   thiamine   100 mg Intravenous Daily   traZODone   100 mg Oral QHS   umeclidinium bromide    1 puff Inhalation Daily   Continuous Infusions:  PRN Meds: acetaminophen  **OR** acetaminophen , cromolyn , hydrOXYzine , ipratropium-albuterol , polyethylene glycol  Allergies:    Allergies  Allergen Reactions   Ace Inhibitors Other (See Comments)    Angioedema per pt report to admiting MD On 07/31/2019 - lisinopril    Gabapentin Swelling  and Other (See Comments)     Influenza-like illness   Peanut Oil Shortness Of Breath    Swelling throat, sob  Other reaction(s): anaphylaxis   Tamiflu [Oseltamivir Phosphate] Shortness Of Breath   Losartan Swelling    Other reaction(s): Lip swelling   Lyrica [Pregabalin] Swelling   Oxcarbazepine Swelling and Other (See Comments)    Lip swelling   Statins Other (See Comments)    Myalgia -- atorvastatin, simvastatin   Avelox [Moxifloxacin Hcl In Nacl] Rash   Ciprofloxacin  Anxiety   Citalopram Anxiety    Other reaction(s): Drowsy, Anxiety   Victoza [Liraglutide] Other (See Comments)    [facility]    Social History:   Social History   Socioeconomic History   Marital status: Married    Spouse name: Not on file   Number of children: 2   Years of education: Not on file   Highest education level: Not on file  Occupational History   Occupation: Radio Tech  Tobacco Use   Smoking status: Former    Current packs/day: 0.00    Average packs/day: 1 pack/day for 15.0 years (15.0 ttl pk-yrs)    Types: Cigarettes    Start date: 1964    Quit date: 1979    Years since quitting: 46.4   Smokeless tobacco: Never  Vaping Use   Vaping status: Former   Devices: THC  Substance and Sexual Activity   Alcohol use: Yes    Alcohol/week: 10.0 standard drinks of alcohol    Types: 10 Cans of beer per week    Comment: 3 beers per day   Drug use: Yes    Types: Marijuana    Comment: THC   Sexual activity: Not Currently  Other Topics Concern   Not on file  Social History Narrative   Patient is a veteran and gets his care through the [place] system   He  is married with 2 daughters   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   3 beers a day on average 2 caffeinated beverages daily former smoker no current tobacco or drug use   Social Drivers of Corporate investment banker Strain: Not on file  Food Insecurity: No Food Insecurity (07/24/2023)   Hunger Vital Sign    Worried About Running Out of Food in the Last Year: Never true    Ran Out of Food in the Last Year: Never true  Transportation Needs: No Transportation Needs (07/24/2023)   PRAPARE - Administrator, Civil Service (Medical): No    Lack of Transportation (Non-Medical): No  Physical Activity: Not on file  Stress: Not on file  Social Connections: Moderately Integrated (07/24/2023)   Social Connection and Isolation Panel [NHANES]    Frequency of Communication with Friends and Family: Never    Frequency of Social Gatherings with Friends and Family: Never    Attends Religious Services: More than 4 times per year    Active Member of [employer] or Organizations: Patient declined    Attends Engineer, structural: More than 4 times per year    Marital Status: Married  Catering manager Violence: Not At Risk (07/24/2023)   Humiliation, Afraid, Rape, and Kick questionnaire    Fear of Current or Ex-Partner: No    Emotionally Abused: No    Physically Abused: No    Sexually Abused: No    Family History:    Family History  Problem Relation Age of Onset   Hypertension Mother  Alzheimer's disease Mother    Stroke Father    Heart attack Maternal Grandfather 61   Cancer Paternal Grandfather        type unknown   Diabetes Brother    Colon polyps Neg Hx    Colon cancer Neg Hx    Liver cancer Neg Hx    Stomach cancer Neg Hx    Rectal cancer Neg Hx      ROS:  Please see the history of present illness.   All other ROS reviewed and negative.     Physical Exam/Data:   Vitals:   07/24/23 2046 07/25/23 0103 07/25/23 0456 07/25/23 0903  BP: (!)  150/77 138/63 129/68 (!) 128/58  Pulse: 96 81 75 74  Resp: 18  18   Temp: 98.4 F (36.9 C) 99.5 F (37.5 C) 98 F (36.7 C) 97.6 F (36.4 C)  TempSrc: Oral Oral Oral   SpO2: 97% 95% 96% 98%  Weight:   120.2 kg    No intake or output data in the 24 hours ending 07/25/23 1226    07/25/2023    4:56 AM 09/08/2020   12:24 PM 07/30/2020    2:05 AM  Last 3 Weights  Weight (lbs) 264 lb 15.9 oz 250 lb 263 lb 9.6 oz  Weight (kg) 120.2 kg 113.399 kg 119.568 kg     Body mass index is 34.02 kg/m.  General:  Well nourished, well developed, in no acute distress HEENT: normal Neck: no JVD Vascular: Distal pulses 2+ bilaterally, positive carotid bruit bilaterally Cardiac:  normal S1, S2; RRR; systolic ejection murmur on left sternal boarder Lungs:  wheezing and crackles heard on lower lobes bilaterally Abd: distended and hard to palpation, mild abdomen tenderness  Ext: 1+ edema Musculoskeletal:  No deformities. Skin: warm and dry. Neuro:  no focal abnormalities noted. Psych:  Normal affect.  EKG:  The EKG was personally reviewed and demonstrates:  Sinus tachycardia with a rate of 103, first degree AV block, RBBB, LPFB (known), LAE, with anterior TWI that are new from 2022 Telemetry:  Telemetry was personally reviewed and demonstrates:  normal sinus rhythm with rates 60-80's  Relevant CV Studies: Echo pending  2023 CareEverywhere  Findings:   1. The LVEF is 60-65%. There is mild concentric left ventricular   hypertrophy present.   2. Diastolic function cannot be assessed due to underlying atrial   fibrillation but the left-sided filling pressure is elevated.   3. The RV is poorly visualized but grossly appears normal in size   and function.   4. The left and right atrium normal in size.   5. There is mild tricuspid valve regurgitation.   6. There is severe mitral annular calcification. Cannot exclude mild   mitral stenosis.   7. The IVC is dilated with blunted respiratory variation.    8. The estimated PASP is 50-55 mm Hg.   Prior study from 09/08/2019 done at an outside hospital reported EF   of 60-65%.   Laboratory Data:  High Sensitivity Troponin:  No results for input(s): "TROPONINIHS" in the last 720 hours.   Chemistry Recent Labs  Lab 07/24/23 1224 07/25/23 0613  NA 131* 128*  K 4.4 4.3  CL 96* 95*  CO2 24 23  GLUCOSE 244* 176*  BUN 16 21  CREATININE 0.96 0.95  CALCIUM  8.1* 8.4*  MG 1.8 1.9  GFRNONAA >60 >60  ANIONGAP 11 10    Recent Labs  Lab 07/24/23 1224  PROT 6.2*  ALBUMIN 3.2*  AST  26  ALT 19  ALKPHOS 56  BILITOT 1.0   Lipids  Recent Labs  Lab 07/25/23 0613  CHOL 214*  TRIG 79  HDL 44  LDLCALC 154*  CHOLHDL 4.9    Hematology Recent Labs  Lab 07/24/23 1224  WBC 10.3  RBC 5.67  HGB 17.0  HCT 50.4  MCV 88.9  MCH 30.0  MCHC 33.7  RDW 13.5  PLT 308   Thyroid  Recent Labs  Lab 07/25/23 0613  TSH 0.806    BNP Recent Labs  Lab 07/24/23 1224  BNP 243.3*    DDimer  Recent Labs  Lab 07/24/23 1755  DDIMER 0.59*     Radiology/Studies:  DG Chest Portable 1 View Result Date: 07/24/2023 CLINICAL DATA:  Shortness of breath. EXAM: PORTABLE CHEST 1 VIEW COMPARISON:  Chest radiograph dated 09/07/2019. FINDINGS: Diffuse interstitial densities as well as diffuse airspace density throughout the left lung may represent asymmetric edema, pneumonia, or combination. Clinical correlation recommended. No pleural effusion pneumothorax. The cardiac silhouette is within normal limits. No acute osseous pathology IMPRESSION: Asymmetric edema, pneumonia, or combination. Electronically Signed   By: Angus Bark M.D.   On: 07/24/2023 14:32     Assessment and Plan:   Alan Bell. is a 73 y.o. Cruz with a hx of paroxysmal atrial fibrillation on eliquis, HFpEF, mild nonobstructive CAD s/p cath in 2010, alcohol use, cannabis use, history of tobacco use, mild mitral stenosis, hyperlipidemia, lower extremity edema, COPD, T2DM,  HTN, BIPAP, CKD2, remote history of intracranial hemorrhage, remote history of provoked DVT who is being seen 07/25/2023 for the evaluation of acute hypoxic respiratory failure at the request of Patrica Bookman.  Acute hypoxic respiratory failure Acute on chronic HFpEF vs HFmrEF H/o pulmonary HTN Mild mitral stenosis HTN Hyponatremia The shortness of breath started suddenly at about noon on 07/24/23 when he was at [facility] [place]. Because of this he called EMS. Per EMS patient initially has an SPO2 of 57% prior to starting BIPAP and getting albuterol nebulizers. PTA on lasix  20mg  for HFpEF and lower extremity edema. D-dimer was mildly elevated but reported that he has not missed any doses of his eliquis. Labs showed BNP 243.3, Chest x-ray showed Asymmetric edema vs PNA. Was started on IV lasix  40mg , received 2 doses thus far with improvement back to room air. Daily weights, I/O's not available. - order baseline troponin given sudden onset of symptoms for completeness - appears euvolemic on exam except for breath sounds - continue IV Lasix  at 40mg  BID dosing - Per Dr. Chancy Comber, possible wall motion abnormalities in LAD distribution concerning for obstructive CAD vs Takotsubo cardiomyopathy, possible mild LV dysfunction - Plan for [facility] on Friday 07/27/23. Orders written.  GDMT - Patient affirms he is taking Entresto despite prior possible issues with ACEI/ARB. Per Dr. Chancy Comber, follow BP trends with diuresis; she feels it would be reasonable to resume since he is on this chronically - can review in AM. - Continue Coreg  25mg  BID - Hold home Jardiance 25mg  daily. Plan to start prior to discharge when we are certain will not need procedures.   Paroxysmal Atrial fibrillation CHA2DS2-VASc Score = 5 [CHF History: 1, HTN History: 1, Diabetes History: 1, Stroke History: 0, Vascular Disease History: 1, Age Score: 1, Gender Score: 0].  Therefore, the patient's annual risk of stroke is 7.2 %.    In 2023 developed  pneumonia in [place] and developed atrial fibrillation. Wore a monitor after discharge that showed 44% burden of atrial fibrillation. On  long term anticoagulation with Eliquis 5 mg BID per his primary cardiologist.  In sinus rhythm on telemetry -- stop Eliquis 5 mg BID (correct dose) -- start IV heparin  Mild nonobstructive CAD s/p cath in 2010 Hyperlipidemia  - LDL elevated at 154, Goal <696. - On Zetia that appears like it was started on 06/29/23 by [place]. Appears to have had adverse reaction to statin in the past. Now reported to have intolerance to Zetia. - Would explore PCSK9i in follow-up at the [facility]. - Plan as above  Alcohol use - On CIWA - recommend reducing alcohol intake. Patient is willing to do this. - abdomen distended. May want to consider evaluation for cirrhosis. - Management per primary  Diabetes mellitus - Will reach out to IM team to notify them of plan for cardiac cath on Friday for their assistance managing his insulin  regimen since he will be NPO after midnight that day  Cannabis use History of tobacco use COPD - May contribute to shortness of breath - Management per primary  Other conditions managed per primary  Risk Assessment/Risk Scores:        New York  Heart Association (NYHA) Functional Class NYHA Class IV on arrival  CHA2DS2-VASc Score = 5   This indicates a 7.2% annual risk of stroke. The patient's score is based upon: CHF History: 1 HTN History: 1 Diabetes History: 1 Stroke History: 0 Vascular Disease History: 1 Age Score: 1 Gender Score: 0       For questions or updates, please contact [facility] Please consult [email or web address] for contact info under    Signed, Melita Springer, PA-C  07/25/2023 12:26 PM

## 2023-07-25 NOTE — Progress Notes (Addendum)
 PHARMACY - ANTICOAGULATION CONSULT NOTE  Pharmacy Consult for heparin  Indication: atrial fibrillation  Allergies  Allergen Reactions   Ace Inhibitors Other (See Comments)    Angioedema per pt report to admiting MD On 07/31/2019 - lisinopril    Gabapentin Swelling and Other (See Comments)     Influenza-like illness   Peanut Oil Shortness Of Breath    Swelling throat, sob  Other reaction(s): anaphylaxis   Tamiflu [Oseltamivir Phosphate] Shortness Of Breath   Losartan Swelling    Other reaction(s): Lip swelling   Lyrica [Pregabalin] Swelling   Oxcarbazepine Swelling and Other (See Comments)    Lip swelling   Statins Other (See Comments)    Myalgia -- atorvastatin, simvastatin   Avelox [Moxifloxacin Hcl In Nacl] Rash   Ciprofloxacin  Anxiety   Citalopram Anxiety    Other reaction(s): Drowsy, Anxiety   Victoza [Liraglutide] Other (See Comments)    [facility]    Patient Measurements: Weight: 120.2 kg (264 lb 15.9 oz)  Vital Signs: Temp: 97.6 F (36.4 C) (05/28 0903) Temp Source: Oral (05/28 0456) BP: 128/58 (05/28 0903) Pulse Rate: 74 (05/28 0903)  Labs: Recent Labs    07/24/23 1224 07/25/23 0613  HGB 17.0  --   HCT 50.4  --   PLT 308  --   CREATININE 0.96 0.95    CrCl cannot be calculated (Unknown ideal weight.).   Medical History: Past Medical History:  Diagnosis Date   Allergy    Anxiety    Arthritis    Cataract    bilateral lens implants   Clotting disorder (HCC)    DVT in 2006 x1; no problems since   Colon stricture - diverticular (HCC) 09/07/2019   Diabetes mellitus    2   Diverticulitis    Diverticulosis    DVT (deep venous thrombosis) (HCC) 2007   left leg   Hepatitis 1973   treated at [facility]   HLD (hyperlipidemia)    Hypertension    Neuromuscular disorder (HCC)    neuropathy   Peripheral vascular disease (HCC) 2006   dvt 's   Pneumonia 2006   RBBB    Sleep apnea    Subdural hematoma (HCC)     Assessment: 73 yo M on apixaban PTA  for afib with remote hx if ICH. Cardiology recommends lifelong anticoagulation. Holding apixaban in anticipation of cath 5/30, last dose 5/27 at 5am. Pharmacy consulted for heparin.    Goal of Therapy:  Heparin level 0.3-0.7 units/ml aPTT 66-102 seconds Monitor platelets by anticoagulation protocol: Yes   Plan:  Heparin 1600 units/hr, no bolus F/u aPTT until correlates with heparin level  F/u 8hr aPTT Monitor daily aPTT, heparin level, CBC, signs/symptoms of bleeding  F/u cath timing 5/30 and heparin hold time  Dorene Gang, PharmD, BCPS, [facility] Clinical Pharmacist  Please check AMION for all [facility] Pharmacy phone numbers After 10:00 PM, call Main Pharmacy [phone]

## 2023-07-25 NOTE — Assessment & Plan Note (Addendum)
 Reportedly drinks 3 beers nightly, last drink was last night.  No history of WD seizures or alcohol WD.  CIWAs remain 0. Does have mild-moderate hyponatremia, suspect 2/2 beer intake. - CIWAs Q6h - Thiamine , folate, and MVI daily - Consult to [facility]

## 2023-07-25 NOTE — Progress Notes (Addendum)
 Initial troponin elevated in 7000's. Discussed with Dr Adrienne Horning and will continue to trend troponin. Start ASA 324mg  chewable now, then start ASA EC 81mg  daily in AM, as well as Protonix 40mg  daily starting today.

## 2023-07-25 NOTE — Assessment & Plan Note (Addendum)
 Afib: Eliquis 5 mg BID, no Afib here, wonder if can discontinue and recommend Cardiology f/u Insomnia: Continue home trazodone 100 mg QHS and hydroxyzine  25 mg BID PRN HTN: As per AHRF above HLD: Not on ezetimibe or statin due to intolerance; LDL 154 and may need Repatha outpatient Allergies: Fluticasone  daily, olopatadine  eye drops BID OSA: BiPAP per RT nightly CKD2: Recently diagnosed, Cr stable on admission

## 2023-07-25 NOTE — Assessment & Plan Note (Signed)
 Do not favor exacerbation as no history of cough and no wheeze on exam. Home regimen: Albuterol PRN (using a few times monthly) and Spiriva Respimat daily - DuoNebs Q6h PRN - Start Incruse Ellipta (Spiriva Respimat formulary equivalent) daily - O2 sat goa 88-92%, BiPAP nightly for OSA - Recommended marijuana cessation or at least discontinuing smoking inhalation

## 2023-07-25 NOTE — Assessment & Plan Note (Addendum)
 Likely CHF exacerbation given BNP and pulmonary edema but no formal diagnosis though follows with [facility].  D-dimer not concerning for PE.  Likely limited respiratory reserve at baseline with COPD and OSA.  Weaned to 1L O2 overnight. No I&Os documented in ED, unable to quantify output with Lasix .  Baseline weight 120.2 kg. - Redose Lasix  40 mg IV (home regimen 20 mg PO daily) - Strict I&Os, daily weights, and heart healthy diet - Continuous cardiac monitoring and pulse oximetry - f/u TTE - Continue carvedilol  25 mg BID - Holding home Entresto (angioedema), Jardiance (not taking, also angioedema?) and defer to Cardiology - Cardiology consulted, follow up recommendations - AM BMP and Mag, Mag; goal K>4, Mag>2

## 2023-07-25 NOTE — Progress Notes (Signed)
 Daily Progress Note Intern Pager: [phone]  Patient name: [name]. Medical record number: [id number] Date of birth: August 13, 1950 Age: 73 y.o. Gender: male  Primary Care Provider: [facility], [facility] Consultants: Cardiology Code Status: FULL  Pt Overview and Major Events to Date:  5/27 - Admitted, started diuresis   Assessment and Plan: Nakul Avino. is a 73 y.o. male with a pertinent PMH of T2DM, A-fib on Eliquis , HTN, HLD, RBBB, and OSA on CPAP presenting with acute hypoxemic respiratory failure and was admitted for suspected CHF exacerbation.  Currently undergoing cardiovascular workup for probable CHF and Cardiology consulted today.  Volume status improving and on just 1L O2 now. Assessment & Plan Acute hypoxemic respiratory failure (HCC) Likely CHF exacerbation given BNP and pulmonary edema but no formal diagnosis though follows [facility].  D-dimer not concerning for PE.  Likely limited respiratory reserve at baseline with COPD and OSA.  Weaned to 1L O2 overnight. No I&Os documented in ED, unable to quantify output with Lasix .  Baseline weight 120.2 kg. - Redose Lasix  40 mg IV (home regimen 20 mg PO daily) - Strict I&Os, daily weights, and heart healthy diet - Continuous cardiac monitoring and pulse oximetry - f/u TTE - Continue carvedilol  25 mg BID - Holding home Entresto (angioedema), Jardiance (not taking, also angioedema?) and defer to Cardiology - Cardiology consulted, follow up recommendations - AM BMP and Mag, Mag; goal K>4, Mag>2 COPD (chronic obstructive pulmonary disease) (HCC) Do not favor exacerbation as no history of cough and no wheeze on exam. Home regimen: Albuterol  PRN (using a few times monthly) and Spiriva Respimat daily - DuoNebs Q6h PRN - Start Incruse Ellipta  (Spiriva Respimat formulary equivalent) daily - O2 sat goa 88-92%, BiPAP nightly for OSA - Recommended marijuana cessation or at least discontinuing smoking  inhalation Alcohol use  Hyponatremia Reportedly drinks 3 beers nightly, last drink was last night.  No history of WD seizures or alcohol WD.  CIWAs remain 0. Does have mild-moderate hyponatremia, suspect 2/2 beer intake. - CIWAs Q6h - Thiamine , folate, and MVI daily - Consult to TOC T2DM (type 2 diabetes mellitus) (HCC) Last A1c on record June 2022 is 7.3, improved to 6.2 this admission.  Does have CGM. Home regimen: Lantus  20 units BID, NOT taking Jardiance due to side effects (angioedema?) - CBGs ACHS (does have CGM, but per pharmacy prefer to still do CBGs) - Moderate SSI and carb modified diet - Continue LAI 20 units daily Chronic health problem Afib: Eliquis  5 mg BID, no Afib here, wonder if can discontinue and recommend Cardiology f/u Insomnia: Continue home trazodone  100 mg QHS and hydroxyzine  25 mg BID PRN HTN: As per AHRF above HLD: Not on ezetimibe or statin due to intolerance; LDL 154 and may need Repatha outpatient Allergies: Fluticasone  daily, olopatadine  eye drops BID OSA: BiPAP per RT nightly CKD2: Recently diagnosed, Cr stable on admission  FEN/GI: Heart healthy/carb modified diet, PIV saline locked PPx: Eliquis  5 mg BID Dispo: Pending PT recommendations  pending clinical improvement . Barriers include respiratory status, diuresis, Cardiology workup.  Subjective:  This AM, patient asks multiple questions about his home meds and ways to optimize lifestyle.  Held extensive discussion about cardiac risk factors, marijuana cessation, and alcohol cessation.  Patient denies dyspnea, inquires when he can leave the hospital.  Objective: Temp:  [98 F (36.7 C)-99.5 F (37.5 C)] 98 F (36.7 C) (05/28 0456) Pulse Rate:  [75-101] 75 (05/28 0456) Resp:  [18-29] 18 (05/28  0456) BP: (123-160)/(63-88) 129/68 (05/28 0456) SpO2:  [95 %-100 %] 96 % (05/28 0456) FiO2 (%):  [30 %] 30 % (05/27 1156) Weight:  [120.2 kg] 120.2 kg (05/28 0456)  Physical Exam: General:  Age-appropriate, resting comfortably in bed, NAD, alert and at baseline. HEENT: MMM. Unable to assess JVD. Cardiovascular: Regular rate and rhythm. Normal S1/S2. Possible soft murmur, difficult to auscultate. No rubs or gallops appreciated. 2+ radial pulses. Pulmonary: Bibasilar crackles.  No wheezes or rhonchi. Normal WOB on 1L [place]. Abdominal: Soft pannus, nondistended. No tenderness to deep or light palpation. No rebound or guarding. Skin: Warm and dry. Extremities: Trace peripheral edema at ankles bilaterally. Chronic venous stasis skin changes overlying BLE. Capillary refill <2 seconds.  Laboratory: Most recent CBC Lab Results  Component Value Date   WBC 10.3 07/24/2023   HGB 17.0 07/24/2023   HCT 50.4 07/24/2023   MCV 88.9 07/24/2023   PLT 308 07/24/2023   Most recent BMP    Latest Ref Rng & Units 07/24/2023   12:24 PM  BMP  Glucose 70 - 99 mg/dL 811   BUN 8 - 23 mg/dL 16   Creatinine 9.14 - 1.24 mg/dL 7.82   Sodium 956 - 213 mmol/L 131   Potassium 3.5 - 5.1 mmol/L 4.4   Chloride 98 - 111 mmol/L 96   CO2 22 - 32 mmol/L 24   Calcium  8.9 - 10.3 mg/dL 8.1     Other pertinent labs: - TSH WNL - LDL 154  New Imaging/Diagnostic Tests: - TTE ordered and scheduled for 9 AM  Kweli Grassel, MD 07/25/2023, 7:30 AM  PGY-1, [facility] FPTS Intern pager: [phone], text pages welcome Secure chat group [facility] Teaching Service

## 2023-07-26 ENCOUNTER — Encounter (HOSPITAL_COMMUNITY): Payer: Self-pay | Admitting: Family Medicine

## 2023-07-26 DIAGNOSIS — I214 Non-ST elevation (NSTEMI) myocardial infarction: Secondary | ICD-10-CM | POA: Diagnosis not present

## 2023-07-26 LAB — HEPARIN LEVEL (UNFRACTIONATED): Heparin Unfractionated: >1.1 [IU]/mL — ABNORMAL HIGH (ref 0.30–0.70)

## 2023-07-26 LAB — BASIC METABOLIC PANEL WITH GFR
Anion gap: 7 (ref 5–15)
BUN: 25 mg/dL — ABNORMAL HIGH (ref 8–23)
CO2: 31 mmol/L (ref 22–32)
Calcium: 8.9 mg/dL (ref 8.9–10.3)
Chloride: 92 mmol/L — ABNORMAL LOW (ref 98–111)
Creatinine, Ser: 1.01 mg/dL (ref 0.61–1.24)
GFR, Estimated: >60 mL/min (ref >60)
Glucose, Bld: 141 mg/dL — ABNORMAL HIGH (ref 70–99)
Potassium: 4.1 mmol/L (ref 3.5–5.1)
Sodium: 130 mmol/L — ABNORMAL LOW (ref 135–145)

## 2023-07-26 LAB — CBC
HCT: 48 % (ref 39.0–52.0)
Hemoglobin: 16.3 g/dL (ref 13.0–17.0)
MCH: 30 pg (ref 26.0–34.0)
MCHC: 34 g/dL (ref 30.0–36.0)
MCV: 88.4 fL (ref 80.0–100.0)
Platelets: 260 10*3/uL (ref 150–400)
RBC: 5.43 MIL/uL (ref 4.22–5.81)
RDW: 13.7 % (ref 11.5–15.5)
WBC: 10.9 10*3/uL — ABNORMAL HIGH (ref 4.0–10.5)
nRBC: 0 % (ref 0.0–0.2)

## 2023-07-26 LAB — APTT
aPTT: 104 s — ABNORMAL HIGH (ref 24–36)
aPTT: 53 s — ABNORMAL HIGH (ref 24–36)
aPTT: 70 s — ABNORMAL HIGH (ref 24–36)

## 2023-07-26 LAB — MAGNESIUM: Magnesium: 1.9 mg/dL (ref 1.7–2.4)

## 2023-07-26 LAB — GLUCOSE, CAPILLARY
Glucose-Capillary: 206 mg/dL — ABNORMAL HIGH (ref 70–99)
Glucose-Capillary: 112 mg/dL — ABNORMAL HIGH (ref 70–99)
Glucose-Capillary: 152 mg/dL — ABNORMAL HIGH (ref 70–99)
Glucose-Capillary: 146 mg/dL — ABNORMAL HIGH (ref 70–99)

## 2023-07-26 MED ORDER — MAGNESIUM SULFATE 2 GM/50ML IV SOLN
2.0000 g | Freq: Once | INTRAVENOUS | Status: AC
  Administered 2023-07-26: 2 g via INTRAVENOUS
  Filled 2023-07-26: qty 50

## 2023-07-26 MED ORDER — SODIUM CHLORIDE 0.9 % IV SOLN: INTRAVENOUS | Status: DC

## 2023-07-26 MED ORDER — INSULIN ASPART 100 UNIT/ML IJ SOLN
0.0000 [IU] | Freq: Three times a day (TID) | INTRAMUSCULAR | Status: DC
  Administered 2023-07-26: 4 [IU] via SUBCUTANEOUS
  Administered 2023-07-27: 7 [IU] via SUBCUTANEOUS
  Administered 2023-07-27 – 2023-07-28 (×2): 3 [IU] via SUBCUTANEOUS
  Administered 2023-07-28: 7 [IU] via SUBCUTANEOUS
  Administered 2023-07-29 (×2): 4 [IU] via SUBCUTANEOUS
  Administered 2023-07-29: 3 [IU] via SUBCUTANEOUS
  Administered 2023-07-30: 4 [IU] via SUBCUTANEOUS

## 2023-07-26 MED ORDER — ASPIRIN 81 MG PO CHEW: 81.0000 mg | CHEWABLE_TABLET | ORAL | Status: DC

## 2023-07-26 NOTE — Progress Notes (Signed)
 PHARMACY - ANTICOAGULATION CONSULT NOTE  Pharmacy Consult for heparin  Indication: atrial fibrillation  Allergies  Allergen Reactions   Ace Inhibitors Other (See Comments)    Angioedema per pt report to admiting MD On 07/31/2019 - lisinopril    Gabapentin Swelling and Other (See Comments)     Influenza-like illness   Peanut Oil Shortness Of Breath    Swelling throat, sob  Other reaction(s): anaphylaxis   Tamiflu [Oseltamivir Phosphate] Shortness Of Breath   Losartan Swelling    Other reaction(s): Lip swelling   Lyrica [Pregabalin] Swelling   Oxcarbazepine Swelling and Other (See Comments)    Lip swelling   Statins Other (See Comments)    Myalgia -- atorvastatin, simvastatin   Avelox [Moxifloxacin Hcl In Nacl] Rash   Ciprofloxacin  Anxiety   Citalopram Anxiety    Other reaction(s): Drowsy, Anxiety   Victoza [Liraglutide] Other (See Comments)    [facility]    Patient Measurements: Height: 6\' 2"  (188 cm) Weight: 120.2 kg (264 lb 15.9 oz) IBW/kg (Calculated) : 82.2  Vital Signs: Temp: 97.7 F (36.5 C) (05/28 2035) Temp Source: Oral (05/28 2035) BP: 154/63 (05/28 2035) Pulse Rate: 71 (05/28 2035)  Labs: Recent Labs    07/24/23 1224 07/25/23 0613 07/25/23 1524 07/25/23 1800 07/25/23 2006 07/25/23 2358  HGB 17.0  --   --   --   --   --   HCT 50.4  --   --   --   --   --   PLT 308  --   --   --   --   --   APTT  --   --   --   --   --  53*  CREATININE 0.96 0.95  --   --   --   --   TROPONINIHS  --   --  7,064* 6,267* 6,105*  --     Estimated Creatinine Clearance: 96.8 mL/min (by C-G formula based on SCr of 0.95 mg/dL).   Medical History: Past Medical History:  Diagnosis Date   Allergy    Anxiety    Arthritis    Cataract    bilateral lens implants   Clotting disorder (HCC)    DVT in 2006 x1; no problems since   Colon stricture - diverticular (HCC) 09/07/2019   Diabetes mellitus    2   Diverticulitis    Diverticulosis    DVT (deep venous  thrombosis) (HCC) 2007   left leg   Hepatitis 1973   treated at [facility]   HLD (hyperlipidemia)    Hypertension    Neuromuscular disorder (HCC)    neuropathy   Peripheral vascular disease (HCC) 2006   dvt 's   Pneumonia 2006   RBBB    Sleep apnea    Subdural hematoma (HCC)     Assessment: 73 yo M on apixaban PTA for afib with remote hx if ICH. Cardiology recommends lifelong anticoagulation. Holding apixaban in anticipation of cath 5/30, last dose 5/27 at 5am. Pharmacy consulted for heparin.    aPTT subtherapeutic (53) on heparin 1600 units/hr. No issues with the infusion or bleeding reported.  Goal of Therapy:  Heparin level 0.3-0.7 units/ml aPTT 66-102 seconds Monitor platelets by anticoagulation protocol: Yes   Plan:  Increase Heparin infusion to 1900 units/hr, no bolus F/u aPTT until correlates with heparin level  F/u 8hr aPTT Monitor daily aPTT, heparin level, CBC, signs/symptoms of bleeding  F/u cath timing 5/30 and heparin hold time  Armanda Bern, PharmD, BCPS Clinical  Pharmacist  Please check AMION for all [facility] Pharmacy phone numbers After 10:00 PM, call Main Pharmacy [phone]

## 2023-07-26 NOTE — Assessment & Plan Note (Addendum)
 TTE with regional LV WMAb.  Troponins elevated and slightly downtrending.  Suspect cardiomyopathy and will follow Cardiology lead.  AHRF on admission could have been flash pulmonary edema 2/2 cardiomyopathy or ACS, now stable on room air. 24 hour UOP of 2 L, net negative 2 L this admission. Weight down to 119.3 kg from 120.2 kg. K stable and Mag replenished.  Creatinine stable. No I&Os documented in ED, unable to quantify output with Lasix .  Baseline weight 120.2 kg. - Strict I&Os, daily weights, and heart healthy diet - Continuous cardiac monitoring and pulse oximetry - AM BMP and Mag, Mag; goal K>4, Mag>2 - Cardiology consulted, appreciate recommendations:  -Heparin gtt for heparin  -Loaded with ASA 324 mg, continue ASA 81 mg daily  -Pantoprazole 40 mg daily  -Continue carvedilol  25 mg BID  -Likely resume Entresto, decision pending  -Hold Jardiance until DC  -Lasix  40 mg IV BID  -L/R [facility] 5/30

## 2023-07-26 NOTE — H&P (View-Only) (Signed)
 Patient Name: [name]. Date of Encounter: 07/26/2023 [facility] Cardiologist: None   Interval Summary  .    No CP. No significant SOB. He notes that a medication he received yesterday evening made his lips burn, recommend review with pharmacist.    Vital Signs .    Vitals:   07/25/23 2015 07/25/23 2035 07/26/23 0446 07/26/23 0500  BP: (!) 159/89 (!) 154/63 (!) 150/64   Pulse: 93 71 71   Resp: 18 18 18    Temp: (!) 97.5 F (36.4 C) 97.7 F (36.5 C) (!) 97.5 F (36.4 C)   TempSrc:  Oral    SpO2: 97% 97% 97%   Weight:    119.3 kg  Height:        Intake/Output Summary (Last 24 hours) at 07/26/2023 0810 Last data filed at 07/25/2023 2100 Gross per 24 hour  Intake 11.36 ml  Output 2050 ml  Net -2038.64 ml      07/26/2023    5:00 AM 07/25/2023    4:56 AM 09/08/2020   12:24 PM  Last 3 Weights  Weight (lbs) 262 lb 14.4 oz 264 lb 15.9 oz 250 lb  Weight (kg) 119.251 kg 120.2 kg 113.399 kg      Telemetry/ECG    SR RBBB - Personally Reviewed  Physical Exam .   GEN: No acute distress.   Neck: No JVD Cardiac: RRR, no murmurs, rubs, or gallops.  Respiratory: Clear to auscultation bilaterally. GI: Soft, nontender, non-distended  MS: No edema  Assessment & Plan .     [name]. is a 73 y.o. male with a hx of paroxysmal atrial fibrillation on eliquis, HFpEF, mild nonobstructive CAD s/p cath in 2010, alcohol use, cannabis use, history of tobacco use, mild mitral stenosis, hyperlipidemia, lower extremity edema, COPD, T2DM, HTN, BIPAP, CKD2, remote history of intracranial hemorrhage, remote history of provoked DVT who is being seen 07/25/2023 for the evaluation of acute hypoxic respiratory failure at the request of [name].   NSTEMI Acute hypoxic respiratory failure Acute on chronic HFpEF vs HFmrEF H/o pulmonary HTN Mild mitral stenosis HTN Hyponatremia The shortness of breath started suddenly at about noon on 07/24/23 when he was at [facility]. Because of this he called EMS. Per EMS patient initially has an SPO2 of 57% prior to starting BIPAP and getting albuterol nebulizers. PTA on lasix  20mg  for HFpEF and lower extremity edema. D-dimer was mildly elevated but reported that he has not missed any doses of his eliquis. Labs showed BNP 243.3, Chest x-ray showed Asymmetric edema vs PNA. Was started on IV lasix  40mg , received 2 doses thus far with improvement back to room air. Daily weights, I/O's not available. - trop elevated 7000, with RWMA, plan for cath.  - continue IV Lasix  at 40mg  BID dosing, diuresing well.  - if no obstructive CAD, consider stress cardiomyopathy in setting of hypertensive emergency and likely flash pulm edema as source of symptoms from acute rise in LVEDP. - Cath tomorrow, consent as below. Right and left heart cath.   Informed Consent   Shared Decision Making/Informed Consent The risks [stroke (1 in 1000), death (1 in 1000), kidney failure [usually temporary] (1 in 500), bleeding (1 in 200), allergic reaction [possibly serious] (1 in 200)], benefits (diagnostic support and management of coronary artery disease) and alternatives of a cardiac catheterization were discussed in detail with [name] and he is willing to proceed.     GDMT - Patient affirms he  is taking Entresto despite prior possible issues with ACEI/ARB. Ok to continue after cath or if needed sooner for bp control. - Continue Coreg  25mg  BID - Hold home Jardiance 25mg  daily. Plan to start prior to discharge      Paroxysmal Atrial fibrillation CHA2DS2-VASc Score = 5 [CHF History: 1, HTN History: 1, Diabetes History: 1, Stroke History: 0, Vascular Disease History: 1, Age Score: 1, Gender Score: 0].  Therefore, the patient's annual risk of stroke is 7.2 %.    In 2023 developed pneumonia in [place] and developed atrial fibrillation. Wore a monitor after discharge that showed 44% burden of atrial fibrillation. On long term anticoagulation with  Eliquis 5 mg BID per his primary cardiologist.  In sinus rhythm on telemetry -- stop Eliquis 5 mg BID in anticipation of cath, resume afterward. -- IV heparin for [facility] for Afib and NSTEMI   Mild nonobstructive CAD s/p cath in 2010 Hyperlipidemia  - LDL elevated at 154, Goal <629. - On Zetia that appears like it was started on 06/29/23 by [place]. Appears to have had adverse reaction to statin in the past. Now reported to have intolerance to Zetia. - Would explore PCSK9i in follow-up at the [place].   Alcohol use - On CIWA - recommend reducing alcohol intake. Patient is willing to do this. - abdomen distended. May want to consider evaluation for cirrhosis. - Management per primary   Diabetes mellitus - per primary. Holding sglt2i for procedure.   Cannabis use History of tobacco use COPD - May contribute to shortness of breath - Management per primary   Other conditions managed per primary For questions or updates, please contact [facility] Please consult [email or web address] for contact info under        Signed, Laramie Meissner A Sierrah Luevano, MD

## 2023-07-26 NOTE — Assessment & Plan Note (Addendum)
 Reportedly drinks 3 beers nightly, last drink was last night.  No history of WD seizures or alcohol WD.  CIWAs remain 0. Does have mild-moderate hyponatremia, suspect 2/2 beer intake. - CIWAs Q6h, discontinue 5/30 - Thiamine , folate, and MVI daily - Consult to [facility]

## 2023-07-26 NOTE — Progress Notes (Signed)
 PHARMACY - ANTICOAGULATION CONSULT NOTE  Pharmacy Consult for heparin  Indication: atrial fibrillation  Allergies  Allergen Reactions   Ace Inhibitors Other (See Comments)    Angioedema per pt report to admiting MD On 07/31/2019 - lisinopril    Gabapentin Swelling and Other (See Comments)     Influenza-like illness   Peanut Oil Shortness Of Breath    Swelling throat, sob  Other reaction(s): anaphylaxis   Tamiflu [Oseltamivir Phosphate] Shortness Of Breath   Losartan Swelling    Other reaction(s): Lip swelling   Lyrica [Pregabalin] Swelling   Oxcarbazepine Swelling and Other (See Comments)    Lip swelling   Statins Other (See Comments)    Myalgia -- atorvastatin, simvastatin   Avelox [Moxifloxacin Hcl In Nacl] Rash   Ciprofloxacin  Anxiety   Citalopram Anxiety    Other reaction(s): Drowsy, Anxiety   Victoza [Liraglutide] Other (See Comments)    [facility]    Patient Measurements: Height: 6\' 2"  (188 cm) Weight: 119.3 kg (262 lb 14.4 oz) IBW/kg (Calculated) : 82.2 HEPARIN DW (KG): 107.7  Vital Signs: Temp: 98.3 F (36.8 C) (05/29 0815) BP: 119/53 (05/29 0815) Pulse Rate: 73 (05/29 0815)  Labs: Recent Labs    07/24/23 1224 07/25/23 0613 07/25/23 1524 07/25/23 1800 07/25/23 2006 07/25/23 2358 07/26/23 0727 07/26/23 0907  HGB 17.0  --   --   --   --   --   --   --   HCT 50.4  --   --   --   --   --   --   --   PLT 308  --   --   --   --   --   --   --   APTT  --   --   --   --   --  53*  --  70*  HEPARINUNFRC  --   --   --   --   --   --   --  >1.10*  CREATININE 0.96 0.95  --   --   --   --  1.01  --   TROPONINIHS  --   --  7,064* 6,267* 6,105*  --   --   --     Estimated Creatinine Clearance: 90.7 mL/min (by C-G formula based on SCr of 1.01 mg/dL).   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 He was on apixaban PTA for afib with remote hx of ICH. Cardiology recommends lifelong anticoagulation. Holding apixaban in anticipation of cath 5/30, last dose 5/27 at 5am. Pharmacy consulted for heparin.    aPTT therapeutic (70) on heparin 1900 units/hr. No bleeding noted, CBC stable.  Heparin  level still impacted by recent apixaban .   Given recent apixaban  use, will monitor anticoagulation using aPTT until aPTT and heparin  levels correlate.   Goal of Therapy:  Heparin  level 0.3-0.7 units/ml aPTT 66-102 seconds Monitor platelets by anticoagulation protocol: Yes   Plan:  Continue Heparin  infusion to 1900 units/hr F/u aPTT until correlates with heparin  level  F/u 6hr confirmatory aPTT Monitor daily aPTT, heparin  level, CBC, signs/symptoms of bleeding  F/u after cath on 5/30   Rockie Churchman, Student Pharmacist   Please check AMION for all [facility] Pharmacy phone numbers After 10:00 PM, call Main Pharmacy [phone]

## 2023-07-26 NOTE — Assessment & Plan Note (Addendum)
 Last A1c on record June 2022 is 7.3, improved to 6.2 this admission.  Does have CGM. Home regimen: Lantus  20 units BID, NOT taking Jardiance - CBGs ACHS (does have CGM, but per pharmacy prefer to still do CBGs) - Increased to resistant SSI - Carb modified diet - Continue LAI 20 units daily, hold 5/30 for L and R HC

## 2023-07-26 NOTE — Progress Notes (Signed)
 Patient Name: [name]. Date of Encounter: 07/26/2023 [facility] Cardiologist: None   Interval Summary  .    No CP. No significant SOB. He notes that a medication he received yesterday evening made his lips burn, recommend review with pharmacist.    Vital Signs .    Vitals:   07/25/23 2015 07/25/23 2035 07/26/23 0446 07/26/23 0500  BP: (!) 159/89 (!) 154/63 (!) 150/64   Pulse: 93 71 71   Resp: 18 18 18    Temp: (!) 97.5 F (36.4 C) 97.7 F (36.5 C) (!) 97.5 F (36.4 C)   TempSrc:  Oral    SpO2: 97% 97% 97%   Weight:    119.3 kg  Height:        Intake/Output Summary (Last 24 hours) at 07/26/2023 0810 Last data filed at 07/25/2023 2100 Gross per 24 hour  Intake 11.36 ml  Output 2050 ml  Net -2038.64 ml      07/26/2023    5:00 AM 07/25/2023    4:56 AM 09/08/2020   12:24 PM  Last 3 Weights  Weight (lbs) 262 lb 14.4 oz 264 lb 15.9 oz 250 lb  Weight (kg) 119.251 kg 120.2 kg 113.399 kg      Telemetry/ECG    SR RBBB - Personally Reviewed  Physical Exam .   GEN: No acute distress.   Neck: No JVD Cardiac: RRR, no murmurs, rubs, or gallops.  Respiratory: Clear to auscultation bilaterally. GI: Soft, nontender, non-distended  MS: No edema  Assessment & Plan .     [name]. is a 73 y.o. male with a hx of paroxysmal atrial fibrillation on eliquis, HFpEF, mild nonobstructive CAD s/p cath in 2010, alcohol use, cannabis use, history of tobacco use, mild mitral stenosis, hyperlipidemia, lower extremity edema, COPD, T2DM, HTN, BIPAP, CKD2, remote history of intracranial hemorrhage, remote history of provoked DVT who is being seen 07/25/2023 for the evaluation of acute hypoxic respiratory failure at the request of Patrica Bookman.   NSTEMI Acute hypoxic respiratory failure Acute on chronic HFpEF vs HFmrEF H/o pulmonary HTN Mild mitral stenosis HTN Hyponatremia The shortness of breath started suddenly at about noon on 07/24/23 when he was at [facility]  [place]. Because of this he called EMS. Per EMS patient initially has an SPO2 of 57% prior to starting BIPAP and getting albuterol nebulizers. PTA on lasix  20mg  for HFpEF and lower extremity edema. D-dimer was mildly elevated but reported that he has not missed any doses of his eliquis. Labs showed BNP 243.3, Chest x-ray showed Asymmetric edema vs PNA. Was started on IV lasix  40mg , received 2 doses thus far with improvement back to room air. Daily weights, I/O's not available. - trop elevated 7000, with RWMA, plan for cath.  - continue IV Lasix  at 40mg  BID dosing, diuresing well.  - if no obstructive CAD, consider stress cardiomyopathy in setting of hypertensive emergency and likely flash pulm edema as source of symptoms from acute rise in LVEDP. - Cath tomorrow, consent as below. Right and left heart cath.   Informed Consent   Shared Decision Making/Informed Consent The risks [stroke (1 in 1000), death (1 in 1000), kidney failure [usually temporary] (1 in 500), bleeding (1 in 200), allergic reaction [possibly serious] (1 in 200)], benefits (diagnostic support and management of coronary artery disease) and alternatives of a cardiac catheterization were discussed in detail with [name] and he is willing to proceed.     GDMT - Patient affirms he  is taking Entresto despite prior possible issues with ACEI/ARB. Ok to continue after cath or if needed sooner for bp control. - Continue Coreg  25mg  BID - Hold home Jardiance 25mg  daily. Plan to start prior to discharge      Paroxysmal Atrial fibrillation CHA2DS2-VASc Score = 5 [CHF History: 1, HTN History: 1, Diabetes History: 1, Stroke History: 0, Vascular Disease History: 1, Age Score: 1, Gender Score: 0].  Therefore, the patient's annual risk of stroke is 7.2 %.    In 2023 developed pneumonia in [place] and developed atrial fibrillation. Wore a monitor after discharge that showed 44% burden of atrial fibrillation. On long term anticoagulation with  Eliquis 5 mg BID per his primary cardiologist.  In sinus rhythm on telemetry -- stop Eliquis 5 mg BID in anticipation of cath, resume afterward. -- IV heparin for [facility] for Afib and NSTEMI   Mild nonobstructive CAD s/p cath in 2010 Hyperlipidemia  - LDL elevated at 154, Goal <629. - On Zetia that appears like it was started on 06/29/23 by [place]. Appears to have had adverse reaction to statin in the past. Now reported to have intolerance to Zetia. - Would explore PCSK9i in follow-up at the [place].   Alcohol use - On CIWA - recommend reducing alcohol intake. Patient is willing to do this. - abdomen distended. May want to consider evaluation for cirrhosis. - Management per primary   Diabetes mellitus - per primary. Holding sglt2i for procedure.   Cannabis use History of tobacco use COPD - May contribute to shortness of breath - Management per primary   Other conditions managed per primary For questions or updates, please contact [facility] Please consult [email or web address] for contact info under        Signed, Laramie Meissner A Sierrah Luevano, MD

## 2023-07-26 NOTE — Plan of Care (Signed)

## 2023-07-26 NOTE — Progress Notes (Signed)
 PHARMACY - ANTICOAGULATION CONSULT NOTE  Pharmacy Consult for heparin   Indication: atrial fibrillation  Allergies  Allergen Reactions   Ace Inhibitors Other (See Comments)    Angioedema per pt report to admiting MD On 07/31/2019 - lisinopril    Gabapentin Swelling and Other (See Comments)     Influenza-like illness   Peanut Oil Shortness Of Breath    Swelling throat, sob  Other reaction(s): anaphylaxis   Tamiflu [Oseltamivir Phosphate] Shortness Of Breath   Losartan Swelling    Other reaction(s): Lip swelling   Lyrica [Pregabalin] Swelling   Oxcarbazepine Swelling and Other (See Comments)    Lip swelling   Statins Other (See Comments)    Myalgia -- atorvastatin, simvastatin   Avelox [Moxifloxacin Hcl In Nacl] Rash   Ciprofloxacin  Anxiety   Citalopram Anxiety    Other reaction(s): Drowsy, Anxiety   Victoza [Liraglutide] Other (See Comments)    [facility]    Patient Measurements: Height: 6\' 2"  (188 cm) Weight: 119.3 kg (262 lb 14.4 oz) IBW/kg (Calculated) : 82.2 HEPARIN  DW (KG): 107.7  Vital Signs: Temp: 98 F (36.7 C) (05/29 1603) BP: 153/60 (05/29 1603) Pulse Rate: 62 (05/29 1603)  Labs: Recent Labs    07/24/23 1224 07/25/23 0613 07/25/23 1524 07/25/23 1800 07/25/23 2006 07/25/23 2358 07/26/23 0727 07/26/23 0907 07/26/23 1416 07/26/23 1620  HGB 17.0  --   --   --   --   --   --   --  16.3  --   HCT 50.4  --   --   --   --   --   --   --  48.0  --   PLT 308  --   --   --   --   --   --   --  260  --   APTT  --   --   --   --   --  53*  --  70*  --  104*  HEPARINUNFRC  --   --   --   --   --   --   --  >1.10*  --   --   CREATININE 0.96 0.95  --   --   --   --  1.01  --   --   --   TROPONINIHS  --   --  7,064* 6,267* 6,105*  --   --   --   --   --     Estimated Creatinine Clearance: 90.7 mL/min (by C-G formula based on SCr of 1.01 mg/dL).   Medical History: Past Medical History:  Diagnosis Date   Allergy    Anxiety    Arthritis    Cataract     bilateral lens implants   Clotting disorder (HCC)    DVT in 2006 x1; no problems since   Colon stricture - diverticular (HCC) 09/07/2019   Diabetes mellitus    2   Diverticulitis    Diverticulosis    DVT (deep venous thrombosis) (HCC) 2007   left leg   Hepatitis 1973   treated at [facility]   HLD (hyperlipidemia)    Hypertension    Neuromuscular disorder (HCC)    neuropathy   Peripheral vascular disease (HCC) 2006   dvt 's   Pneumonia 2006   RBBB    Sleep apnea    Subdural hematoma (HCC)     Assessment: 73 yo M admitted for CHF exacerbation, with new NSTEMI. He was on apixaban  PTA for afib with remote  hx of ICH. Cardiology recommends lifelong anticoagulation. Holding apixaban in anticipation of cath 5/30, last dose 5/27 at 5am. Pharmacy consulted for heparin.    aPTT slightly Supra-therapeutic at 104 on heparin 1900 units/hr. No bleeding noted, CBC stable.   Heparin level still impacted by recent apixaban.   Given recent apixaban use, will monitor anticoagulation using aPTT until aPTT and heparin levels correlate.   Goal of Therapy:  Heparin level 0.3-0.7 units/ml aPTT 66-102 seconds Monitor platelets by anticoagulation protocol: Yes   Plan:  Reduce Heparin infusion to 1750 units/hr F/u aPTT in 8 hours and until correlates with heparin level  Monitor daily aPTT, heparin level, CBC, signs/symptoms of bleeding  F/u after cath on 5/30    Thank you for allowing pharmacy to be a part of this patient's care.   Victoria Grass, PharmD 07/26/2023 7:12 PM  **Pharmacist phone directory can be found on [email or web address] listed under [facility]**

## 2023-07-26 NOTE — Progress Notes (Signed)
 Daily Progress Note Intern Pager: [phone]  Patient name: [name]. Medical record number: [id number] Date of birth: September 02, 1950 Age: 73 y.o. Gender: male  Primary Care Provider: [facility], [facility] Consultants: Cardiology Code Status: FULL  Pt Overview and Major Events to Date:  5/27 - Admitted, started diuresis with IV Lasix  5/28 - Cardiology consulted, TTE with LV RWMAb, Troponin 7K, loaded ASA & started heparin gtt 5/30 - L/R HC planned   Assessment and Plan: [name]. is a 73 y.o. male with a pertinent PMH of T2DM, A-fib on Eliquis, HTN, HLD, RBBB, and OSA on CPAP who presented with AHRF and was admitted for suspected CHF exacerbation, now with diagnosed NSTEMI and pending L/R [facility] per Cardiology tomorrow. Assessment & Plan NSTEMI (non-ST elevated myocardial infarction) (HCC)  Cardiomyopathy TTE with regional LV WMAb.  Troponins elevated and slightly downtrending.  Suspect cardiomyopathy and will follow Cardiology lead.  AHRF on admission could have been flash pulmonary edema 2/2 cardiomyopathy or ACS, now stable on room air. 24 hour UOP of 2 L, net negative 2 L this admission. Weight down to 119.3 kg from 120.2 kg. K stable and Mag replenished.  Creatinine stable. No I&Os documented in ED, unable to quantify output with Lasix .  Baseline weight 120.2 kg. - Strict I&Os, daily weights, and heart healthy diet - Continuous cardiac monitoring and pulse oximetry - AM BMP and Mag, Mag; goal K>4, Mag>2 - Cardiology consulted, appreciate recommendations:  -Heparin gtt for heparin  -Loaded with ASA 324 mg, continue ASA 81 mg daily  -Pantoprazole 40 mg daily  -Continue carvedilol  25 mg BID  -Likely resume Entresto, decision pending  -Hold Jardiance until DC  -Lasix  40 mg IV BID  -L/R HC 5/30 COPD (chronic obstructive pulmonary disease) (HCC) Do not favor exacerbation as no history of cough and no wheeze on exam. Home regimen: Albuterol PRN (using a few  times monthly) and Spiriva Respimat daily - DuoNebs Q6h PRN - Start Incruse Ellipta (Spiriva Respimat formulary equivalent) daily - O2 sat goa 88-92%, BiPAP nightly for OSA - Recommended marijuana cessation or at least discontinuing smoking inhalation Alcohol use  Hyponatremia Reportedly drinks 3 beers nightly, last drink was last night.  No history of WD seizures or alcohol WD.  CIWAs remain 0. Does have mild-moderate hyponatremia, suspect 2/2 beer intake. - CIWAs Q6h, discontinue 5/30 - Thiamine , folate, and MVI daily - Consult to [facility] T2DM (type 2 diabetes mellitus) (HCC) Last A1c on record June 2022 is 7.3, improved to 6.2 this admission.  Does have CGM. Home regimen: Lantus  20 units BID, NOT taking Jardiance - CBGs ACHS (does have CGM, but per pharmacy prefer to still do CBGs) - Increased to resistant SSI - Carb modified diet - Continue LAI 20 units daily, hold 5/30 for L and R HC Chronic health problem Afib: Eliquis 5 mg BID held for heparin gtt, 44% burden Afib on monitor outpatient and on longterm AC Insomnia: Continue home trazodone 100 mg QHS and hydroxyzine  25 mg BID PRN HTN: As per NSTEMI above HLD: Not on ezetimibe and statin due to intolerance; LDL 154 and may need Repatha outpatient Allergies: Fluticasone  daily, olopatadine  eye drops BID, cromolyn drops QID OSA: BiPAP per RT nightly CKD2: Recently diagnosed, Cr stable on admission  FEN/GI: NPO since MN then back to hearth healthy/carb modified PPx: Heparin gtt Dispo: Pending PT recommendations  and Cardiology workup. Barriers include L/RHC.  Subjective:  This AM, patient reports he is doing well overall  and breathing better.  States he felt some tingling in his lips and hands last night, please resolved after 1 hour.  Reports of anxiety after was told he would have a heart attack, but more reassured now that he is clinically stable.  Objective: Temp:  [97.5 F (36.4 C)-98.1 F (36.7 C)] 97.5 F (36.4 C) (05/29  0446) Pulse Rate:  [71-93] 71 (05/29 0446) Resp:  [18] 18 (05/29 0446) BP: (128-161)/(58-89) 150/64 (05/29 0446) SpO2:  [97 %-99 %] 97 % (05/29 0446) FiO2 (%):  [30 %] 30 % (05/28 2355) Weight:  [119.3 kg] 119.3 kg (05/29 0500)  Physical Exam: General: Age-appropriate, resting comfortably in bed, NAD, alert and at baseline. Cardiovascular: Quiet heart sounds, but regular rate and rhythm. Normal S1/S2. No murmurs, rubs, or gallops appreciated. 2+ radial pulses. Pulmonary: Mild bibasilar crackles, L>R; no wheezes or rhonchi. Normal WOB on room air, no accessory muscle usage. Abdominal: Normoactive bowel sounds, nondistended. No tenderness to deep or light palpation. No rebound or guarding. No HSM. Skin: Warm and dry. Extremities: No peripheral edema bilaterally. Capillary refill <2 seconds.  Laboratory: Most recent CBC Lab Results  Component Value Date   WBC 10.3 07/24/2023   HGB 17.0 07/24/2023   HCT 50.4 07/24/2023   MCV 88.9 07/24/2023   PLT 308 07/24/2023   Most recent BMP    Latest Ref Rng & Units 07/25/2023    6:13 AM  BMP  Glucose 70 - 99 mg/dL 161   BUN 8 - 23 mg/dL 21   Creatinine 0.96 - 1.24 mg/dL 0.45   Sodium 409 - 811 mmol/L 128   Potassium 3.5 - 5.1 mmol/L 4.3   Chloride 98 - 111 mmol/L 95   CO2 22 - 32 mmol/L 23   Calcium  8.9 - 10.3 mg/dL 8.4     Other pertinent labs: -Heparin levels per pharmacy  New Imaging/Diagnostic Tests: -TTE: Regional WMA, G2DD, LA mildly dilated.  Kinzlee Selvy Lansing Planas, MD 07/26/2023, 7:32 AM  PGY-1, [facility] FPTS Intern pager: [phone], text pages welcome Secure chat group [facility] Teaching Service

## 2023-07-26 NOTE — Assessment & Plan Note (Signed)
 Do not favor exacerbation as no history of cough and no wheeze on exam. Home regimen: Albuterol PRN (using a few times monthly) and Spiriva Respimat daily - DuoNebs Q6h PRN - Start Incruse Ellipta (Spiriva Respimat formulary equivalent) daily - O2 sat goa 88-92%, BiPAP nightly for OSA - Recommended marijuana cessation or at least discontinuing smoking inhalation

## 2023-07-26 NOTE — Assessment & Plan Note (Signed)
 Afib: Eliquis 5 mg BID held for heparin gtt, 44% burden Afib on monitor outpatient and on longterm AC Insomnia: Continue home trazodone 100 mg QHS and hydroxyzine  25 mg BID PRN HTN: As per NSTEMI above HLD: Not on ezetimibe and statin due to intolerance; LDL 154 and may need Repatha outpatient Allergies: Fluticasone  daily, olopatadine  eye drops BID, cromolyn drops QID OSA: BiPAP per RT nightly CKD2: Recently diagnosed, Cr stable on admission

## 2023-07-26 NOTE — Plan of Care (Signed)
  Problem: Education: Goal: Ability to describe self-care measures that may prevent or decrease complications (Diabetes Survival Skills Education) will improve Outcome: Progressing   Problem: Coping: Goal: Ability to adjust to condition or change in health will improve Outcome: Progressing   Problem: Fluid Volume: Goal: Ability to maintain a balanced intake and output will improve Outcome: Progressing   Problem: Health Behavior/Discharge Planning: Goal: Ability to identify and utilize available resources and services will improve Outcome: Progressing   Problem: Metabolic: Goal: Ability to maintain appropriate glucose levels will improve Outcome: Progressing   Problem: Nutritional: Goal: Maintenance of adequate nutrition will improve Outcome: Progressing   Problem: Clinical Measurements: Goal: Ability to maintain clinical measurements within normal limits will improve Outcome: Progressing   Problem: Pain Managment: Goal: General experience of comfort will improve and/or be controlled Outcome: Progressing   Problem: Safety: Goal: Ability to remain free from injury will improve Outcome: Progressing

## 2023-07-27 ENCOUNTER — Inpatient Hospital Stay (HOSPITAL_COMMUNITY)

## 2023-07-27 ENCOUNTER — Encounter (HOSPITAL_COMMUNITY)
Admission: EM | Disposition: A | Discharge status: Skilled Nursing Facility | Payer: Self-pay | Source: Home / Self Care | Attending: Thoracic Surgery (Cardiothoracic Vascular Surgery)

## 2023-07-27 DIAGNOSIS — I214 Non-ST elevation (NSTEMI) myocardial infarction: Secondary | ICD-10-CM | POA: Diagnosis not present

## 2023-07-27 DIAGNOSIS — I2510 Atherosclerotic heart disease of native coronary artery without angina pectoris: Secondary | ICD-10-CM | POA: Diagnosis not present

## 2023-07-27 HISTORY — PX: RIGHT/LEFT HEART CATH AND CORONARY ANGIOGRAPHY: CATH118266

## 2023-07-27 LAB — TYPE AND SCREEN
ABO/RH(D): O POS
Antibody Screen: NEGATIVE

## 2023-07-27 LAB — POCT I-STAT 7, (LYTES, BLD GAS, ICA,H+H)
Acid-Base Excess: 4 mmol/L — ABNORMAL HIGH (ref 0.0–2.0)
Bicarbonate: 29.9 mmol/L — ABNORMAL HIGH (ref 20.0–28.0)
Calcium, Ion: 1.12 mmol/L — ABNORMAL LOW (ref 1.15–1.40)
HCT: 43 % (ref 39.0–52.0)
Hemoglobin: 14.6 g/dL (ref 13.0–17.0)
O2 Saturation: 98 %
Potassium: 3.7 mmol/L (ref 3.5–5.1)
Sodium: 129 mmol/L — ABNORMAL LOW (ref 135–145)
TCO2: 31 mmol/L (ref 22–32)
pCO2 arterial: 46.3 mmHg (ref 32–48)
pH, Arterial: 7.419 (ref 7.35–7.45)
pO2, Arterial: 104 mmHg (ref 83–108)

## 2023-07-27 LAB — POCT I-STAT EG7
Acid-Base Excess: 5 mmol/L — ABNORMAL HIGH (ref 0.0–2.0)
Bicarbonate: 30.9 mmol/L — ABNORMAL HIGH (ref 20.0–28.0)
Calcium, Ion: 1.13 mmol/L — ABNORMAL LOW (ref 1.15–1.40)
HCT: 44 % (ref 39.0–52.0)
Hemoglobin: 15 g/dL (ref 13.0–17.0)
O2 Saturation: 75 %
Potassium: 3.7 mmol/L (ref 3.5–5.1)
Sodium: 130 mmol/L — ABNORMAL LOW (ref 135–145)
TCO2: 32 mmol/L (ref 22–32)
pCO2, Ven: 50.2 mmHg (ref 44–60)
pH, Ven: 7.397 (ref 7.25–7.43)
pO2, Ven: 41 mmHg (ref 32–45)

## 2023-07-27 LAB — PROTIME-INR
INR: 0.9 (ref 0.8–1.2)
Prothrombin Time: 12.7 s (ref 11.4–15.2)

## 2023-07-27 LAB — BASIC METABOLIC PANEL WITH GFR
Anion gap: 10 (ref 5–15)
BUN: 20 mg/dL (ref 8–23)
CO2: 28 mmol/L (ref 22–32)
Calcium: 8.6 mg/dL — ABNORMAL LOW (ref 8.9–10.3)
Chloride: 90 mmol/L — ABNORMAL LOW (ref 98–111)
Creatinine, Ser: 0.84 mg/dL (ref 0.61–1.24)
GFR, Estimated: >60 mL/min (ref >60)
Glucose, Bld: 201 mg/dL — ABNORMAL HIGH (ref 70–99)
Potassium: 3.9 mmol/L (ref 3.5–5.1)
Sodium: 128 mmol/L — ABNORMAL LOW (ref 135–145)

## 2023-07-27 LAB — APTT: aPTT: 78 s — ABNORMAL HIGH (ref 24–36)

## 2023-07-27 LAB — MAGNESIUM: Magnesium: 2.2 mg/dL (ref 1.7–2.4)

## 2023-07-27 LAB — GLUCOSE, CAPILLARY
Glucose-Capillary: 124 mg/dL — ABNORMAL HIGH (ref 70–99)
Glucose-Capillary: 207 mg/dL — ABNORMAL HIGH (ref 70–99)
Glucose-Capillary: 144 mg/dL — ABNORMAL HIGH (ref 70–99)
Glucose-Capillary: 152 mg/dL — ABNORMAL HIGH (ref 70–99)

## 2023-07-27 MED ORDER — HEPARIN SODIUM (PORCINE) 1000 UNIT/ML IJ SOLN
INTRAMUSCULAR | Status: AC
Start: 2023-07-27 — End: ?
  Filled 2023-07-27: qty 10

## 2023-07-27 MED ORDER — FUROSEMIDE 40 MG PO TABS
40.0000 mg | ORAL_TABLET | Freq: Every day | ORAL | Status: DC
  Administered 2023-07-28 – 2023-07-29 (×2): 40 mg via ORAL
  Filled 2023-07-27 (×2): qty 1

## 2023-07-27 MED ORDER — IOHEXOL 350 MG/ML SOLN
INTRAVENOUS | Status: DC | PRN
  Administered 2023-07-27: 50 mL

## 2023-07-27 MED ORDER — METOPROLOL TARTRATE 12.5 MG HALF TABLET
12.5000 mg | ORAL_TABLET | Freq: Once | ORAL | Status: AC
Start: 2023-07-30 — End: 2023-07-30
  Administered 2023-07-30: 12.5 mg via ORAL
  Filled 2023-07-27: qty 1

## 2023-07-27 MED ORDER — ALPRAZOLAM 0.25 MG PO TABS
0.2500 mg | ORAL_TABLET | ORAL | Status: DC | PRN
  Administered 2023-07-28 – 2023-07-30 (×6): 0.5 mg via ORAL
  Filled 2023-07-27 (×6): qty 2

## 2023-07-27 MED ORDER — HEPARIN (PORCINE) IN NACL 1000-0.9 UT/500ML-% IV SOLN
INTRAVENOUS | Status: DC | PRN
  Administered 2023-07-27 (×2): 500 mL

## 2023-07-27 MED ORDER — MIDAZOLAM HCL 2 MG/2ML IJ SOLN
INTRAMUSCULAR | Status: DC | PRN
  Administered 2023-07-27: 2 mg via INTRAVENOUS

## 2023-07-27 MED ORDER — LIDOCAINE HCL (PF) 1 % IJ SOLN
INTRAMUSCULAR | Status: AC
  Filled 2023-07-27: qty 30

## 2023-07-27 MED ORDER — HYDRALAZINE HCL 20 MG/ML IJ SOLN
10.0000 mg | INTRAMUSCULAR | Status: AC | PRN
Start: 2023-07-27 — End: 2023-07-27

## 2023-07-27 MED ORDER — LIDOCAINE HCL (PF) 1 % IJ SOLN
INTRAMUSCULAR | Status: DC | PRN
  Administered 2023-07-27 (×2): 2 mL via INTRADERMAL

## 2023-07-27 MED ORDER — MIDAZOLAM HCL 2 MG/2ML IJ SOLN
INTRAMUSCULAR | Status: AC
  Filled 2023-07-27: qty 2

## 2023-07-27 MED ORDER — CHLORHEXIDINE GLUCONATE 0.12 % MT SOLN
15.0000 mL | Freq: Once | OROMUCOSAL | Status: AC
  Administered 2023-07-30: 15 mL via OROMUCOSAL
  Filled 2023-07-27: qty 15

## 2023-07-27 MED ORDER — CHLORHEXIDINE GLUCONATE CLOTH 2 % EX PADS
6.0000 | MEDICATED_PAD | Freq: Once | CUTANEOUS | Status: AC
  Administered 2023-07-30: 6 via TOPICAL

## 2023-07-27 MED ORDER — SODIUM CHLORIDE 0.9 % IV SOLN
250.0000 mL | INTRAVENOUS | Status: AC | PRN
Start: 2023-07-27 — End: 2023-07-28

## 2023-07-27 MED ORDER — HEPARIN (PORCINE) 25000 UT/250ML-% IV SOLN
1750.0000 [IU]/h | INTRAVENOUS | Status: DC
  Administered 2023-07-27 – 2023-07-28 (×2): 1750 [IU]/h via INTRAVENOUS
  Filled 2023-07-27 (×2): qty 250

## 2023-07-27 MED ORDER — FENTANYL CITRATE (PF) 100 MCG/2ML IJ SOLN
INTRAMUSCULAR | Status: AC
  Filled 2023-07-27: qty 2

## 2023-07-27 MED ORDER — HEPARIN SODIUM (PORCINE) 1000 UNIT/ML IJ SOLN
INTRAMUSCULAR | Status: DC | PRN
  Administered 2023-07-27: 6000 [IU] via INTRAVENOUS

## 2023-07-27 MED ORDER — BISACODYL 5 MG PO TBEC: 5.0000 mg | DELAYED_RELEASE_TABLET | Freq: Once | ORAL | Status: DC

## 2023-07-27 MED ORDER — FENTANYL CITRATE (PF) 100 MCG/2ML IJ SOLN
INTRAMUSCULAR | Status: DC | PRN
  Administered 2023-07-27: 25 ug via INTRAVENOUS

## 2023-07-27 MED ORDER — INSULIN GLARGINE-YFGN 100 UNIT/ML ~~LOC~~ SOLN
20.0000 [IU] | Freq: Every day | SUBCUTANEOUS | Status: DC
  Administered 2023-07-28 – 2023-07-29 (×2): 20 [IU] via SUBCUTANEOUS
  Filled 2023-07-27 (×3): qty 0.2

## 2023-07-27 MED ORDER — SODIUM CHLORIDE 0.9% FLUSH
3.0000 mL | Freq: Two times a day (BID) | INTRAVENOUS | Status: DC
  Administered 2023-07-27 – 2023-07-30 (×3): 3 mL via INTRAVENOUS

## 2023-07-27 MED ORDER — CHLORHEXIDINE GLUCONATE CLOTH 2 % EX PADS
6.0000 | MEDICATED_PAD | Freq: Once | CUTANEOUS | Status: AC
  Administered 2023-07-29: 6 via TOPICAL

## 2023-07-27 MED ORDER — DIAZEPAM 5 MG PO TABS: 5.0000 mg | ORAL_TABLET | Freq: Once | ORAL | Status: DC

## 2023-07-27 MED ORDER — VERAPAMIL HCL 2.5 MG/ML IV SOLN
INTRAVENOUS | Status: AC
  Filled 2023-07-27: qty 2

## 2023-07-27 MED ORDER — POTASSIUM CHLORIDE 20 MEQ PO PACK
20.0000 meq | PACK | Freq: Once | ORAL | Status: AC
  Administered 2023-07-27: 20 meq via ORAL
  Filled 2023-07-27: qty 1

## 2023-07-27 MED ORDER — ONDANSETRON HCL 4 MG/2ML IJ SOLN
4.0000 mg | Freq: Four times a day (QID) | INTRAMUSCULAR | Status: DC | PRN
Start: 2023-07-27 — End: 2023-07-30

## 2023-07-27 MED ORDER — SODIUM CHLORIDE 0.9% FLUSH: 3.0000 mL | INTRAVENOUS | Status: DC | PRN

## 2023-07-27 MED ORDER — LABETALOL HCL 5 MG/ML IV SOLN: 10.0000 mg | INTRAVENOUS | Status: AC | PRN

## 2023-07-27 MED ORDER — VERAPAMIL HCL 2.5 MG/ML IV SOLN
INTRAVENOUS | Status: DC | PRN
  Administered 2023-07-27: 10 mL via INTRA_ARTERIAL

## 2023-07-27 NOTE — Assessment & Plan Note (Signed)
 Afib: Eliquis 5 mg BID held for heparin gtt, 44% burden Afib on monitor outpatient and on longterm AC Insomnia: Continue home trazodone 100 mg QHS and hydroxyzine  25 mg BID PRN HTN: As per NSTEMI above HLD: Not on ezetimibe and statin due to intolerance; LDL 154 and may need Repatha outpatient Allergies: Fluticasone  daily, olopatadine  eye drops BID, cromolyn drops QID OSA: BiPAP per RT nightly CKD2: Recently diagnosed, Cr stable on admission

## 2023-07-27 NOTE — Assessment & Plan Note (Signed)
 CIWAs discontinued with no signs of withdrawal - Thiamine , folate, and MVI daily - Consult to [facility]

## 2023-07-27 NOTE — Plan of Care (Signed)

## 2023-07-27 NOTE — Assessment & Plan Note (Signed)
 Do not favor exacerbation as no history of cough and no wheeze on exam.  Not needing DuoNebs. Home regimen: Albuterol PRN (using a few times monthly) and Spiriva Respimat daily - DuoNebs Q6h PRN - Incruse Ellipta (Spiriva Respimat formulary equivalent) daily - O2 sat goal 88-92%, BiPAP nightly for OSA - Recommended marijuana cessation or at least discontinuing smoking inhalation

## 2023-07-27 NOTE — Progress Notes (Signed)
 Cardiovascular Thoracic PA in room to consult with patient. Will transport patient to his room when they are finished with their consult.

## 2023-07-27 NOTE — Progress Notes (Signed)
 PHARMACY - ANTICOAGULATION CONSULT NOTE  Pharmacy Consult for Heparin  Indication: atrial fibrillation, ACS/NSTEMI, CAD awaiting CABG  Allergies  Allergen Reactions   Ace Inhibitors Other (See Comments)    Angioedema per pt report to admiting MD On 07/31/2019 - lisinopril    Gabapentin Swelling and Other (See Comments)     Influenza-like illness   Peanut Oil Shortness Of Breath    Swelling throat, sob  Other reaction(s): anaphylaxis   Tamiflu [Oseltamivir Phosphate] Shortness Of Breath   Losartan Swelling    Other reaction(s): Lip swelling   Lyrica [Pregabalin] Swelling   Oxcarbazepine Swelling and Other (See Comments)    Lip swelling   Statins Other (See Comments)    Myalgia -- atorvastatin, simvastatin   Avelox [Moxifloxacin Hcl In Nacl] Rash   Ciprofloxacin  Anxiety   Citalopram Anxiety    Other reaction(s): Drowsy, Anxiety   Victoza [Liraglutide] Other (See Comments)    [facility]anc[facility]    Patient Measurements: Height: 6\' 2"  (188 cm) Weight: 119.7 kg (264 lb) IBW/kg (Calculated) : 82.2 HEPARIN  DW (KG): 107.7  Vital Signs: Temp: 97.8 F (36.6 C) (05/30 1942) Temp Source: Oral (05/30 1600) BP: 135/63 (05/30 1942) Pulse Rate: 61 (05/30 1942)  Labs: Recent Labs    07/25/23 0613 07/25/23 1524 07/25/23 1800 07/25/23 2006 07/25/23 2358 07/26/23 0727 07/26/23 0907 07/26/23 1416 07/26/23 1620 07/27/23 0805 07/27/23 1201 07/27/23 2024  HGB  --   --   --   --   --   --   --  16.3  --  15.0  14.6  --   --   HCT  --   --   --   --   --   --   --  48.0  --  44.0  43.0  --   --   PLT  --   --   --   --   --   --   --  260  --   --   --   --   APTT  --   --   --   --    < >  --  70*  --  104*  --   --  78*  LABPROT  --   --   --   --   --   --   --   --   --   --   --  12.7  INR  --   --   --   --   --   --   --   --   --   --   --  0.9  HEPARINUNFRC  --   --   --   --   --   --  >1.10*  --   --   --   --   --   CREATININE 0.95  --   --   --   --  1.01  --   --    --   --  0.84  --   TROPONINIHS  --  7,064* 6,267* 6,105*  --   --   --   --   --   --   --   --    < > = values in this interval not displayed.    Estimated Creatinine Clearance: 109.3 mL/min (by C-G formula based on SCr of 0.84 mg/dL).  Assessment: 73 yo M admitted for CHF exacerbation, with new NSTEMI. He was on apixaban  PTA for afib with  remote hx of ICH. Cardiology recommends lifelong anticoagulation. Holding apixaban in anticipation of cath 5/30, last dose 5/27 at 5am. Pharmacy consulted for heparin. Will monitor heparin dosing with aPTTs while heparin levels are falsely elevated due to recent apixaban use.  Heparin off this morning for cardiac cath. Multivessel CAD.  Heparin resumed post-cath ~2 hours after TR band off.    aPTT is therapeutic (78 seconds) tonight on 1750 units/hr.  Goal of Therapy:  Heparin level 0.3-0.7 units/ml aPTT 66-102 seconds Monitor platelets by anticoagulation protocol: Yes   Plan:  Continue heparin drip at 1750 units/hr Daily aPTT and heparin level until correlating; daily CBC. Apixaban on hold. For CABG and LAA clip on Monday  07/30/23.  Adolphus Akin, RPh 07/27/2023,9:51 PM

## 2023-07-27 NOTE — Interval H&P Note (Signed)
 History and Physical Interval Note:  07/27/2023 7:28 AM  Alan Margo.  has presented today for surgery, with the diagnosis of ejection fracture.  The various methods of treatment have been discussed with the patient and family. After consideration of risks, benefits and other options for treatment, the patient has consented to  Procedure(s): RIGHT/LEFT HEART CATH AND CORONARY ANGIOGRAPHY (N/A) as a surgical intervention.  The patient's history has been reviewed, patient examined, no change in status, stable for surgery.  I have reviewed the patient's chart and labs.  Questions were answered to the patient's satisfaction.     Arnoldo Lapping

## 2023-07-27 NOTE — Progress Notes (Signed)
 Discussed cardiac catheterization with Dr. Arlester Ladd with patient in the lab.  Plan for cardiac surgical consult given severity of disease, however with diffuse multivessel disease, if not a candidate for durable CABG, would plan aggressive medical therapy as he is not a candidate for PCI. -Await TCTS opinion.   Holy [place] A Makya Phillis, MD

## 2023-07-27 NOTE — Evaluation (Signed)
 Physical Therapy Evaluation Patient Details Name: [name]. MRN: [id number] DOB: 14-Oct-1950 Today's Date: 07/27/2023  History of Present Illness  Pt is a 73 y/o M admitted on 07/24/23 after presenting with c/o SOB. Pt found to have NSTEMI, now s/p L/R [facility] which showed significant LAD stenosis. PMH: DM2, a-fib on Eliquis , HTN, HLD, RBBB, OSA on CPAP, anxiety, diverticulitis, neuropathy, Hepatitis, SDH  Clinical Impression  Pt seen for PT evaluation with pt agreeable to tx. Pt reports prior to admission he was independent without AD, still working part time, able to negotiate 3 flights of stairs at work but with difficulty. On this date, pt is able to complete sit<>stand without AD with independence & ambulate around unit without AD without LOB mod I. At this time, pt does not require acute PT services but anticipate pt will need new re-evaluation s/p procedure. Will d/c current orders at this time.        If plan is discharge home, recommend the following:     Can travel by private vehicle        Equipment Recommendations None recommended by PT  Recommendations for Other Services       Functional Status Assessment Patient has not had a recent decline in their functional status     Precautions / Restrictions Precautions Precautions: None Restrictions Weight Bearing Restrictions Per Provider Order: No      Mobility  Bed Mobility               General bed mobility comments: not tested, pt received & left sitting EOB    Transfers Overall transfer level: Independent Equipment used: None                    Ambulation/Gait Ambulation/Gait assistance: Modified independent (Device/Increase time) Gait Distance (Feet):  (>150 ft) Assistive device: None Gait Pattern/deviations: Decreased step length - right, Decreased stride length, Decreased step length - left Gait velocity: slightly decreased     General Gait Details: no overt LOB  Stairs             Wheelchair Mobility     Tilt Bed    Modified Rankin (Stroke Patients Only)       Balance Overall balance assessment: Mild deficits observed, not formally tested                                           Pertinent Vitals/Pain Pain Assessment Pain Assessment: Faces Faces Pain Scale: Hurts a little bit Pain Location: cervical stenosis chronic pain Pain Descriptors / Indicators: Discomfort Pain Intervention(s): Monitored during session    Home Living Family/patient expects to be discharged to:: Private residence Living Arrangements: Spouse/significant other Available Help at Discharge: Family;Available 24 hours/day Type of Home: House Home Access: Stairs to enter   [employer] of Steps: 1   Home Layout: One level Home Equipment: Hand held shower head;Shower Counsellor (2 wheels);Rollator (4 wheels);Cane - quad;Cane - single point Additional Comments: Cares for wife who has Parkinson's (provides PRN physical assistance) & daughters assist him daily    Prior Function Prior Level of Function : Working/employed             Mobility Comments: 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[employer]), independent without AD, negotiating 3 flights of stairs at work (with extra time) ADLs Comments: denies falls, independent with bathing &  dressing     Extremity/Trunk Assessment   Upper Extremity Assessment Upper Extremity Assessment: Overall WFL for tasks assessed    Lower Extremity Assessment Lower Extremity Assessment: Overall WFL for tasks assessed       Communication   Communication Communication: (P) No apparent difficulties    Cognition Arousal: (P) Alert Behavior During Therapy: (P) WFL for tasks assessed/performed   PT - Cognitive impairments: (P) No apparent impairments                         Following commands: (P) Intact       Cueing Cueing Techniques: (P) Verbal cues      General Comments      Exercises Other Exercises Other Exercises: PT provides education re: sternal precautions in anticipation of surgery Monday (per pt report). Pt performs sit<>stand with BUE on thighs then 5x sit<>stand from lowest bed height without BUE support with focus on BLE strengthening; encouraged pt to perform set of 5 x every hour.   Assessment/Plan    PT Assessment Patient does not need any further PT services (will likely need new orders s/p CABG)  PT Problem List         PT Treatment Interventions      PT Goals (Current goals can be found in the Care Plan section)  Acute Rehab PT Goals Patient Stated Goal: surgery & then go home PT Goal Formulation: With patient Time For Goal Achievement: 08/10/23 Potential to Achieve Goals: Good    Frequency       Co-evaluation               AM-PAC PT "6 Clicks" Mobility  Outcome Measure Help needed turning from your back to your side while in a flat bed without using bedrails?: None Help needed moving from lying on your back to sitting on the side of a flat bed without using bedrails?: None Help needed moving to and from a bed to a chair (including a wheelchair)?: None Help needed standing up from a chair using your arms (e.g., wheelchair or bedside chair)?: None Help needed to walk in hospital room?: None Help needed climbing 3-5 steps with a railing? : None 6 Click Score: 24    End of Session   Activity Tolerance: Patient tolerated treatment well Patient left: in bed;with call bell/phone within reach Nurse Communication: Mobility status      Time: 1548-1600 PT Time Calculation (min) (ACUTE ONLY): 12 min   Charges:   PT Evaluation $PT Eval Low Complexity: 1 Low   PT General Charges $$ ACUTE PT VISIT: 1 Visit         Emaline Handsome, PT, DPT 07/27/23, 4:08 PM   Venetta Gill 07/27/2023, 4:06 PM

## 2023-07-27 NOTE — Assessment & Plan Note (Addendum)
 Cardiac Cath show severe  multi-level carotid stenosis. CTS following and considering CABG with left artrial appendage clip tentatively for Monday 07/30/23  ; recommend optimization of medical management including diuresis before procedure. Deemed poor candidate for PCI. - Strict I&Os, daily weights, and heart healthy diet - Continuous cardiac monitoring and pulse oximetry - PT/OT eval and treat - AM BMP and Mag, Mag; goal K>4, Mag>2 -K this morning 3.8, ordered  PO KCL - Cardiology consulted, appreciate recommendations:  -Heparin  gtt and transition to Eliquis  when appropriate  -Loaded with ASA 324 mg, continue ASA 81 mg daily  -Continue carvedilol  25 mg BID  -Likely resume Entresto, decision pending  -Hold Jardiance until DC  -Lasix  40 mg IV BID -CT surgery consulted, follow up recommendations

## 2023-07-27 NOTE — Assessment & Plan Note (Signed)
 Last A1c 6.2 this admission. Currently on 20U LAI with adequate control - CBGs ACHS (does have CGM, but per pharmacy prefer to still do CBGs) - Resistant SSI - Carb modified diet - Continue LAI 20 units daily, held today for L/R [facility] and restart tomorrow 5/30

## 2023-07-27 NOTE — Consult Note (Addendum)
 [address]       [name] [address]             [phone]        [name] [place] Medical Record #[id number] Date of Birth: 10-03-1950  Referring: Arnoldo Lapping, MD  Primary Care: [facility], [facility] Cardiologist:None  Reason for consult: Evaluation for potential surgical revascularization for severe multivessel coronary artery disease.   History of Present Illness:     [name] is a pleasant 72 year old gentleman with a past history of dyslipidemia intolerant of statins, obesity alcohol and cannabis abuse, hypertension, paroxysmal atrial fibrillation on Eliquis, remote history of DVT and former tobacco smoker having quit in 1979.  He was in [employer] on 07/24/2023 when he developed acute onset severe shortness of breath.  EMS was summoned.  He was noted to have a oxygen  saturation of 57%.  He was placed on BiPAP and brought to the emergency room.  In the ED, chest x-ray showed asymmetric pulmonary edema versus pneumonia.  Initial high-sensitivity troponin was over 7000.  EKG showed sinus tachycardia with first-degree AV block, right bundle blanch block, and anterior T wave inversions. [name] was admitted to the hospital and diuresed aggressively with good response.  Serial high-sensitivity troponins remained elevated.  He was taken to the Cath Lab earlier today where coronary angiography demonstrated severe three-vessel coronary artery disease.  CT surgery has been asked to evaluate for consideration of coronary bypass grafting. [name] is retired from his previous occupation as a Chartered certified accountant.  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 He endorses frequent chest tightness and shortness of breath with activities like walking up a hill or upstairs.  Current Activity/ Functional Status: Patient is independent with mobility/ambulation, transfers, ADL's, IADL's.   Zubrod Score: At the  time of surgery this patient's most appropriate activity status/level should be described as: []     0    Normal activity, no symptoms [x]     1    Restricted in physical strenuous activity but ambulatory, able to do out light work []     2    Ambulatory and capable of self care, unable to do work activities, up and about                 more than 50%  Of the time                            []     3    Only limited self care, in bed greater than 50% of waking hours []     4    Completely disabled, no self care, confined to bed or chair []     5    Moribund  Past Medical History:  Diagnosis Date   Allergy    Anxiety    Arthritis    Cataract    bilateral lens implants   Clotting disorder (HCC)    DVT in 2006 x1; no problems since   Colon stricture - diverticular (HCC) 09/07/2019   Diabetes mellitus    2   Diverticulitis    Diverticulosis    DVT (deep venous thrombosis) (HCC) 2007   left leg   Hepatitis 1973   treated at [facility]   HLD (hyperlipidemia)    Hypertension    Neuromuscular disorder (HCC)    neuropathy   Peripheral vascular disease (HCC) 2006  dvt 's   Pneumonia 2006   RBBB    Sleep apnea    Subdural hematoma [facility])     Past Surgical History:  Procedure Laterality Date   BRAIN SURGERY  2010   subdural hematoma -bleed6 months after heart cath   CATARACT EXTRACTION, BILATERAL  12/16, 2017   cataract   CHOLECYSTECTOMY N/A 09/08/2020   Procedure: LAPAROSCOPIC CHOLECYSTECTOMY WITH INTRAOPERATIVE CHOLANGIOGRAM;  Surgeon: Dareen Ebbing, MD;  Location: [facility] ORS;  Service: General;  Laterality: N/A;   COLONOSCOPY  2014   CYSTOSCOPY WITH INSERTION OF UROLIFT     CYSTOSCOPY WITH INSERTION OF UROLIFT     DG THUMB LEFT HAND Left    "gamers "   ROTATOR CUFF REPAIR W/ DISTAL CLAVICLE EXCISION Left 05/20/2015   SHOULDER ARTHROSCOPY WITH SUBACROMIAL DECOMPRESSION, ROTATOR CUFF REPAIR AND BICEP TENDON REPAIR Left 05/20/2015   Procedure: LEFT SHOULDER ARTHROSCOPY WITH SUBACROMIAL  DECOMPRESSION,DISTAL CLAVICAL RESECTION  ROTATOR CUFF REPAIR ;  Surgeon: Ellard Gunning, MD;  Location: [facility] OR;  Service: Orthopedics;  Laterality: Left;   TONSILLECTOMY      Social History   Tobacco Use  Smoking Status Former   Current packs/day: 0.00   Average packs/day: 1 pack/day for 15.0 years (15.0 ttl pk-yrs)   Types: Cigarettes   Start date: 1964   Quit date: 1979   Years since quitting: 46.4  Smokeless Tobacco Never    Social History   Substance and Sexual Activity  Alcohol Use Yes   Alcohol/week: 10.0 standard drinks of alcohol   Types: 10 Cans of beer per week   Comment: 3 beers per day     Allergies  Allergen Reactions   Ace Inhibitors Other (See Comments)    Angioedema per pt report to admiting MD On 07/31/2019 - lisinopril    Gabapentin Swelling and Other (See Comments)     Influenza-like illness   Peanut Oil Shortness Of Breath    Swelling throat, sob  Other reaction(s): anaphylaxis   Tamiflu [Oseltamivir Phosphate] Shortness Of Breath   Losartan Swelling    Other reaction(s): Lip swelling   Lyrica [Pregabalin] Swelling   Oxcarbazepine Swelling and Other (See Comments)    Lip swelling   Statins Other (See Comments)    Myalgia -- atorvastatin, simvastatin   Avelox [Moxifloxacin Hcl In Nacl] Rash   Ciprofloxacin  Anxiety   Citalopram Anxiety    Other reaction(s): Drowsy, Anxiety   Victoza [Liraglutide] Other (See Comments)    [facility]    Current Facility-Administered Medications  Medication Dose Route Frequency Provider Last Rate Last Admin   0.9 %  sodium chloride  infusion   Intravenous Continuous Adams, Zane, PA-C       [MAR Hold] acetaminophen  (TYLENOL ) tablet 1,000 mg  1,000 mg Oral Q6H PRN Miller, Samantha, DO   1,000 mg at 07/27/23 0235   Or   [MAR Hold] acetaminophen  (TYLENOL ) suppository 650 mg  650 mg Rectal Q6H PRN Miller, Samantha, DO       aspirin  chewable tablet 81 mg  81 mg Oral Pre-Cath Adams, Zane, PA-C       [MAR Hold]  aspirin  EC tablet 81 mg  81 mg Oral Daily Adams, Zane, PA-C   81 mg at 07/26/23 0848   [MAR Hold] carvedilol  (COREG ) tablet 25 mg  25 mg Oral BID Shitarev, Dimitry, MD   25 mg at 07/26/23 2132   [MAR Hold] cromolyn (OPTICROM) 4 % ophthalmic solution 1 drop  1 drop Both Eyes QID PRN Shitarev, Dimitry, MD  fentaNYL  (SUBLIMAZE ) injection    PRN Cooper, Michael, MD   25 mcg at 07/27/23 0750   [MAR Hold] fluticasone  (FLONASE ) 50 MCG/ACT nasal spray 2 spray  2 spray Each Nare Daily Shitarev, Dimitry, MD   2 spray at 07/26/23 0851   [MAR Hold] folic acid  (FOLVITE ) tablet 1 mg  1 mg Oral Daily Shitarev, Dimitry, MD   1 mg at 07/26/23 0851   [MAR Hold] furosemide  (LASIX ) injection 40 mg  40 mg Intravenous BID Adams, Zane, PA-C   40 mg at 07/26/23 1730   Heparin (Porcine) in NaCl 1000-0.9 UT/500ML-% SOLN    PRN Arnoldo Lapping, MD   500 mL at 07/27/23 0741   heparin ADULT infusion 100 units/mL (25000 units/250mL)  1,750 Units/hr Intravenous Continuous McDiarmid, Demetra Filter, MD   Stopped at 07/27/23 0730   heparin sodium (porcine) injection    PRN Arnoldo Lapping, MD   6,000 Units at 07/27/23 0803   [MAR Hold] hydrOXYzine  (ATARAX ) tablet 25 mg  25 mg Oral BID PRN Shitarev, Dimitry, MD   25 mg at 07/26/23 2132   [facility] Hold] insulin  aspart (novoLOG ) injection 0-20 Units  0-20 Units Subcutaneous TID WC Omar Bibber, DO   4 Units at 07/26/23 1730   [MAR Hold] insulin  aspart (novoLOG ) injection 0-5 Units  0-5 Units Subcutaneous QHS Rayma Calandra, DO   3 Units at 07/25/23 2150   iohexol  (OMNIPAQUE ) 350 MG/ML injection    PRN Arnoldo Lapping, MD   50 mL at 07/27/23 0821   [MAR Hold] ipratropium-albuterol (DUONEB) 0.5-2.5 (3) MG/3ML nebulizer solution 3 mL  3 mL Nebulization Q6H PRN Shitarev, Dimitry, MD       lidocaine  (PF) (XYLOCAINE ) 1 % injection    PRN Arnoldo Lapping, MD   2 mL at 07/27/23 0753   midazolam  (VERSED ) injection    PRN Cooper, Michael, MD   2 mg at 07/27/23 0750   [MAR Hold] multivitamin with  minerals tablet 1 tablet  1 tablet Oral Daily Shitarev, Dimitry, MD   1 tablet at 07/26/23 0848   [MAR Hold] olopatadine  (PATANOL) 0.1 % ophthalmic solution 1 drop  1 drop Both Eyes BID Shitarev, Dimitry, MD   1 drop at 07/26/23 2133   [MAR Hold] pantoprazole (PROTONIX) EC tablet 40 mg  40 mg Oral Daily Adams, Zane, PA-C   40 mg at 07/26/23 0848   [MAR Hold] polyethylene glycol (MIRALAX  / GLYCOLAX ) packet 17 g  17 g Oral Daily PRN Shitarev, Dimitry, MD   17 g at 07/26/23 1610   Radial Cocktail/Verapamil only    PRN Arnoldo Lapping, MD   10 mL at 07/27/23 0755   [MAR Hold] sodium chloride  flush (NS) 0.9 % injection 3 mL  3 mL Intravenous Q12H Shitarev, Dimitry, MD   3 mL at 07/26/23 2133   [MAR Hold] terazosin  (HYTRIN ) capsule 2 mg  2 mg Oral QHS Shitarev, Dimitry, MD   2 mg at 07/26/23 2132   [facility] Hold] thiamine  (VITAMIN B1) tablet 100 mg  100 mg Oral Daily Shitarev, Dimitry, MD   100 mg at 07/26/23 0848   Or   [MAR Hold] thiamine  (VITAMIN B1) injection 100 mg  100 mg Intravenous Daily Shitarev, Dimitry, MD       [MAR Hold] traZODone (DESYREL) tablet 100 mg  100 mg Oral QHS Shitarev, Dimitry, MD   100 mg at 07/26/23 2147   [MAR Hold] umeclidinium bromide (INCRUSE ELLIPTA) 62.5 MCG/ACT 1 puff  1 puff Inhalation Daily Shitarev, Dimitry, MD   1  puff at 07/26/23 0902    Medications Prior to Admission  Medication Sig Dispense Refill Last Dose/Taking   apixaban  (ELIQUIS ) 5 MG TABS tablet Take 5 mg by mouth 2 (two) times daily.   07/24/2023 at  5:00 AM   Azelastine HCl 137 MCG/SPRAY SOLN Place 1 spray into both nostrils 2 (two) times daily.    07/24/2023 Morning   carvedilol  (COREG ) 25 MG tablet Take 25 mg by mouth 2 (two) times daily.   07/24/2023 Morning   Cholecalciferol (VITAMIN D) 50 MCG (2000 UT) CAPS Take 1 capsule by mouth every evening.   07/23/2023 Evening   Coenzyme Q10 (COQ10) 100 MG CAPS Take 100 mg by mouth daily.   07/24/2023 Morning   fluticasone  (FLONASE ) 50 MCG/ACT nasal spray Place 2  sprays into both nostrils 2 (two) times daily.   07/24/2023 Morning   furosemide  (LASIX ) 20 MG tablet Take 20 mg by mouth daily.   07/24/2023 Morning   Glucosamine-Chondroit-Vit C-Mn (GLUCOSAMINE 1500 COMPLEX PO) Take 1 tablet by mouth every evening.   07/23/2023 Evening   hydrOXYzine  (VISTARIL ) 25 MG capsule Take 25 mg by mouth 2 (two) times daily as needed (for sleep per patient).   07/23/2023 Evening   insulin  aspart (NOVOLOG ) 100 UNIT/ML injection Inject 0-8 Units into the skin 3 (three) times daily as needed for high blood sugar.    07/23/2023 Evening   insulin  glargine (LANTUS ) 100 UNIT/ML injection Inject 20 Units into the skin 2 (two) times daily.   07/24/2023 Morning   MAGNESIUM  OXIDE PO Take 500 mg by mouth at bedtime.   07/23/2023 Evening   Multiple Vitamin (MULTIVITAMIN WITH MINERALS) TABS Take 1 tablet by mouth every evening.    07/23/2023 Evening   neomycin-polymyxin-hydrocortisone (CORTISPORIN) OTIC solution Place 4 drops into the right ear 4 (four) times daily as needed (itchy ears).   Past Week   olopatadine  (PATANOL) 0.1 % ophthalmic solution Place 1 drop into both eyes 2 (two) times daily.   07/24/2023 Morning   polyethylene glycol powder (GLYCOLAX /MIRALAX ) 17 GM/SCOOP powder Take 34 g by mouth at bedtime.   Past Week   potassium gluconate 595 MG TABS Take 595 mg by mouth every evening.    07/23/2023 Evening   Probiotic Product (PROBIOTIC DAILY PO) Take 2 capsules by mouth every evening.   07/23/2023 Evening   sacubitril-valsartan (ENTRESTO) 97-103 MG Take 1 tablet by mouth 2 (two) times daily.   07/24/2023 Morning   terazosin  (HYTRIN ) 2 MG capsule Take 2 mg by mouth at bedtime.   07/23/2023 Bedtime   Tiotropium Bromide Monohydrate (SPIRIVA RESPIMAT) 2.5 MCG/ACT AERS Inhale 2 puffs into the lungs daily.   Taking   traZODone  (DESYREL ) 100 MG tablet Take 100 mg by mouth at bedtime.   07/23/2023 Bedtime   TURMERIC PO Take 1 tablet by mouth every evening. With Ginger   07/23/2023 Evening   Vitamin  A 2400 MCG (8000 UT) CAPS Take 8,000 Units by mouth every evening.   07/23/2023 Evening   vitamin B-12 (CYANOCOBALAMIN) 500 MCG tablet Take 500 mcg by mouth every evening.   07/23/2023 Evening   cromolyn  (OPTICROM ) 4 % ophthalmic solution Place 1 drop into both eyes 4 (four) times daily as needed (itchy eyes).   Unknown   OVER THE COUNTER MEDICATION Take 1 capsule by mouth at bedtime. Oil of Oregano      tadalafil (CIALIS) 20 MG tablet Take 20 mg by mouth daily as needed for erectile dysfunction.   Unknown    Family  History  Problem Relation Age of Onset   Hypertension Mother    Alzheimer's disease Mother    Stroke Father    Heart attack Maternal Grandfather 85   Cancer Paternal Grandfather        type unknown   Diabetes Brother    Colon polyps Neg Hx    Colon cancer Neg Hx    Liver cancer Neg Hx    Stomach cancer Neg Hx    Rectal cancer Neg Hx      Review of Systems:       Cardiac Review of Systems: Y or  [    ]= no  Chest Pain [ x   ]  Resting SOB [   ] Exertional SOB  [ x ]  Orthopnea [  ]   Pedal Edema [  x ]    Palpitations [  ] Syncope  [  ]   Presyncope [   ]  General Review of Systems: [Y] = yes [  ]=no Constitional: recent weight change [  ]; anorexia [  ]; fatigue [  ]; nausea [  ]; night sweats [  ]; fever [  ]; or chills [  ]                                                               Dental: Last Dentist visit: 6 months  Eye : blurred vision [  ]; diplopia [   ]; vision changes [  ];  Amaurosis fugax[  ]; Resp: cough [  ];  wheezing[  ];  hemoptysis[  ]; shortness of breath[x  ]; paroxysmal nocturnal dyspnea[  ]; dyspnea on exertion[ x ]; or orthopnea[  ];  GI:  gallstones[  ], vomiting[  ];  dysphagia[  ]; melena[  ];  hematochezia [  ]; heartburn[  ];   Hx of  Colonoscopy[  ]; GU: kidney stones [  ]; hematuria[  ];   dysuria [  ];  nocturia[  ];  history of     obstruction [  ]; urinary frequency [  ]             Skin: rash, swelling[  ];, hair loss[  ];   peripheral edema[ x ];  or itching[  ]; Musculosketetal: myalgias[  ];  joint swelling[  ];  joint erythema[  ];  joint pain[  ];  back pain[  ];  Heme/Lymph: bruising[  ];  bleeding[  ];  anemia[  ];  Neuro: TIA[  ];  headaches[  ];  stroke[  ];  vertigo[  ];  seizures[  ];   paresthesias[  ];  difficulty walking[  ];  Psych:depression[  ]; anxiety[  ];  Endocrine: diabetes[ x ];  thyroid dysfunction[  ];              Physical Exam: BP (!) 127/47   Pulse 63   Temp 98.4 F (36.9 C) (Oral)   Resp 16   Ht 6\' 2"  (1.88 m)   Wt 119.7 kg   SpO2 94%   BMI 33.90 kg/m    General appearance: alert, cooperative, and no distress Head: Normocephalic, without obvious abnormality, atraumatic Neck: no adenopathy, no carotid bruit, no JVD, and supple, symmetrical, trachea midline Lymph nodes:  No cervical or clavicular adenopathy Resp: Breath sounds full, equal, and clear to auscultation Cardio: Regular rate and rhythm, soft systolic murmur heard at the right sternal border GI: Obese, soft, nontender.  Active bowel sounds Extremities: No deformities.  All distal pulses are palpable.  He has venous stasis changes in both lower extremities Neurologic: Grossly normal  Diagnostic Studies & Laboratory data:   RIGHT/LEFT HEART CATH AND CORONARY ANGIOGRAPHY   Conclusion      Prox RCA lesion is 40% stenosed.   Dist RCA lesion is 75% stenosed.   RPDA lesion is 80% stenosed.   1st RPL lesion is 80% stenosed.   1st Mrg lesion is 90% stenosed.   1st Diag lesion is 95% stenosed.   Prox LAD to Mid LAD lesion is 90% stenosed.   Mid LAD to Dist LAD lesion is 90% stenosed.   Dist LAD lesion is 90% stenosed.   LV end diastolic pressure is normal.   1.  Patent left main with no significant stenosis 2.  Severe diffuse LAD stenosis with predominant disease in the mid and distal vessel 3.  Severe first diagonal stenosis 4.  Severe diffuse first OM stenosis 5.  Severe RCA and PDA/PLA stenoses 6.   Normal LVEDP 7.  Right heart catheterization data: Preserved cardiac output and index of 5.6 and 2.6, respectively RA mean 7 mmHg RV 39/10 mmHg PA 36/13 mean 24 mmHg Pulmonary capillary wedge pressure mean 15 mmHg   Recommendations: Resume IV heparin  post catheterization.  Cardiac surgical consultation for consideration of CABG.  Targets are marginal and if turndown for surgery we will treat with aggressive medical therapy.  Not a candidate for PCI due to severe multivessel diffuse disease.   Indications  Non-ST elevation (NSTEMI) myocardial infarction (HCC) [I21.4 (ICD-10-CM)]    Administrations occurring from 0734 to 0825 on 07/27/23:  Medication Name Total Dose  iohexol  (OMNIPAQUE ) 350 MG/ML injection 50 mL   Radiation/Fluoro  Fluoro time: 3.7 (min) DAP: 25.5 (Gycm2) Cumulative Air Kerma: 519.6 (mGy) Complications  Complications documented before study signed (07/27/2023  8:42 AM)   No complications were associated with this study.  Documented by Ane Banter, Lanney Pitts - 07/27/2023  8:26 AM     Coronary Findings  Diagnostic Dominance: Right Left Main  There is mild diffuse disease throughout the vessel.    Left Anterior Descending  Prox LAD to Mid LAD lesion is 90% stenosed.  Mid LAD to Dist LAD lesion is 90% stenosed.  Dist LAD lesion is 90% stenosed.    First Diagonal Branch  1st Diag lesion is 95% stenosed.    Left Circumflex    First Obtuse Marginal Branch  1st Mrg lesion is 90% stenosed.    Right Coronary Artery  Prox RCA lesion is 40% stenosed.  Dist RCA lesion is 75% stenosed.    Right Posterior Descending Artery  RPDA lesion is 80% stenosed.    First Right Posterolateral Branch  1st RPL lesion is 80% stenosed.    Intervention   No interventions have been documented.   Left Heart  Left Ventricle LV end diastolic pressure is normal.   Coronary Diagrams  Diagnostic Dominance: Right  ECHOCARDIOGRAM REPORT     Patient Name:    [name]. Date of Exam: 07/25/2023  Medical Rec #:  [id number]           Height:       74.0 in  Accession #:    [id number]  Weight:       265.0 lb  Date of Birth:  08-Jun-1950            BSA:          2.450 m  Patient Age:    72 years            BP:           128/58 mmHg  Patient Gender: M                   HR:           66 bpm.  Exam Location:  Inpatient   Procedure: 2D Echo, Cardiac Doppler and Color Doppler (Both Spectral and  Color            Flow Doppler were utilized during procedure).   Indications:     Acutre respiratory distress    History:         Patient has prior history of Echocardiogram examinations,  most                  recent 09/08/2019. Risk Factors:Hypertension.    Sonographer:     Janette Medley  Referring Phys:  Homer Lust  Diagnosing Phys: Maudine Sos MD     Sonographer Comments: Patient is obese. Image acquisition challenging due  to patient body habitus.  IMPRESSIONS     1. Left ventricular ejection fraction, by estimation, is 55 to 60%. The  left ventricle has normal function. The left ventricle demonstrates  regional wall motion abnormalities (see scoring diagram/findings for  description). There is moderate left  ventricular hypertrophy. Left ventricular diastolic parameters are  consistent with Grade II diastolic dysfunction (pseudonormalization).  Elevated left ventricular end-diastolic pressure.   2. Right ventricular systolic function is normal. The right ventricular  size is normal. There is normal pulmonary artery systolic pressure.   3. Left atrial size was mildly dilated.   4. The mitral valve is normal in structure. No evidence of mitral valve  regurgitation. No evidence of mitral stenosis. Severe mitral annular  calcification.   5. The aortic valve is tricuspid. There is moderate calcification of the  aortic valve. There is moderate thickening of the aortic valve. Aortic  valve regurgitation is not visualized.  Aortic valve  sclerosis/calcification is present, without any evidence  of aortic stenosis. Aortic valve area, by VTI measures 2.42 cm. Aortic  valve mean gradient measures 6.0 mmHg. Aortic valve Vmax measures 1.76  m/s.   6. The inferior vena cava is normal in size with greater than 50%  respiratory variability, suggesting right atrial pressure of 3 mmHg.   FINDINGS   Left Ventricle: Left ventricular ejection fraction, by estimation, is 55  to 60%. The left ventricle has normal function. The left ventricle  demonstrates regional wall motion abnormalities. The left ventricular  internal cavity size was normal in size.  There is moderate left ventricular hypertrophy. Left ventricular diastolic  parameters are consistent with Grade II diastolic dysfunction  (pseudonormalization). Elevated left ventricular end-diastolic pressure.     LV Wall Scoring:  The apical septal segment, apical anterior segment, and apex are  hypokinetic.  The anterior wall, entire lateral wall, anterior septum, entire inferior  wall, mid inferoseptal segment, and basal inferoseptal segment are normal.   Right Ventricle: The right ventricular size is normal. No increase in  right ventricular wall thickness. Right ventricular systolic function is  normal. There is normal pulmonary artery systolic pressure. The tricuspid  regurgitant  velocity is 1.57 m/s, and   with an assumed right atrial pressure of 3 mmHg, the estimated right  ventricular systolic pressure is 12.9 mmHg.   Left Atrium: Left atrial size was mildly dilated.   Right Atrium: Right atrial size was normal in size.   Pericardium: There is no evidence of pericardial effusion.   Mitral Valve: The mitral valve is normal in structure. Severe mitral  annular calcification. No evidence of mitral valve regurgitation. No  evidence of mitral valve stenosis.   Tricuspid Valve: The tricuspid valve is normal in structure. Tricuspid  valve regurgitation is  trivial. No evidence of tricuspid stenosis.   Aortic Valve: The aortic valve is tricuspid. There is moderate  calcification of the aortic valve. There is moderate thickening of the  aortic valve. Aortic valve regurgitation is not visualized. Aortic valve  sclerosis/calcification is present, without any   evidence of aortic stenosis. Aortic valve mean gradient measures 6.0  mmHg. Aortic valve peak gradient measures 12.3 mmHg. Aortic valve area, by  VTI measures 2.42 cm.   Pulmonic Valve: The pulmonic valve was normal in structure. Pulmonic valve  regurgitation is not visualized. No evidence of pulmonic stenosis.   Aorta: The aortic root is normal in size and structure.   Venous: The inferior vena cava is normal in size with greater than 50%  respiratory variability, suggesting right atrial pressure of 3 mmHg.   IAS/Shunts: No atrial level shunt detected by color flow Doppler.     LEFT VENTRICLE  PLAX 2D  LVIDd:         4.90 cm   Diastology  LVIDs:         3.20 cm   LV e' medial:    6.09 cm/s  LV PW:         1.50 cm   LV E/e' medial:  24.3  LV IVS:        1.20 cm   LV e' lateral:   5.00 cm/s  LVOT diam:     2.10 cm   LV E/e' lateral: 29.6  LV SV:         87  LV SV Index:   35  LVOT Area:     3.46 cm     RIGHT VENTRICLE             IVC  RV S prime:     10.60 cm/s  IVC diam: 2.00 cm  TAPSE (M-mode): 2.2 cm   LEFT ATRIUM             Index        RIGHT ATRIUM           Index  LA diam:        5.00 cm 2.04 cm/m   RA Area:     18.80 cm  LA Vol (A2C):   86.2 ml 35.19 ml/m  RA Volume:   51.40 ml  20.98 ml/m  LA Vol (A4C):   65.0 ml 26.54 ml/m  LA Biplane Vol: 76.9 ml 31.39 ml/m   AORTIC VALVE  AV Area (Vmax):    2.12 cm  AV Area (Vmean):   2.18 cm  AV Area (VTI):     2.42 cm  AV Vmax:           175.50 cm/s  AV Vmean:          113.500 cm/s  AV VTI:            0.358 m  AV Peak Grad:  12.3 mmHg  AV Mean Grad:      6.0 mmHg  LVOT Vmax:         107.50 cm/s  LVOT  Vmean:        71.550 cm/s  LVOT VTI:          0.250 m  LVOT/AV VTI ratio: 0.70    AORTA  Ao Root diam: 2.70 cm   MITRAL VALVE                TRICUSPID VALVE  MV Area (PHT): 2.99 cm     TR Peak grad:   9.9 mmHg  MV Decel Time: 254 msec     TR Vmax:        157.00 cm/s  MV E velocity: 148.00 cm/s  MV A velocity: 139.00 cm/s  SHUNTS  MV E/A ratio:  1.06         Systemic VTI:  0.25 m                              Systemic Diam: 2.10 cm   Maudine Sos MD  Electronically signed by Maudine Sos MD  Signature Date/Time: 07/25/2023/3:54:16 PM      Recent Radiology Findings:  CLINICAL DATA:  Shortness of breath.   EXAM: PORTABLE CHEST 1 VIEW   COMPARISON:  Chest radiograph dated 09/07/2019.   FINDINGS: Diffuse interstitial densities as well as diffuse airspace density throughout the left lung may represent asymmetric edema, pneumonia, or combination. Clinical correlation recommended. No pleural effusion pneumothorax. The cardiac silhouette is within normal limits. No acute osseous pathology   IMPRESSION: Asymmetric edema, pneumonia, or combination.     Electronically Signed   By: Angus Bark M.D.   On: 07/24/2023 14:32 I have independently reviewed the above radiologic studies and discussed with the patient   Recent Lab Findings: Lab Results  Component Value Date   WBC 10.9 (H) 07/26/2023   HGB 16.3 07/26/2023   HCT 48.0 07/26/2023   PLT 260 07/26/2023   GLUCOSE 141 (H) 07/26/2023   CHOL 214 (H) 07/25/2023   TRIG 79 07/25/2023   HDL 44 07/25/2023   LDLCALC 154 (H) 07/25/2023   ALT 19 07/24/2023   AST 26 07/24/2023   NA 130 (L) 07/26/2023   K 4.1 07/26/2023   CL 92 (L) 07/26/2023   CREATININE 1.01 07/26/2023   BUN 25 (H) 07/26/2023   CO2 31 07/26/2023   TSH 0.806 07/25/2023   INR 0.98 01/09/2018   HGBA1C 6.2 (H) 07/24/2023      Assessment / Plan:      - Severe multivessel coronary artery disease in a 73 year old male with a past history of  heart failure with preserved ejection fraction now presenting with acute non-ST elevation myocardial infarction and regional wall motion abnormality in the anterior wall.  He is currently pain-free at rest.  He has been diuresed with good response and is currently free of angina and shortness of breath.  The coronary arteries are diffusely diseased with a relatively small LAD and circumflex.  Dr. Luna Salinas will review and determine suitability for bypass grafting.  Further discussion will follow.  -Paroxysmal atrial fibrillation: Currently in sinus rhythm.  To resume Eliquis following catheterization.  -Dyslipidemia: Intolerant of statin therapy.  Cardiology addressing lipid management.   -Type 2 diabetes mellitus: Managed with insulin .  - Mild mitral insufficiency: Calcified mitral valve annulus   I  spent 25 minutes counseling the patient face  to face.  Leata Providence, PA-C  07/27/2023 10:28 AM  I have seen and examined [name].  I reviewed his records and catheterization images.  73 year old man with a history of obesity, hypertension, dyslipidemia, tobacco abuse, DVT, and a single episode of paroxysmal atrial fibrillation.  Presented with acute onset of severe shortness of breath.  Found to be hypoxic requiring BiPAP.  Chest x-ray showed asymmetric edema versus pneumonia.  Troponin greater than 7000.  He was admitted and diuresed.  Catheterization he was found to have severe three-vessel coronary disease with diffusely diseased targets particularly his LAD.  Currently pain-free.  Although he has diffusely diseased suboptimal targets, I do believe he is a candidate for CABG.  He understands he is at higher risk for early or late graft failure and recurrent angina.  Still, I believe that is his best option and provides the best chance of long-term survival.  I informed him of the general nature of the procedure.  The plan would be to do coronary bypass grafting and a left atrial  appendage clip.  He understands the need for general anesthesia, the incisions to be used, the use of cardiopulmonary bypass, the use of temporary pacemaker wires and drainage tubes postoperatively, the expected hospital stay, and the overall recovery.  I informed him of the indications, risks, benefits, and alternatives.  He understands the risks include, but not limited to death, MI, DVT, PE, bleeding, possible need for transfusion, infection, cardiac arrhythmias, respiratory or renal failure, early or late graft failure, as well as possibility of other unforeseeable complications.  He accepts the risk and wishes to proceed.  Tentatively plan for coronary bypass grafting second case on Monday, 07/30/2023

## 2023-07-27 NOTE — Progress Notes (Signed)
 Daily Progress Note Intern Pager: [phone]  Patient name: [name]. Medical record number: [id number] Date of birth: 06/13/1950 Age: 73 y.o. Gender: male  Primary Care Provider: [facility], [facility] Consultants: Cardiology, CT Surgery Code Status: FULL  Pt Overview and Major Events to Date:  5/27 - Admitted, started diuresis with IV Lasix  5/28 - Cardiology consulted, TTE with LV RWMAb, Troponin 7K, loaded ASA & started heparin gtt 5/30 - L/R HC showing severe multivessel disease, CT surgery consulted for CABG eval   Assessment and Plan: Dantre Yearwood. is a 73 y.o. male with a pertinent PMH of T2DM, A-fib on Eliquis, HTN, HLD, RBBB, and OSA on CPAP who presented with AHRF and was found to have NSTEMI, now s/p L/R HC which showed significant LAD stenosis. Assessment & Plan NSTEMI (non-ST elevated myocardial infarction) (HCC)  Cardiomyopathy TTE with regional LV WMAb.  Troponins elevated and slightly downtrending.  Suspect cardiomyopathy and will follow Cardiology lead.  AHRF on admission could have been flash pulmonary edema 2/2 cardiomyopathy or ACS, now stable on room air. 24 hour UOP of 2.1 L, net negative 4.1 L this admission. Weight stable at 119.7 kg.  K stable and Mag replenished.  Creatinine stable. - Strict I&Os, daily weights, and heart healthy diet - Continuous cardiac monitoring and pulse oximetry - PT/OT eval and treat - AM BMP and Mag, Mag; goal K>4, Mag>2 - Cardiology consulted, appreciate recommendations:  -Heparin gtt and transition to Eliquis  -Loaded with ASA 324 mg, continue ASA 81 mg daily  -Continue carvedilol  25 mg BID  -Likely resume Entresto, decision pending  -Hold Jardiance until DC  -Lasix  40 mg IV BID  -LPa level  -L/R HC showing severe multivessel disease, recommend CT surgery for CABG -CT surgery consulted, follow up recommendations Alcohol use  Hyponatremia Reportedly drinks 3 beers nightly, last drink was last night.  No  history of WD seizures or alcohol WD.  CIWAs 0 for ~72 hours.  Does have mild-moderate hyponatremia, suspect 2/2 beer intake. - Discontinue CIWAs - Thiamine , folate, and MVI daily - Consult to [facility] COPD (chronic obstructive pulmonary disease) (HCC) Do not favor exacerbation as no history of cough and no wheeze on exam.  Not needing DuoNebs. Home regimen: Albuterol PRN (using a few times monthly) and Spiriva Respimat daily - DuoNebs Q6h PRN - Incruse Ellipta (Spiriva Respimat formulary equivalent) daily - O2 sat goal 88-92%, BiPAP nightly for OSA - Recommended marijuana cessation or at least discontinuing smoking inhalation T2DM (type 2 diabetes mellitus) (HCC) Last A1c on record June 2022 is 7.3, improved to 6.2 this admission.  CBGs in mid 100s, 9 units SAI in past 24 hours. Home regimen: Lantus  20 units BID, NOT taking Jardiance - CBGs ACHS (does have CGM, but per pharmacy prefer to still do CBGs) - Resistant SSI - Carb modified diet - Continue LAI 20 units daily, held today for L/R [facility] and restart tomorrow 5/30 Chronic health problem Afib: Eliquis 5 mg BID held for heparin gtt, 44% burden Afib on monitor outpatient and on longterm AC Insomnia: Continue home trazodone 100 mg QHS and hydroxyzine  25 mg BID PRN HTN: As per NSTEMI above HLD: Not on ezetimibe and statin due to intolerance; LDL 154 and may need Repatha outpatient Allergies: Fluticasone  daily, olopatadine  eye drops BID, cromolyn drops QID OSA: BiPAP per RT nightly CKD2: Recently diagnosed, Cr stable on admission  FEN/GI: Restart carb modified diet PPx: Heparin therapeutic dose for NSTEMI, then transition to Eliquis  5 mg BID Dispo: Pending PT recommendations and cardiovascular workup.  Subjective:  Saw patient after AM LR HC.  He is doing well.  Denies chest pain, dyspnea.  States he feels some anxiety about cath findings and possible surgery, but otherwise in good spirits.  Asks many questions about care.   Hungry.  Objective: Temp:  [97.8 F (36.6 C)-98.4 F (36.9 C)] 98.4 F (36.9 C) (05/30 0449) Pulse Rate:  [60-73] 60 (05/30 0520) Resp:  [18-19] 18 (05/30 0449) BP: (119-153)/(51-68) 129/64 (05/30 0520) SpO2:  [97 %-100 %] 97 % (05/30 0520) Weight:  [119.7 kg] 119.7 kg (05/30 0506)  Physical Exam: General: Age-appropriate, resting comfortably in bed, NAD, alert and at baseline. HEENT: MMM.  Unable to evaluate JVD. Cardiovascular: Regular rate and rhythm. Normal S1/S2. No murmurs, rubs, or gallops appreciated. 2+ radial pulses. Pulmonary: No crackles, CTAB. No wheezes or rhonchi. Normal WOB on room air. Abdominal: Normoactive bowel sounds, nondistended. No tenderness to deep or light palpation. No rebound or guarding. Extremities: No peripheral edema bilaterally. Capillary refill <2 seconds.  Laboratory: Most recent CBC Lab Results  Component Value Date   WBC 10.9 (H) 07/26/2023   HGB 16.3 07/26/2023   HCT 48.0 07/26/2023   MCV 88.4 07/26/2023   PLT 260 07/26/2023   Most recent BMP    Latest Ref Rng & Units 07/26/2023    7:27 AM  BMP  Glucose 70 - 99 mg/dL 914   BUN 8 - 23 mg/dL 25   Creatinine 7.82 - 1.24 mg/dL 9.56   Sodium 213 - 086 mmol/L 130   Potassium 3.5 - 5.1 mmol/L 4.1   Chloride 98 - 111 mmol/L 92   CO2 22 - 32 mmol/L 31   Calcium  8.9 - 10.3 mg/dL 8.9     Other pertinent labs: -None  New Imaging/Diagnostic Tests: -L/R HC pending  Dayle Mcnerney, MD 07/27/2023, 7:35 AM  PGY-1, [facility] FPTS Intern pager: [phone], text pages welcome Secure chat group [facility] Teaching Service

## 2023-07-27 NOTE — H&P (View-Only) (Signed)
 [address]       [name] [address]             [phone]        [name] Brooks Medical Record #[id number] Date of Birth: 02/20/1951  Referring: Arnoldo Lapping, MD  Primary Care: Administration, [facility] Cardiologist:None  Reason for consult: Evaluation for potential surgical revascularization for severe multivessel coronary artery disease.   History of Present Illness:     Mr. Mahin Guardia is a pleasant 73 year old gentleman with a past history of dyslipidemia intolerant of statins, obesity alcohol and cannabis abuse, hypertension, paroxysmal atrial fibrillation on Eliquis , remote history of DVT and former tobacco smoker having quit in 1979.  He was in [employer] on 07/24/2023 when he developed acute onset severe shortness of breath.  EMS was summoned.  He was noted to have a oxygen  saturation of 57%.  He was placed on BiPAP and brought to the emergency room.  In the ED, chest x-ray showed asymmetric pulmonary edema versus pneumonia.  Initial high-sensitivity troponin was over 7000.  EKG showed sinus tachycardia with first-degree AV block, right bundle blanch block, and anterior T wave inversions. Mr. Whitner was admitted to the hospital and diuresed aggressively with good response.  Serial high-sensitivity troponins remained elevated.  He was taken to the Cath Lab earlier today where coronary angiography demonstrated severe three-vessel coronary artery disease.  CT surgery has been asked to evaluate for consideration of coronary bypass grafting. Mr. Sainsbury is retired from his previous occupation as a Chartered certified accountant.  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 He endorses frequent chest tightness and shortness of breath with activities like walking up a hill or upstairs.  Current Activity/ Functional Status: Patient is independent with mobility/ambulation, transfers, ADL's, IADL's.   Zubrod Score: At the  time of surgery this patient's most appropriate activity status/level should be described as: []     0    Normal activity, no symptoms [x]     1    Restricted in physical strenuous activity but ambulatory, able to do out light work []     2    Ambulatory and capable of self care, unable to do work activities, up and about                 more than 50%  Of the time                            []     3    Only limited self care, in bed greater than 50% of waking hours []     4    Completely disabled, no self care, confined to bed or chair []     5    Moribund  Past Medical History:  Diagnosis Date   Allergy    Anxiety    Arthritis    Cataract    bilateral lens implants   Clotting disorder (HCC)    DVT in 2006 x1; no problems since   Colon stricture - diverticular (HCC) 09/07/2019   Diabetes mellitus    2   Diverticulitis    Diverticulosis    DVT (deep venous thrombosis) (HCC) 2007   left leg   Hepatitis 1973   treated at [facility]   HLD (hyperlipidemia)    Hypertension    Neuromuscular disorder (HCC)    neuropathy   Peripheral vascular disease (HCC) 2006  dvt 's   Pneumonia 2006   RBBB    Sleep apnea    Subdural hematoma [facility])     Past Surgical History:  Procedure Laterality Date   BRAIN SURGERY  2010   subdural hematoma -bleed6 months after heart cath   CATARACT EXTRACTION, BILATERAL  12/16, 2017   cataract   CHOLECYSTECTOMY N/A 09/08/2020   Procedure: LAPAROSCOPIC CHOLECYSTECTOMY WITH INTRAOPERATIVE CHOLANGIOGRAM;  Surgeon: Dareen Ebbing, MD;  Location: [facility] ORS;  Service: General;  Laterality: N/A;   COLONOSCOPY  2014   CYSTOSCOPY WITH INSERTION OF UROLIFT     CYSTOSCOPY WITH INSERTION OF UROLIFT     DG THUMB LEFT HAND Left    "gamers "   ROTATOR CUFF REPAIR W/ DISTAL CLAVICLE EXCISION Left 05/20/2015   SHOULDER ARTHROSCOPY WITH SUBACROMIAL DECOMPRESSION, ROTATOR CUFF REPAIR AND BICEP TENDON REPAIR Left 05/20/2015   Procedure: LEFT SHOULDER ARTHROSCOPY WITH SUBACROMIAL  DECOMPRESSION,DISTAL CLAVICAL RESECTION  ROTATOR CUFF REPAIR ;  Surgeon: Ellard Gunning, MD;  Location: [facility] OR;  Service: Orthopedics;  Laterality: Left;   TONSILLECTOMY      Social History   Tobacco Use  Smoking Status Former   Current packs/day: 0.00   Average packs/day: 1 pack/day for 15.0 years (15.0 ttl pk-yrs)   Types: Cigarettes   Start date: 1964   Quit date: 1979   Years since quitting: 46.4  Smokeless Tobacco Never    Social History   Substance and Sexual Activity  Alcohol Use Yes   Alcohol/week: 10.0 standard drinks of alcohol   Types: 10 Cans of beer per week   Comment: 3 beers per day     Allergies  Allergen Reactions   Ace Inhibitors Other (See Comments)    Angioedema per pt report to admiting MD On 07/31/2019 - lisinopril    Gabapentin Swelling and Other (See Comments)     Influenza-like illness   Peanut Oil Shortness Of Breath    Swelling throat, sob  Other reaction(s): anaphylaxis   Tamiflu [Oseltamivir Phosphate] Shortness Of Breath   Losartan Swelling    Other reaction(s): Lip swelling   Lyrica [Pregabalin] Swelling   Oxcarbazepine Swelling and Other (See Comments)    Lip swelling   Statins Other (See Comments)    Myalgia -- atorvastatin, simvastatin   Avelox [Moxifloxacin Hcl In Nacl] Rash   Ciprofloxacin  Anxiety   Citalopram Anxiety    Other reaction(s): Drowsy, Anxiety   Victoza [Liraglutide] Other (See Comments)    [facility]    Current Facility-Administered Medications  Medication Dose Route Frequency Provider Last Rate Last Admin   0.9 %  sodium chloride  infusion   Intravenous Continuous Adams, Zane, PA-C       [MAR Hold] acetaminophen  (TYLENOL ) tablet 1,000 mg  1,000 mg Oral Q6H PRN Miller, Samantha, DO   1,000 mg at 07/27/23 0235   Or   [MAR Hold] acetaminophen  (TYLENOL ) suppository 650 mg  650 mg Rectal Q6H PRN Miller, Samantha, DO       aspirin  chewable tablet 81 mg  81 mg Oral Pre-Cath Adams, Zane, PA-C       [MAR Hold]  aspirin  EC tablet 81 mg  81 mg Oral Daily Adams, Zane, PA-C   81 mg at 07/26/23 0848   [MAR Hold] carvedilol  (COREG ) tablet 25 mg  25 mg Oral BID Shitarev, Dimitry, MD   25 mg at 07/26/23 2132   [MAR Hold] cromolyn (OPTICROM) 4 % ophthalmic solution 1 drop  1 drop Both Eyes QID PRN Shitarev, Dimitry, MD  fentaNYL  (SUBLIMAZE ) injection    PRN Cooper, Michael, MD   25 mcg at 07/27/23 0750   [MAR Hold] fluticasone  (FLONASE ) 50 MCG/ACT nasal spray 2 spray  2 spray Each Nare Daily Shitarev, Dimitry, MD   2 spray at 07/26/23 0851   [MAR Hold] folic acid  (FOLVITE ) tablet 1 mg  1 mg Oral Daily Shitarev, Dimitry, MD   1 mg at 07/26/23 0851   [MAR Hold] furosemide  (LASIX ) injection 40 mg  40 mg Intravenous BID Adams, Zane, PA-C   40 mg at 07/26/23 1730   Heparin  (Porcine) in NaCl 1000-0.9 UT/500ML-% SOLN    PRN Arnoldo Lapping, MD   500 mL at 07/27/23 0741   heparin  ADULT infusion 100 units/mL (25000 units/250mL)  1,750 Units/hr Intravenous Continuous McDiarmid, Demetra Filter, MD   Stopped at 07/27/23 0730   heparin  sodium (porcine) injection    PRN Arnoldo Lapping, MD   6,000 Units at 07/27/23 0803   [MAR Hold] hydrOXYzine  (ATARAX ) tablet 25 mg  25 mg Oral BID PRN Shitarev, Dimitry, MD   25 mg at 07/26/23 2132   [facility] Hold] insulin  aspart (novoLOG ) injection 0-20 Units  0-20 Units Subcutaneous TID WC Omar Bibber, DO   4 Units at 07/26/23 1730   [MAR Hold] insulin  aspart (novoLOG ) injection 0-5 Units  0-5 Units Subcutaneous QHS Rayma Calandra, DO   3 Units at 07/25/23 2150   iohexol  (OMNIPAQUE ) 350 MG/ML injection    PRN Arnoldo Lapping, MD   50 mL at 07/27/23 0821   [MAR Hold] ipratropium-albuterol  (DUONEB) 0.5-2.5 (3) MG/3ML nebulizer solution 3 mL  3 mL Nebulization Q6H PRN Shitarev, Dimitry, MD       lidocaine  (PF) (XYLOCAINE ) 1 % injection    PRN Arnoldo Lapping, MD   2 mL at 07/27/23 0753   midazolam  (VERSED ) injection    PRN Cooper, Michael, MD   2 mg at 07/27/23 0750   [MAR Hold] multivitamin with  minerals tablet 1 tablet  1 tablet Oral Daily Shitarev, Dimitry, MD   1 tablet at 07/26/23 0848   [MAR Hold] olopatadine  (PATANOL) 0.1 % ophthalmic solution 1 drop  1 drop Both Eyes BID Shitarev, Dimitry, MD   1 drop at 07/26/23 2133   [facility] Hold] pantoprazole  (PROTONIX ) EC tablet 40 mg  40 mg Oral Daily Adams, Zane, PA-C   40 mg at 07/26/23 0848   [MAR Hold] polyethylene glycol (MIRALAX  / GLYCOLAX ) packet 17 g  17 g Oral Daily PRN Shitarev, Dimitry, MD   17 g at 07/26/23 0924   Radial Cocktail/Verapamil  only    PRN Arnoldo Lapping, MD   10 mL at 07/27/23 0755   [MAR Hold] sodium chloride  flush (NS) 0.9 % injection 3 mL  3 mL Intravenous Q12H Shitarev, Dimitry, MD   3 mL at 07/26/23 2133   [MAR Hold] terazosin  (HYTRIN ) capsule 2 mg  2 mg Oral QHS Shitarev, Dimitry, MD   2 mg at 07/26/23 2132   [facility] Hold] thiamine  (VITAMIN B1) tablet 100 mg  100 mg Oral Daily Shitarev, Dimitry, MD   100 mg at 07/26/23 0848   Or   [MAR Hold] thiamine  (VITAMIN B1) injection 100 mg  100 mg Intravenous Daily Shitarev, Dimitry, MD       [MAR Hold] traZODone  (DESYREL ) tablet 100 mg  100 mg Oral QHS Shitarev, Dimitry, MD   100 mg at 07/26/23 2147   [MAR Hold] umeclidinium bromide  (INCRUSE ELLIPTA ) 62.5 MCG/ACT 1 puff  1 puff Inhalation Daily Shitarev, Dimitry, MD   1  puff at 07/26/23 0902    Medications Prior to Admission  Medication Sig Dispense Refill Last Dose/Taking   apixaban  (ELIQUIS ) 5 MG TABS tablet Take 5 mg by mouth 2 (two) times daily.   07/24/2023 at  5:00 AM   Azelastine HCl 137 MCG/SPRAY SOLN Place 1 spray into both nostrils 2 (two) times daily.    07/24/2023 Morning   carvedilol  (COREG ) 25 MG tablet Take 25 mg by mouth 2 (two) times daily.   07/24/2023 Morning   Cholecalciferol (VITAMIN D) 50 MCG (2000 UT) CAPS Take 1 capsule by mouth every evening.   07/23/2023 Evening   Coenzyme Q10 (COQ10) 100 MG CAPS Take 100 mg by mouth daily.   07/24/2023 Morning   fluticasone  (FLONASE ) 50 MCG/ACT nasal spray Place 2  sprays into both nostrils 2 (two) times daily.   07/24/2023 Morning   furosemide  (LASIX ) 20 MG tablet Take 20 mg by mouth daily.   07/24/2023 Morning   Glucosamine-Chondroit-Vit C-Mn (GLUCOSAMINE 1500 COMPLEX PO) Take 1 tablet by mouth every evening.   07/23/2023 Evening   hydrOXYzine  (VISTARIL ) 25 MG capsule Take 25 mg by mouth 2 (two) times daily as needed (for sleep per patient).   07/23/2023 Evening   insulin  aspart (NOVOLOG ) 100 UNIT/ML injection Inject 0-8 Units into the skin 3 (three) times daily as needed for high blood sugar.    07/23/2023 Evening   insulin  glargine (LANTUS ) 100 UNIT/ML injection Inject 20 Units into the skin 2 (two) times daily.   07/24/2023 Morning   MAGNESIUM  OXIDE PO Take 500 mg by mouth at bedtime.   07/23/2023 Evening   Multiple Vitamin (MULTIVITAMIN WITH MINERALS) TABS Take 1 tablet by mouth every evening.    07/23/2023 Evening   neomycin-polymyxin-hydrocortisone (CORTISPORIN) OTIC solution Place 4 drops into the right ear 4 (four) times daily as needed (itchy ears).   Past Week   olopatadine  (PATANOL) 0.1 % ophthalmic solution Place 1 drop into both eyes 2 (two) times daily.   07/24/2023 Morning   polyethylene glycol powder (GLYCOLAX /MIRALAX ) 17 GM/SCOOP powder Take 34 g by mouth at bedtime.   Past Week   potassium gluconate 595 MG TABS Take 595 mg by mouth every evening.    07/23/2023 Evening   Probiotic Product (PROBIOTIC DAILY PO) Take 2 capsules by mouth every evening.   07/23/2023 Evening   sacubitril-valsartan (ENTRESTO) 97-103 MG Take 1 tablet by mouth 2 (two) times daily.   07/24/2023 Morning   terazosin  (HYTRIN ) 2 MG capsule Take 2 mg by mouth at bedtime.   07/23/2023 Bedtime   Tiotropium Bromide Monohydrate (SPIRIVA RESPIMAT) 2.5 MCG/ACT AERS Inhale 2 puffs into the lungs daily.   Taking   traZODone  (DESYREL ) 100 MG tablet Take 100 mg by mouth at bedtime.   07/23/2023 Bedtime   TURMERIC PO Take 1 tablet by mouth every evening. With Ginger   07/23/2023 Evening   Vitamin  A 2400 MCG (8000 UT) CAPS Take 8,000 Units by mouth every evening.   07/23/2023 Evening   vitamin B-12 (CYANOCOBALAMIN) 500 MCG tablet Take 500 mcg by mouth every evening.   07/23/2023 Evening   cromolyn  (OPTICROM ) 4 % ophthalmic solution Place 1 drop into both eyes 4 (four) times daily as needed (itchy eyes).   Unknown   OVER THE COUNTER MEDICATION Take 1 capsule by mouth at bedtime. Oil of Oregano      tadalafil (CIALIS) 20 MG tablet Take 20 mg by mouth daily as needed for erectile dysfunction.   Unknown    Family  History  Problem Relation Age of Onset   Hypertension Mother    Alzheimer's disease Mother    Stroke Father    Heart attack Maternal Grandfather 50   Cancer Paternal Grandfather        type unknown   Diabetes Brother    Colon polyps Neg Hx    Colon cancer Neg Hx    Liver cancer Neg Hx    Stomach cancer Neg Hx    Rectal cancer Neg Hx      Review of Systems:       Cardiac Review of Systems: Y or  [    ]= no  Chest Pain [ x   ]  Resting SOB [   ] Exertional SOB  [ x ]  Orthopnea [  ]   Pedal Edema [  x ]    Palpitations [  ] Syncope  [  ]   Presyncope [   ]  General Review of Systems: [Y] = yes [  ]=no Constitional: recent weight change [  ]; anorexia [  ]; fatigue [  ]; nausea [  ]; night sweats [  ]; fever [  ]; or chills [  ]                                                               Dental: Last Dentist visit: 6 months  Eye : blurred vision [  ]; diplopia [   ]; vision changes [  ];  Amaurosis fugax[  ]; Resp: cough [  ];  wheezing[  ];  hemoptysis[  ]; shortness of breath[x  ]; paroxysmal nocturnal dyspnea[  ]; dyspnea on exertion[ x ]; or orthopnea[  ];  GI:  gallstones[  ], vomiting[  ];  dysphagia[  ]; melena[  ];  hematochezia [  ]; heartburn[  ];   Hx of  Colonoscopy[  ]; GU: kidney stones [  ]; hematuria[  ];   dysuria [  ];  nocturia[  ];  history of     obstruction [  ]; urinary frequency [  ]             Skin: rash, swelling[  ];, hair loss[  ];   peripheral edema[ x ];  or itching[  ]; Musculosketetal: myalgias[  ];  joint swelling[  ];  joint erythema[  ];  joint pain[  ];  back pain[  ];  Heme/Lymph: bruising[  ];  bleeding[  ];  anemia[  ];  Neuro: TIA[  ];  headaches[  ];  stroke[  ];  vertigo[  ];  seizures[  ];   paresthesias[  ];  difficulty walking[  ];  Psych:depression[  ]; anxiety[  ];  Endocrine: diabetes[ x ];  thyroid dysfunction[  ];              Physical Exam: BP (!) 127/47   Pulse 63   Temp 98.4 F (36.9 C) (Oral)   Resp 16   Ht 6\' 2"  (1.88 m)   Wt 119.7 kg   SpO2 94%   BMI 33.90 kg/m    General appearance: alert, cooperative, and no distress Head: Normocephalic, without obvious abnormality, atraumatic Neck: no adenopathy, no carotid bruit, no JVD, and supple, symmetrical, trachea midline Lymph nodes:  No cervical or clavicular adenopathy Resp: Breath sounds full, equal, and clear to auscultation Cardio: Regular rate and rhythm, soft systolic murmur heard at the right sternal border GI: Obese, soft, nontender.  Active bowel sounds Extremities: No deformities.  All distal pulses are palpable.  He has venous stasis changes in both lower extremities Neurologic: Grossly normal  Diagnostic Studies & Laboratory data:   RIGHT/LEFT HEART CATH AND CORONARY ANGIOGRAPHY   Conclusion      Prox RCA lesion is 40% stenosed.   Dist RCA lesion is 75% stenosed.   RPDA lesion is 80% stenosed.   1st RPL lesion is 80% stenosed.   1st Mrg lesion is 90% stenosed.   1st Diag lesion is 95% stenosed.   Prox LAD to Mid LAD lesion is 90% stenosed.   Mid LAD to Dist LAD lesion is 90% stenosed.   Dist LAD lesion is 90% stenosed.   LV end diastolic pressure is normal.   1.  Patent left main with no significant stenosis 2.  Severe diffuse LAD stenosis with predominant disease in the mid and distal vessel 3.  Severe first diagonal stenosis 4.  Severe diffuse first OM stenosis 5.  Severe RCA and PDA/PLA stenoses 6.   Normal LVEDP 7.  Right heart catheterization data: Preserved cardiac output and index of 5.6 and 2.6, respectively RA mean 7 mmHg RV 39/10 mmHg PA 36/13 mean 24 mmHg Pulmonary capillary wedge pressure mean 15 mmHg   Recommendations: Resume IV heparin  post catheterization.  Cardiac surgical consultation for consideration of CABG.  Targets are marginal and if turndown for surgery we will treat with aggressive medical therapy.  Not a candidate for PCI due to severe multivessel diffuse disease.   Indications  Non-ST elevation (NSTEMI) myocardial infarction (HCC) [I21.4 (ICD-10-CM)]    Administrations occurring from 0734 to 0825 on 07/27/23:  Medication Name Total Dose  iohexol  (OMNIPAQUE ) 350 MG/ML injection 50 mL   Radiation/Fluoro  Fluoro time: 3.7 (min) DAP: 25.5 (Gycm2) Cumulative Air Kerma: 519.6 (mGy) Complications  Complications documented before study signed (07/27/2023  8:42 AM)   No complications were associated with this study.  Documented by Ane Banter, Lanney Pitts - 07/27/2023  8:26 AM     Coronary Findings  Diagnostic Dominance: Right Left Main  There is mild diffuse disease throughout the vessel.    Left Anterior Descending  Prox LAD to Mid LAD lesion is 90% stenosed.  Mid LAD to Dist LAD lesion is 90% stenosed.  Dist LAD lesion is 90% stenosed.    First Diagonal Branch  1st Diag lesion is 95% stenosed.    Left Circumflex    First Obtuse Marginal Branch  1st Mrg lesion is 90% stenosed.    Right Coronary Artery  Prox RCA lesion is 40% stenosed.  Dist RCA lesion is 75% stenosed.    Right Posterior Descending Artery  RPDA lesion is 80% stenosed.    First Right Posterolateral Branch  1st RPL lesion is 80% stenosed.    Intervention   No interventions have been documented.   Left Heart  Left Ventricle LV end diastolic pressure is normal.   Coronary Diagrams  Diagnostic Dominance: Right  ECHOCARDIOGRAM REPORT     Patient Name:    Tullio Chausse. Date of Exam: 07/25/2023  Medical Rec #:  [id number]           Height:       74.0 in  Accession #:    [id number]  Weight:       265.0 lb  Date of Birth:  07-11-1950            BSA:          2.450 m  Patient Age:    72 years            BP:           128/58 mmHg  Patient Gender: M                   HR:           66 bpm.  Exam Location:  Inpatient   Procedure: 2D Echo, Cardiac Doppler and Color Doppler (Both Spectral and  Color            Flow Doppler were utilized during procedure).   Indications:     Acutre respiratory distress    History:         Patient has prior history of Echocardiogram examinations,  most                  recent 09/08/2019. Risk Factors:Hypertension.    Sonographer:     Janette Medley  Referring Phys:  Homer Lust  Diagnosing Phys: Maudine Sos MD     Sonographer Comments: Patient is obese. Image acquisition challenging due  to patient body habitus.  IMPRESSIONS     1. Left ventricular ejection fraction, by estimation, is 55 to 60%. The  left ventricle has normal function. The left ventricle demonstrates  regional wall motion abnormalities (see scoring diagram/findings for  description). There is moderate left  ventricular hypertrophy. Left ventricular diastolic parameters are  consistent with Grade II diastolic dysfunction (pseudonormalization).  Elevated left ventricular end-diastolic pressure.   2. Right ventricular systolic function is normal. The right ventricular  size is normal. There is normal pulmonary artery systolic pressure.   3. Left atrial size was mildly dilated.   4. The mitral valve is normal in structure. No evidence of mitral valve  regurgitation. No evidence of mitral stenosis. Severe mitral annular  calcification.   5. The aortic valve is tricuspid. There is moderate calcification of the  aortic valve. There is moderate thickening of the aortic valve. Aortic  valve regurgitation is not visualized.  Aortic valve  sclerosis/calcification is present, without any evidence  of aortic stenosis. Aortic valve area, by VTI measures 2.42 cm. Aortic  valve mean gradient measures 6.0 mmHg. Aortic valve Vmax measures 1.76  m/s.   6. The inferior vena cava is normal in size with greater than 50%  respiratory variability, suggesting right atrial pressure of 3 mmHg.   FINDINGS   Left Ventricle: Left ventricular ejection fraction, by estimation, is 55  to 60%. The left ventricle has normal function. The left ventricle  demonstrates regional wall motion abnormalities. The left ventricular  internal cavity size was normal in size.  There is moderate left ventricular hypertrophy. Left ventricular diastolic  parameters are consistent with Grade II diastolic dysfunction  (pseudonormalization). Elevated left ventricular end-diastolic pressure.     LV Wall Scoring:  The apical septal segment, apical anterior segment, and apex are  hypokinetic.  The anterior wall, entire lateral wall, anterior septum, entire inferior  wall, mid inferoseptal segment, and basal inferoseptal segment are normal.   Right Ventricle: The right ventricular size is normal. No increase in  right ventricular wall thickness. Right ventricular systolic function is  normal. There is normal pulmonary artery systolic pressure. The tricuspid  regurgitant  velocity is 1.57 m/s, and   with an assumed right atrial pressure of 3 mmHg, the estimated right  ventricular systolic pressure is 12.9 mmHg.   Left Atrium: Left atrial size was mildly dilated.   Right Atrium: Right atrial size was normal in size.   Pericardium: There is no evidence of pericardial effusion.   Mitral Valve: The mitral valve is normal in structure. Severe mitral  annular calcification. No evidence of mitral valve regurgitation. No  evidence of mitral valve stenosis.   Tricuspid Valve: The tricuspid valve is normal in structure. Tricuspid  valve regurgitation is  trivial. No evidence of tricuspid stenosis.   Aortic Valve: The aortic valve is tricuspid. There is moderate  calcification of the aortic valve. There is moderate thickening of the  aortic valve. Aortic valve regurgitation is not visualized. Aortic valve  sclerosis/calcification is present, without any   evidence of aortic stenosis. Aortic valve mean gradient measures 6.0  mmHg. Aortic valve peak gradient measures 12.3 mmHg. Aortic valve area, by  VTI measures 2.42 cm.   Pulmonic Valve: The pulmonic valve was normal in structure. Pulmonic valve  regurgitation is not visualized. No evidence of pulmonic stenosis.   Aorta: The aortic root is normal in size and structure.   Venous: The inferior vena cava is normal in size with greater than 50%  respiratory variability, suggesting right atrial pressure of 3 mmHg.   IAS/Shunts: No atrial level shunt detected by color flow Doppler.     LEFT VENTRICLE  PLAX 2D  LVIDd:         4.90 cm   Diastology  LVIDs:         3.20 cm   LV e' medial:    6.09 cm/s  LV PW:         1.50 cm   LV E/e' medial:  24.3  LV IVS:        1.20 cm   LV e' lateral:   5.00 cm/s  LVOT diam:     2.10 cm   LV E/e' lateral: 29.6  LV SV:         87  LV SV Index:   35  LVOT Area:     3.46 cm     RIGHT VENTRICLE             IVC  RV S prime:     10.60 cm/s  IVC diam: 2.00 cm  TAPSE (M-mode): 2.2 cm   LEFT ATRIUM             Index        RIGHT ATRIUM           Index  LA diam:        5.00 cm 2.04 cm/m   RA Area:     18.80 cm  LA Vol (A2C):   86.2 ml 35.19 ml/m  RA Volume:   51.40 ml  20.98 ml/m  LA Vol (A4C):   65.0 ml 26.54 ml/m  LA Biplane Vol: 76.9 ml 31.39 ml/m   AORTIC VALVE  AV Area (Vmax):    2.12 cm  AV Area (Vmean):   2.18 cm  AV Area (VTI):     2.42 cm  AV Vmax:           175.50 cm/s  AV Vmean:          113.500 cm/s  AV VTI:            0.358 m  AV Peak Grad:  12.3 mmHg  AV Mean Grad:      6.0 mmHg  LVOT Vmax:         107.50 cm/s  LVOT  Vmean:        71.550 cm/s  LVOT VTI:          0.250 m  LVOT/AV VTI ratio: 0.70    AORTA  Ao Root diam: 2.70 cm   MITRAL VALVE                TRICUSPID VALVE  MV Area (PHT): 2.99 cm     TR Peak grad:   9.9 mmHg  MV Decel Time: 254 msec     TR Vmax:        157.00 cm/s  MV E velocity: 148.00 cm/s  MV A velocity: 139.00 cm/s  SHUNTS  MV E/A ratio:  1.06         Systemic VTI:  0.25 m                              Systemic Diam: 2.10 cm   Maudine Sos MD  Electronically signed by Maudine Sos MD  Signature Date/Time: 07/25/2023/3:54:16 PM      Recent Radiology Findings:  CLINICAL DATA:  Shortness of breath.   EXAM: PORTABLE CHEST 1 VIEW   COMPARISON:  Chest radiograph dated 09/07/2019.   FINDINGS: Diffuse interstitial densities as well as diffuse airspace density throughout the left lung may represent asymmetric edema, pneumonia, or combination. Clinical correlation recommended. No pleural effusion pneumothorax. The cardiac silhouette is within normal limits. No acute osseous pathology   IMPRESSION: Asymmetric edema, pneumonia, or combination.     Electronically Signed   By: Angus Bark M.D.   On: 07/24/2023 14:32 I have independently reviewed the above radiologic studies and discussed with the patient   Recent Lab Findings: Lab Results  Component Value Date   WBC 10.9 (H) 07/26/2023   HGB 16.3 07/26/2023   HCT 48.0 07/26/2023   PLT 260 07/26/2023   GLUCOSE 141 (H) 07/26/2023   CHOL 214 (H) 07/25/2023   TRIG 79 07/25/2023   HDL 44 07/25/2023   LDLCALC 154 (H) 07/25/2023   ALT 19 07/24/2023   AST 26 07/24/2023   NA 130 (L) 07/26/2023   K 4.1 07/26/2023   CL 92 (L) 07/26/2023   CREATININE 1.01 07/26/2023   BUN 25 (H) 07/26/2023   CO2 31 07/26/2023   TSH 0.806 07/25/2023   INR 0.98 01/09/2018   HGBA1C 6.2 (H) 07/24/2023      Assessment / Plan:      - Severe multivessel coronary artery disease in a 73 year old male with a past history of  heart failure with preserved ejection fraction now presenting with acute non-ST elevation myocardial infarction and regional wall motion abnormality in the anterior wall.  He is currently pain-free at rest.  He has been diuresed with good response and is currently free of angina and shortness of breath.  The coronary arteries are diffusely diseased with a relatively small LAD and circumflex.  Dr. Luna Salinas will review and determine suitability for bypass grafting.  Further discussion will follow.  -Paroxysmal atrial fibrillation: Currently in sinus rhythm.  To resume Eliquis  following catheterization.  -Dyslipidemia: Intolerant of statin therapy.  Cardiology addressing lipid management.   -Type 2 diabetes mellitus: Managed with insulin .  - Mild mitral insufficiency: Calcified mitral valve annulus   I  spent 25 minutes counseling the patient face  to face.  Leata Providence, PA-C  07/27/2023 10:28 AM  I have seen and examined Mr. Scogin.  I reviewed his records and catheterization images.  73 year old man with a history of obesity, hypertension, dyslipidemia, tobacco abuse, DVT, and a single episode of paroxysmal atrial fibrillation.  Presented with acute onset of severe shortness of breath.  Found to be hypoxic requiring BiPAP.  Chest x-ray showed asymmetric edema versus pneumonia.  Troponin greater than 7000.  He was admitted and diuresed.  Catheterization he was found to have severe three-vessel coronary disease with diffusely diseased targets particularly his LAD.  Currently pain-free.  Although he has diffusely diseased suboptimal targets, I do believe he is a candidate for CABG.  He understands he is at higher risk for early or late graft failure and recurrent angina.  Still, I believe that is his best option and provides the best chance of long-term survival.  I informed him of the general nature of the procedure.  The plan would be to do coronary bypass grafting and a left atrial  appendage clip.  He understands the need for general anesthesia, the incisions to be used, the use of cardiopulmonary bypass, the use of temporary pacemaker wires and drainage tubes postoperatively, the expected hospital stay, and the overall recovery.  I informed him of the indications, risks, benefits, and alternatives.  He understands the risks include, but not limited to death, MI, DVT, PE, bleeding, possible need for transfusion, infection, cardiac arrhythmias, respiratory or renal failure, early or late graft failure, as well as possibility of other unforeseeable complications.  He accepts the risk and wishes to proceed.  Tentatively plan for coronary bypass grafting second case on Monday, 07/30/2023

## 2023-07-27 NOTE — Progress Notes (Signed)
 OT Cancellation Note  Patient Details Name: [name]. MRN: [id number] DOB: 1950/10/18   Cancelled Treatment:    Reason Eval/Treat Not Completed: Patient at procedure or test/ unavailable Patient off floor for heart cath and stenting. OT will complete evaluation when medically appropriate.   Mollie Anger E. Esbeidy Mclaine, OTR/L Acute Rehabilitation Services [phone]   [name] 07/27/2023, 7:54 AM

## 2023-07-27 NOTE — Assessment & Plan Note (Addendum)
 Last A1c on record June 2022 is 7.3, improved to 6.2 this admission.  CBGs in mid 100s, 9 units SAI in past 24 hours. Home regimen: Lantus  20 units BID, NOT taking Jardiance - CBGs ACHS (does have CGM, but per pharmacy prefer to still do CBGs) - Resistant SSI - Carb modified diet - Continue LAI 20 units daily, held today for L/R [facility] and restart tomorrow 5/30

## 2023-07-27 NOTE — Progress Notes (Signed)
 Daily Progress Note Intern Pager: [phone]  Patient name: [name]. Medical record number: [id number] Date of birth: 08/19/1950 Age: 73 y.o. Gender: male  Primary Care Provider: Administration, [facility] Consultants: Cardiology, cardiothoracic surgery Code Status: Full  Pt Overview and Major Events to Date:  5/27-admitted, started IV Lasix  for diuresis 5/28-cardiology consulted, TEE with LV RWMAb, heparin  GTT initiated 3/30- L/R HC, CT surgery consulted for CABG eval  Assessment and Plan: [name] is a 73 year old male with PMH of T2DM, A-fib on Eliquis , HTN, HLD, RBBB, and OSA on CPAP admitted for AHRF and found to have NSTEMI. L/R HC which showed significant LAD stenosis Assessment & Plan NSTEMI (non-ST elevated myocardial infarction) [facility])  Cardiomyopathy Cardiac Cath show severe  multi-level carotid stenosis. CTS following and considering CABG with left artrial appendage clip tentatively for Monday 07/30/23  ; recommend optimization of medical management including diuresis before procedure. Deemed poor candidate for PCI. - Strict I&Os, daily weights, and heart healthy diet - Continuous cardiac monitoring and pulse oximetry - PT/OT eval and treat - AM BMP and Mag, Mag; goal K>4, Mag>2 -K this morning 3.8, ordered  PO KCL - Cardiology consulted, appreciate recommendations:  -Heparin  gtt and transition to Eliquis  when appropriate  -Loaded with ASA 324 mg, continue ASA 81 mg daily  -Continue carvedilol  25 mg BID  -Likely resume Entresto, decision pending  -Hold Jardiance until DC  -Lasix  40 mg IV BID -CT surgery consulted, follow up recommendations Alcohol use  Hyponatremia CIWAs discontinued with no signs of withdrawal - Thiamine , folate, and MVI daily - Consult to [name] COPD (chronic obstructive pulmonary disease) (HCC) Stable - DuoNebs Q6h PRN - Incruse Ellipta  (Spiriva Respimat formulary equivalent) daily - O2 sat goal 88-92%, BiPAP nightly for  OSA - Recommended marijuana cessation or at least discontinuing smoking inhalation T2DM (type 2 diabetes mellitus) (HCC) Cruz A1c 6.2 this admission. Currently on 20U LAI with adequate control - CBGs ACHS (does have CGM, but per pharmacy prefer to still do CBGs) - Resistant SSI - Carb modified diet - Continue LAI 20 units daily, held today for L/R [facility] and restart tomorrow 5/30 Chronic health problem Afib: Eliquis  5 mg BID held for heparin  gtt, 44% burden Afib on monitor outpatient and on longterm AC Insomnia: Continue home trazodone  100 mg QHS and hydroxyzine  25 mg BID PRN HTN: As per NSTEMI above HLD: Not on ezetimibe and statin due to intolerance; LDL 154 and may need Repatha outpatient Allergies: Fluticasone  daily, olopatadine  eye drops BID, cromolyn  drops QID OSA: BiPAP per RT nightly CKD2: Recently diagnosed, Cr stable on admission  FEN/GI: Carb modified diet PPx: Heparin  GTT Dispo:Pending PT recommendations  . Barriers include medical management.   Subjective:  Patient doing well this morning. No chest pain and denies any concerns at this time.  Objective: Temp:  [97.6 F (36.4 C)-98.7 F (37.1 C)] 97.8 F (36.6 C) (05/30 1942) Pulse Rate:  [58-71] 61 (05/30 1942) Resp:  [12-20] 17 (05/30 1942) BP: (102-162)/(47-96) 135/63 (05/30 1942) SpO2:  [90 %-100 %] 97 % (05/30 1942) Weight:  [119.7 kg] 119.7 kg (05/30 0506) Physical Exam: General: Alert, well appearing, NAD CV: RRR, no murmurs, normal S1/S2 Pulm: CTAB, good WOB on RA, no crackles or wheezing Abd: Soft, no distension, no tenderness Skin: dry, warm Ext: No BLE edema   Laboratory: Most recent CBC Lab Results  Component Value Date   WBC 10.9 (H) 07/26/2023   HGB 14.6 07/27/2023   HGB 15.0 07/27/2023  HCT 43.0 07/27/2023   HCT 44.0 07/27/2023   MCV 88.4 07/26/2023   PLT 260 07/26/2023   Most recent BMP    Latest Ref Rng & Units 07/27/2023   12:01 PM  BMP  Glucose 70 - 99 mg/dL 161   BUN 8 - 23 mg/dL  20   Creatinine 0.96 - 1.24 mg/dL 0.45   Sodium 409 - 811 mmol/L 128   Potassium 3.5 - 5.1 mmol/L 3.9   Chloride 98 - 111 mmol/L 90   CO2 22 - 32 mmol/L 28   Calcium  8.9 - 10.3 mg/dL 8.6     Imaging/Diagnostic Tests: No new images  Alan Last, MD 07/27/2023, 9:24 PM  PGY-3, [facility] FPTS Intern pager: [phone], text pages welcome Secure chat group [name] Teaching Service

## 2023-07-27 NOTE — Assessment & Plan Note (Addendum)
 Afib: Eliquis 5 mg BID held for heparin gtt, 44% burden Afib on monitor outpatient and on longterm AC Insomnia: Continue home trazodone 100 mg QHS and hydroxyzine  25 mg BID PRN HTN: As per NSTEMI above HLD: Not on ezetimibe and statin due to intolerance; LDL 154 and may need Repatha outpatient Allergies: Fluticasone  daily, olopatadine  eye drops BID, cromolyn drops QID OSA: BiPAP per RT nightly CKD2: Recently diagnosed, Cr stable on admission

## 2023-07-27 NOTE — Assessment & Plan Note (Addendum)
 TTE with regional LV WMAb.  Troponins elevated and slightly downtrending.  Suspect cardiomyopathy and will follow Cardiology lead.  AHRF on admission could have been flash pulmonary edema 2/2 cardiomyopathy or ACS, now stable on room air. 24 hour UOP of 2.1 L, net negative 4.1 L this admission. Weight stable at 119.7 kg.  K stable and Mag replenished.  Creatinine stable. - Strict I&Os, daily weights, and heart healthy diet - Continuous cardiac monitoring and pulse oximetry - PT/OT eval and treat - AM BMP and Mag, Mag; goal K>4, Mag>2 - Cardiology consulted, appreciate recommendations:  -Heparin gtt and transition to Eliquis  -Loaded with ASA 324 mg, continue ASA 81 mg daily  -Continue carvedilol  25 mg BID  -Likely resume Entresto, decision pending  -Hold Jardiance until DC  -Lasix  40 mg IV BID  -LPa level  -L/R HC showing severe multivessel disease, recommend CT surgery for CABG -CT surgery consulted, follow up recommendations

## 2023-07-27 NOTE — Assessment & Plan Note (Signed)
 Stable - DuoNebs Q6h PRN - Incruse Ellipta (Spiriva Respimat formulary equivalent) daily - O2 sat goal 88-92%, BiPAP nightly for OSA - Recommended marijuana cessation or at least discontinuing smoking inhalation

## 2023-07-27 NOTE — Assessment & Plan Note (Addendum)
 Reportedly drinks 3 beers nightly, last drink was last night.  No history of WD seizures or alcohol WD.  CIWAs 0 for ~72 hours.  Does have mild-moderate hyponatremia, suspect 2/2 beer intake. - Discontinue CIWAs - Thiamine , folate, and MVI daily - Consult to [facility]

## 2023-07-27 NOTE — Progress Notes (Signed)
 PHARMACY - ANTICOAGULATION CONSULT NOTE  Pharmacy Consult for heparin  Indication: atrial fibrillation and multivessel CAD  Allergies  Allergen Reactions   Ace Inhibitors Other (See Comments)    Angioedema per pt report to admiting MD On 07/31/2019 - lisinopril    Gabapentin Swelling and Other (See Comments)     Influenza-like illness   Peanut Oil Shortness Of Breath    Swelling throat, sob  Other reaction(s): anaphylaxis   Tamiflu [Oseltamivir Phosphate] Shortness Of Breath   Losartan Swelling    Other reaction(s): Lip swelling   Lyrica [Pregabalin] Swelling   Oxcarbazepine Swelling and Other (See Comments)    Lip swelling   Statins Other (See Comments)    Myalgia -- atorvastatin, simvastatin   Avelox [Moxifloxacin Hcl In Nacl] Rash   Ciprofloxacin  Anxiety   Citalopram Anxiety    Other reaction(s): Drowsy, Anxiety   Victoza [Liraglutide] Other (See Comments)    Pancreatitis per [facility]    Patient Measurements: Height: 6\' 2"  (188 cm) Weight: 119.7 kg (264 lb) IBW/kg (Calculated) : 82.2 HEPARIN DW (KG): 107.7  Vital Signs: Temp: 98.4 F (36.9 C) (05/30 0449) Temp Source: Oral (05/30 0449) BP: 133/54 (05/30 0920) Pulse Rate: 62 (05/30 0920)  Labs: Recent Labs    07/24/23 1224 07/25/23 0613 07/25/23 1524 07/25/23 1800 07/25/23 2006 07/25/23 2358 07/26/23 0727 07/26/23 0907 07/26/23 1416 07/26/23 1620  HGB 17.0  --   --   --   --   --   --   --  16.3  --   HCT 50.4  --   --   --   --   --   --   --  48.0  --   PLT 308  --   --   --   --   --   --   --  260  --   APTT  --   --   --   --   --  53*  --  70*  --  104*  HEPARINUNFRC  --   --   --   --   --   --   --  >1.10*  --   --   CREATININE 0.96 0.95  --   --   --   --  1.01  --   --   --   TROPONINIHS  --   --  7,064* 6,267* 6,105*  --   --   --   --   --     Estimated Creatinine Clearance: 90.9 mL/min (by C-G formula based on SCr of 1.01 mg/dL).   Medical History: Past Medical History:  Diagnosis Date    Allergy    Anxiety    Arthritis    Cataract    bilateral lens implants   Clotting disorder (HCC)    DVT in 2006 x1; no problems since   Colon stricture - diverticular (HCC) 09/07/2019   Diabetes mellitus    2   Diverticulitis    Diverticulosis    DVT (deep venous thrombosis) (HCC) 2007   left leg   Hepatitis 1973   [facility]   HLD (hyperlipidemia)    Hypertension    Neuromuscular disorder (HCC)    neuropathy   Peripheral vascular disease (HCC) 2006   dvt 's   Pneumonia 2006   RBBB    Sleep apnea    Subdural hematoma (HCC)     Assessment: 73 yo M admitted for CHF exacerbation, with new NSTEMI. He was on  apixaban  PTA for afib with remote hx of ICH. Cardiology recommends lifelong anticoagulation. Held apixaban  for cath 5/30, last dose 5/27 at 5am. [facility] revealed multivessel CAD, considering CABG. Pharmacy consulted to resume heparin  2 hr s/p TR band removal (removed 10:35).    Given recent apixaban  use, will monitor anticoagulation using aPTT until aPTT and heparin  levels correlate.   Last aPTT supratherapeutic (104 - 5/29 at 1620), rate reduced to 1750 units/hr with no f/u level d/t L/RHC.   Goal of Therapy:  Heparin  level 0.3-0.7 units/ml aPTT 66-102 seconds Monitor platelets by anticoagulation protocol: Yes   Plan:  Resume Heparin  infusion at 1750 units/hr at 1230 F/u aPTT in 8 hours and until correlates with heparin  level  Monitor daily aPTT, heparin  level, CBC, signs/symptoms of bleeding   Thank you for allowing pharmacy to be a part of this patient's care.   Rockie Churchman, Student Pharmacist   **Pharmacist phone directory can be found on [email or web address] listed under [facility]**

## 2023-07-28 ENCOUNTER — Other Ambulatory Visit (HOSPITAL_COMMUNITY)

## 2023-07-28 ENCOUNTER — Encounter (HOSPITAL_COMMUNITY): Payer: Self-pay | Admitting: Family Medicine

## 2023-07-28 DIAGNOSIS — H269 Unspecified cataract: Secondary | ICD-10-CM | POA: Insufficient documentation

## 2023-07-28 DIAGNOSIS — Z860100 Personal history of colon polyps, unspecified: Secondary | ICD-10-CM

## 2023-07-28 DIAGNOSIS — N401 Enlarged prostate with lower urinary tract symptoms: Secondary | ICD-10-CM | POA: Insufficient documentation

## 2023-07-28 DIAGNOSIS — I2510 Atherosclerotic heart disease of native coronary artery without angina pectoris: Secondary | ICD-10-CM | POA: Diagnosis not present

## 2023-07-28 DIAGNOSIS — L408 Other psoriasis: Secondary | ICD-10-CM

## 2023-07-28 DIAGNOSIS — I803 Phlebitis and thrombophlebitis of lower extremities, unspecified: Secondary | ICD-10-CM

## 2023-07-28 DIAGNOSIS — E042 Nontoxic multinodular goiter: Secondary | ICD-10-CM

## 2023-07-28 DIAGNOSIS — F411 Generalized anxiety disorder: Secondary | ICD-10-CM | POA: Insufficient documentation

## 2023-07-28 DIAGNOSIS — H903 Sensorineural hearing loss, bilateral: Secondary | ICD-10-CM | POA: Insufficient documentation

## 2023-07-28 DIAGNOSIS — T17318A Gastric contents in larynx causing other injury, initial encounter: Secondary | ICD-10-CM | POA: Insufficient documentation

## 2023-07-28 DIAGNOSIS — I214 Non-ST elevation (NSTEMI) myocardial infarction: Secondary | ICD-10-CM | POA: Diagnosis not present

## 2023-07-28 DIAGNOSIS — Z789 Other specified health status: Secondary | ICD-10-CM | POA: Insufficient documentation

## 2023-07-28 HISTORY — DX: Other psoriasis: L40.8

## 2023-07-28 HISTORY — DX: Personal history of colon polyps, unspecified: Z86.0100

## 2023-07-28 HISTORY — DX: Nontoxic multinodular goiter: E04.2

## 2023-07-28 HISTORY — DX: Phlebitis and thrombophlebitis of lower extremities, unspecified: I80.3

## 2023-07-28 LAB — GLUCOSE, CAPILLARY
Glucose-Capillary: 202 mg/dL — ABNORMAL HIGH (ref 70–99)
Glucose-Capillary: 115 mg/dL — ABNORMAL HIGH (ref 70–99)
Glucose-Capillary: 130 mg/dL — ABNORMAL HIGH (ref 70–99)
Glucose-Capillary: 152 mg/dL — ABNORMAL HIGH (ref 70–99)

## 2023-07-28 LAB — BASIC METABOLIC PANEL WITH GFR
Anion gap: 6 (ref 5–15)
BUN: 18 mg/dL (ref 8–23)
CO2: 28 mmol/L (ref 22–32)
Calcium: 8.1 mg/dL — ABNORMAL LOW (ref 8.9–10.3)
Chloride: 93 mmol/L — ABNORMAL LOW (ref 98–111)
Creatinine, Ser: 0.9 mg/dL (ref 0.61–1.24)
GFR, Estimated: >60 mL/min (ref >60)
Glucose, Bld: 127 mg/dL — ABNORMAL HIGH (ref 70–99)
Potassium: 3.8 mmol/L (ref 3.5–5.1)
Sodium: 127 mmol/L — ABNORMAL LOW (ref 135–145)

## 2023-07-28 LAB — CBC
HCT: 41.1 % (ref 39.0–52.0)
Hemoglobin: 14 g/dL (ref 13.0–17.0)
MCH: 29.9 pg (ref 26.0–34.0)
MCHC: 34.1 g/dL (ref 30.0–36.0)
MCV: 87.6 fL (ref 80.0–100.0)
Platelets: 210 10*3/uL (ref 150–400)
RBC: 4.69 MIL/uL (ref 4.22–5.81)
RDW: 13.4 % (ref 11.5–15.5)
WBC: 6.8 10*3/uL (ref 4.0–10.5)
nRBC: 0 % (ref 0.0–0.2)

## 2023-07-28 LAB — LIPOPROTEIN A (LPA): Lipoprotein (a): 41.8 nmol/L — ABNORMAL HIGH (ref <75.0)

## 2023-07-28 LAB — MAGNESIUM: Magnesium: 2 mg/dL (ref 1.7–2.4)

## 2023-07-28 LAB — APTT
aPTT: 103 s — ABNORMAL HIGH (ref 24–36)
aPTT: 85 s — ABNORMAL HIGH (ref 24–36)

## 2023-07-28 LAB — HEPARIN LEVEL (UNFRACTIONATED): Heparin Unfractionated: 0.81 [IU]/mL — ABNORMAL HIGH (ref 0.30–0.70)

## 2023-07-28 MED ORDER — HEPARIN 30,000 UNITS/1000 ML (OHS) CELLSAVER SOLUTION
Status: DC
  Filled 2023-07-28: qty 1000

## 2023-07-28 MED ORDER — CEFAZOLIN SODIUM-DEXTROSE 3-4 GM/150ML-% IV SOLN
3.0000 g | INTRAVENOUS | Status: AC
  Administered 2023-07-30 (×2): 3 g via INTRAVENOUS
  Filled 2023-07-28 (×2): qty 150

## 2023-07-28 MED ORDER — MAGNESIUM SULFATE 50 % IJ SOLN
40.0000 meq | INTRAMUSCULAR | Status: DC
  Filled 2023-07-28: qty 9.85

## 2023-07-28 MED ORDER — DEXMEDETOMIDINE HCL IN NACL 400 MCG/100ML IV SOLN
0.1000 ug/kg/h | INTRAVENOUS | Status: AC
  Administered 2023-07-30: .3 ug/kg/h via INTRAVENOUS
  Filled 2023-07-28: qty 100

## 2023-07-28 MED ORDER — NITROGLYCERIN IN D5W 200-5 MCG/ML-% IV SOLN
2.0000 ug/min | INTRAVENOUS | Status: DC
  Filled 2023-07-28: qty 250

## 2023-07-28 MED ORDER — MILRINONE LACTATE IN DEXTROSE 20-5 MG/100ML-% IV SOLN
0.3000 ug/kg/min | INTRAVENOUS | Status: AC
  Administered 2023-07-30: .25 ug/kg/min via INTRAVENOUS
  Filled 2023-07-28: qty 100

## 2023-07-28 MED ORDER — POTASSIUM CHLORIDE 20 MEQ PO PACK
40.0000 meq | PACK | Freq: Once | ORAL | Status: AC
  Administered 2023-07-28: 40 meq via ORAL
  Filled 2023-07-28: qty 2

## 2023-07-28 MED ORDER — VANCOMYCIN HCL 1500 MG/300ML IV SOLN
1500.0000 mg | INTRAVENOUS | Status: AC
  Administered 2023-07-30: 1500 mg via INTRAVENOUS
  Filled 2023-07-28: qty 300

## 2023-07-28 MED ORDER — HEPARIN (PORCINE) 25000 UT/250ML-% IV SOLN
1650.0000 [IU]/h | INTRAVENOUS | Status: DC
  Administered 2023-07-28 – 2023-07-30 (×3): 1650 [IU]/h via INTRAVENOUS
  Filled 2023-07-28 (×3): qty 250

## 2023-07-28 MED ORDER — TRANEXAMIC ACID 1000 MG/10ML IV SOLN
1.5000 mg/kg/h | INTRAVENOUS | Status: AC
  Administered 2023-07-30: 1.5 mg/kg/h via INTRAVENOUS
  Filled 2023-07-28 (×2): qty 25

## 2023-07-28 MED ORDER — PHENYLEPHRINE HCL-NACL 20-0.9 MG/250ML-% IV SOLN
30.0000 ug/min | INTRAVENOUS | Status: AC
  Administered 2023-07-30: 30 ug/min via INTRAVENOUS
  Filled 2023-07-28: qty 250

## 2023-07-28 MED ORDER — CEFAZOLIN SODIUM-DEXTROSE 2-4 GM/100ML-% IV SOLN
2.0000 g | INTRAVENOUS | Status: DC
  Filled 2023-07-28: qty 100

## 2023-07-28 MED ORDER — INSULIN REGULAR(HUMAN) IN NACL 100-0.9 UT/100ML-% IV SOLN
INTRAVENOUS | Status: AC
  Administered 2023-07-30: 2.6 [IU]/h via INTRAVENOUS
  Filled 2023-07-28: qty 100

## 2023-07-28 MED ORDER — EPINEPHRINE HCL 5 MG/250ML IV SOLN IN NS
0.0000 ug/min | INTRAVENOUS | Status: DC
  Filled 2023-07-28: qty 250

## 2023-07-28 MED ORDER — TRANEXAMIC ACID (OHS) PUMP PRIME SOLUTION
2.0000 mg/kg | INTRAVENOUS | Status: DC
  Filled 2023-07-28: qty 2.51

## 2023-07-28 MED ORDER — TRANEXAMIC ACID (OHS) BOLUS VIA INFUSION
15.0000 mg/kg | INTRAVENOUS | Status: AC
  Administered 2023-07-30: 1884 mg via INTRAVENOUS
  Filled 2023-07-28: qty 1884

## 2023-07-28 MED ORDER — POTASSIUM CHLORIDE 2 MEQ/ML IV SOLN
80.0000 meq | INTRAVENOUS | Status: DC
  Filled 2023-07-28: qty 40

## 2023-07-28 MED ORDER — NOREPINEPHRINE 4 MG/250ML-% IV SOLN
0.0000 ug/min | INTRAVENOUS | Status: DC
  Filled 2023-07-28: qty 250

## 2023-07-28 MED ORDER — PLASMA-LYTE A IV SOLN
INTRAVENOUS | Status: DC
  Filled 2023-07-28: qty 2.5

## 2023-07-28 NOTE — Progress Notes (Signed)
 Rounding Note   Patient Name: [name]. Date of Encounter: 07/28/2023  [facility] Cardiologist: Nada Auer  Subjective No complaints  Scheduled Meds:  aspirin  EC  81 mg Oral Daily   carvedilol   25 mg Oral BID   [START ON 07/30/2023] chlorhexidine   15 mL Mouth/Throat Once   [START ON 07/29/2023] Chlorhexidine  Gluconate Cloth  6 each Topical Once   And   [START ON 07/30/2023] Chlorhexidine  Gluconate Cloth  6 each Topical Once   [START ON 07/30/2023] diazepam   5 mg Oral Once   fluticasone   2 spray Each Nare Daily   folic acid   1 mg Oral Daily   furosemide   40 mg Oral Daily   insulin  aspart  0-20 Units Subcutaneous TID WC   insulin  aspart  0-5 Units Subcutaneous QHS   insulin  glargine-yfgn  20 Units Subcutaneous Daily   [START ON 07/30/2023] metoprolol  tartrate  12.5 mg Oral Once   multivitamin with minerals  1 tablet Oral Daily   olopatadine   1 drop Both Eyes BID   pantoprazole   40 mg Oral Daily   sodium chloride  flush  3 mL Intravenous Q12H   sodium chloride  flush  3 mL Intravenous Q12H   terazosin   2 mg Oral QHS   thiamine   100 mg Oral Daily   Or   thiamine   100 mg Intravenous Daily   traZODone   100 mg Oral QHS   umeclidinium bromide   1 puff Inhalation Daily   Continuous Infusions:  sodium chloride      heparin      PRN Meds: sodium chloride , acetaminophen  **OR** acetaminophen , ALPRAZolam , cromolyn , hydrOXYzine , ipratropium-albuterol , ondansetron  (ZOFRAN ) IV, polyethylene glycol, sodium chloride  flush   Vital Signs  Vitals:   07/27/23 2343 07/28/23 0113 07/28/23 0405 07/28/23 0441  BP: (!) 127/48   (!) 143/64  Pulse: 60 60  (!) 56  Resp: 17 17  17   Temp: 98.1 F (36.7 C)   (!) 97.5 F (36.4 C)  TempSrc:      SpO2: 99% 99%  100%  Weight:   125.6 kg   Height:        Intake/Output Summary (Last 24 hours) at 07/28/2023 0817 Last data filed at 07/27/2023 2215 Gross per 24 hour  Intake 158.77 ml  Output --  Net 158.77 ml      07/28/2023     4:05 AM 07/27/2023    5:06 AM 07/26/2023    5:00 AM  Last 3 Weights  Weight (lbs) 276 lb 14.4 oz 264 lb 262 lb 14.4 oz  Weight (kg) 125.6 kg 119.75 kg 119.251 kg      Telemetry NSR - Personally Reviewed  ECG  N/a - Personally Reviewed  Physical Exam  GEN: No acute distress.   Neck: No JVD Cardiac: RRR, no murmurs, rubs, or gallops.  Respiratory: Clear to auscultation bilaterally. GI: Soft, nontender, non-distended  MS: No edema; No deformity. Neuro:  Nonfocal  Psych: Normal affect   Labs High Sensitivity Troponin:   Recent Labs  Lab 07/25/23 1524 07/25/23 1800 07/25/23 2006  TROPONINIHS 7,064* 6,267* 6,105*     Chemistry Recent Labs  Lab 07/24/23 1224 07/25/23 1610 07/26/23 0727 07/27/23 0805 07/27/23 1201 07/28/23 0446  NA 131*   < > 130* 130*  129* 128* 127*  K 4.4   < > 4.1 3.7  3.7 3.9 3.8  CL 96*   < > 92*  --  90* 93*  CO2 24   < > 31  --  28 28  GLUCOSE 244*   < > 141*  --  201* 127*  BUN 16   < > 25*  --  20 18  CREATININE 0.96   < > 1.01  --  0.84 0.90  CALCIUM  8.1*   < > 8.9  --  8.6* 8.1*  MG 1.8   < > 1.9  --  2.2 2.0  PROT 6.2*  --   --   --   --   --   ALBUMIN 3.2*  --   --   --   --   --   AST 26  --   --   --   --   --   ALT 19  --   --   --   --   --   ALKPHOS 56  --   --   --   --   --   BILITOT 1.0  --   --   --   --   --   GFRNONAA >60   < > >60  --  >60 >60  ANIONGAP 11   < > 7  --  10 6   < > = values in this interval not displayed.    Lipids  Recent Labs  Lab 07/25/23 0613  CHOL 214*  TRIG 79  HDL 44  LDLCALC 154*  CHOLHDL 4.9    Hematology Recent Labs  Lab 07/24/23 1224 07/26/23 1416 07/27/23 0805 07/28/23 0446  WBC 10.3 10.9*  --  6.8  RBC 5.67 5.43  --  4.69  HGB 17.0 16.3 15.0  14.6 14.0  HCT 50.4 48.0 44.0  43.0 41.1  MCV 88.9 88.4  --  87.6  MCH 30.0 30.0  --  29.9  MCHC 33.7 34.0  --  34.1  RDW 13.5 13.7  --  13.4  PLT 308 260  --  210   Thyroid  Recent Labs  Lab 07/25/23 0613  TSH 0.806     BNP Recent Labs  Lab 07/24/23 1224  BNP 243.3*    DDimer  Recent Labs  Lab 07/24/23 1755  DDIMER 0.59*     Radiology  DG Chest 2 View Result Date: 07/27/2023 CLINICAL DATA:  Pulmonary edema EXAM: CHEST - 2 VIEW COMPARISON:  07/24/2023 FINDINGS: Opacity overlying the right peripheral lung base appears new from prior examination and may represent an object overlying the patient. Previously noted consolidation within the left lung base has resolved. Lungs are otherwise clear. No pneumothorax or pleural effusion. Cardiac size is within normal limits. IMPRESSION: 1. Interval resolution of left basilar consolidation. 2. New opacity overlying the right lung base may represent an object overlying the patient. Attention on follow-up. Electronically Signed   By: Worthy Heads M.D.   On: 07/27/2023 22:32   CARDIAC CATHETERIZATION Result Date: 07/27/2023   Prox RCA lesion is 40% stenosed.   Dist RCA lesion is 75% stenosed.   RPDA lesion is 80% stenosed.   1st RPL lesion is 80% stenosed.   1st Mrg lesion is 90% stenosed.   1st Diag lesion is 95% stenosed.   Prox LAD to Mid LAD lesion is 90% stenosed.   Mid LAD to Dist LAD lesion is 90% stenosed.   Dist LAD lesion is 90% stenosed.   LV end diastolic pressure is normal. 1.  Patent left main with no significant stenosis 2.  Severe diffuse LAD stenosis with predominant disease in the mid and distal vessel 3.  Severe first diagonal stenosis 4.  Severe diffuse first OM stenosis 5.  Severe RCA and PDA/PLA stenoses 6.  Normal LVEDP 7.  Right heart catheterization data: Preserved cardiac output and index of 5.6 and 2.6, respectively RA mean 7 mmHg RV 39/10 mmHg PA 36/13 mean 24 mmHg Pulmonary capillary wedge pressure mean 15 mmHg Recommendations: Resume IV heparin  post catheterization.  Cardiac surgical consultation for consideration of CABG.  Targets are marginal and if turndown for surgery we will treat with aggressive medical therapy.  Not a candidate for PCI due  to severe multivessel diffuse disease.    Cardiac Studies   Patient Profile   [name]. is a 73 y.o. male with a hx of paroxysmal atrial fibrillation on eliquis , HFpEF, mild nonobstructive CAD s/p cath in 2010, alcohol use, cannabis use, history of tobacco use, mild mitral stenosis, hyperlipidemia, lower extremity edema, COPD, T2DM, HTN, BIPAP, CKD2, remote history of intracranial hemorrhage, remote history of provoked DVT who is being seen 07/25/2023 for the evaluation of acute hypoxic respiratory failure at the request of Patrica Bookman.   Assessment & Plan  1.NSTEMI - cath 2010 mild CAD - trop up to 7000, EKG SR, RBBB, no acute ischemic changes - 06/2023 echo: LVEF 55-60%, grade II dd,  - 06/2023 cath: LM mild diffuse disease, prox LAD 90% and mid to distal 90%, D1 95%, OM1 90%, RCA prox 40% and distal 75%, RPDA 80%, RPL 80%. Mean PA 24, PCWP 15, CI 2.6 -  CT surgery evaluation. From interventional note no PCI targets.  - from CT surgery note plans for CABG 07/30/23.  - medical therapy with ASA 81, coreg  25mg  bid, hep gtt. Statin intolerance.   2.PAF -on eliquis  at home, on hold in setting of ACS with plans for CABG    3. Chronic HFpEF - 06/2023 echo: LVEF 55-60%, grade II dd,  - BNP 243, CXR no acute process - diuresed this admission, now on oral diuretic - cath  PCWP 15, LVEDP 14  4. HLD - he reports tried on several statins, did not tolerate.  - recently tried on zetia, reported oral swelling - LDL 154. He is followed at [facility] for cardiology. Consider [id number] at outpatient f/u    No additional cardiology recs over the weekend.   For questions or updates, please contact [facility] Please consult [email or web address] for contact info under     Signed, Armida Lander, MD  07/28/2023, 8:17 AM

## 2023-07-28 NOTE — Plan of Care (Signed)

## 2023-07-28 NOTE — Progress Notes (Signed)
 PHARMACY - ANTICOAGULATION CONSULT NOTE  Pharmacy Consult for Heparin  Indication: atrial fibrillation, ACS/NSTEMI, CAD awaiting CABG  Allergies  Allergen Reactions   Ace Inhibitors Other (See Comments)    Angioedema per pt report to admiting MD On 07/31/2019 - lisinopril    Gabapentin Swelling and Other (See Comments)     Influenza-like illness   Peanut Oil Shortness Of Breath    Swelling throat, sob  Other reaction(s): anaphylaxis   Tamiflu [Oseltamivir Phosphate] Shortness Of Breath   Losartan Swelling    Other reaction(s): Lip swelling   Lyrica [Pregabalin] Swelling   Oxcarbazepine Swelling and Other (See Comments)    Lip swelling   Statins Other (See Comments)    Myalgia -- atorvastatin, simvastatin   Avelox [Moxifloxacin Hcl In Nacl] Rash   Ciprofloxacin  Anxiety   Citalopram Anxiety    Other reaction(s): Drowsy, Anxiety   Victoza [Liraglutide] Other (See Comments)    [facility]    Patient Measurements: Height: 6\' 2"  (188 cm) Weight: 125.6 kg (276 lb 14.4 oz) IBW/kg (Calculated) : 82.2 HEPARIN  DW (KG): 107.7  Vital Signs: Temp: 97.5 F (36.4 C) (05/31 0441) BP: 143/64 (05/31 0441) Pulse Rate: 56 (05/31 0441)  Labs: Recent Labs    07/25/23 1524 07/25/23 1800 07/25/23 2006 07/25/23 2358 07/26/23 0727 07/26/23 0907 07/26/23 0907 07/26/23 1416 07/26/23 1620 07/27/23 0805 07/27/23 1201 07/27/23 2024 07/28/23 0446  HGB  --   --   --   --   --   --    < > 16.3  --  15.0  14.6  --   --  14.0  HCT  --   --   --   --   --   --   --  48.0  --  44.0  43.0  --   --  41.1  PLT  --   --   --   --   --   --   --  260  --   --   --   --  210  APTT  --   --   --    < >  --  70*  --   --  104*  --   --  78* 103*  LABPROT  --   --   --   --   --   --   --   --   --   --   --  12.7  --   INR  --   --   --   --   --   --   --   --   --   --   --  0.9  --   HEPARINUNFRC  --   --   --   --   --  >1.10*  --   --   --   --   --   --  0.81*  CREATININE  --   --   --    --  1.01  --   --   --   --   --  0.84  --  0.90  TROPONINIHS 7,064* 6,267* 6,105*  --   --   --   --   --   --   --   --   --   --    < > = values in this interval not displayed.    Estimated Creatinine Clearance: 104.5 mL/min (by C-G formula based on SCr of 0.9 mg/dL).  Assessment: 73  yo M admitted for CHF exacerbation, with new NSTEMI. He was on apixaban  PTA for afib with remote hx of ICH. Cardiology recommends lifelong anticoagulation. Holding apixaban  in anticipation of cath 5/30, last dose 5/27 at 5am. Pharmacy consulted for heparin . Will monitor heparin  dosing with aPTTs while heparin  levels are falsely elevated due to recent apixaban  use.  5/31 AM: aPTT supratherapeutic at 103 seconds, heparin  level supratherapeutic at 0.81 while on 1750 units/hr. Heparin  level falling, however will continue to utilize aPTT for dosing adjustments. Hgb stable at 140, PLT 210. Noted plans for CABG on 07/30/23. No issues with infusion and no new bleeding noted.   Goal of Therapy:  Heparin  level 0.3-0.7 units/ml aPTT 66-102 seconds Monitor platelets by anticoagulation protocol: Yes   Plan:  Decrease heparin  drip to 1650 units/hr Obtain aPTT in 8 hours Daily aPTT and heparin  level until correlating; daily CBC. Apixaban  on hold. For CABG and LAA clip on Monday  07/30/23.  Juleen Oakland, PharmD PGY1 Pharmacy Resident 07/28/2023 6:54 AM

## 2023-07-28 NOTE — Progress Notes (Signed)
 1 Day Post-Op Procedure(s) (LRB): RIGHT/LEFT HEART CATH AND CORONARY ANGIOGRAPHY (N/A) Subjective: No issues overnight  Objective: Vital signs in last 24 hours: Temp:  [97.5 F (36.4 C)-98.7 F (37.1 C)] 97.5 F (36.4 C) (05/31 0441) Pulse Rate:  [56-68] 56 (05/31 0441) Cardiac Rhythm: Normal sinus rhythm;Bundle branch block (05/31 0700) Resp:  [12-20] 17 (05/31 0441) BP: (127-158)/(47-69) 143/64 (05/31 0441) SpO2:  [90 %-100 %] 100 % (05/31 0441) Weight:  [125.6 kg] 125.6 kg (05/31 0405)  Hemodynamic parameters for last 24 hours:    Intake/Output from previous day: 05/30 0701 - 05/31 0700 In: 158.8 [I.V.:158.8] Out: -  Intake/Output this shift: No intake/output data recorded.  General appearance: alert, cooperative, and no distress Heart: regular rate and rhythm  Lab Results: Recent Labs    07/26/23 1416 07/27/23 0805 07/28/23 0446  WBC 10.9*  --  6.8  HGB 16.3 15.0  14.6 14.0  HCT 48.0 44.0  43.0 41.1  PLT 260  --  210   BMET:  Recent Labs    07/27/23 1201 07/28/23 0446  NA 128* 127*  K 3.9 3.8  CL 90* 93*  CO2 28 28  GLUCOSE 201* 127*  BUN 20 18  CREATININE 0.84 0.90  CALCIUM  8.6* 8.1*    PT/INR:  Recent Labs    07/27/23 2024  LABPROT 12.7  INR 0.9   ABG    Component Value Date/Time   PHART 7.419 07/27/2023 0805   HCO3 29.9 (H) 07/27/2023 0805   HCO3 30.9 (H) 07/27/2023 0805   TCO2 31 07/27/2023 0805   TCO2 32 07/27/2023 0805   O2SAT 98 07/27/2023 0805   O2SAT 75 07/27/2023 0805   CBG (last 3)  Recent Labs    07/27/23 1212 07/27/23 1631 07/27/23 2051  GLUCAP 207* 144* 152*    Assessment/Plan: S/P Procedure(s) (LRB): RIGHT/LEFT HEART CATH AND CORONARY ANGIOGRAPHY (N/A) 3 vessel CAD Plan is for CABG Monday 2nd case   LOS: 4 days    [name] 07/28/2023

## 2023-07-28 NOTE — Plan of Care (Signed)
  Problem: Education: Goal: Ability to describe self-care measures that may prevent or decrease complications (Diabetes Survival Skills Education) will improve Outcome: Progressing Goal: Individualized Educational Video(s) Outcome: Progressing   Problem: Coping: Goal: Ability to adjust to condition or change in health will improve Outcome: Progressing   Problem: Fluid Volume: Goal: Ability to maintain a balanced intake and output will improve Outcome: Progressing   Problem: Health Behavior/Discharge Planning: Goal: Ability to identify and utilize available resources and services will improve Outcome: Progressing Goal: Ability to manage health-related needs will improve Outcome: Progressing   Problem: Metabolic: Goal: Ability to maintain appropriate glucose levels will improve Outcome: Progressing   Problem: Nutritional: Goal: Maintenance of adequate nutrition will improve Outcome: Progressing Goal: Progress toward achieving an optimal weight will improve Outcome: Progressing   Problem: Skin Integrity: Goal: Risk for impaired skin integrity will decrease Outcome: Progressing   Problem: Tissue Perfusion: Goal: Adequacy of tissue perfusion will improve Outcome: Progressing   Problem: Education: Goal: Knowledge of General Education information will improve Description: Including pain rating scale, medication(s)/side effects and non-pharmacologic comfort measures Outcome: Progressing   Problem: Health Behavior/Discharge Planning: Goal: Ability to manage health-related needs will improve Outcome: Progressing   Problem: Clinical Measurements: Goal: Ability to maintain clinical measurements within normal limits will improve Outcome: Progressing Goal: Will remain free from infection Outcome: Progressing Goal: Diagnostic test results will improve Outcome: Progressing Goal: Respiratory complications will improve Outcome: Progressing Goal: Cardiovascular complication will  be avoided Outcome: Progressing   Problem: Activity: Goal: Risk for activity intolerance will decrease Outcome: Progressing   Problem: Activity: Goal: Risk for activity intolerance will decrease Outcome: Progressing   Problem: Nutrition: Goal: Adequate nutrition will be maintained Outcome: Progressing   Problem: Coping: Goal: Level of anxiety will decrease Outcome: Progressing   Problem: Elimination: Goal: Will not experience complications related to bowel motility Outcome: Progressing Goal: Will not experience complications related to urinary retention Outcome: Progressing   Problem: Pain Managment: Goal: General experience of comfort will improve and/or be controlled Outcome: Progressing   Problem: Safety: Goal: Ability to remain free from injury will improve Outcome: Progressing   Problem: Skin Integrity: Goal: Risk for impaired skin integrity will decrease Outcome: Progressing   Problem: Education: Goal: Understanding of CV disease, CV risk reduction, and recovery process will improve Outcome: Progressing Goal: Individualized Educational Video(s) Outcome: Progressing   Problem: Activity: Goal: Ability to return to baseline activity level will improve Outcome: Progressing   Problem: Cardiovascular: Goal: Ability to achieve and maintain adequate cardiovascular perfusion will improve Outcome: Progressing Goal: Vascular access site(s) Level 0-1 will be maintained Outcome: Progressing

## 2023-07-28 NOTE — Progress Notes (Signed)
 OT Screen Note  Patient Details Name: [name]. MRN: [id number] DOB: [birth year]   Cancelled Treatment:    Reason Eval/Treat Not Completed: OT screened, no needs identified, will sign off (Entered pt room with pt standing at bedside. Performed quick assessment of pt skills and he is functioning at an independent level for mobility and ADLs. OT signing off, reconsult post op)  07/28/2023  AB, OTR/L  Acute Rehabilitation Services  Office: [phone]   Jorene New 07/28/2023, 4:11 PM

## 2023-07-28 NOTE — Progress Notes (Signed)
 CARDIAC REHAB PHASE I Ed given to pt and family. Discussed surgery, sternal precautions, IS use, OHS booklet, ambulation, and care after discharge. Pt and daughter had several questions. Answered as many questions as I could. Pt voiced understanding. Will continue to follow.  [phone] Floretta Huron, MS, ACSM-CEP 07/28/2023 10:30 AM

## 2023-07-28 NOTE — Progress Notes (Signed)
   07/28/23 0113  BiPAP/CPAP/SIPAP  $ Non-Invasive Home Ventilator  Subsequent  BiPAP/CPAP/SIPAP Pt Type Adult  BiPAP/CPAP/SIPAP Resmed  Mask Type Nasal mask  PEEP 5 cmH20  Patient Home Machine No  Patient Home Mask Yes  Patient Home Tubing Yes  Auto Titrate No  Device Plugged into RED Power Outlet Yes  BiPAP/CPAP /SiPAP Vitals  Pulse Rate 60  Resp 17  SpO2 99 %  Bilateral Breath Sounds Clear;Diminished  MEWS Score/Color  MEWS Score 0  MEWS Score Color [name]

## 2023-07-28 NOTE — Progress Notes (Signed)
 PHARMACY - ANTICOAGULATION CONSULT NOTE  Pharmacy Consult for Heparin  Indication: atrial fibrillation, ACS/NSTEMI, CAD awaiting CABG  Allergies  Allergen Reactions   Ace Inhibitors Other (See Comments)    Angioedema per pt report to admiting MD On 07/31/2019 - lisinopril    Gabapentin Swelling and Other (See Comments)     Influenza-like illness   Peanut Oil Shortness Of Breath    Swelling throat, sob  Other reaction(s): anaphylaxis   Tamiflu [Oseltamivir Phosphate] Shortness Of Breath   Losartan Swelling    Other reaction(s): Lip swelling   Lyrica [Pregabalin] Swelling   Oxcarbazepine Swelling and Other (See Comments)    Lip swelling   Statins Other (See Comments)    Myalgia -- atorvastatin, simvastatin   Avelox [Moxifloxacin Hcl In Nacl] Rash   Ciprofloxacin  Anxiety   Citalopram Anxiety    Other reaction(s): Drowsy, Anxiety   Victoza [Liraglutide] Other (See Comments)    [facility]    Patient Measurements: Height: 6\' 2"  (188 cm) Weight: 125.6 kg (276 lb 14.4 oz) IBW/kg (Calculated) : 82.2 HEPARIN  DW (KG): 107.7  Vital Signs: Temp: 97.8 F (36.6 C) (05/31 0924) BP: 137/54 (05/31 0924) Pulse Rate: 59 (05/31 0924)  Labs: Recent Labs     0000 07/25/23 1800 07/25/23 2006 07/25/23 2358 07/26/23 0727 07/26/23 0907 07/26/23 1416 07/26/23 1620 07/27/23 0805 07/27/23 1201 07/27/23 2024 07/28/23 0446 07/28/23 1611  HGB   < >  --   --   --   --   --  16.3  --  15.0  14.6  --   --  14.0  --   HCT  --   --   --   --   --   --  48.0  --  44.0  43.0  --   --  41.1  --   PLT  --   --   --   --   --   --  260  --   --   --   --  210  --   APTT  --   --   --    < >  --  70*  --    < >  --   --  78* 103* 85*  LABPROT  --   --   --   --   --   --   --   --   --   --  12.7  --   --   INR  --   --   --   --   --   --   --   --   --   --  0.9  --   --   HEPARINUNFRC  --   --   --   --   --  >1.10*  --   --   --   --   --  0.81*  --   CREATININE  --   --   --   --   1.01  --   --   --   --  0.84  --  0.90  --   TROPONINIHS  --  6,267* 6,105*  --   --   --   --   --   --   --   --   --   --    < > = values in this interval not displayed.    Estimated Creatinine Clearance: 104.5 mL/min (by C-G formula based on SCr of 0.9 mg/dL).  Assessment: 73 yo M admitted for CHF exacerbation, with new NSTEMI. He was on apixaban  PTA for afib with remote hx of ICH. Cardiology recommends lifelong anticoagulation. Holding apixaban  in anticipation of cath 5/30, last dose 5/27 at 5am. Pharmacy consulted for heparin . Will monitor heparin  dosing with aPTTs while heparin  levels are falsely elevated due to recent apixaban  use.  5/31 AM: aPTT supratherapeutic at 103 seconds, heparin  level supratherapeutic at 0.81 while on 1750 units/hr. Heparin  level falling, however will continue to utilize aPTT for dosing adjustments. Hgb stable at 140, PLT 210. Noted plans for CABG on 07/30/23. No issues with infusion and no new bleeding noted.  1710: therapeutic at 85  Goal of Therapy:  Heparin  level 0.3-0.7 units/ml aPTT 66-102 seconds Monitor platelets by anticoagulation protocol: Yes   Plan:  Continue heparin  1650 units/hr Daily aPTT and heparin  level until correlating; daily CBC. Apixaban  on hold. For CABG and LAA clip on Monday  07/30/23.  Heddy Liverpool, PharmD PGY2 Critical Care Pharmacy Resident 07/28/2023 5:09 PM

## 2023-07-29 ENCOUNTER — Inpatient Hospital Stay (HOSPITAL_COMMUNITY)

## 2023-07-29 DIAGNOSIS — I2510 Atherosclerotic heart disease of native coronary artery without angina pectoris: Secondary | ICD-10-CM | POA: Diagnosis not present

## 2023-07-29 DIAGNOSIS — E1169 Type 2 diabetes mellitus with other specified complication: Secondary | ICD-10-CM

## 2023-07-29 DIAGNOSIS — I214 Non-ST elevation (NSTEMI) myocardial infarction: Secondary | ICD-10-CM | POA: Diagnosis not present

## 2023-07-29 DIAGNOSIS — Z01810 Encounter for preprocedural cardiovascular examination: Secondary | ICD-10-CM | POA: Diagnosis not present

## 2023-07-29 DIAGNOSIS — Z794 Long term (current) use of insulin: Secondary | ICD-10-CM | POA: Diagnosis not present

## 2023-07-29 LAB — CBC
HCT: 41.2 % (ref 39.0–52.0)
Hemoglobin: 14.1 g/dL (ref 13.0–17.0)
MCH: 29.8 pg (ref 26.0–34.0)
MCHC: 34.2 g/dL (ref 30.0–36.0)
MCV: 87.1 fL (ref 80.0–100.0)
Platelets: 226 10*3/uL (ref 150–400)
RBC: 4.73 MIL/uL (ref 4.22–5.81)
RDW: 13.3 % (ref 11.5–15.5)
WBC: 7.4 10*3/uL (ref 4.0–10.5)
nRBC: 0 % (ref 0.0–0.2)

## 2023-07-29 LAB — GLUCOSE, CAPILLARY
Glucose-Capillary: 151 mg/dL — ABNORMAL HIGH (ref 70–99)
Glucose-Capillary: 140 mg/dL — ABNORMAL HIGH (ref 70–99)
Glucose-Capillary: 158 mg/dL — ABNORMAL HIGH (ref 70–99)
Glucose-Capillary: 174 mg/dL — ABNORMAL HIGH (ref 70–99)

## 2023-07-29 LAB — BASIC METABOLIC PANEL WITH GFR
Anion gap: 9 (ref 5–15)
BUN: 14 mg/dL (ref 8–23)
CO2: 26 mmol/L (ref 22–32)
Calcium: 8.7 mg/dL — ABNORMAL LOW (ref 8.9–10.3)
Chloride: 95 mmol/L — ABNORMAL LOW (ref 98–111)
Creatinine, Ser: 0.79 mg/dL (ref 0.61–1.24)
GFR, Estimated: >60 mL/min (ref >60)
Glucose, Bld: 128 mg/dL — ABNORMAL HIGH (ref 70–99)
Potassium: 3.8 mmol/L (ref 3.5–5.1)
Sodium: 130 mmol/L — ABNORMAL LOW (ref 135–145)

## 2023-07-29 LAB — APTT: aPTT: 88 s — ABNORMAL HIGH (ref 24–36)

## 2023-07-29 LAB — HEPARIN LEVEL (UNFRACTIONATED): Heparin Unfractionated: 0.61 [IU]/mL (ref 0.30–0.70)

## 2023-07-29 LAB — MAGNESIUM: Magnesium: 2 mg/dL (ref 1.7–2.4)

## 2023-07-29 MED ORDER — ALBUTEROL SULFATE (2.5 MG/3ML) 0.083% IN NEBU
2.5000 mg | INHALATION_SOLUTION | Freq: Four times a day (QID) | RESPIRATORY_TRACT | Status: DC | PRN
  Filled 2023-07-29: qty 3

## 2023-07-29 MED ORDER — POTASSIUM CHLORIDE CRYS ER 20 MEQ PO TBCR
40.0000 meq | EXTENDED_RELEASE_TABLET | Freq: Once | ORAL | Status: AC
  Administered 2023-07-29: 40 meq via ORAL
  Filled 2023-07-29: qty 2

## 2023-07-29 MED ORDER — TIOTROPIUM BROMIDE MONOHYDRATE 2.5 MCG/ACT IN AERS
2.0000 | INHALATION_SPRAY | Freq: Every day | RESPIRATORY_TRACT | Status: DC
  Administered 2023-07-29 – 2023-07-30 (×2): 2 via RESPIRATORY_TRACT
  Filled 2023-07-29 (×3): qty 4

## 2023-07-29 NOTE — Assessment & Plan Note (Signed)
 Last A1c 6.2 this admission. Currently on 20U LAI with adequate control - CBGs ACHS (does have CGM, but per pharmacy prefer to still do CBGs) - Resistant SSI - Carb modified diet - Continue LAI 20 units daily

## 2023-07-29 NOTE — Anesthesia Preprocedure Evaluation (Signed)
 Anesthesia Evaluation  Patient identified by MRN, date of birth, ID band Patient awake    Reviewed: Allergy & Precautions, NPO status , Patient's Chart, lab work & pertinent test results  Airway Mallampati: III  TM Distance: >3 FB Neck ROM: Limited    Dental  (+) Dental Advisory Given, Teeth Intact   Pulmonary sleep apnea and Continuous Positive Airway Pressure Ventilation , pneumonia, COPD, Patient abstained from smoking., former smoker   Pulmonary exam normal breath sounds clear to auscultation       Cardiovascular hypertension, + CAD, + Past MI and + Peripheral Vascular Disease  Normal cardiovascular exam+ dysrhythmias  Rhythm:Regular Rate:Normal  Echo 06/2023  1. Left ventricular ejection fraction, by estimation, is 55 to 60%. The left ventricle has normal function. The left ventricle demonstrates regional wall motion abnormalities (see scoring diagram/findings for description). There is moderate left ventricular hypertrophy. Left ventricular diastolic parameters are consistent with Grade II diastolic dysfunction (pseudonormalization). Elevated left ventricular end-diastolic pressure.   2. Right ventricular systolic function is normal. The right ventricular size is normal. There is normal pulmonary artery systolic pressure.   3. Left atrial size was mildly dilated.   4. The mitral valve is normal in structure. No evidence of mitral valve regurgitation. No evidence of mitral stenosis. Severe mitral annular calcification.   5. The aortic valve is tricuspid. There is moderate calcification of the aortic valve. There is moderate thickening of the aortic valve. Aortic valve regurgitation is not visualized. Aortic valve sclerosis/calcification is present, without any evidence of aortic stenosis. Aortic valve area, by VTI measures 2.42 cm. Aortic valve mean gradient measures 6.0 mmHg. Aortic valve Vmax measures 1.76 m/s.   6. The inferior vena  cava is normal in size with greater than 50% respiratory variability, suggesting right atrial pressure of 3 mmHg.    Cath 10/2023 1.  Patent left main with no significant stenosis 2.  Severe diffuse LAD stenosis with predominant disease in the mid and distal vessel 3.  Severe first diagonal stenosis 4.  Severe diffuse first OM stenosis 5.  Severe RCA and PDA/PLA stenoses 6.  Normal LVEDP 7.  Right heart catheterization data: Preserved cardiac output and index of 5.6 and 2.6, respectively RA mean 7 mmHg RV 39/10 mmHg PA 36/13 mean 24 mmHg Pulmonary capillary wedge pressure mean 15 mmHg     Neuro/Psych  PSYCHIATRIC DISORDERS Anxiety     negative neurological ROS     GI/Hepatic negative GI ROS,,,(+) Hepatitis -  Endo/Other  diabetes, Insulin  Dependent    Renal/GU negative Renal ROS     Musculoskeletal  (+) Arthritis ,    Abdominal  (+) + obese  Peds  Hematology negative hematology ROS (+)   Anesthesia Other Findings   Reproductive/Obstetrics                             Anesthesia Physical Anesthesia Plan  ASA: 4  Anesthesia Plan: General   Post-op Pain Management:    Induction: Intravenous  PONV Risk Score and Plan: 3 and Ondansetron , Dexamethasone  and Treatment may vary due to age or medical condition  Airway Management Planned: Oral ETT and Video Laryngoscope Planned  Additional Equipment: Arterial line, CVP, PA Cath, TEE and Ultrasound Guidance Line Placement  Intra-op Plan: Utilization Of Total Body Hypothermia per surgeon request  Post-operative Plan: Post-operative intubation/ventilation  Informed Consent: I have reviewed the patients History and Physical, chart, labs and discussed the procedure including the risks,  benefits and alternatives for the proposed anesthesia with the patient or authorized representative who has indicated his/her understanding and acceptance.     Dental advisory given  Plan Discussed with:  CRNA  Anesthesia Plan Comments:         Anesthesia Quick Evaluation

## 2023-07-29 NOTE — Progress Notes (Signed)
 PHARMACY - ANTICOAGULATION CONSULT NOTE  Pharmacy Consult for Heparin  Indication: atrial fibrillation, ACS/NSTEMI, CAD awaiting CABG  Allergies  Allergen Reactions   Ace Inhibitors Other (See Comments)    Angioedema per pt report to admiting MD On 07/31/2019 - lisinopril    Gabapentin Swelling and Other (See Comments)     Influenza-like illness   Peanut Oil Shortness Of Breath    Swelling throat, sob  Other reaction(s): anaphylaxis   Tamiflu [Oseltamivir Phosphate] Shortness Of Breath   Losartan Swelling    Other reaction(s): Lip swelling   Lyrica [Pregabalin] Swelling   Oxcarbazepine Swelling and Other (See Comments)    Lip swelling   Statins Other (See Comments)    Myalgia -- atorvastatin, simvastatin   Avelox [Moxifloxacin Hcl In Nacl] Rash   Ciprofloxacin  Anxiety   Citalopram Anxiety    Other reaction(s): Drowsy, Anxiety   Victoza [Liraglutide] Other (See Comments)    [facility]    Patient Measurements: Height: 6\' 2"  (188 cm) Weight: 120.6 kg (265 lb 14 oz) IBW/kg (Calculated) : 82.2 HEPARIN  DW (KG): 107.7  Vital Signs: Temp: 98.3 F (36.8 C) (06/01 0036) BP: 143/61 (06/01 0525) Pulse Rate: 64 (06/01 0525)  Labs: Recent Labs     0000 07/26/23 0907 07/26/23 1416 07/26/23 1620 07/27/23 0805 07/27/23 1201 07/27/23 2024 07/28/23 0446 07/28/23 1611 07/29/23 0450  HGB   < >  --  16.3  --  15.0  14.6  --   --  14.0  --  14.1  HCT   < >  --  48.0  --  44.0  43.0  --   --  41.1  --  41.2  PLT  --   --  260  --   --   --   --  210  --  226  APTT  --  70*  --    < >  --   --  78* 103* 85* 88*  LABPROT  --   --   --   --   --   --  12.7  --   --   --   INR  --   --   --   --   --   --  0.9  --   --   --   HEPARINUNFRC  --  >1.10*  --   --   --   --   --  0.81*  --  0.61  CREATININE  --   --   --   --   --  0.84  --  0.90  --  0.79   < > = values in this interval not displayed.    Estimated Creatinine Clearance: 115.2 mL/min (by C-G formula based on  SCr of 0.79 mg/dL).  Assessment: 73 yo M admitted for CHF exacerbation, with new NSTEMI. He was on apixaban  PTA for afib with remote hx of ICH. Cardiology recommends lifelong anticoagulation. Holding apixaban  in anticipation of cath 5/30, last dose 5/27 at 5am. Pharmacy consulted for heparin . Will monitor heparin  dosing with aPTTs while heparin  levels are falsely elevated due to recent apixaban  use.  6/1 AM: aPTT therapeutic at 88 seconds, heparin  level therapeutic at 0.61 while on 1650 units/hr. Heparin  level and aPTT both therapeutic, likely correlating at this point. Will discontinue aPTT monitoring and utilize heparin  levels moving forward. Hgb stable at 14.1, PLT 226. Noted plans for CABG on 07/30/23. No issues with infusion and no new bleeding noted.   Goal of Therapy:  Heparin  level 0.3-0.7 units/ml Monitor platelets by anticoagulation protocol: Yes   Plan:  Continue heparin  1650 units/hr Daily heparin  level and CBC. Apixaban  on hold. For CABG and LAA clip on Monday  07/30/23.  Juleen Oakland, PharmD PGY1 Pharmacy Resident 07/29/2023 8:05 AM

## 2023-07-29 NOTE — Assessment & Plan Note (Addendum)
 Cardiac Cath show severe multi-level carotid stenosis. CTS following and considering CABG with left artrial appendage clip tentatively for Monday 07/30/23; recommend optimization of medical management including diuresis before procedure. Deemed poor candidate for PCI per cardiology. - NPO at [facility] for CABG tomorrow - Strict I&Os, daily weights, and heart healthy diet - Continuous cardiac monitoring and pulse oximetry - PT/OT eval and treat - AM BMP and Mag; goal K>4, Mag>2 - K this morning 3.8, redosed - Cardiology consulted, appreciate recommendations:  - Heparin  gtt and transition to Eliquis  when appropriate  - Loaded with ASA 324 mg, continue ASA 81 mg daily  - Continue carvedilol  25 mg BID  - Likely resume Entresto, decision pending  - Hold Jardiance d/t CABG to prevent eDKA  - Lasix  40 mg IV BID - CT surgery consulted, appreciate recommendations

## 2023-07-29 NOTE — Progress Notes (Signed)
 VASCULAR LAB    Pre CABG Dopplers have been performed.  See CV proc for preliminary results.   Joevanni Roddey, RVT 07/29/2023, 4:15 PM

## 2023-07-29 NOTE — Assessment & Plan Note (Signed)
 CIWAs discontinued with no signs of withdrawal since admission. - Thiamine , folate, and MVI daily - TOC consult placed

## 2023-07-29 NOTE — Plan of Care (Signed)

## 2023-07-29 NOTE — Plan of Care (Signed)

## 2023-07-29 NOTE — Plan of Care (Signed)
  Problem: Education: Goal: Ability to describe self-care measures that may prevent or decrease complications (Diabetes Survival Skills Education) will improve 07/29/2023 0700 by Yolande Hench, RN Outcome: Progressing 07/29/2023 0700 by Yolande Hench, RN Outcome: Progressing Goal: Individualized Educational Video(s) 07/29/2023 0700 by Yolande Hench, RN Outcome: Progressing 07/29/2023 0700 by Yolande Hench, RN Outcome: Progressing   Problem: Coping: Goal: Ability to adjust to condition or change in health will improve 07/29/2023 0700 by Yolande Hench, RN Outcome: Progressing 07/29/2023 0700 by Yolande Hench, RN Outcome: Progressing   Problem: Fluid Volume: Goal: Ability to maintain a balanced intake and output will improve 07/29/2023 0700 by Yolande Hench, RN Outcome: Progressing 07/29/2023 0700 by Yolande Hench, RN Outcome: Progressing

## 2023-07-29 NOTE — Assessment & Plan Note (Addendum)
 Stable - DuoNebs q6h PRN - Incruse Ellipta  (Spiriva Respimat formulary equivalent) daily -- will be switching back to home Spiriva after pharmacy review - Albuterol  neb prn - O2 sat goal 88-92%, BiPAP nightly for OSA - Recommended marijuana cessation or at least discontinuing smoking inhalation

## 2023-07-29 NOTE — Plan of Care (Signed)
  Problem: Education: Goal: Ability to describe self-care measures that may prevent or decrease complications (Diabetes Survival Skills Education) will improve 07/29/2023 0701 by Yolande Hench, RN Outcome: Progressing 07/29/2023 0700 by Yolande Hench, RN Outcome: Progressing 07/29/2023 0700 by Yolande Hench, RN Outcome: Progressing Goal: Individualized Educational Video(s) 07/29/2023 0701 by Yolande Hench, RN Outcome: Progressing 07/29/2023 0700 by Yolande Hench, RN Outcome: Progressing 07/29/2023 0700 by Yolande Hench, RN Outcome: Progressing   Problem: Health Behavior/Discharge Planning: Goal: Ability to identify and utilize available resources and services will improve 07/29/2023 0701 by Yolande Hench, RN Outcome: Progressing 07/29/2023 0700 by Yolande Hench, RN Outcome: Progressing 07/29/2023 0700 by Yolande Hench, RN Outcome: Progressing Goal: Ability to manage health-related needs will improve 07/29/2023 0701 by Yolande Hench, RN Outcome: Progressing 07/29/2023 0700 by Yolande Hench, RN Outcome: Progressing 07/29/2023 0700 by Yolande Hench, RN Outcome: Progressing

## 2023-07-29 NOTE — Progress Notes (Signed)
 2 Days Post-Op Procedure(s) (LRB): RIGHT/LEFT HEART CATH AND CORONARY ANGIOGRAPHY (N/A) Subjective: No complaints  Objective: Vital signs in last 24 hours: Temp:  [97.7 F (36.5 C)-98.3 F (36.8 C)] 98.1 F (36.7 C) (06/01 0900) Pulse Rate:  [64-69] 64 (06/01 0525) Cardiac Rhythm: Normal sinus rhythm;Bundle branch block (06/01 0700) Resp:  [16-18] 18 (06/01 1100) BP: (134-157)/(54-79) 134/79 (06/01 0900) SpO2:  [97 %-100 %] 97 % (06/01 0900) Weight:  [120.6 kg] 120.6 kg (06/01 0500)  Hemodynamic parameters for last 24 hours:    Intake/Output from previous day: 05/31 0701 - 06/01 0700 In: 281.1 [I.V.:281.1] Out: -  Intake/Output this shift: No intake/output data recorded.  General appearance: alert, cooperative, and no distress Neurologic: intact Heart: regular rate and rhythm  Lab Results: Recent Labs    07/28/23 0446 07/29/23 0450  WBC 6.8 7.4  HGB 14.0 14.1  HCT 41.1 41.2  PLT 210 226   BMET:  Recent Labs    07/28/23 0446 07/29/23 0450  NA 127* 130*  K 3.8 3.8  CL 93* 95*  CO2 28 26  GLUCOSE 127* 128*  BUN 18 14  CREATININE 0.90 0.79  CALCIUM  8.1* 8.7*    PT/INR:  Recent Labs    07/27/23 2024  LABPROT 12.7  INR 0.9   ABG    Component Value Date/Time   PHART 7.419 07/27/2023 0805   HCO3 29.9 (H) 07/27/2023 0805   HCO3 30.9 (H) 07/27/2023 0805   TCO2 31 07/27/2023 0805   TCO2 32 07/27/2023 0805   O2SAT 98 07/27/2023 0805   O2SAT 75 07/27/2023 0805   CBG (last 3)  Recent Labs    07/28/23 2029 07/29/23 0744 07/29/23 1139  GLUCAP 152* 151* 140*    Assessment/Plan: S/P Procedure(s) (LRB): RIGHT/LEFT HEART CATH AND CORONARY ANGIOGRAPHY (N/A) Severe multivessel CAD Plan for CABG + LAA clip tomorrow 2nd case All questions answered   LOS: 5 days    [name] 07/29/2023

## 2023-07-29 NOTE — Progress Notes (Signed)
 Daily Progress Note Intern Pager: [phone]  Patient name: [name]. Medical record number: [id number] Date of birth: 08-10-1950 Age: 73 y.o. Gender: male  Primary Care Provider: Administration, Veterans Consultants: Cardiology, cardiothoracic surgery Code Status: Full code  Pt Overview and Major Events to Date:  5/27 - Admitted, started IV Lasix  for diuresis 5/28 - Cardiology consulted, TEE with LV RWMAb, heparin  GTT initiated 3/30 -  L/R HC, CT surgery consulted for CABG eval  Assessment and Plan: Ronnel Zuercher is a 73 year old male with past medical history of type 2 diabetes, A-fib on Eliquis , HTN, HLD, RBBB, and OSA on CPAP admitted for AHRF-found to have NSTEMI.  Further cardiac evaluation revealed significant LAD stenosis and cardiology recommended consult to CT surgery for CABG.  Patient is awaiting CABG on Monday 6/2. Assessment & Plan NSTEMI (non-ST elevated myocardial infarction) [facility])  Cardiomyopathy Cardiac Cath show severe multi-level carotid stenosis. CTS following and considering CABG with left artrial appendage clip tentatively for Monday 07/30/23; recommend optimization of medical management including diuresis before procedure. Deemed poor candidate for PCI per cardiology. - NPO at [facility] for CABG tomorrow - Strict I&Os, daily weights, and heart healthy diet - Continuous cardiac monitoring and pulse oximetry - PT/OT eval and treat - AM BMP and Mag; goal K>4, Mag>2 - K this morning 3.8, redosed - Cardiology consulted, appreciate recommendations:  - Heparin  gtt and transition to Eliquis  when appropriate  - Loaded with ASA 324 mg, continue ASA 81 mg daily  - Continue carvedilol  25 mg BID  - Likely resume Entresto, decision pending  - Hold Jardiance d/t CABG to prevent eDKA  - Lasix  40 mg IV BID - CT surgery consulted, appreciate recommendations Alcohol use  Hyponatremia CIWAs discontinued with no signs of withdrawal since admission. - Thiamine ,  folate, and MVI daily - TOC consult placed COPD (chronic obstructive pulmonary disease) (HCC) Stable - DuoNebs q6h PRN - Incruse Ellipta  (Spiriva Respimat formulary equivalent) daily -- will be switching back to home Spiriva after pharmacy review - Albuterol  neb prn - O2 sat goal 88-92%, BiPAP nightly for OSA - Recommended marijuana cessation or at least discontinuing smoking inhalation T2DM (type 2 diabetes mellitus) (HCC) Last A1c 6.2 this admission. Currently on 20U LAI with adequate control - CBGs ACHS (does have CGM, but per pharmacy prefer to still do CBGs) - Resistant SSI - Carb modified diet - Continue LAI 20 units daily Chronic health problem A-fib: Eliquis  5 mg BID held for heparin  gtt, 44% burden Afib on monitor outpatient and on longterm AC Insomnia: Continue home trazodone  100 mg QHS and hydroxyzine  25 mg BID PRN HTN: As per NSTEMI above HLD: Not on ezetimibe and statin due to intolerance; LDL 154 and may need Repatha outpatient OSA: BiPAP per RT nightly CKD2: Recently diagnosed, Cr stable on admission Allergies: Fluticasone  daily, olopatadine  eye drops BID, cromolyn  drops QID  FEN/GI: Heart healthy diet PPx: Heparin  gtt Dispo: Pending CABG  Subjective:  Patient is doing well this morning.  NAEON.  Awaiting CABG tomorrow.  Objective: Temp:  [97.7 F (36.5 C)-98.3 F (36.8 C)] 98.3 F (36.8 C) (06/01 0036) Pulse Rate:  [59-69] 64 (06/01 0525) Resp:  [16-18] 18 (06/01 0525) BP: (137-157)/(54-66) 143/61 (06/01 0525) SpO2:  [97 %-100 %] 98 % (06/01 0525) Weight:  [120.6 kg] 120.6 kg (06/01 0500) Physical Exam: General: Awake and Alert in NAD HEENT: NCAT. Sclera anicteric. No rhinorrhea. Cardiovascular: RRR. No M/R/G Respiratory: CTAB, normal WOB on RA. No wheezing, crackles,  rhonchi, or diminished breath sounds. Abdomen: Soft, non-tender, non-distended. Bowel sounds normoactive Extremities: Able to move all extremities. No BLE edema, no deformities or  significant joint findings. Skin: Warm and dry. No abrasions or rashes noted. Neuro: A&Ox3. No focal neurological deficits.  Laboratory: Most recent CBC Lab Results  Component Value Date   WBC 7.4 07/29/2023   HGB 14.1 07/29/2023   HCT 41.2 07/29/2023   MCV 87.1 07/29/2023   PLT 226 07/29/2023   Most recent BMP    Latest Ref Rng & Units 07/29/2023    4:50 AM  BMP  Glucose 70 - 99 mg/dL 161   BUN 8 - 23 mg/dL 14   Creatinine 0.96 - 1.24 mg/dL 0.45   Sodium 409 - 811 mmol/L 130   Potassium 3.5 - 5.1 mmol/L 3.8   Chloride 98 - 111 mmol/L 95   CO2 22 - 32 mmol/L 26   Calcium  8.9 - 10.3 mg/dL 8.7     Imaging/Diagnostic Tests: No new imaging.  Clyda Dark, DO 07/29/2023, 7:30 AM  PGY-1, [facility] FPTS Intern pager: [phone], text pages welcome Secure chat group [facility] Teaching Service

## 2023-07-29 NOTE — Assessment & Plan Note (Signed)
 A-fib: Eliquis  5 mg BID held for heparin  gtt, 44% burden Afib on monitor outpatient and on longterm AC Insomnia: Continue home trazodone  100 mg QHS and hydroxyzine  25 mg BID PRN HTN: As per NSTEMI above HLD: Not on ezetimibe and statin due to intolerance; LDL 154 and may need Repatha outpatient OSA: BiPAP per RT nightly CKD2: Recently diagnosed, Cr stable on admission Allergies: Fluticasone  daily, olopatadine  eye drops BID, cromolyn  drops QID

## 2023-07-30 ENCOUNTER — Inpatient Hospital Stay (HOSPITAL_COMMUNITY): Admitting: Anesthesiology

## 2023-07-30 ENCOUNTER — Inpatient Hospital Stay (HOSPITAL_COMMUNITY)

## 2023-07-30 ENCOUNTER — Inpatient Hospital Stay (HOSPITAL_COMMUNITY)
Admission: EM | Disposition: A | Discharge status: Skilled Nursing Facility | Payer: Self-pay | Source: Home / Self Care | Attending: Thoracic Surgery (Cardiothoracic Vascular Surgery)

## 2023-07-30 ENCOUNTER — Encounter (HOSPITAL_COMMUNITY): Payer: Self-pay | Admitting: Family Medicine

## 2023-07-30 DIAGNOSIS — I2510 Atherosclerotic heart disease of native coronary artery without angina pectoris: Secondary | ICD-10-CM | POA: Diagnosis not present

## 2023-07-30 DIAGNOSIS — E1159 Type 2 diabetes mellitus with other circulatory complications: Secondary | ICD-10-CM | POA: Diagnosis not present

## 2023-07-30 DIAGNOSIS — I480 Paroxysmal atrial fibrillation: Secondary | ICD-10-CM

## 2023-07-30 DIAGNOSIS — I214 Non-ST elevation (NSTEMI) myocardial infarction: Secondary | ICD-10-CM | POA: Diagnosis not present

## 2023-07-30 DIAGNOSIS — J449 Chronic obstructive pulmonary disease, unspecified: Secondary | ICD-10-CM | POA: Diagnosis not present

## 2023-07-30 DIAGNOSIS — I255 Ischemic cardiomyopathy: Secondary | ICD-10-CM

## 2023-07-30 DIAGNOSIS — I10 Essential (primary) hypertension: Secondary | ICD-10-CM | POA: Diagnosis not present

## 2023-07-30 DIAGNOSIS — Z87891 Personal history of nicotine dependence: Secondary | ICD-10-CM | POA: Diagnosis not present

## 2023-07-30 DIAGNOSIS — E1169 Type 2 diabetes mellitus with other specified complication: Secondary | ICD-10-CM | POA: Diagnosis not present

## 2023-07-30 DIAGNOSIS — I152 Hypertension secondary to endocrine disorders: Secondary | ICD-10-CM

## 2023-07-30 DIAGNOSIS — Z951 Presence of aortocoronary bypass graft: Secondary | ICD-10-CM

## 2023-07-30 HISTORY — PX: CLIPPING OF ATRIAL APPENDAGE: SHX5773

## 2023-07-30 HISTORY — PX: CORONARY ARTERY BYPASS GRAFT: SHX141

## 2023-07-30 HISTORY — PX: INTRAOPERATIVE TRANSESOPHAGEAL ECHOCARDIOGRAM: SHX5062

## 2023-07-30 LAB — CBC
HCT: 45.2 % (ref 39.0–52.0)
HCT: 34.1 % — ABNORMAL LOW (ref 39.0–52.0)
Hemoglobin: 14.9 g/dL (ref 13.0–17.0)
Hemoglobin: 11.5 g/dL — ABNORMAL LOW (ref 13.0–17.0)
MCH: 29.2 pg (ref 26.0–34.0)
MCH: 30.2 pg (ref 26.0–34.0)
MCHC: 33 g/dL (ref 30.0–36.0)
MCHC: 33.7 g/dL (ref 30.0–36.0)
MCV: 88.6 fL (ref 80.0–100.0)
MCV: 89.5 fL (ref 80.0–100.0)
Platelets: 267 10*3/uL (ref 150–400)
Platelets: 174 10*3/uL (ref 150–400)
RBC: 5.1 MIL/uL (ref 4.22–5.81)
RBC: 3.81 MIL/uL — ABNORMAL LOW (ref 4.22–5.81)
RDW: 13.3 % (ref 11.5–15.5)
RDW: 13.5 % (ref 11.5–15.5)
WBC: 8 10*3/uL (ref 4.0–10.5)
WBC: 7.1 10*3/uL (ref 4.0–10.5)
nRBC: 0 % (ref 0.0–0.2)
nRBC: 0 % (ref 0.0–0.2)

## 2023-07-30 LAB — POCT I-STAT, CHEM 8
BUN: 15 mg/dL (ref 8–23)
BUN: 13 mg/dL (ref 8–23)
BUN: 15 mg/dL (ref 8–23)
BUN: 14 mg/dL (ref 8–23)
BUN: 15 mg/dL (ref 8–23)
Calcium, Ion: 1.26 mmol/L (ref 1.15–1.40)
Calcium, Ion: 1.03 mmol/L — ABNORMAL LOW (ref 1.15–1.40)
Calcium, Ion: 1.22 mmol/L (ref 1.15–1.40)
Calcium, Ion: 1.11 mmol/L — ABNORMAL LOW (ref 1.15–1.40)
Calcium, Ion: 1.25 mmol/L (ref 1.15–1.40)
Chloride: 94 mmol/L — ABNORMAL LOW (ref 98–111)
Chloride: 94 mmol/L — ABNORMAL LOW (ref 98–111)
Chloride: 96 mmol/L — ABNORMAL LOW (ref 98–111)
Chloride: 97 mmol/L — ABNORMAL LOW (ref 98–111)
Chloride: 98 mmol/L (ref 98–111)
Creatinine, Ser: 0.9 mg/dL (ref 0.61–1.24)
Creatinine, Ser: 0.7 mg/dL (ref 0.61–1.24)
Creatinine, Ser: 0.8 mg/dL (ref 0.61–1.24)
Creatinine, Ser: 0.8 mg/dL (ref 0.61–1.24)
Creatinine, Ser: 0.8 mg/dL (ref 0.61–1.24)
Glucose, Bld: 121 mg/dL — ABNORMAL HIGH (ref 70–99)
Glucose, Bld: 116 mg/dL — ABNORMAL HIGH (ref 70–99)
Glucose, Bld: 129 mg/dL — ABNORMAL HIGH (ref 70–99)
Glucose, Bld: 119 mg/dL — ABNORMAL HIGH (ref 70–99)
Glucose, Bld: 153 mg/dL — ABNORMAL HIGH (ref 70–99)
HCT: 37 % — ABNORMAL LOW (ref 39.0–52.0)
HCT: 29 % — ABNORMAL LOW (ref 39.0–52.0)
HCT: 37 % — ABNORMAL LOW (ref 39.0–52.0)
HCT: 29 % — ABNORMAL LOW (ref 39.0–52.0)
HCT: 32 % — ABNORMAL LOW (ref 39.0–52.0)
Hemoglobin: 12.6 g/dL — ABNORMAL LOW (ref 13.0–17.0)
Hemoglobin: 12.6 g/dL — ABNORMAL LOW (ref 13.0–17.0)
Hemoglobin: 9.9 g/dL — ABNORMAL LOW (ref 13.0–17.0)
Hemoglobin: 9.9 g/dL — ABNORMAL LOW (ref 13.0–17.0)
Hemoglobin: 10.9 g/dL — ABNORMAL LOW (ref 13.0–17.0)
Potassium: 3.9 mmol/L (ref 3.5–5.1)
Potassium: 3.9 mmol/L (ref 3.5–5.1)
Potassium: 4.2 mmol/L (ref 3.5–5.1)
Potassium: 4.4 mmol/L (ref 3.5–5.1)
Potassium: 3.8 mmol/L (ref 3.5–5.1)
Sodium: 133 mmol/L — ABNORMAL LOW (ref 135–145)
Sodium: 131 mmol/L — ABNORMAL LOW (ref 135–145)
Sodium: 134 mmol/L — ABNORMAL LOW (ref 135–145)
Sodium: 132 mmol/L — ABNORMAL LOW (ref 135–145)
Sodium: 134 mmol/L — ABNORMAL LOW (ref 135–145)
TCO2: 30 mmol/L (ref 22–32)
TCO2: 28 mmol/L (ref 22–32)
TCO2: 32 mmol/L (ref 22–32)
TCO2: 30 mmol/L (ref 22–32)
TCO2: 27 mmol/L (ref 22–32)

## 2023-07-30 LAB — POCT I-STAT 7, (LYTES, BLD GAS, ICA,H+H)
Acid-Base Excess: 2 mmol/L (ref 0.0–2.0)
Acid-Base Excess: 2 mmol/L (ref 0.0–2.0)
Acid-Base Excess: 2 mmol/L (ref 0.0–2.0)
Acid-base deficit: 2 mmol/L (ref 0.0–2.0)
Bicarbonate: 28.4 mmol/L — ABNORMAL HIGH (ref 20.0–28.0)
Bicarbonate: 27.2 mmol/L (ref 20.0–28.0)
Bicarbonate: 28.1 mmol/L — ABNORMAL HIGH (ref 20.0–28.0)
Bicarbonate: 24.2 mmol/L (ref 20.0–28.0)
Calcium, Ion: 1.25 mmol/L (ref 1.15–1.40)
Calcium, Ion: 1.02 mmol/L — ABNORMAL LOW (ref 1.15–1.40)
Calcium, Ion: 1.3 mmol/L (ref 1.15–1.40)
Calcium, Ion: 1.27 mmol/L (ref 1.15–1.40)
HCT: 38 % — ABNORMAL LOW (ref 39.0–52.0)
HCT: 31 % — ABNORMAL LOW (ref 39.0–52.0)
HCT: 30 % — ABNORMAL LOW (ref 39.0–52.0)
HCT: 31 % — ABNORMAL LOW (ref 39.0–52.0)
Hemoglobin: 12.9 g/dL — ABNORMAL LOW (ref 13.0–17.0)
Hemoglobin: 10.5 g/dL — ABNORMAL LOW (ref 13.0–17.0)
Hemoglobin: 10.2 g/dL — ABNORMAL LOW (ref 13.0–17.0)
Hemoglobin: 10.5 g/dL — ABNORMAL LOW (ref 13.0–17.0)
O2 Saturation: 100 %
O2 Saturation: 100 %
O2 Saturation: 100 %
O2 Saturation: 94 %
Patient temperature: 36.4
Potassium: 3.9 mmol/L (ref 3.5–5.1)
Potassium: 4.3 mmol/L (ref 3.5–5.1)
Potassium: 3.8 mmol/L (ref 3.5–5.1)
Potassium: 3.9 mmol/L (ref 3.5–5.1)
Sodium: 132 mmol/L — ABNORMAL LOW (ref 135–145)
Sodium: 134 mmol/L — ABNORMAL LOW (ref 135–145)
Sodium: 134 mmol/L — ABNORMAL LOW (ref 135–145)
Sodium: 135 mmol/L (ref 135–145)
TCO2: 30 mmol/L (ref 22–32)
TCO2: 29 mmol/L (ref 22–32)
TCO2: 30 mmol/L (ref 22–32)
TCO2: 26 mmol/L (ref 22–32)
pCO2 arterial: 52.5 mmHg — ABNORMAL HIGH (ref 32–48)
pCO2 arterial: 44.5 mmHg (ref 32–48)
pCO2 arterial: 51.9 mmHg — ABNORMAL HIGH (ref 32–48)
pCO2 arterial: 44 mmHg (ref 32–48)
pH, Arterial: 7.341 — ABNORMAL LOW (ref 7.35–7.45)
pH, Arterial: 7.393 (ref 7.35–7.45)
pH, Arterial: 7.342 — ABNORMAL LOW (ref 7.35–7.45)
pH, Arterial: 7.345 — ABNORMAL LOW (ref 7.35–7.45)
pO2, Arterial: 334 mmHg — ABNORMAL HIGH (ref 83–108)
pO2, Arterial: 500 mmHg — ABNORMAL HIGH (ref 83–108)
pO2, Arterial: 281 mmHg — ABNORMAL HIGH (ref 83–108)
pO2, Arterial: 73 mmHg — ABNORMAL LOW (ref 83–108)

## 2023-07-30 LAB — SURGICAL PCR SCREEN
MRSA, PCR: NEGATIVE
Staphylococcus aureus: NEGATIVE

## 2023-07-30 LAB — GLUCOSE, CAPILLARY
Glucose-Capillary: 111 mg/dL — ABNORMAL HIGH (ref 70–99)
Glucose-Capillary: 113 mg/dL — ABNORMAL HIGH (ref 70–99)
Glucose-Capillary: 120 mg/dL — ABNORMAL HIGH (ref 70–99)
Glucose-Capillary: 134 mg/dL — ABNORMAL HIGH (ref 70–99)
Glucose-Capillary: 145 mg/dL — ABNORMAL HIGH (ref 70–99)
Glucose-Capillary: 149 mg/dL — ABNORMAL HIGH (ref 70–99)
Glucose-Capillary: 154 mg/dL — ABNORMAL HIGH (ref 70–99)

## 2023-07-30 LAB — PLATELET COUNT: Platelets: 188 10*3/uL (ref 150–400)

## 2023-07-30 LAB — PROTIME-INR
INR: 1.4 — ABNORMAL HIGH (ref 0.8–1.2)
Prothrombin Time: 17 s — ABNORMAL HIGH (ref 11.4–15.2)

## 2023-07-30 LAB — HEMOGLOBIN AND HEMATOCRIT, BLOOD
HCT: 31.3 % — ABNORMAL LOW (ref 39.0–52.0)
Hemoglobin: 10.9 g/dL — ABNORMAL LOW (ref 13.0–17.0)

## 2023-07-30 LAB — HEPARIN LEVEL (UNFRACTIONATED): Heparin Unfractionated: 0.45 [IU]/mL (ref 0.30–0.70)

## 2023-07-30 LAB — APTT: aPTT: 31 s (ref 24–36)

## 2023-07-30 LAB — VAS US DOPPLER PRE CABG

## 2023-07-30 SURGERY — CORONARY ARTERY BYPASS GRAFTING (CABG)
Anesthesia: General | Site: Chest

## 2023-07-30 MED ORDER — DEXMEDETOMIDINE HCL IN NACL 200 MCG/50ML IV SOLN
INTRAVENOUS | Status: AC
  Filled 2023-07-30: qty 50

## 2023-07-30 MED ORDER — PROPOFOL 10 MG/ML IV BOLUS
INTRAVENOUS | Status: DC | PRN
  Administered 2023-07-30: 80 mg via INTRAVENOUS
  Administered 2023-07-30: 30 mg via INTRAVENOUS

## 2023-07-30 MED ORDER — ACETAMINOPHEN 500 MG PO TABS
1000.0000 mg | ORAL_TABLET | Freq: Four times a day (QID) | ORAL | Status: AC
  Administered 2023-07-31 – 2023-08-04 (×17): 1000 mg via ORAL
  Filled 2023-07-30 (×19): qty 2

## 2023-07-30 MED ORDER — FENTANYL CITRATE (PF) 250 MCG/5ML IJ SOLN
INTRAMUSCULAR | Status: AC
  Filled 2023-07-30: qty 5

## 2023-07-30 MED ORDER — SODIUM CHLORIDE 0.9% FLUSH
3.0000 mL | Freq: Two times a day (BID) | INTRAVENOUS | Status: DC
  Administered 2023-07-30: 10 mL via INTRAVENOUS

## 2023-07-30 MED ORDER — POTASSIUM CHLORIDE 10 MEQ/50ML IV SOLN
10.0000 meq | INTRAVENOUS | Status: AC
  Administered 2023-07-30 (×3): 10 meq via INTRAVENOUS

## 2023-07-30 MED ORDER — ROCURONIUM BROMIDE 10 MG/ML (PF) SYRINGE
PREFILLED_SYRINGE | INTRAVENOUS | Status: DC | PRN
  Administered 2023-07-30: 50 mg via INTRAVENOUS
  Administered 2023-07-30: 100 mg via INTRAVENOUS
  Administered 2023-07-30: 20 mg via INTRAVENOUS
  Administered 2023-07-30: 30 mg via INTRAVENOUS
  Administered 2023-07-30: 50 mg via INTRAVENOUS

## 2023-07-30 MED ORDER — PHENYLEPHRINE HCL-NACL 20-0.9 MG/250ML-% IV SOLN
0.0000 ug/min | INTRAVENOUS | Status: DC
  Filled 2023-07-30: qty 250

## 2023-07-30 MED ORDER — SODIUM CHLORIDE 0.9% FLUSH: 3.0000 mL | INTRAVENOUS | Status: DC | PRN

## 2023-07-30 MED ORDER — CALCIUM CHLORIDE 10 % IV SOLN
INTRAVENOUS | Status: DC | PRN
Start: 2023-07-30 — End: 2023-07-30
  Administered 2023-07-30: 1 g via INTRAVENOUS

## 2023-07-30 MED ORDER — PLASMA-LYTE A IV SOLN: INTRAVENOUS | Status: DC | PRN

## 2023-07-30 MED ORDER — ALBUMIN HUMAN 5 % IV SOLN: INTRAVENOUS | Status: DC | PRN

## 2023-07-30 MED ORDER — ACETAMINOPHEN 160 MG/5ML PO SOLN
650.0000 mg | Freq: Once | ORAL | Status: AC
  Administered 2023-07-30: 650 mg
  Filled 2023-07-30: qty 20.3

## 2023-07-30 MED ORDER — SODIUM CHLORIDE (PF) 0.9 % IJ SOLN: OROMUCOSAL | Status: DC | PRN

## 2023-07-30 MED ORDER — EPINEPHRINE PF 1 MG/ML IJ SOLN
INTRAMUSCULAR | Status: DC | PRN
  Administered 2023-07-30 (×3): .1 mg via INTRAVENOUS

## 2023-07-30 MED ORDER — FENTANYL CITRATE (PF) 250 MCG/5ML IJ SOLN
INTRAMUSCULAR | Status: DC | PRN
  Administered 2023-07-30 (×3): 100 ug via INTRAVENOUS
  Administered 2023-07-30: 125 ug via INTRAVENOUS
  Administered 2023-07-30: 300 ug via INTRAVENOUS
  Administered 2023-07-30 (×2): 100 ug via INTRAVENOUS
  Administered 2023-07-30: 225 ug via INTRAVENOUS
  Administered 2023-07-30: 100 ug via INTRAVENOUS

## 2023-07-30 MED ORDER — ACETAMINOPHEN 160 MG/5ML PO SOLN
1000.0000 mg | Freq: Four times a day (QID) | ORAL | Status: AC
  Administered 2023-07-30: 1000 mg
  Filled 2023-07-30: qty 40.6

## 2023-07-30 MED ORDER — CHLORHEXIDINE GLUCONATE CLOTH 2 % EX PADS
6.0000 | MEDICATED_PAD | Freq: Every day | CUTANEOUS | Status: DC
  Administered 2023-07-30 – 2023-08-03 (×5): 6 via TOPICAL

## 2023-07-30 MED ORDER — LACTATED RINGERS IV SOLN
INTRAVENOUS | Status: DC | PRN
Start: 2023-07-30 — End: 2023-07-30

## 2023-07-30 MED ORDER — HEMOSTATIC AGENTS (NO CHARGE) OPTIME
TOPICAL | Status: DC | PRN
  Administered 2023-07-30: 1 via TOPICAL

## 2023-07-30 MED ORDER — INSULIN REGULAR(HUMAN) IN NACL 100-0.9 UT/100ML-% IV SOLN: INTRAVENOUS | Status: DC

## 2023-07-30 MED ORDER — NITROGLYCERIN IN D5W 200-5 MCG/ML-% IV SOLN
0.0000 ug/min | INTRAVENOUS | Status: DC
  Administered 2023-07-30: 0 ug/min via INTRAVENOUS

## 2023-07-30 MED ORDER — MILRINONE LACTATE IN DEXTROSE 20-5 MG/100ML-% IV SOLN
0.1250 ug/kg/min | INTRAVENOUS | Status: DC
Start: 2023-07-30 — End: 2023-08-01
  Administered 2023-07-31: 0.125 ug/kg/min via INTRAVENOUS
  Administered 2023-07-31: 0.25 ug/kg/min via INTRAVENOUS
  Filled 2023-07-30 (×2): qty 100

## 2023-07-30 MED ORDER — ALBUMIN HUMAN 5 % IV SOLN
250.0000 mL | INTRAVENOUS | Status: DC | PRN
  Administered 2023-07-30 – 2023-07-31 (×2): 12.5 g via INTRAVENOUS

## 2023-07-30 MED ORDER — PROPOFOL 10 MG/ML IV BOLUS
INTRAVENOUS | Status: AC
Start: 2023-07-30 — End: ?
  Filled 2023-07-30: qty 20

## 2023-07-30 MED ORDER — EPINEPHRINE HCL 5 MG/250ML IV SOLN IN NS
INTRAVENOUS | Status: DC | PRN
Start: 2023-07-30 — End: 2023-07-30
  Administered 2023-07-30: 2 ug/min via INTRAVENOUS

## 2023-07-30 MED ORDER — CHLORHEXIDINE GLUCONATE 0.12 % MT SOLN
15.0000 mL | Freq: Once | OROMUCOSAL | Status: AC
  Administered 2023-07-30: 15 mL via OROMUCOSAL
  Filled 2023-07-30: qty 15

## 2023-07-30 MED ORDER — LACTATED RINGERS IV SOLN: INTRAVENOUS | Status: DC | PRN

## 2023-07-30 MED ORDER — SODIUM CHLORIDE 0.9% FLUSH
3.0000 mL | Freq: Two times a day (BID) | INTRAVENOUS | Status: DC
  Administered 2023-07-30: 3 mL via INTRAVENOUS

## 2023-07-30 MED ORDER — MIDAZOLAM HCL (PF) 10 MG/2ML IJ SOLN
INTRAMUSCULAR | Status: AC
  Filled 2023-07-30: qty 2

## 2023-07-30 MED ORDER — TRAMADOL HCL 50 MG PO TABS
50.0000 mg | ORAL_TABLET | ORAL | Status: DC | PRN
  Administered 2023-07-31 – 2023-08-01 (×4): 100 mg via ORAL
  Filled 2023-07-30 (×4): qty 2

## 2023-07-30 MED ORDER — METOPROLOL TARTRATE 25 MG/10 ML ORAL SUSPENSION: 12.5000 mg | Freq: Two times a day (BID) | ORAL | Status: DC

## 2023-07-30 MED ORDER — EPHEDRINE SULFATE-NACL 50-0.9 MG/10ML-% IV SOSY
PREFILLED_SYRINGE | INTRAVENOUS | Status: DC | PRN
  Administered 2023-07-30: 10 mg via INTRAVENOUS

## 2023-07-30 MED ORDER — DOCUSATE SODIUM 100 MG PO CAPS
200.0000 mg | ORAL_CAPSULE | Freq: Every day | ORAL | Status: DC
  Administered 2023-07-31 – 2023-08-10 (×11): 200 mg via ORAL
  Filled 2023-07-30 (×11): qty 2

## 2023-07-30 MED ORDER — SODIUM CHLORIDE 0.9% FLUSH: 3.0000 mL | Freq: Two times a day (BID) | INTRAVENOUS | Status: DC

## 2023-07-30 MED ORDER — MAGNESIUM SULFATE 4 GM/100ML IV SOLN
4.0000 g | Freq: Once | INTRAVENOUS | Status: AC
Start: 2023-07-30 — End: 2023-07-31
  Administered 2023-07-30: 4 g via INTRAVENOUS
  Filled 2023-07-30: qty 100

## 2023-07-30 MED ORDER — ASPIRIN 81 MG PO CHEW
324.0000 mg | CHEWABLE_TABLET | Freq: Once | ORAL | Status: AC
  Administered 2023-07-30: 324 mg via ORAL
  Filled 2023-07-30: qty 4

## 2023-07-30 MED ORDER — ASPIRIN 81 MG PO CHEW: 324.0000 mg | CHEWABLE_TABLET | Freq: Every day | ORAL | Status: DC

## 2023-07-30 MED ORDER — PROTAMINE SULFATE 10 MG/ML IV SOLN
INTRAVENOUS | Status: DC | PRN
  Administered 2023-07-30: 460 mg via INTRAVENOUS
  Administered 2023-07-30: 20 mg via INTRAVENOUS

## 2023-07-30 MED ORDER — SODIUM CHLORIDE 0.9 % IV SOLN: INTRAVENOUS | Status: DC | PRN

## 2023-07-30 MED ORDER — OXYCODONE HCL 5 MG PO TABS
5.0000 mg | ORAL_TABLET | ORAL | Status: DC | PRN
  Administered 2023-08-01: 10 mg via ORAL
  Administered 2023-08-02: 5 mg via ORAL
  Administered 2023-08-02 – 2023-08-03 (×5): 10 mg via ORAL
  Administered 2023-08-03 (×2): 5 mg via ORAL
  Administered 2023-08-04: 10 mg via ORAL
  Administered 2023-08-04: 9 mg via ORAL
  Administered 2023-08-06 – 2023-08-07 (×4): 10 mg via ORAL
  Filled 2023-07-30 (×4): qty 2
  Filled 2023-07-30 (×2): qty 1
  Filled 2023-07-30 (×7): qty 2
  Filled 2023-07-30: qty 1
  Filled 2023-07-30 (×2): qty 2

## 2023-07-30 MED ORDER — PANTOPRAZOLE SODIUM 40 MG PO TBEC
40.0000 mg | DELAYED_RELEASE_TABLET | Freq: Every day | ORAL | Status: DC
  Administered 2023-08-01 – 2023-08-10 (×10): 40 mg via ORAL
  Filled 2023-07-30 (×10): qty 1

## 2023-07-30 MED ORDER — DEXMEDETOMIDINE HCL IN NACL 400 MCG/100ML IV SOLN
0.0000 ug/kg/h | INTRAVENOUS | Status: DC
  Administered 2023-07-30: 0.7 ug/kg/h via INTRAVENOUS
  Administered 2023-07-30: 0.6 ug/kg/h via INTRAVENOUS
  Filled 2023-07-30: qty 100

## 2023-07-30 MED ORDER — CLEVIDIPINE BUTYRATE 0.5 MG/ML IV EMUL
INTRAVENOUS | Status: DC | PRN
Start: 2023-07-30 — End: 2023-07-30
  Administered 2023-07-30: 3 mg/h via INTRAVENOUS

## 2023-07-30 MED ORDER — SODIUM CHLORIDE 0.9 % IV SOLN: INTRAVENOUS | Status: AC

## 2023-07-30 MED ORDER — BISACODYL 5 MG PO TBEC
10.0000 mg | DELAYED_RELEASE_TABLET | Freq: Every day | ORAL | Status: DC
  Administered 2023-07-31 – 2023-08-10 (×9): 10 mg via ORAL
  Filled 2023-07-30 (×10): qty 2

## 2023-07-30 MED ORDER — ASPIRIN 325 MG PO TBEC
325.0000 mg | DELAYED_RELEASE_TABLET | Freq: Every day | ORAL | Status: DC
  Administered 2023-07-31: 325 mg via ORAL
  Filled 2023-07-30 (×2): qty 1

## 2023-07-30 MED ORDER — NITROGLYCERIN IN D5W 200-5 MCG/ML-% IV SOLN
INTRAVENOUS | Status: DC | PRN
Start: 2023-07-30 — End: 2023-07-30
  Administered 2023-07-30: 20 ug/min via INTRAVENOUS

## 2023-07-30 MED ORDER — BISACODYL 10 MG RE SUPP
10.0000 mg | Freq: Every day | RECTAL | Status: DC
  Filled 2023-07-30: qty 1

## 2023-07-30 MED ORDER — MIDAZOLAM HCL (PF) 5 MG/ML IJ SOLN
INTRAMUSCULAR | Status: DC | PRN
  Administered 2023-07-30 (×2): 2 mg via INTRAVENOUS

## 2023-07-30 MED ORDER — DEXTROSE 50 % IV SOLN: 0.0000 mL | INTRAVENOUS | Status: DC | PRN

## 2023-07-30 MED ORDER — ONDANSETRON HCL 4 MG/2ML IJ SOLN
4.0000 mg | Freq: Four times a day (QID) | INTRAMUSCULAR | Status: DC | PRN
  Administered 2023-07-31 – 2023-08-06 (×3): 4 mg via INTRAVENOUS
  Filled 2023-07-30 (×5): qty 2

## 2023-07-30 MED ORDER — 0.9 % SODIUM CHLORIDE (POUR BTL) OPTIME
TOPICAL | Status: DC | PRN
  Administered 2023-07-30: 5000 mL

## 2023-07-30 MED ORDER — PANTOPRAZOLE SODIUM 40 MG IV SOLR
40.0000 mg | Freq: Every day | INTRAVENOUS | Status: AC
  Administered 2023-07-30 – 2023-07-31 (×2): 40 mg via INTRAVENOUS
  Filled 2023-07-30 (×2): qty 10

## 2023-07-30 MED ORDER — HEPARIN SODIUM (PORCINE) 1000 UNIT/ML IJ SOLN
INTRAMUSCULAR | Status: AC
  Filled 2023-07-30: qty 1

## 2023-07-30 MED ORDER — METOPROLOL TARTRATE 5 MG/5ML IV SOLN: 2.5000 mg | INTRAVENOUS | Status: DC | PRN

## 2023-07-30 MED ORDER — CHLORHEXIDINE GLUCONATE 0.12 % MT SOLN
15.0000 mL | OROMUCOSAL | Status: AC
  Administered 2023-07-30: 15 mL via OROMUCOSAL
  Filled 2023-07-30: qty 15

## 2023-07-30 MED ORDER — LACTATED RINGERS IV SOLN: INTRAVENOUS | Status: DC

## 2023-07-30 MED ORDER — ORAL CARE MOUTH RINSE: 15.0000 mL | Freq: Once | OROMUCOSAL | Status: AC

## 2023-07-30 MED ORDER — PROPOFOL 10 MG/ML IV BOLUS
INTRAVENOUS | Status: AC
  Filled 2023-07-30: qty 20

## 2023-07-30 MED ORDER — METOPROLOL TARTRATE 12.5 MG HALF TABLET
12.5000 mg | ORAL_TABLET | Freq: Two times a day (BID) | ORAL | Status: DC
  Administered 2023-07-31 (×2): 12.5 mg via ORAL
  Filled 2023-07-30 (×3): qty 1

## 2023-07-30 MED ORDER — CEFAZOLIN SODIUM-DEXTROSE 2-4 GM/100ML-% IV SOLN
2.0000 g | Freq: Three times a day (TID) | INTRAVENOUS | Status: DC
  Administered 2023-07-30 – 2023-08-01 (×5): 2 g via INTRAVENOUS
  Filled 2023-07-30 (×6): qty 100

## 2023-07-30 MED ORDER — PHENYLEPHRINE 80 MCG/ML (10ML) SYRINGE FOR IV PUSH (FOR BLOOD PRESSURE SUPPORT)
PREFILLED_SYRINGE | INTRAVENOUS | Status: DC | PRN
  Administered 2023-07-30: 160 ug via INTRAVENOUS
  Administered 2023-07-30 (×2): 80 ug via INTRAVENOUS

## 2023-07-30 MED ORDER — HEPARIN SODIUM (PORCINE) 1000 UNIT/ML IJ SOLN
INTRAMUSCULAR | Status: DC | PRN
  Administered 2023-07-30: 46000 [IU] via INTRAVENOUS
  Administered 2023-07-30: 2000 [IU] via INTRAVENOUS

## 2023-07-30 MED ORDER — HEPARIN SODIUM (PORCINE) 1000 UNIT/ML IJ SOLN
INTRAMUSCULAR | Status: AC
Start: 2023-07-30 — End: ?
  Filled 2023-07-30: qty 20

## 2023-07-30 MED ORDER — MIDAZOLAM HCL 2 MG/2ML IJ SOLN
2.0000 mg | INTRAMUSCULAR | Status: DC | PRN
  Administered 2023-07-30: 2 mg via INTRAVENOUS
  Filled 2023-07-30: qty 2

## 2023-07-30 MED ORDER — MORPHINE SULFATE (PF) 2 MG/ML IV SOLN
1.0000 mg | INTRAVENOUS | Status: DC | PRN
  Administered 2023-07-31 (×2): 2 mg via INTRAVENOUS
  Administered 2023-07-31 (×3): 4 mg via INTRAVENOUS
  Administered 2023-07-31: 1 mg via INTRAVENOUS
  Filled 2023-07-30: qty 2
  Filled 2023-07-30 (×2): qty 1
  Filled 2023-07-30 (×2): qty 2
  Filled 2023-07-30: qty 1
  Filled 2023-07-30: qty 2

## 2023-07-30 MED ORDER — METOCLOPRAMIDE HCL 5 MG/ML IJ SOLN
10.0000 mg | Freq: Four times a day (QID) | INTRAMUSCULAR | Status: AC
  Administered 2023-07-30 – 2023-08-01 (×6): 10 mg via INTRAVENOUS
  Filled 2023-07-30 (×6): qty 2

## 2023-07-30 MED ORDER — EPINEPHRINE HCL 5 MG/250ML IV SOLN IN NS
2.0000 ug/min | INTRAVENOUS | Status: DC
  Administered 2023-07-31: 2.5 ug/min via INTRAVENOUS
  Filled 2023-07-30: qty 250

## 2023-07-30 MED ORDER — VANCOMYCIN HCL IN DEXTROSE 1-5 GM/200ML-% IV SOLN
1000.0000 mg | Freq: Once | INTRAVENOUS | Status: AC
  Administered 2023-07-31: 1000 mg via INTRAVENOUS
  Filled 2023-07-30: qty 200

## 2023-07-30 SURGICAL SUPPLY — 78 items
ADAPTER MULTI PERFUSION 15 (ADAPTER) ×3 IMPLANT
BAG DECANTER FOR FLEXI CONT (MISCELLANEOUS) ×3 IMPLANT
BLADE CLIPPER SURG (BLADE) ×3 IMPLANT
BLADE STERNUM SYSTEM 6 (BLADE) ×3 IMPLANT
BNDG ELASTIC 4INX 5YD STR LF (GAUZE/BANDAGES/DRESSINGS) IMPLANT
BNDG ELASTIC 6INX 5YD STR LF (GAUZE/BANDAGES/DRESSINGS) ×3 IMPLANT
BNDG GAUZE DERMACEA FLUFF 4 (GAUZE/BANDAGES/DRESSINGS) ×3 IMPLANT
CANISTER SUCTION 3000ML PPV (SUCTIONS) ×3 IMPLANT
CANNULA AORTIC ROOT 9FR (CANNULA) ×3 IMPLANT
CANNULA EZ GLIDE AORTIC 21FR (CANNULA) ×3 IMPLANT
CANNULA MC2 2 STG 36/46 CONN (CANNULA) IMPLANT
CANNULA VESSEL 3MM BLUNT TIP (CANNULA) ×9 IMPLANT
CATH ROBINSON RED A/P 18FR (CATHETERS) ×3 IMPLANT
CATH THORACIC 36FR (CATHETERS) ×3 IMPLANT
CATH THORACIC 36FR RT ANG (CATHETERS) ×3 IMPLANT
CLIP TI MEDIUM 24 (CLIP) IMPLANT
CLIP TI WIDE RED SMALL 24 (CLIP) IMPLANT
CNTNR URN SCR LID CUP LEK RST (MISCELLANEOUS) IMPLANT
CONTAINER PROTECT SURGISLUSH (MISCELLANEOUS) ×6 IMPLANT
DERMABOND ADVANCED .7 DNX12 (GAUZE/BANDAGES/DRESSINGS) IMPLANT
DRAPE SRG 135X102X78XABS (DRAPES) ×3 IMPLANT
DRAPE WARM FLUID 44X44 (DRAPES) ×3 IMPLANT
DRSG COVADERM 4X14 (GAUZE/BANDAGES/DRESSINGS) ×3 IMPLANT
ELECTRODE BLDE 4.0 EZ CLN MEGD (MISCELLANEOUS) IMPLANT
ELECTRODE REM PT RTRN 9FT ADLT (ELECTROSURGICAL) ×6 IMPLANT
FELT TEFLON 1X6 (MISCELLANEOUS) ×6 IMPLANT
GAUZE SPONGE 4X4 12PLY STRL (GAUZE/BANDAGES/DRESSINGS) ×6 IMPLANT
GLOVE SS BIOGEL STRL SZ 7.5 (GLOVE) ×3 IMPLANT
GOWN STRL REUS W/ TWL LRG LVL3 (GOWN DISPOSABLE) ×12 IMPLANT
GOWN STRL REUS W/ TWL XL LVL3 (GOWN DISPOSABLE) ×6 IMPLANT
HEMOSTAT POWDER SURGIFOAM 1G (HEMOSTASIS) ×9 IMPLANT
HEMOSTAT SURGICEL 2X14 (HEMOSTASIS) ×3 IMPLANT
INSERT FOGARTY XLG (MISCELLANEOUS) IMPLANT
KIT BASIN OR (CUSTOM PROCEDURE TRAY) ×3 IMPLANT
KIT SUCTION CATH 14FR (SUCTIONS) ×6 IMPLANT
KIT TURNOVER KIT B (KITS) ×3 IMPLANT
KIT VASOVIEW HEMOPRO 2 VH 4000 (KITS) ×3 IMPLANT
LEAD PACING MYOCARDI (MISCELLANEOUS) IMPLANT
LINE EXTENSION DELIVERY (MISCELLANEOUS) IMPLANT
MARKER GRAFT CORONARY BYPASS (MISCELLANEOUS) ×9 IMPLANT
NS IRRIG 1000ML POUR BTL (IV SOLUTION) ×15 IMPLANT
PACK E OPEN HEART (SUTURE) ×3 IMPLANT
PACK OPEN HEART (CUSTOM PROCEDURE TRAY) ×3 IMPLANT
PAD ARMBOARD POSITIONER FOAM (MISCELLANEOUS) ×6 IMPLANT
PAD ELECT DEFIB RADIOL ZOLL (MISCELLANEOUS) ×3 IMPLANT
PENCIL BUTTON HOLSTER BLD 10FT (ELECTRODE) ×3 IMPLANT
POSITIONER HEAD DONUT 9IN (MISCELLANEOUS) ×3 IMPLANT
PUNCH AORTIC ROTATE 4.0MM (MISCELLANEOUS) IMPLANT
PUNCH AORTIC ROTATE 4.5MM 8IN (MISCELLANEOUS) IMPLANT
SPONGE T-LAP 18X18 ~~LOC~~+RFID (SPONGE) IMPLANT
SPONGE T-LAP 4X18 ~~LOC~~+RFID (SPONGE) IMPLANT
SUPPORT HEART JANKE-BARRON (MISCELLANEOUS) ×3 IMPLANT
SUT BONE WAX W31G (SUTURE) ×3 IMPLANT
SUT ETHIBOND 2 0 SH 36X2 (SUTURE) IMPLANT
SUT MNCRL AB 4-0 PS2 18 (SUTURE) IMPLANT
SUT PROLENE 3 0 SH DA (SUTURE) ×3 IMPLANT
SUT PROLENE 4 0 SH DA (SUTURE) IMPLANT
SUT PROLENE 4-0 RB1 .5 CRCL 36 (SUTURE) IMPLANT
SUT PROLENE 5 0 C 1 36 (SUTURE) IMPLANT
SUT PROLENE 6 0 C 1 30 (SUTURE) ×6 IMPLANT
SUT PROLENE 7 0 BV1 MDA (SUTURE) ×3 IMPLANT
SUT STEEL 6MS V (SUTURE) ×3 IMPLANT
SUT STEEL SZ 6 DBL 3X14 BALL (SUTURE) ×3 IMPLANT
SUT VIC AB 1 CTX36XBRD ANBCTR (SUTURE) ×6 IMPLANT
SUT VIC AB 2-0 CT1 TAPERPNT 27 (SUTURE) IMPLANT
SYSTEM CLIP PENDITURE LAA 45 (Clip) IMPLANT
SYSTEM SAHARA CHEST DRAIN ATS (WOUND CARE) ×3 IMPLANT
TABLE PACK (MISCELLANEOUS) IMPLANT
TAPE CLOTH SURG 4X10 WHT LF (GAUZE/BANDAGES/DRESSINGS) IMPLANT
TAPE PAPER 2X10 WHT MICROPORE (GAUZE/BANDAGES/DRESSINGS) IMPLANT
TOWEL GREEN STERILE (TOWEL DISPOSABLE) ×3 IMPLANT
TOWEL GREEN STERILE FF (TOWEL DISPOSABLE) ×3 IMPLANT
TRAY FOLEY SLVR 16FR TEMP STAT (SET/KITS/TRAYS/PACK) ×3 IMPLANT
TUBE SUCT INTRACARD DLP 20F (MISCELLANEOUS) ×3 IMPLANT
TUBE SUCTION CARDIAC 10FR (CANNULA) ×3 IMPLANT
TUBING LAP HI FLOW INSUFFLATIO (TUBING) ×3 IMPLANT
UNDERPAD 30X36 HEAVY ABSORB (UNDERPADS AND DIAPERS) ×3 IMPLANT
WATER STERILE IRR 1000ML POUR (IV SOLUTION) ×6 IMPLANT

## 2023-07-30 NOTE — Assessment & Plan Note (Signed)
 A-fib: Eliquis  5 mg BID held for heparin  gtt, 44% burden Afib on monitor outpatient and on longterm AC Insomnia: Continue home trazodone  100 mg QHS and hydroxyzine  25 mg BID PRN HTN: As per NSTEMI above HLD: Not on ezetimibe and statin due to intolerance; LDL 154 and may need Repatha outpatient OSA: BiPAP per RT nightly CKD2: Recently diagnosed, Cr stable on admission Allergies: Fluticasone  daily, olopatadine  eye drops BID, cromolyn  drops QID

## 2023-07-30 NOTE — Anesthesia Procedure Notes (Signed)
 Procedure Name: Intubation Date/Time: 07/30/2023 12:43 PM  Performed by: Andee Bamberger, CRNAPre-anesthesia Checklist: Patient identified, Emergency Drugs available, Suction available and Patient being monitored Patient Re-evaluated:Patient Re-evaluated prior to induction Oxygen  Delivery Method: Circle system utilized Preoxygenation: Pre-oxygenation with 100% oxygen  Induction Type: IV induction Ventilation: Oral airway inserted - appropriate to patient size and Two handed mask ventilation required Laryngoscope Size: Glidescope and 4 Grade View: Grade I Tube type: Oral Tube size: 8.0 mm Number of attempts: 1 Airway Equipment and Method: Stylet and Oral airway Placement Confirmation: ETT inserted through vocal cords under direct vision, positive ETCO2 and breath sounds checked- equal and bilateral Secured at: 22 cm Tube secured with: Tape Dental Injury: Teeth and Oropharynx as per pre-operative assessment

## 2023-07-30 NOTE — Op Note (Signed)
 NAME: [name], [name]. MEDICAL RECORD NO: [id number] ACCOUNT NO: [id number] DATE OF BIRTH: Mar 27, 1950 FACILITY: [facility] LOCATION: [facility] PHYSICIAN: Milon Aloe. Luna Salinas, MD  Operative Report   DATE OF PROCEDURE: 07/30/2023  PREOPERATIVE DIAGNOSIS: Severe three-vessel coronary artery disease status post non-ST elevation myocardial infarction and history of paroxysmal atrial fibrillation.  POSTOPERATIVE DIAGNOSIS: Severe three-vessel coronary artery disease status post non-ST elevation myocardial infarction and history of paroxysmal atrial fibrillation.  PROCEDURE:  Median sternotomy, extracorporeal circulation,  Coronary artery bypass grafting x3  Left internal mammary artery to the LAD,  Saphenous vein graft to OM1,  Saphenous vein graft to posterior descending with coronary endarterectomy, Endoscopic vein harvest right leg,  Left atrial appendage clip with 45 mm Medtronic Penditure clip.  SURGEON: Milon Aloe. Luna Salinas, MD.  ASSISTANT: Parris Bolognese, PA.  Experienced assistance was necessary for this case due to surgical complexity. Parris Bolognese independently harvested the saphenous vein endoscopically while I harvested the mammary artery. He then closed the leg incisions. He also assisted with exposure, retraction of delicate tissues, suctioning, and suture management during the anastomosis.  ANESTHESIA: General.  FINDINGS: Transesophageal echocardiography revealed left ventricular hypertrophy, aortic valve and mitral annular calcification, but no stenosis or insufficiency. EF approximately 45%. No significant change post bypass. Diffusely diseased poor quality coronary targets.  Posterior descending poor quality required endarterectomy.  Posterolateral too small to graft.  Diagonal too diffusely diseased to graft.  OM1 fair to poor quality.  LAD diffusely diseased, fair quality at the site of the anastomosis.  Good quality conduits.  CLINICAL NOTE: [name] is a 73 year old  male with multiple cardiac risk factors who presented with acute onset of severe shortness of breath. He ruled in for a non-ST elevation myocardial infarction. At catheterization, he was found to have severe three-vessel coronary artery disease with diffusely diseased targets. The indications, risks, benefits, and alternatives were discussed in detail with the patient. He understood and accepted the risks. He understood this was higher risk because of the diffuse nature of his disease. He agreed to proceed.  OPERATIVE NOTE: [name] was brought to the operating room on 07/30/2023. He had induction of general anesthesia. Intravenous antibiotics were administered. A Foley catheter was placed. Transesophageal echocardiography was performed by Dr. Alvino Aye. Please see his separately dictated note for full details of the procedure. The chest, abdomen, and legs were prepped and draped in the usual sterile fashion.  A median sternotomy was performed and the left internal mammary artery was harvested using standard technique. This was difficult due to the patient's obese body habitus, but the mammary artery was a good quality vessel with excellent flow when divided distally. Simultaneously, an incision was made in the medial aspect of the right leg at the level of the knee. The greater saphenous vein was identified and was harvested endoscopically by Parris Bolognese. The saphenous vein was a good quality vessel as well. 2000 units of heparin  were administered during the vessel harvest. After the conduits had been harvested, the remainder of the full heparin  dose was given.  A sternal retractor was placed and was gradually opened over time. The pericardium was opened. The ascending aorta was inspected. There was no evidence of atherosclerotic disease. After confirming adequate anticoagulation with the ACT measurement, the aorta was cannulated via concentric, 2-0 Ethibond pledgeted pursestring sutures. A  dual-stage venous cannula was placed via a pursestring suture in the right atrial appendage. Cardiopulmonary bypass was initiated. Flows were maintained per protocol. The patient was cooled to 32 degrees  Celsius. The coronary arteries were inspected and anastomotic sites were chosen. The conduits were inspected and cut to length. A foam pad was placed in the pericardium to insulate the hardware. The temperature probe was placed in the myocardial septum and a cardioplegia cannula was placed in the ascending aorta.  The posterolateral was too small to graft and the diagonal was too diffusely diseased to graft. All of the vessels were diffusely diseased with relatively poor quality target vessels.  A reversed saphenous vein graft was placed end-to-side to the posterior descending branch of the right coronary. The artery was opened. There was significant plaque and an endarterectomy was performed. There was a good tethering plaque distally. The vein was beveled and was anastomosed end-to-side with a running 7-0 Prolene suture. At the completion of the anastomosis, cardioplegia was administered and there was good flow and good hemostasis.  Next, a reversed saphenous vein graft was placed end-to-side to OM1. This was the only significant vessel on the lateral wall. It was a 1.5-mm fair-to-poor quality target. The vein was anastomosed end-to-side with a running 7-0 Prolene suture. The probe passed easily distally. Cardioplegia was administered down the graft and there was good flow and good hemostasis.  Additional cardioplegia was administered down the aortic root. The left internal mammary artery was brought through a window in the pericardium. The distal end was beveled. It was then anastomosed end to side to the distal LAD. The LAD was grafted in an area relatively free of disease and a probe did pass to the apex. More proximally, there was significant plaque. The mammary was a 1.5 mm good quality conduit. It  was anastomosed end-to-side with a running 8-0 Prolene suture. At the completion of the anastomosis, the Bulldog clamp was briefly removed to inspect for hemostasis. Rapid septal rewarming was noted. The Bulldog clamp was replaced and the mammary pedicle was tacked to the epicardial surface of the heart with 6-0 Prolene sutures.  Additional cardioplegia was administered. The vein grafts were cut to length. The cardioplegia cannula was removed from the ascending aorta. The proximal vein graft anastomoses were performed to 4.5-mm punch aortotomies with running 6-0 Prolene sutures. At the completion of the final proximal anastomosis, the patient was placed in Trendelenburg position. Lidocaine  was administered. The aortic root was de-aired and the aortic cross clamp was removed. The total cross-clamp time was 71 minutes. The patient was initially in heart block.  It should be noted that after performing the vein graft distals but before performing the mammary artery to LAD graft, the left atrial appendage sized for a 45-mm Medtronic Penditure clip, which was placed at the base of the left atrium.  While rewarming was completed, all proximal and distal anastomoses were inspected for hemostasis. Epicardial pacing wires were placed on the right ventricle and right atrium. The patient was DDD paced at 80 beats per minute. When the patient had rewarmed to a core temperature of 37 degrees Celsius, he was weaned from cardiopulmonary bypass on the first attempt. Total bypass time was 113 minutes. The patient initially had reasonable hemodynamics, but just before the test dose of Protamine  was given, he lost capture on his pacemaker and was bradycardic in the 40's and hypotensive. As the venous cannula was being prepared to be replaced, he was given a bolus of epinephrine  and had a rapid increase in his blood pressure and heart rate. He then was observed and had no additional episodes. Test dose of protamine  was given and  was well  tolerated. The atrial cannulae had already been removed. The suture was tied. There was good hemostasis. The aortic cannula was removed. The remainder of the protamine  was administered without incident.   The chest was irrigated with warm saline. Hemostasis was achieved. The left pleural and mediastinal chest tubes were placed through separate subcostal incisions. The pericardium was reapproximated over the ascending aorta with interrupted 3-0 silk sutures. The sternum was closed with a combination of single and double heavy-gauge stainless steel wires. The pectoralis fascia and subcutaneous tissue and skin were closed in standard fashion. All sponge, needle, and instrument counts were correct at the end of the procedure.  The patient was transported from the operating room to the surgical intensive care unit, intubated and in fair condition.    SUJ D: 07/30/2023 6:51:34 pm T: 07/30/2023 11:43:00 pm  JOB: [id number]/ [id number]

## 2023-07-30 NOTE — Progress Notes (Signed)
 RT NOTE: Recruitment maneuver performed per MD with following settings: PC 25 above PEEP, RR 10, PEEP 5, 50% FiO2, Ti 3.0, and 1:E 1:1. PT tolerated well for 2 minutes and returned to previous settings. Vitals are stable. RT will continue to monitor.

## 2023-07-30 NOTE — Discharge Instructions (Signed)

## 2023-07-30 NOTE — Anesthesia Procedure Notes (Signed)
 Central Venous Catheter Insertion Performed by: Gorman Laughter, MD, anesthesiologist Start/End6/03/2023 11:50 AM, 07/30/2023 11:55 AM Patient location: Pre-op. Preanesthetic checklist: patient identified, IV checked, site marked, risks and benefits discussed, surgical consent, monitors and equipment checked, pre-op evaluation, timeout performed and anesthesia consent Position: Trendelenburg Hand hygiene performed  and maximum sterile barriers used  PA cath was placed.Swan type:thermodilution PA Cath depth:56 Procedure performed without using ultrasound guided technique. Attempts: 1 Post procedure assessment: free fluid flow and no air  Patient tolerated the procedure well with no immediate complications.

## 2023-07-30 NOTE — Assessment & Plan Note (Signed)
 Cardiac Cath show severe multi-level carotid stenosis. CTS following and considering CABG with left artrial appendage clip tentatively for Monday 07/30/23; recommend optimization of medical management including diuresis before procedure. Deemed poor candidate for PCI per cardiology. - Strict I&Os, daily weights, and heart healthy diet - Continuous cardiac monitoring and pulse oximetry - PT/OT eval and treat - AM BMP and Mag; goal K>4, Mag>2 - Cardiology consulted, appreciate recommendations:  - Heparin  gtt and transition to Eliquis  when appropriate  - Loaded with ASA 324 mg, continue ASA 81 mg daily  - Continue carvedilol  25 mg BID  - Likely resume Entresto, decision pending  - Hold Jardiance d/t CABG to prevent eDKA  - Lasix  40 mg IV BID - CT surgery consulted, appreciate recommendations

## 2023-07-30 NOTE — Progress Notes (Signed)
 PHARMACY - ANTICOAGULATION CONSULT NOTE  Pharmacy Consult for Heparin  Indication: atrial fibrillation, ACS/NSTEMI, CAD awaiting CABG  Allergies  Allergen Reactions   Ace Inhibitors Other (See Comments)    Angioedema per pt report to admiting MD On 07/31/2019 - lisinopril    Gabapentin Swelling and Other (See Comments)     Influenza-like illness   Peanut Oil Shortness Of Breath    Swelling throat, sob  Other reaction(s): anaphylaxis   Tamiflu [Oseltamivir Phosphate] Shortness Of Breath   Losartan Swelling    Other reaction(s): Lip swelling   Lyrica [Pregabalin] Swelling   Oxcarbazepine Swelling and Other (See Comments)    Lip swelling   Statins Other (See Comments)    Myalgia -- atorvastatin, simvastatin   Avelox [Moxifloxacin Hcl In Nacl] Rash   Ciprofloxacin  Anxiety   Citalopram Anxiety    Other reaction(s): Drowsy, Anxiety   Victoza [Liraglutide] Other (See Comments)    Pancreatitis per [facility]    Patient Measurements: Height: 6\' 2"  (188 cm) Weight: 119.4 kg (263 lb 3.7 oz) IBW/kg (Calculated) : 82.2 HEPARIN  DW (KG): 107.7  Vital Signs: Temp: 98 F (36.7 C) (06/02 0750) Temp Source: Oral (06/02 0500) BP: 139/61 (06/02 0750) Pulse Rate: 56 (06/02 0750)  Labs: Recent Labs    07/27/23 1201 07/27/23 2024 07/27/23 2024 07/28/23 0446 07/28/23 1611 07/29/23 0450 07/30/23 0604  HGB  --   --    < > 14.0  --  14.1 14.9  HCT  --   --   --  41.1  --  41.2 45.2  PLT  --   --   --  210  --  226 267  APTT  --  78*   < > 103* 85* 88*  --   LABPROT  --  12.7  --   --   --   --   --   INR  --  0.9  --   --   --   --   --   HEPARINUNFRC  --   --   --  0.81*  --  0.61 0.45  CREATININE 0.84  --   --  0.90  --  0.79  --    < > = values in this interval not displayed.    Estimated Creatinine Clearance: 114.6 mL/min (by C-G formula based on SCr of 0.79 mg/dL).  Assessment: 73 yo M admitted for CHF exacerbation, with new NSTEMI. He was on apixaban  PTA for afib with remote hx of  ICH. Cardiology recommends lifelong anticoagulation. Holding apixaban  in anticipation of cath 5/30, last dose 5/27 at 5am. Pharmacy consulted for heparin . Will monitor heparin  dosing with aPTTs while heparin  levels are falsely elevated due to recent apixaban  use.  Heparin  level and aPTT both therapeutic, likely correlating at this point. Will discontinue aPTT monitoring and utilize heparin  levels moving forward.  Heparin  level therapeutic at 0.45 while on 1650 units/hr. No bleeding noted, CBC stable. Plan is for CABG today.    Goal of Therapy:  Heparin  level 0.3-0.7 units/ml Monitor platelets by anticoagulation protocol: Yes   Plan:  Continue heparin  drip at 1650 units/hr Daily heparin  level and CBC Apixaban  on hold For CABG and LAA clip today  Thank you for involving pharmacy in this patient's care.  Caroline Cinnamon, PharmD, BCPS Clinical Pharmacist Clinical phone for 07/30/2023 is [phone] 07/30/2023 9:30 AM

## 2023-07-30 NOTE — Assessment & Plan Note (Signed)
 CIWAs discontinued with no signs of withdrawal since admission. - Thiamine , folate, and MVI daily - TOC consult placed

## 2023-07-30 NOTE — Transfer of Care (Signed)
 Immediate Anesthesia Transfer of Care Note  Patient: [name].  Procedure(s) Performed: CORONARY ARTERY BYPASS GRAFTING (CABG) TIMES THREE USING THE LEFT INTERNAL MAMMARY ARTERY AND ENDOSCOPICALLY HARVESTED RIGHT GREATER SAPHENOUS VEIN (Chest) ECHOCARDIOGRAM, TRANSESOPHAGEAL, INTRAOPERATIVE CLIPPING, LEFT ATRIAL APPENDAGE (Left)  Patient Location: ICU  Anesthesia Type:General  Level of Consciousness: sedated and Patient remains intubated per anesthesia plan  Airway & Oxygen  Therapy: Patient remains intubated per anesthesia plan and Patient placed on Ventilator (see vital sign flow sheet for setting)  Post-op Assessment: Report given to RN and Post -op Vital signs reviewed and stable  Post vital signs: Reviewed and stable  Last Vitals:  Vitals Value Taken Time  BP 125/55 07/30/23 1852  Temp 36.4 C 07/30/23 1907  Pulse 80 07/30/23 1907  Resp 16 07/30/23 1907  SpO2 95 % 07/30/23 1907  Vitals shown include unfiled device data.  Last Pain:  Vitals:   07/30/23 1126  TempSrc:   PainSc: 0-No pain      Patients Stated Pain Goal: 0 (07/30/23 1126)  Complications: No notable events documented.

## 2023-07-30 NOTE — Anesthesia Procedure Notes (Signed)
 Arterial Line Insertion Start/End6/03/2023 11:10 AM, 07/30/2023 11:11 AM Performed by: Gorman Laughter, MD, Andee Bamberger, CRNA, CRNA  Patient location: Pre-op. Preanesthetic checklist: patient identified, IV checked, site marked, risks and benefits discussed, surgical consent, monitors and equipment checked, pre-op evaluation, timeout performed and anesthesia consent Lidocaine  1% used for infiltration Left, radial was placed Catheter size: 20 G Hand hygiene performed , maximum sterile barriers used  and Seldinger technique used Allen's test indicative of satisfactory collateral circulation Attempts: 1 Procedure performed using ultrasound guided technique. Ultrasound Notes:anatomy identified, needle tip was noted to be adjacent to the nerve/plexus identified and no ultrasound evidence of intravascular and/or intraneural injection Following insertion, Biopatch. Post procedure assessment: normal  Patient tolerated the procedure well with no immediate complications.

## 2023-07-30 NOTE — Anesthesia Procedure Notes (Deleted)
 Central Venous Catheter Insertion Performed by: anesthesiologist Patient location: Pre-op. Preanesthetic checklist: patient identified, IV checked, site marked, risks and benefits discussed, surgical consent, monitors and equipment checked, pre-op evaluation, timeout performed and anesthesia consent Lidocaine  1% used for infiltration and patient sedated Hand hygiene performed  and maximum sterile barriers used  Catheter size: 8.5 Fr Sheath introducer PA Cath depth:55 Procedure performed using ultrasound guided technique. Ultrasound Notes:anatomy identified, needle tip was noted to be adjacent to the nerve/plexus identified, no ultrasound evidence of intravascular and/or intraneural injection and image(s) printed for medical record Attempts: 1 Following insertion, line sutured and dressing applied. Post procedure assessment: blood return through all ports, free fluid flow and no air  Patient tolerated the procedure well with no immediate complications.

## 2023-07-30 NOTE — Interval H&P Note (Signed)
 History and Physical Interval Note:  07/30/2023 7:38 AM  [name].  has presented today for surgery, with the diagnosis of CAD.  The various methods of treatment have been discussed with the patient and family. After consideration of risks, benefits and other options for treatment, the patient has consented to  Procedure(s): CORONARY ARTERY BYPASS GRAFTING (CABG) (N/A) ECHOCARDIOGRAM, TRANSESOPHAGEAL, INTRAOPERATIVE (N/A) CLIPPING, LEFT ATRIAL APPENDAGE (Left) as a surgical intervention.  The patient's history has been reviewed, patient examined, no change in status, stable for surgery.  I have reviewed the patient's chart and labs.  Questions were answered to the patient's satisfaction.     Zelphia Higashi

## 2023-07-30 NOTE — Assessment & Plan Note (Signed)
 Last A1c 6.2 this admission. Currently on 20U LAI with adequate control - CBGs ACHS (does have CGM, but per pharmacy prefer to still do CBGs) - Resistant SSI - Carb modified diet - Continue LAI 20 units daily

## 2023-07-30 NOTE — Brief Op Note (Signed)
 07/24/2023 - 07/30/2023  5:42 PM  PATIENT:  [name].  73 y.o. male  PRE-OPERATIVE DIAGNOSIS:  CORONARY ARTERY DISEASE  POST-OPERATIVE DIAGNOSIS:  CORONARY ARTERY DISEASE  PROCEDURE:  Procedure(s): CORONARY ARTERY BYPASS GRAFTING (CABG) TIMES THREE USING THE LEFT INTERNAL MAMMARY ARTERY AND ENDOSCOPICALLY HARVESTED RIGHT GREATER SAPHENOUS VEIN (N/A) ECHOCARDIOGRAM, TRANSESOPHAGEAL, INTRAOPERATIVE (N/A) CLIPPING, LEFT ATRIAL APPENDAGE (Left) Vein harvest time: Vein prep time:  SURGEON:  Zelphia Higashi, MD - Primary  PHYSICIAN ASSISTANT: Sheryle Vice  ASSISTANTS: Hiawatha Lout, RN, RN First Assistant    ANESTHESIA:   general  EBL:  BLOOD ADMINISTERED:Cell saver blood returned to patient  DRAINS: Mediastinal and left pleural drains   LOCAL MEDICATIONS USED:  NONE  COUNTS:  Correct  DICTATION: .Dragon Dictation  PLAN OF CARE: Admit to inpatient   PATIENT DISPOSITION:  ICU - extubated and stable.   Delay start of Pharmacological VTE agent (>24hrs) due to surgical blood loss or risk of bleeding: yes

## 2023-07-30 NOTE — Assessment & Plan Note (Signed)
 Stable - DuoNebs q6h PRN - Incruse Ellipta  (Spiriva Respimat formulary equivalent) daily -- will be switching back to home Spiriva after pharmacy review - Albuterol  neb prn - O2 sat goal 88-92%, BiPAP nightly for OSA - Recommended marijuana cessation or at least discontinuing smoking inhalation

## 2023-07-30 NOTE — Progress Notes (Signed)
 Daily Progress Note Intern Pager: [phone]  Patient name: [name]. Medical record number: [id number] Date of birth: [birth year] Age: 73 y.o. Gender: male  Primary Care Provider: Administration, Veterans Consultants: Cardiology, cardiothoracic surgery Code Status: Full code  Pt Overview and Major Events to Date:  5/27 - Admitted, started IV Lasix  for diuresis 5/28 - Cardiology consulted, TEE with LV RWMAb, heparin  GTT initiated 3/30 -  L/R HC, CT surgery consulted for CABG eval 6/02 - CABG  Assessment and Plan: [name] is a 73 year old male with past medical history of T2DM, A-fib on Eliquis , HTN, HLD, RBBB, and OSA on CPAP admitted for AHRF found to have NSTEMI.  Further cardiac evaluation revealed significant LAD stenosis and cardiology recommended consult to CT surgery for CABG, which should be performed today. Assessment & Plan NSTEMI (non-ST elevated myocardial infarction) [facility])  Cardiomyopathy Cardiac Cath show severe multi-level carotid stenosis. CTS following and considering CABG with left artrial appendage clip tentatively for Monday 07/30/23; recommend optimization of medical management including diuresis before procedure. Deemed poor candidate for PCI per cardiology. - Strict I&Os, daily weights, and heart healthy diet - Continuous cardiac monitoring and pulse oximetry - PT/OT eval and treat - AM BMP and Mag; goal K>4, Mag>2 - Cardiology consulted, appreciate recommendations:  - Heparin  gtt and transition to Eliquis  when appropriate  - Loaded with ASA 324 mg, continue ASA 81 mg daily  - Continue carvedilol  25 mg BID  - Likely resume Entresto, decision pending  - Hold Jardiance d/t CABG to prevent eDKA  - Lasix  40 mg IV BID - CT surgery consulted, appreciate recommendations Alcohol use  Hyponatremia CIWAs discontinued with no signs of withdrawal since admission. - Thiamine , folate, and MVI daily - TOC consult placed COPD (chronic obstructive  pulmonary disease) (HCC) Stable - DuoNebs q6h PRN - Incruse Ellipta  (Spiriva Respimat formulary equivalent) daily -- will be switching back to home Spiriva after pharmacy review - Albuterol  neb prn - O2 sat goal 88-92%, BiPAP nightly for OSA - Recommended marijuana cessation or at least discontinuing smoking inhalation T2DM (type 2 diabetes mellitus) (HCC) Last A1c 6.2 this admission. Currently on 20U LAI with adequate control - CBGs ACHS (does have CGM, but per pharmacy prefer to still do CBGs) - Resistant SSI - Carb modified diet - Continue LAI 20 units daily Chronic health problem A-fib: Eliquis  5 mg BID held for heparin  gtt, 44% burden Afib on monitor outpatient and on longterm AC Insomnia: Continue home trazodone  100 mg QHS and hydroxyzine  25 mg BID PRN HTN: As per NSTEMI above HLD: Not on ezetimibe and statin due to intolerance; LDL 154 and may need Repatha outpatient OSA: BiPAP per RT nightly CKD2: Recently diagnosed, Cr stable on admission Allergies: Fluticasone  daily, olopatadine  eye drops BID, cromolyn  drops QID  FEN/GI: NPO for procedure PPx: Heparin  gtt Dispo: Pending CABG and clinical improvement  Subjective:  Patient is doing well this morning.  He is anxious about his CABG procedure.  Xanax  and Valium  is ordered for preop.  Objective: Temp:  [97.1 F (36.2 C)-98.1 F (36.7 C)] 98 F (36.7 C) (06/02 0750) Pulse Rate:  [55-65] 56 (06/02 0750) Resp:  [18] 18 (06/02 0500) BP: (128-154)/(52-79) 139/61 (06/02 0750) SpO2:  [96 %-98 %] 97 % (06/02 0750) Weight:  [119.4 kg] 119.4 kg (06/02 0445) Physical Exam: General: Awake and Alert in NAD HEENT: NCAT. Sclera anicteric. No rhinorrhea. Cardiovascular: RRR. No M/R/G Respiratory: CTAB, normal WOB on RA. No wheezing, crackles, rhonchi, or  diminished breath sounds. Abdomen: Soft, non-tender, non-distended. Bowel sounds normoactive Extremities: Able to move all extremities. No BLE edema, no deformities or significant  joint findings. Skin: Warm and dry. No abrasions or rashes noted. Neuro: A&Ox3. No focal neurological deficits.  Laboratory: Most recent CBC Lab Results  Component Value Date   WBC 8.0 07/30/2023   HGB 14.9 07/30/2023   HCT 45.2 07/30/2023   MCV 88.6 07/30/2023   PLT 267 07/30/2023   Most recent BMP    Latest Ref Rng & Units 07/29/2023    4:50 AM  BMP  Glucose 70 - 99 mg/dL 161   BUN 8 - 23 mg/dL 14   Creatinine 0.96 - 1.24 mg/dL 0.45   Sodium 409 - 811 mmol/L 130   Potassium 3.5 - 5.1 mmol/L 3.8   Chloride 98 - 111 mmol/L 95   CO2 22 - 32 mmol/L 26   Calcium  8.9 - 10.3 mg/dL 8.7    Imaging/Diagnostic Tests: No new imaging.  Clyda Dark, DO 07/30/2023, 8:53 AM  PGY-1, [facility] Family Medicine FPTS Intern pager: [phone], text pages welcome Secure chat group [facility] Teaching Service

## 2023-07-30 NOTE — Anesthesia Procedure Notes (Addendum)
 Central Venous Catheter Insertion Performed by: Gorman Laughter, MD, anesthesiologist Start/End6/03/2023 11:35 AM, 07/30/2023 11:50 AM Patient location: Pre-op. Preanesthetic checklist: patient identified, IV checked, site marked, risks and benefits discussed, surgical consent, monitors and equipment checked, pre-op evaluation, timeout performed and anesthesia consent Position: Trendelenburg Lidocaine  1% used for infiltration and patient sedated Hand hygiene performed , maximum sterile barriers used  and Seldinger technique used Catheter size: 8.5 Fr Sheath introducer Procedure performed using ultrasound guided technique. Ultrasound Notes:anatomy identified, needle tip was noted to be adjacent to the nerve/plexus identified, no ultrasound evidence of intravascular and/or intraneural injection and image(s) printed for medical record Attempts: 1 Following insertion, line sutured, dressing applied and Biopatch. Post procedure assessment: blood return through all ports, free fluid flow and no air  Patient tolerated the procedure well with no immediate complications.

## 2023-07-31 ENCOUNTER — Encounter (HOSPITAL_COMMUNITY): Payer: Self-pay | Admitting: Thoracic Surgery (Cardiothoracic Vascular Surgery)

## 2023-07-31 ENCOUNTER — Inpatient Hospital Stay (HOSPITAL_COMMUNITY)

## 2023-07-31 DIAGNOSIS — Z951 Presence of aortocoronary bypass graft: Secondary | ICD-10-CM

## 2023-07-31 LAB — GLUCOSE, CAPILLARY
Glucose-Capillary: 124 mg/dL — ABNORMAL HIGH (ref 70–99)
Glucose-Capillary: 129 mg/dL — ABNORMAL HIGH (ref 70–99)
Glucose-Capillary: 131 mg/dL — ABNORMAL HIGH (ref 70–99)
Glucose-Capillary: 137 mg/dL — ABNORMAL HIGH (ref 70–99)
Glucose-Capillary: 139 mg/dL — ABNORMAL HIGH (ref 70–99)
Glucose-Capillary: 141 mg/dL — ABNORMAL HIGH (ref 70–99)
Glucose-Capillary: 158 mg/dL — ABNORMAL HIGH (ref 70–99)
Glucose-Capillary: 161 mg/dL — ABNORMAL HIGH (ref 70–99)
Glucose-Capillary: 162 mg/dL — ABNORMAL HIGH (ref 70–99)
Glucose-Capillary: 185 mg/dL — ABNORMAL HIGH (ref 70–99)
Glucose-Capillary: 194 mg/dL — ABNORMAL HIGH (ref 70–99)
Glucose-Capillary: 227 mg/dL — ABNORMAL HIGH (ref 70–99)

## 2023-07-31 LAB — BASIC METABOLIC PANEL WITH GFR
Anion gap: 7 (ref 5–15)
Anion gap: 13 (ref 5–15)
BUN: 15 mg/dL (ref 8–23)
BUN: 13 mg/dL (ref 8–23)
CO2: 24 mmol/L (ref 22–32)
CO2: 22 mmol/L (ref 22–32)
Calcium: 8.1 mg/dL — ABNORMAL LOW (ref 8.9–10.3)
Calcium: 7.8 mg/dL — ABNORMAL LOW (ref 8.9–10.3)
Chloride: 100 mmol/L (ref 98–111)
Chloride: 92 mmol/L — ABNORMAL LOW (ref 98–111)
Creatinine, Ser: 1 mg/dL (ref 0.61–1.24)
Creatinine, Ser: 0.95 mg/dL (ref 0.61–1.24)
GFR, Estimated: >60 mL/min (ref >60)
GFR, Estimated: >60 mL/min (ref >60)
Glucose, Bld: 138 mg/dL — ABNORMAL HIGH (ref 70–99)
Glucose, Bld: 202 mg/dL — ABNORMAL HIGH (ref 70–99)
Potassium: 4.4 mmol/L (ref 3.5–5.1)
Potassium: 4 mmol/L (ref 3.5–5.1)
Sodium: 131 mmol/L — ABNORMAL LOW (ref 135–145)
Sodium: 127 mmol/L — ABNORMAL LOW (ref 135–145)

## 2023-07-31 LAB — HEPARIN LEVEL (UNFRACTIONATED): Heparin Unfractionated: <0.1 [IU]/mL — ABNORMAL LOW (ref 0.30–0.70)

## 2023-07-31 LAB — POCT I-STAT 7, (LYTES, BLD GAS, ICA,H+H)
Acid-base deficit: 1 mmol/L (ref 0.0–2.0)
Acid-base deficit: 1 mmol/L (ref 0.0–2.0)
Bicarbonate: 22.3 mmol/L (ref 20.0–28.0)
Bicarbonate: 24.8 mmol/L (ref 20.0–28.0)
Calcium, Ion: 1.19 mmol/L (ref 1.15–1.40)
Calcium, Ion: 1.22 mmol/L (ref 1.15–1.40)
HCT: 31 % — ABNORMAL LOW (ref 39.0–52.0)
HCT: 32 % — ABNORMAL LOW (ref 39.0–52.0)
Hemoglobin: 10.5 g/dL — ABNORMAL LOW (ref 13.0–17.0)
Hemoglobin: 10.9 g/dL — ABNORMAL LOW (ref 13.0–17.0)
O2 Saturation: 97 %
O2 Saturation: 98 %
Patient temperature: 36.8
Patient temperature: 36.8
Potassium: 4.3 mmol/L (ref 3.5–5.1)
Potassium: 4.3 mmol/L (ref 3.5–5.1)
Sodium: 133 mmol/L — ABNORMAL LOW (ref 135–145)
Sodium: 134 mmol/L — ABNORMAL LOW (ref 135–145)
TCO2: 23 mmol/L (ref 22–32)
TCO2: 26 mmol/L (ref 22–32)
pCO2 arterial: 32 mmHg (ref 32–48)
pCO2 arterial: 46.9 mmHg (ref 32–48)
pH, Arterial: 7.33 — ABNORMAL LOW (ref 7.35–7.45)
pH, Arterial: 7.451 — ABNORMAL HIGH (ref 7.35–7.45)
pO2, Arterial: 100 mmHg (ref 83–108)
pO2, Arterial: 92 mmHg (ref 83–108)

## 2023-07-31 LAB — CBC
HCT: 31.6 % — ABNORMAL LOW (ref 39.0–52.0)
HCT: 30.7 % — ABNORMAL LOW (ref 39.0–52.0)
Hemoglobin: 10.7 g/dL — ABNORMAL LOW (ref 13.0–17.0)
Hemoglobin: 10.2 g/dL — ABNORMAL LOW (ref 13.0–17.0)
MCH: 30.1 pg (ref 26.0–34.0)
MCH: 30.1 pg (ref 26.0–34.0)
MCHC: 33.9 g/dL (ref 30.0–36.0)
MCHC: 33.2 g/dL (ref 30.0–36.0)
MCV: 88.8 fL (ref 80.0–100.0)
MCV: 90.6 fL (ref 80.0–100.0)
Platelets: 230 10*3/uL (ref 150–400)
Platelets: 172 10*3/uL (ref 150–400)
RBC: 3.56 MIL/uL — ABNORMAL LOW (ref 4.22–5.81)
RBC: 3.39 MIL/uL — ABNORMAL LOW (ref 4.22–5.81)
RDW: 13.5 % (ref 11.5–15.5)
RDW: 13.6 % (ref 11.5–15.5)
WBC: 17.6 10*3/uL — ABNORMAL HIGH (ref 4.0–10.5)
WBC: 12.3 10*3/uL — ABNORMAL HIGH (ref 4.0–10.5)
nRBC: 0 % (ref 0.0–0.2)
nRBC: 0 % (ref 0.0–0.2)

## 2023-07-31 LAB — MAGNESIUM
Magnesium: 2.6 mg/dL — ABNORMAL HIGH (ref 1.7–2.4)
Magnesium: 2 mg/dL (ref 1.7–2.4)

## 2023-07-31 MED ORDER — INSULIN ASPART 100 UNIT/ML IJ SOLN
0.0000 [IU] | INTRAMUSCULAR | Status: DC
  Administered 2023-07-31 (×2): 2 [IU] via SUBCUTANEOUS
  Administered 2023-07-31: 8 [IU] via SUBCUTANEOUS
  Administered 2023-08-01 (×3): 2 [IU] via SUBCUTANEOUS

## 2023-07-31 MED ORDER — ENOXAPARIN SODIUM 40 MG/0.4ML IJ SOSY
40.0000 mg | PREFILLED_SYRINGE | Freq: Every day | INTRAMUSCULAR | Status: DC
Start: 2023-07-31 — End: 2023-08-01
  Administered 2023-07-31: 40 mg via SUBCUTANEOUS
  Filled 2023-07-31: qty 0.4

## 2023-07-31 MED ORDER — INSULIN GLARGINE-YFGN 100 UNIT/ML ~~LOC~~ SOLN
40.0000 [IU] | Freq: Two times a day (BID) | SUBCUTANEOUS | Status: DC
  Administered 2023-07-31 (×2): 40 [IU] via SUBCUTANEOUS
  Filled 2023-07-31 (×4): qty 0.4

## 2023-07-31 MED ORDER — ALBUMIN HUMAN 5 % IV SOLN
12.5000 g | Freq: Once | INTRAVENOUS | Status: AC
  Administered 2023-07-31: 12.5 g via INTRAVENOUS
  Filled 2023-07-31: qty 250

## 2023-07-31 MED ORDER — ALPRAZOLAM 0.25 MG PO TABS
0.2500 mg | ORAL_TABLET | Freq: Four times a day (QID) | ORAL | Status: DC | PRN
  Administered 2023-07-31 – 2023-08-01 (×3): 0.5 mg via ORAL
  Administered 2023-08-01: 0.25 mg via ORAL
  Administered 2023-08-03 – 2023-08-07 (×10): 0.5 mg via ORAL
  Administered 2023-08-08: 0.25 mg via ORAL
  Administered 2023-08-08 (×2): 0.5 mg via ORAL
  Administered 2023-08-09: 0.25 mg via ORAL
  Administered 2023-08-09: 0.5 mg via ORAL
  Filled 2023-07-31 (×9): qty 2
  Filled 2023-07-31: qty 1
  Filled 2023-07-31 (×3): qty 2
  Filled 2023-07-31: qty 1
  Filled 2023-07-31 (×6): qty 2

## 2023-07-31 MED ORDER — FUROSEMIDE 10 MG/ML IJ SOLN
40.0000 mg | Freq: Two times a day (BID) | INTRAMUSCULAR | Status: DC
  Administered 2023-07-31 (×2): 40 mg via INTRAVENOUS
  Filled 2023-07-31 (×3): qty 4

## 2023-07-31 NOTE — TOC Initial Note (Signed)
 Transition of Care (TOC) - Initial/Assessment Note    Patient Details  Name: [name]. MRN: [id number] Date of Birth: Jun 22, 1950  Transition [facility]) CM/SW Contact:    Cosimo Diones, RN Phone Number: 07/31/2023, 2:43 PM  Clinical Narrative: Patient is a transfer from 2-W. Patient POD-1 CABG. PTA patient was from home with spouse that has Parkinson's Disease. Patient is the caregiver to the spouse and he has additional support of daughter. Case Manager will continue to follow for transition of care needs as the patient progresses.                  Expected Discharge Plan: Home w Home Health Services Barriers to Discharge: Continued Medical Work up  Expected Discharge Plan and Services In-house Referral: NA Discharge Planning Services: CM Consult Post Acute Care Choice: Home Health Living arrangements for the past 2 months: Single Family Home  Prior Living Arrangements/Services Living arrangements for the past 2 months: Single Family Home Lives with::  (Support of daughter.) Patient language and need for interpreter reviewed:: Yes Do you feel safe going back to the place where you live?: Yes      Need for Family Participation in Patient Care: Yes (Comment)     Criminal Activity/Legal Involvement Pertinent to Current Situation/Hospitalization: No - Comment as needed  Activities of Daily Living   ADL Screening (condition at time of admission) Independently performs ADLs?: Yes (appropriate for developmental age) Is the patient deaf or have difficulty hearing?: No Does the patient have difficulty seeing, even when wearing glasses/contacts?: No Does the patient have difficulty concentrating, remembering, or making decisions?: No  Permission Sought/Granted Permission sought to share information with : Family Supports, Case Manager    Alcohol / Substance Use: Not Applicable Psych Involvement: No (comment)  Admission diagnosis:  Acute pulmonary edema  (HCC) [J81.0] Respiratory distress [R06.03] Acute hypoxemic respiratory failure (HCC) [J96.01] S/P CABG x 3 [Z95.1] Patient Active Problem List   Diagnosis Date Noted   Ischemic cardiomyopathy 07/30/2023   Hypertension associated with diabetes (HCC) 07/30/2023   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   Cataract of left eye 07/28/2023   Benign prostatic hyperplasia with lower urinary tract symptoms 07/28/2023   Gastric contents in larynx causing other injury, initial encounter 07/28/2023   Generalized anxiety disorder 07/28/2023   Personal history of colon polyps, unspecified 07/28/2023   Morbid obesity (HCC) 07/28/2023   Non-toxic multinodular goiter 07/28/2023   Phlebitis and thrombophlebitis of lower extremities 07/28/2023   Sensorineural hearing loss, bilateral 07/28/2023   Statin not tolerated 07/28/2023   Coronary artery disease involving native coronary artery of native heart without angina pectoris 07/27/2023   NSTEMI (non-ST elevated myocardial infarction) (HCC)  Cardiomyopathy 07/24/2023   T2DM (type 2 diabetes mellitus) (HCC) 07/24/2023   COPD (chronic obstructive pulmonary disease) (HCC) 07/24/2023   Alcohol use  Hyponatremia 07/24/2023   Chronic health problem 07/24/2023   Paroxysmal atrial fibrillation (HCC) 09/22/2021   Mixed hyperlipidemia 09/20/2021   Diverticulitis 09/07/2019   Constipated 09/07/2019   Dehydration 09/07/2019   Constipation 09/07/2019   Colon stricture - diverticular (HCC) 09/07/2019   Chest pain 07/31/2019   Sleep apnea 07/31/2019   Essential hypertension, benign    Dyspepsia    Type 2 diabetes mellitus with hyperglycemia, with long-term current use of insulin  [facility])    PCP:  Administration, Veterans Pharmacy:   [facility] - [place], [place] - [address] [place] [address] Phone: [phone]  Fax: [phone]     Social Drivers of Health (SDOH) Social History: SDOH  Screenings   Food Insecurity: No Food Insecurity (07/24/2023)  Housing: Low Risk  (07/24/2023)  Transportation Needs: No Transportation Needs (07/24/2023)  Utilities: Not At Risk (07/24/2023)  Social Connections: Moderately Integrated (07/24/2023)  Tobacco Use: Medium Risk (07/30/2023)   SDOH Interventions:     Readmission Risk Interventions     No data to display

## 2023-07-31 NOTE — Procedures (Signed)
 Extubation Procedure Note  Patient Details:   Name: Antwone Capozzoli. DOB: [birth year] MRN: [id number]   Airway Documentation:    Vent end date: 07/31/23 Vent end time: 0030   Evaluation  O2 sats: stable throughout Complications: No apparent complications Patient did tolerate procedure well. Bilateral Breath Sounds: Clear, Diminished   Yes  Patient extubated per order and protocol with no apparent complications. Positive cuff leak was noted prior to extubation. Patient achieved NIF of -30 and VC of 1.1L with good patient effort. Patient is alert and oriented, is able to speak and has strong cough. Vitals are stable. RT will continue to monitor.   Kimmy Parish Seldon Dago 07/31/2023, 12:36 AM

## 2023-07-31 NOTE — Plan of Care (Signed)

## 2023-07-31 NOTE — Inpatient Diabetes Management (Signed)
 Inpatient Diabetes Program Recommendations  AACE/ADA: New Consensus Statement on Inpatient Glycemic Control (2015)  Target Ranges:  Prepandial:   less than 140 mg/dL      Peak postprandial:   less than 180 mg/dL (1-2 hours)      Critically ill patients:  140 - 180 mg/dL   Lab Results  Component Value Date   GLUCAP 137 (H) 07/31/2023   HGBA1C 6.2 (H) 07/24/2023    Review of Glycemic Control  Latest Reference Range & Units 07/31/23 04:56 07/31/23 06:01 07/31/23 07:19 07/31/23 08:22 07/31/23 09:48  Glucose-Capillary 70 - 99 mg/dL 161 (H) 096 (H) 045 (H) 124 (H) 137 (H)   Diabetes history: DM 2 Outpatient Diabetes medications:  Lantus  20 units bid, Novolog  0-8 units tid with meals Current orders for Inpatient glycemic control:  Semglee  40 units bid Novolog  TCTS q 4 hours  Inpatient Diabetes Program Recommendations:    Note patient to transition off insulin  drip today to Semglee  40 units bid.  May need reduction in Semglee  to 20 units bid (based on home dose).    Thanks,  Josefa Ni, RN, BC-ADM Inpatient Diabetes Coordinator Pager [phone]  (8a-5p)

## 2023-07-31 NOTE — Progress Notes (Signed)
 1 Day Post-Op Procedure(s) (LRB): CORONARY ARTERY BYPASS GRAFTING (CABG) TIMES THREE USING THE LEFT INTERNAL MAMMARY ARTERY AND ENDOSCOPICALLY HARVESTED RIGHT GREATER SAPHENOUS VEIN (N/A) ECHOCARDIOGRAM, TRANSESOPHAGEAL, INTRAOPERATIVE (N/A) CLIPPING, LEFT ATRIAL APPENDAGE (Left) Subjective: C/o incisional pain  Objective: Vital signs in last 24 hours: Temp:  [97.3 F (36.3 C)-98.6 F (37 C)] 97.5 F (36.4 C) (06/03 0700) Pulse Rate:  [56-80] 71 (06/03 0700) Cardiac Rhythm: Normal sinus rhythm (06/03 0450) Resp:  [10-27] 18 (06/03 0700) BP: (87-151)/(50-96) 111/56 (06/03 0700) SpO2:  [87 %-100 %] 98 % (06/03 0700) Arterial Line BP: (68-196)/(31-79) 118/47 (06/03 0700) FiO2 (%):  [36 %-50 %] 36 % (06/03 0032) Weight:  [119.4 kg-129.5 kg] 129.5 kg (06/03 0500)  Hemodynamic parameters for last 24 hours: PAP: (19-46)/(9-25) 32/17 CO:  [3.8 L/min-5 L/min] 4.8 L/min CI:  [1.58 L/min/m2-2.05 L/min/m2] 1.96 L/min/m2  Intake/Output from previous day: 06/02 0701 - 06/03 0700 In: 5670.3 [I.V.:3562.1; Blood:420; IV Piggyback:1688.2] Out: 2840 [Urine:1675; Blood:915; Chest Tube:250] Intake/Output this shift: No intake/output data recorded.  General appearance: alert, cooperative, and no distress Neurologic: intact Heart: regular rate and rhythm Lungs: diminished breath sounds bibasilar Abdomen: obese, soft, + BS  Lab Results: Recent Labs    07/30/23 1905 07/31/23 0024 07/31/23 0134 07/31/23 0459  WBC 7.1  --   --  17.6*  HGB 11.5*   < > 10.9* 10.7*  HCT 34.1*   < > 32.0* 31.6*  PLT 174  --   --  230   < > = values in this interval not displayed.   BMET:  Recent Labs    07/29/23 0450 07/30/23 1250 07/30/23 1757 07/30/23 1903 07/31/23 0134 07/31/23 0459  NA 130*   < > 134*   < > 134* 131*  K 3.8   < > 3.8   < > 4.3 4.4  CL 95*   < > 98  --   --  100  CO2 26  --   --   --   --  24  GLUCOSE 128*   < > 153*  --   --  138*  BUN 14   < > 15  --   --  15  CREATININE  0.79   < > 0.80  --   --  1.00  CALCIUM  8.7*  --   --   --   --  8.1*   < > = values in this interval not displayed.    PT/INR:  Recent Labs    07/30/23 1905  LABPROT 17.0*  INR 1.4*   ABG    Component Value Date/Time   PHART 7.330 (L) 07/31/2023 0134   HCO3 24.8 07/31/2023 0134   TCO2 26 07/31/2023 0134   ACIDBASEDEF 1.0 07/31/2023 0134   O2SAT 97 07/31/2023 0134   CBG (last 3)  Recent Labs    07/31/23 0456 07/31/23 0601 07/31/23 0719  GLUCAP 158* 162* 131*    Assessment/Plan: S/P Procedure(s) (LRB): CORONARY ARTERY BYPASS GRAFTING (CABG) TIMES THREE USING THE LEFT INTERNAL MAMMARY ARTERY AND ENDOSCOPICALLY HARVESTED RIGHT GREATER SAPHENOUS VEIN (N/A) ECHOCARDIOGRAM, TRANSESOPHAGEAL, INTRAOPERATIVE (N/A) CLIPPING, LEFT ATRIAL APPENDAGE (Left) POD # 1 NEURO- intact CV- in SR with good hemodynamics  Decrease milrinone to 0.125  Wean epi and neo  Dc Swan, A line  ASA, beta blocker  Statin allergy  Resume Eliquis  in a day or two RESP_ IS for atelectasis  May have some loculated left pleural effusion- had adhesions there RENAL- creatinine and lytes Ok ENDO- CBG elevated, requiring  relatively high doses of insulin   Transition to Semglee  + SSI GI - diet as tolerated Dc chest tubes SCD + enoxaparin  mobilize   LOS: 7 days    [name] 07/31/2023

## 2023-07-31 NOTE — Discharge Summary (Signed)
 [address]       [name] [address]             [phone]    Physician Discharge Summary  Patient ID: [name]. MRN: [id number] DOB/AGE: [birth year] 73 y.o.  Admit date: 07/24/2023 Discharge date: 08/10/2023  Admission Diagnoses:  Patient Active Problem List   Diagnosis Date Noted   Ischemic cardiomyopathy 07/30/2023   Hypertension associated with diabetes (HCC) 07/30/2023   Cataract of left eye 07/28/2023   Benign prostatic hyperplasia with lower urinary tract symptoms 07/28/2023   Gastric contents in larynx causing other injury, initial encounter 07/28/2023   Generalized anxiety disorder 07/28/2023   Personal history of colon polyps, unspecified 07/28/2023   Morbid obesity (HCC) 07/28/2023   Non-toxic multinodular goiter 07/28/2023   Phlebitis and thrombophlebitis of lower extremities 07/28/2023   Sensorineural hearing loss, bilateral 07/28/2023   Statin not tolerated 07/28/2023   Coronary artery disease involving native coronary artery of native heart without angina pectoris 07/27/2023   NSTEMI (non-ST elevated myocardial infarction) (HCC)  Cardiomyopathy 07/24/2023   T2DM (type 2 diabetes mellitus) (HCC) 07/24/2023   COPD (chronic obstructive pulmonary disease) (HCC) 07/24/2023   Alcohol use  Hyponatremia 07/24/2023   Chronic health problem 07/24/2023   Paroxysmal atrial fibrillation (HCC) 09/22/2021   Mixed hyperlipidemia 09/20/2021   Diverticulitis 09/07/2019   Constipated 09/07/2019   Dehydration 09/07/2019   Constipation 09/07/2019   Colon stricture - diverticular (HCC) 09/07/2019   Chest pain 07/31/2019   Sleep apnea 07/31/2019   Essential hypertension, benign    Dyspepsia    Type 2 diabetes mellitus with hyperglycemia, with long-term current use of insulin  [facility])     Discharge Diagnoses:  Patient Active Problem List   Diagnosis Date Noted   Persistent atrial fibrillation (HCC) 08/03/2023   Ischemic cardiomyopathy  07/30/2023   Hypertension associated with diabetes (HCC) 07/30/2023   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   Cataract of left eye 07/28/2023   Benign prostatic hyperplasia with lower urinary tract symptoms 07/28/2023   Gastric contents in larynx causing other injury, initial encounter 07/28/2023   Generalized anxiety disorder 07/28/2023   Personal history of colon polyps, unspecified 07/28/2023   Morbid obesity (HCC) 07/28/2023   Non-toxic multinodular goiter 07/28/2023   Phlebitis and thrombophlebitis of lower extremities 07/28/2023   Sensorineural hearing loss, bilateral 07/28/2023   Statin not tolerated 07/28/2023   Coronary artery disease involving native coronary artery of native heart without angina pectoris 07/27/2023   NSTEMI (non-ST elevated myocardial infarction) (HCC)  Cardiomyopathy 07/24/2023   T2DM (type 2 diabetes mellitus) (HCC) 07/24/2023   COPD (chronic obstructive pulmonary disease) (HCC) 07/24/2023   Alcohol use  Hyponatremia 07/24/2023   Chronic health problem 07/24/2023   Paroxysmal atrial fibrillation (HCC) 09/22/2021   Mixed hyperlipidemia 09/20/2021   Diverticulitis 09/07/2019   Constipated 09/07/2019   Dehydration 09/07/2019   Constipation 09/07/2019   Colon stricture - diverticular (HCC) 09/07/2019   Chest pain 07/31/2019   Sleep apnea 07/31/2019   Essential hypertension, benign    Dyspepsia    Type 2 diabetes mellitus with hyperglycemia, with long-term current use of insulin  (HCC)     Discharged Condition: stable  History of Present Illness:     [name] is a pleasant 73 year old gentleman with a past history of dyslipidemia intolerant of statins, obesity alcohol and cannabis abuse, hypertension, paroxysmal atrial fibrillation on Eliquis , remote history of DVT and former tobacco smoker having quit in 1979.  He was in [employer] on 07/24/2023 when he developed acute onset severe shortness of breath.  EMS was summoned.  He was noted to have a oxygen   saturation of 57%.  He was placed on BiPAP and brought to the emergency room.  In the ED, chest x-ray showed asymmetric pulmonary edema versus pneumonia.  Initial high-sensitivity troponin was over 7000.  EKG showed sinus tachycardia with first-degree AV block, right bundle blanch block, and anterior T wave inversions. He was ruled in for NSTEMI.  [name] was admitted to the hospital and diuresed aggressively with good response.  Serial high-sensitivity troponins remained elevated.  He was taken to the Cath Lab earlier today where coronary angiography demonstrated severe three-vessel coronary artery disease.  CT surgery has been asked to evaluate for consideration of coronary bypass grafting. [name] is retired from his previous occupation as a Chartered certified accountant.  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 He endorses frequent chest tightness and shortness of breath with activities like walking up a hill or upstairs.  Hospital Course: The patient was admitted to [facility] and cardiothoracic surgery was consulted. Dr. Luna Salinas reviewed the diagnostic studies and determined he would benefit from surgical intervention. He reviewed the treatment options as well as the risks and benefits of surgery, [name] was agreeable to proceed with surgery. He remained stable while in the hospital and was brought to the operating room on 07/30/23. He underwent CABG x 3 utilizing LIMA to LAD, SVG to PDA with coronary endarterectomy, and SVG to OM1, as well as endoscopic harvest of the right greater saphenous vein. He tolerated the procedure well and was transferred to the SICU in stable condition. He was extubated early morning POD1 without complication. He required Milrinone , epinephrine , and neosynephrine for hemodynamic support. This was weaned as hemodynamics tolerated. Swan ganz catheter, arterial line, and chest tubes were removed without complication on POD1. The patient  had trouble balancing due to neuropathy.  Physical therapy consult was obtained and recommended CIR.  He will be transitioned to home regimen of Coreg .  Home Eliquis  was resumed for PAF.  He was in NSR and his pacing wires were removed without difficulty.  The patient did not respond much to oral Lasix . This was changed to IV dosing with IV Lasix  40mg  BID. His chest xray showed increased atelectasis and left pleural effusion.  He was instructed on good use of IS, flutter valve was added as well as mucinex . TED hose were also placed. Oxygen  was weaned as tolerated. He developed atrial fibrillation with controlled rates. He had minimal response to the IV Lasix  so Zaroxolyn  5mg  daily was added with some improvement. Oxygen  was weaned and he began saturating well on room air. He was diuresing well, IV Lasix  was transitioned to PO Lasix . He was started on low dose Entresto  but developed hypotension so this was discontinued. He was found to have a UTI and was given Fosfomycin as discussed with pharmacy, culture was pending. He had acute on chronic hyponatremia, diuresis was held and he continued to diurese well independently. He was ambulating well on room air and incisions were healing well without sign of infection. He was felt stable for discharge to SNF on 06/12 but his daughters had not yet chosen a facility. SNF was arranged and he was discharged on 06/13.   Consults: None  Significant Diagnostic Studies:  RIGHT/LEFT HEART CATH AND CORONARY ANGIOGRAPHY     Prox RCA lesion is 40% stenosed.  Dist RCA lesion is 75% stenosed.   RPDA lesion is 80% stenosed.   1st RPL lesion is 80% stenosed.   1st Mrg lesion is 90% stenosed.   1st Diag lesion is 95% stenosed.   Prox LAD to Mid LAD lesion is 90% stenosed.   Mid LAD to Dist LAD lesion is 90% stenosed.   Dist LAD lesion is 90% stenosed.   LV end diastolic pressure is normal.   1.  Patent left main with no significant stenosis 2.  Severe diffuse LAD  stenosis with predominant disease in the mid and distal vessel 3.  Severe first diagonal stenosis 4.  Severe diffuse first OM stenosis 5.  Severe RCA and PDA/PLA stenoses 6.  Normal LVEDP 7.  Right heart catheterization data: Preserved cardiac output and index of 5.6 and 2.6, respectively RA mean 7 mmHg RV 39/10 mmHg PA 36/13 mean 24 mmHg Pulmonary capillary wedge pressure mean 15 mmHg  ECHOCARDIOGRAM REPORT    Patient Name:   [name]. Date of Exam: 07/25/2023  Medical Rec #:  [id number]           Height:       74.0 in  Accession #:    [id number]          Weight:       265.0 lb  Date of Birth:  1950-07-06            BSA:          2.450 m  Patient Age:    72 years            BP:           128/58 mmHg  Patient Gender: M                   HR:           66 bpm.  Exam Location:  Inpatient   Procedure: 2D Echo, Cardiac Doppler and Color Doppler (Both Spectral and  Color            Flow Doppler were utilized during procedure).   Indications:     Acutre respiratory distress    History:         Patient has prior history of Echocardiogram examinations,  most                  recent 09/08/2019. Risk Factors:Hypertension.    Sonographer:     Janette Medley  Referring Phys:  Homer Lust  Diagnosing Phys: Maudine Sos MD     Sonographer Comments: Patient is obese. Image acquisition challenging due  to patient body habitus.  IMPRESSIONS    1. Left ventricular ejection fraction, by estimation, is 55 to 60%. The  left ventricle has normal function. The left ventricle demonstrates  regional wall motion abnormalities (see scoring diagram/findings for  description). There is moderate left  ventricular hypertrophy. Left ventricular diastolic parameters are  consistent with Grade II diastolic dysfunction (pseudonormalization).  Elevated left ventricular end-diastolic pressure.   2. Right ventricular systolic function is normal. The right ventricular  size is normal. There is  normal pulmonary artery systolic pressure.   3. Left atrial size was mildly dilated.   4. The mitral valve is normal in structure. No evidence of mitral valve  regurgitation. No evidence of mitral stenosis. Severe mitral annular  calcification.   5. The aortic valve is tricuspid. There is moderate calcification of the  aortic valve. There is moderate thickening of  the aortic valve. Aortic  valve regurgitation is not visualized. Aortic valve  sclerosis/calcification is present, without any evidence  of aortic stenosis. Aortic valve area, by VTI measures 2.42 cm. Aortic  valve mean gradient measures 6.0 mmHg. Aortic valve Vmax measures 1.76  m/s.   6. The inferior vena cava is normal in size with greater than 50%  respiratory variability, suggesting right atrial pressure of 3 mmHg.   FINDINGS   Left Ventricle: Left ventricular ejection fraction, by estimation, is 55  to 60%. The left ventricle has normal function. The left ventricle  demonstrates regional wall motion abnormalities. The left ventricular  internal cavity size was normal in size.  There is moderate left ventricular hypertrophy. Left ventricular diastolic  parameters are consistent with Grade II diastolic dysfunction  (pseudonormalization). Elevated left ventricular end-diastolic pressure.     LV Wall Scoring:  The apical septal segment, apical anterior segment, and apex are  hypokinetic.  The anterior wall, entire lateral wall, anterior septum, entire inferior  wall, mid inferoseptal segment, and basal inferoseptal segment are normal.   Right Ventricle: The right ventricular size is normal. No increase in  right ventricular wall thickness. Right ventricular systolic function is  normal. There is normal pulmonary artery systolic pressure. The tricuspid  regurgitant velocity is 1.57 m/s, and   with an assumed right atrial pressure of 3 mmHg, the estimated right  ventricular systolic pressure is 12.9 mmHg.   Left  Atrium: Left atrial size was mildly dilated.   Right Atrium: Right atrial size was normal in size.   Pericardium: There is no evidence of pericardial effusion.   Mitral Valve: The mitral valve is normal in structure. Severe mitral  annular calcification. No evidence of mitral valve regurgitation. No  evidence of mitral valve stenosis.   Tricuspid Valve: The tricuspid valve is normal in structure. Tricuspid  valve regurgitation is trivial. No evidence of tricuspid stenosis.   Aortic Valve: The aortic valve is tricuspid. There is moderate  calcification of the aortic valve. There is moderate thickening of the  aortic valve. Aortic valve regurgitation is not visualized. Aortic valve  sclerosis/calcification is present, without any   evidence of aortic stenosis. Aortic valve mean gradient measures 6.0  mmHg. Aortic valve peak gradient measures 12.3 mmHg. Aortic valve area, by  VTI measures 2.42 cm.   Pulmonic Valve: The pulmonic valve was normal in structure. Pulmonic valve  regurgitation is not visualized. No evidence of pulmonic stenosis.   Aorta: The aortic root is normal in size and structure.   Venous: The inferior vena cava is normal in size with greater than 50%  respiratory variability, suggesting right atrial pressure of 3 mmHg.   IAS/Shunts: No atrial level shunt detected by color flow Doppler.     LEFT VENTRICLE  PLAX 2D  LVIDd:         4.90 cm   Diastology  LVIDs:         3.20 cm   LV e' medial:    6.09 cm/s  LV PW:         1.50 cm   LV E/e' medial:  24.3  LV IVS:        1.20 cm   LV e' lateral:   5.00 cm/s  LVOT diam:     2.10 cm   LV E/e' lateral: 29.6  LV SV:         87  LV SV Index:   35  LVOT Area:     3.46  cm     RIGHT VENTRICLE             IVC  RV S prime:     10.60 cm/s  IVC diam: 2.00 cm  TAPSE (M-mode): 2.2 cm   LEFT ATRIUM             Index        RIGHT ATRIUM           Index  LA diam:        5.00 cm 2.04 cm/m   RA Area:     18.80 cm  LA Vol  (A2C):   86.2 ml 35.19 ml/m  RA Volume:   51.40 ml  20.98 ml/m  LA Vol (A4C):   65.0 ml 26.54 ml/m  LA Biplane Vol: 76.9 ml 31.39 ml/m   AORTIC VALVE  AV Area (Vmax):    2.12 cm  AV Area (Vmean):   2.18 cm  AV Area (VTI):     2.42 cm  AV Vmax:           175.50 cm/s  AV Vmean:          113.500 cm/s  AV VTI:            0.358 m  AV Peak Grad:      12.3 mmHg  AV Mean Grad:      6.0 mmHg  LVOT Vmax:         107.50 cm/s  LVOT Vmean:        71.550 cm/s  LVOT VTI:          0.250 m  LVOT/AV VTI ratio: 0.70    AORTA  Ao Root diam: 2.70 cm   MITRAL VALVE                TRICUSPID VALVE  MV Area (PHT): 2.99 cm     TR Peak grad:   9.9 mmHg  MV Decel Time: 254 msec     TR Vmax:        157.00 cm/s  MV E velocity: 148.00 cm/s  MV A velocity: 139.00 cm/s  SHUNTS  MV E/A ratio:  1.06         Systemic VTI:  0.25 m                              Systemic Diam: 2.10 cm   Maudine Sos MD  Electronically signed by Maudine Sos MD  Signature Date/Time: 07/25/2023/3:54:16 PM      Final (Updated)     Treatments: Operative Report    DATE OF PROCEDURE: 07/30/2023   PREOPERATIVE DIAGNOSIS: Severe three-vessel coronary artery disease status post non-ST elevation myocardial infarction and history of paroxysmal atrial fibrillation.   POSTOPERATIVE DIAGNOSIS: Severe three-vessel coronary artery disease status post non-ST elevation myocardial infarction and history of paroxysmal atrial fibrillation.   PROCEDURE:  Median sternotomy, extracorporeal circulation,  Coronary artery bypass grafting x3  Left internal mammary artery to the LAD,  Saphenous vein graft to OM1,  Saphenous vein graft to posterior descending with coronary endarterectomy, Endoscopic vein harvest right leg,  Left atrial appendage clip with 45 mm Medtronic Penditure clip.  Discharge Exam: Blood pressure 131/66, pulse 82, temperature 97.6 F (36.4 C), temperature source Oral, resp. rate 15, height 6' 2 (1.88 m), weight  122.9 kg, SpO2 98%. General appearance: alert, cooperative, and no distress Neurologic: intact Heart: irregularly irregular rhythm Lungs: diminished lung sounds bibasilar improved  Abdomen: soft, non-tender; bowel sounds normal; no masses,  no organomegaly and protuberant Extremities: edema 1+ BLE, TED hose in place Wound: Clean and dry without sign of infection, stable ecchymosis of EVH site   Discharge Medications:  The patient has been discharged on:   1.Beta Blocker:  Yes [  X ]                              No   [   ]                              If No, reason:  2.Ace Inhibitor/ARB: Yes [   ]                                     No  [  X  ]                                     If No, reason: Hypotension  3.Statin:   Yes [   ]                  No  [ X  ]                  If No, reason: Intolerance  4.Libby ReeValri Gee  [  X ]                  No   [   ]                  If No, reason:  Patient had ACS upon admission: Yes  Plavix/P2Y12 inhibitor: Yes [   ]                                      No  [  X ] On Eliquis  for Afib/aflutter     Discharge Instructions     Amb Referral to Cardiac Rehabilitation   Complete by: As directed    Diagnosis: CABG   CABG X ___: 2   After initial evaluation and assessments completed: Virtual Based Care may be provided alone or in conjunction with Phase 2 Cardiac Rehab based on patient barriers.: Yes   Intensive Cardiac Rehabilitation (ICR) [facility] location only OR Traditional Cardiac Rehabilitation (TCR) *If criteria for ICR are not met will enroll in TCR [facility] only): Yes      Allergies as of 08/10/2023       Reactions   Ace Inhibitors Other (See Comments)   Angioedema per pt report to admiting MD On 07/31/2019 - lisinopril    Gabapentin Swelling, Other (See Comments)    Influenza-like illness   Peanut Oil Shortness Of Breath   Swelling throat, sob  Other reaction(s): anaphylaxis   Tamiflu [oseltamivir Phosphate] Shortness Of Breath    Losartan Swelling   Other reaction(s): Lip swelling   Lyrica [pregabalin] Swelling   Oxcarbazepine Swelling, Other (See Comments)   Lip swelling   Statins Other (See Comments)   Myalgia -- atorvastatin, simvastatin   Avelox [moxifloxacin Hcl In Nacl] Rash   Ciprofloxacin  Anxiety   Citalopram Anxiety   Other  reaction(s): Drowsy, Anxiety   Victoza [liraglutide] Other (See Comments)   Pancreatitis per [facility]        Medication List     STOP taking these medications    sacubitril -valsartan  97-103 MG Commonly known as: ENTRESTO        TAKE these medications    acetaminophen  325 MG tablet Commonly known as: Tylenol  Take 2 tablets (650 mg total) by mouth every 6 (six) hours as needed for mild pain (pain score 1-3).   apixaban  5 MG Tabs tablet Commonly known as: ELIQUIS  Take 5 mg by mouth 2 (two) times daily.   aspirin  EC 81 MG tablet Take 1 tablet (81 mg total) by mouth daily. Swallow whole.   Azelastine HCl 137 MCG/SPRAY Soln Place 1 spray into both nostrils 2 (two) times daily.   carvedilol  12.5 MG tablet Commonly known as: COREG  Take 1 tablet (12.5 mg total) by mouth 2 (two) times daily. What changed:  medication strength how much to take   CoQ10 100 MG Caps Take 100 mg by mouth daily.   cromolyn  4 % ophthalmic solution Commonly known as: OPTICROM  Place 1 drop into both eyes 4 (four) times daily as needed (itchy eyes).   fluticasone  50 MCG/ACT nasal spray Commonly known as: FLONASE  Place 2 sprays into both nostrils 2 (two) times daily.   furosemide  20 MG tablet Commonly known as: LASIX  Take 1 tablet (20 mg total) by mouth daily. Start taking on: August 13, 2023 What changed: These instructions start on August 13, 2023. If you are unsure what to do until then, ask your doctor or other care provider.   GLUCOSAMINE 1500 COMPLEX PO Take 1 tablet by mouth every evening.   hydrOXYzine  25 MG capsule Commonly known as: VISTARIL  Take 25 mg by mouth 2 (two) times  daily as needed (for sleep per patient).   insulin  aspart 100 UNIT/ML injection Commonly known as: novoLOG  Inject 0-8 Units into the skin 3 (three) times daily as needed for high blood sugar.   insulin  glargine 100 UNIT/ML injection Commonly known as: LANTUS  Inject 20 Units into the skin 2 (two) times daily.   MAGNESIUM  OXIDE PO Take 500 mg by mouth at bedtime.   multivitamin with minerals Tabs tablet Take 1 tablet by mouth every evening.   neomycin-polymyxin-hydrocortisone OTIC solution Commonly known as: CORTISPORIN Place 4 drops into the right ear 4 (four) times daily as needed (itchy ears).   olopatadine  0.1 % ophthalmic solution Commonly known as: PATANOL Place 1 drop into both eyes 2 (two) times daily.   OVER THE COUNTER MEDICATION Take 1 capsule by mouth at bedtime. Oil of Oregano   oxyCODONE  5 MG immediate release tablet Commonly known as: Oxy IR/ROXICODONE  Take 1 tablet (5 mg total) by mouth every 6 (six) hours as needed for severe pain (pain score 7-10).   polyethylene glycol powder 17 GM/SCOOP powder Commonly known as: GLYCOLAX /MIRALAX  Take 34 g by mouth at bedtime.   potassium gluconate 595 (99 K) MG Tabs tablet Take 595 mg by mouth every evening.   PROBIOTIC DAILY PO Take 2 capsules by mouth every evening.   Spiriva  Respimat 2.5 MCG/ACT Aers Generic drug: Tiotropium Bromide  Monohydrate Inhale 2 puffs into the lungs daily.   tadalafil 20 MG tablet Commonly known as: CIALIS Take 20 mg by mouth daily as needed for erectile dysfunction.   terazosin  2 MG capsule Commonly known as: HYTRIN  Take 2 mg by mouth at bedtime.   traZODone  100 MG tablet Commonly known as: DESYREL  Take 100  mg by mouth at bedtime.   TURMERIC PO Take 1 tablet by mouth every evening. With Ginger   Vitamin A 2400 MCG (8000 UT) Caps Take 8,000 Units by mouth every evening.   vitamin B-12 500 MCG tablet Commonly known as: CYANOCOBALAMIN Take 500 mcg by mouth every evening.    Vitamin D 50 MCG (2000 UT) Caps Take 1 capsule by mouth every evening.        Follow-up Information     Zelphia Higashi, MD Follow up on 09/04/2023.   Specialty: Cardiothoracic Surgery Why: Appointment is at 9:45, please get CXR at 8:45 on 2nd floor of our office building prior to your appointment Contact information: [address] [place] [address] [phone]         Koren Persons D, NP Follow up on 08/20/2023.   Specialty: Cardiology Why: Appointment is at 10:05 Contact information: [address] [place] [address] [phone]                 Signed:  Angela Barban, PA-C  08/10/2023, 7:40 AM

## 2023-07-31 NOTE — Progress Notes (Signed)
 Patient ID: Alan Margo., male   DOB: 11-24-1950, 73 y.o.   MRN: [id number]  TCTS Evening Rounds:  Hemodynamically stable in sinus rhythm. Milrinone 0.125 Sats 100%  CT output low. UO good.   BMET    Component Value Date/Time   NA 127 (L) 07/31/2023 1536   K 4.0 07/31/2023 1536   CL 92 (L) 07/31/2023 1536   CO2 22 07/31/2023 1536   GLUCOSE 202 (H) 07/31/2023 1536   BUN 13 07/31/2023 1536   CREATININE 0.95 07/31/2023 1536   CALCIUM  7.8 (L) 07/31/2023 1536   GFRNONAA >60 07/31/2023 1536   CBC    Component Value Date/Time   WBC 12.3 (H) 07/31/2023 1536   RBC 3.39 (L) 07/31/2023 1536   HGB 10.2 (L) 07/31/2023 1536   HCT 30.7 (L) 07/31/2023 1536   PLT 172 07/31/2023 1536   MCV 90.6 07/31/2023 1536   MCV 79.4 (A) 01/29/2014 2119   MCH 30.1 07/31/2023 1536   MCHC 33.2 07/31/2023 1536   RDW 13.6 07/31/2023 1536   LYMPHSABS 1.8 07/31/2020 0106   MONOABS 0.8 07/31/2020 0106   EOSABS 0.2 07/31/2020 0106   BASOSABS 0.1 07/31/2020 0106   Doing well overall.

## 2023-07-31 NOTE — Anesthesia Postprocedure Evaluation (Signed)
 Anesthesia Post Note  Patient: [name].  Procedure(s) Performed: CORONARY ARTERY BYPASS GRAFTING (CABG) TIMES THREE USING THE LEFT INTERNAL MAMMARY ARTERY AND ENDOSCOPICALLY HARVESTED RIGHT GREATER SAPHENOUS VEIN (Chest) ECHOCARDIOGRAM, TRANSESOPHAGEAL, INTRAOPERATIVE CLIPPING, LEFT ATRIAL APPENDAGE (Left)     Patient location during evaluation: SICU Anesthesia Type: General Level of consciousness: sedated and patient cooperative Pain management: pain level not controlled Vital Signs Assessment: post-procedure vital signs reviewed and stable Respiratory status: spontaneous breathing Cardiovascular status: stable Anesthetic complications: no   No notable events documented.  Last Vitals:  Vitals:   07/31/23 0632 07/31/23 0700  BP:  (!) 111/56  Pulse: 72 71  Resp: 18 18  Temp: (!) 36.4 C (!) 36.4 C  SpO2: 97% 98%    Last Pain:  Vitals:   07/31/23 0626  TempSrc:   PainSc: 10-Worst pain ever                 Gorman Laughter

## 2023-08-01 ENCOUNTER — Inpatient Hospital Stay (HOSPITAL_COMMUNITY)

## 2023-08-01 LAB — GLUCOSE, CAPILLARY
Glucose-Capillary: 126 mg/dL — ABNORMAL HIGH (ref 70–99)
Glucose-Capillary: 135 mg/dL — ABNORMAL HIGH (ref 70–99)
Glucose-Capillary: 141 mg/dL — ABNORMAL HIGH (ref 70–99)
Glucose-Capillary: 142 mg/dL — ABNORMAL HIGH (ref 70–99)
Glucose-Capillary: 178 mg/dL — ABNORMAL HIGH (ref 70–99)
Glucose-Capillary: 197 mg/dL — ABNORMAL HIGH (ref 70–99)

## 2023-08-01 LAB — BASIC METABOLIC PANEL WITH GFR
Anion gap: 9 (ref 5–15)
BUN: 15 mg/dL (ref 8–23)
CO2: 26 mmol/L (ref 22–32)
Calcium: 8 mg/dL — ABNORMAL LOW (ref 8.9–10.3)
Chloride: 90 mmol/L — ABNORMAL LOW (ref 98–111)
Creatinine, Ser: 0.97 mg/dL (ref 0.61–1.24)
GFR, Estimated: >60 mL/min (ref >60)
Glucose, Bld: 222 mg/dL — ABNORMAL HIGH (ref 70–99)
Potassium: 4.1 mmol/L (ref 3.5–5.1)
Sodium: 125 mmol/L — ABNORMAL LOW (ref 135–145)

## 2023-08-01 LAB — CBC
HCT: 30.6 % — ABNORMAL LOW (ref 39.0–52.0)
Hemoglobin: 10.2 g/dL — ABNORMAL LOW (ref 13.0–17.0)
MCH: 30.4 pg (ref 26.0–34.0)
MCHC: 33.3 g/dL (ref 30.0–36.0)
MCV: 91.1 fL (ref 80.0–100.0)
Platelets: 172 10*3/uL (ref 150–400)
RBC: 3.36 MIL/uL — ABNORMAL LOW (ref 4.22–5.81)
RDW: 13.6 % (ref 11.5–15.5)
WBC: 10.4 10*3/uL (ref 4.0–10.5)
nRBC: 0 % (ref 0.0–0.2)

## 2023-08-01 LAB — SURGICAL PATHOLOGY

## 2023-08-01 MED ORDER — APIXABAN 5 MG PO TABS
5.0000 mg | ORAL_TABLET | Freq: Two times a day (BID) | ORAL | Status: DC
  Administered 2023-08-02 – 2023-08-10 (×17): 5 mg via ORAL
  Filled 2023-08-01 (×17): qty 1

## 2023-08-01 MED ORDER — SODIUM CHLORIDE 0.9% FLUSH
3.0000 mL | Freq: Two times a day (BID) | INTRAVENOUS | Status: DC
  Administered 2023-08-01 – 2023-08-10 (×16): 3 mL via INTRAVENOUS

## 2023-08-01 MED ORDER — SODIUM CHLORIDE 0.9 % IV SOLN: 250.0000 mL | INTRAVENOUS | Status: AC | PRN

## 2023-08-01 MED ORDER — ALUM & MAG HYDROXIDE-SIMETH 200-200-20 MG/5ML PO SUSP
15.0000 mL | Freq: Four times a day (QID) | ORAL | Status: DC | PRN
  Administered 2023-08-05 – 2023-08-10 (×4): 15 mL via ORAL
  Filled 2023-08-01 (×5): qty 30

## 2023-08-01 MED ORDER — INSULIN GLARGINE-YFGN 100 UNIT/ML ~~LOC~~ SOLN
30.0000 [IU] | Freq: Two times a day (BID) | SUBCUTANEOUS | Status: DC
  Administered 2023-08-01 – 2023-08-04 (×7): 30 [IU] via SUBCUTANEOUS
  Filled 2023-08-01 (×9): qty 0.3

## 2023-08-01 MED ORDER — ASPIRIN 81 MG PO TBEC
81.0000 mg | DELAYED_RELEASE_TABLET | Freq: Every day | ORAL | Status: DC
  Administered 2023-08-01 – 2023-08-10 (×10): 81 mg via ORAL
  Filled 2023-08-01 (×9): qty 1

## 2023-08-01 MED ORDER — SODIUM CHLORIDE 0.9% FLUSH: 3.0000 mL | INTRAVENOUS | Status: DC | PRN

## 2023-08-01 MED ORDER — FUROSEMIDE 40 MG PO TABS
40.0000 mg | ORAL_TABLET | Freq: Two times a day (BID) | ORAL | Status: DC
  Administered 2023-08-01 (×2): 40 mg via ORAL
  Filled 2023-08-01 (×2): qty 1

## 2023-08-01 MED ORDER — TIOTROPIUM BROMIDE MONOHYDRATE 2.5 MCG/ACT IN AERS
2.0000 | INHALATION_SPRAY | Freq: Every day | RESPIRATORY_TRACT | Status: DC
  Administered 2023-08-01 – 2023-08-10 (×9): 2 via RESPIRATORY_TRACT

## 2023-08-01 MED ORDER — TRAMADOL HCL 50 MG PO TABS
50.0000 mg | ORAL_TABLET | Freq: Four times a day (QID) | ORAL | Status: DC | PRN
  Administered 2023-08-01 – 2023-08-07 (×9): 50 mg via ORAL
  Filled 2023-08-01 (×10): qty 1

## 2023-08-01 MED ORDER — TRAZODONE HCL 50 MG PO TABS: 100.0000 mg | ORAL_TABLET | Freq: Every evening | ORAL | Status: DC | PRN

## 2023-08-01 MED ORDER — CARVEDILOL 12.5 MG PO TABS
12.5000 mg | ORAL_TABLET | Freq: Two times a day (BID) | ORAL | Status: DC
  Administered 2023-08-01 – 2023-08-10 (×18): 12.5 mg via ORAL
  Filled 2023-08-01 (×20): qty 1

## 2023-08-01 MED ORDER — ~~LOC~~ CARDIAC SURGERY, PATIENT & FAMILY EDUCATION
Freq: Once | Status: AC
  Filled 2023-08-01: qty 1

## 2023-08-01 MED ORDER — INSULIN ASPART 100 UNIT/ML IJ SOLN
0.0000 [IU] | Freq: Three times a day (TID) | INTRAMUSCULAR | Status: DC
  Administered 2023-08-01: 3 [IU] via SUBCUTANEOUS
  Administered 2023-08-01: 4 [IU] via SUBCUTANEOUS
  Administered 2023-08-02: 3 [IU] via SUBCUTANEOUS
  Administered 2023-08-02 – 2023-08-05 (×3): 4 [IU] via SUBCUTANEOUS
  Administered 2023-08-06: 7 [IU] via SUBCUTANEOUS
  Administered 2023-08-06: 4 [IU] via SUBCUTANEOUS
  Administered 2023-08-07: 6 [IU] via SUBCUTANEOUS
  Administered 2023-08-07: 3 [IU] via SUBCUTANEOUS
  Administered 2023-08-07 – 2023-08-09 (×4): 4 [IU] via SUBCUTANEOUS
  Administered 2023-08-10: 3 [IU] via SUBCUTANEOUS

## 2023-08-01 MED ORDER — POTASSIUM CHLORIDE ER 10 MEQ PO TBCR
20.0000 meq | EXTENDED_RELEASE_TABLET | Freq: Two times a day (BID) | ORAL | Status: DC
  Administered 2023-08-01 – 2023-08-05 (×9): 20 meq via ORAL
  Filled 2023-08-01 (×19): qty 2

## 2023-08-01 MED ORDER — INSULIN ASPART 100 UNIT/ML IJ SOLN
0.0000 [IU] | Freq: Every day | INTRAMUSCULAR | Status: DC
  Administered 2023-08-05: 2 [IU] via SUBCUTANEOUS

## 2023-08-01 MED FILL — Nitroglycerin IV Soln 200 MCG/ML in D5W: INTRAVENOUS | Qty: 250 | Status: AC

## 2023-08-01 NOTE — Progress Notes (Signed)
 2 Days Post-Op Procedure(s) (LRB): CORONARY ARTERY BYPASS GRAFTING (CABG) TIMES THREE USING THE LEFT INTERNAL MAMMARY ARTERY AND ENDOSCOPICALLY HARVESTED RIGHT GREATER SAPHENOUS VEIN (N/A) ECHOCARDIOGRAM, TRANSESOPHAGEAL, INTRAOPERATIVE (N/A) CLIPPING, LEFT ATRIAL APPENDAGE (Left) Subjective: Still some incisional pain Some difficulty with balance due to neuropathy  Objective: Vital signs in last 24 hours: Temp:  [97.5 F (36.4 C)] 97.5 F (36.4 C) (06/04 0700) Pulse Rate:  [69-76] 73 (06/04 0800) Cardiac Rhythm: Normal sinus rhythm (06/03 1930) Resp:  [14-26] 19 (06/04 0800) BP: (99-150)/(57-74) 119/61 (06/04 0800) SpO2:  [92 %-100 %] 98 % (06/04 0800) Arterial Line BP: (114-149)/(45-56) 147/53 (06/03 1500) Weight:  [129.7 kg] 129.7 kg (06/04 0500)  Hemodynamic parameters for last 24 hours: PAP: (33)/(18) 33/18  Intake/Output from previous day: 06/03 0701 - 06/04 0700 In: 1493.9 [P.O.:820; I.V.:323.9; IV Piggyback:350] Out: 2145 [Urine:2095; Chest Tube:50] Intake/Output this shift: No intake/output data recorded.  General appearance: alert, cooperative, and no distress Neurologic: intact Heart: regular rate and rhythm Lungs: diminished breath sounds bibasilar Abdomen: normal findings: spleen non-palpable Abd- soft, nontender  Lab Results: Recent Labs    07/31/23 1536 08/01/23 0549  WBC 12.3* 10.4  HGB 10.2* 10.2*  HCT 30.7* 30.6*  PLT 172 172   BMET:  Recent Labs    07/31/23 1536 08/01/23 0549  NA 127* 125*  K 4.0 4.1  CL 92* 90*  CO2 22 26  GLUCOSE 202* 222*  BUN 13 15  CREATININE 0.95 0.97  CALCIUM  7.8* 8.0*    PT/INR:  Recent Labs    07/30/23 1905  LABPROT 17.0*  INR 1.4*   ABG    Component Value Date/Time   PHART 7.330 (L) 07/31/2023 0134   HCO3 24.8 07/31/2023 0134   TCO2 26 07/31/2023 0134   ACIDBASEDEF 1.0 07/31/2023 0134   O2SAT 97 07/31/2023 0134   CBG (last 3)  Recent Labs    08/01/23 0031 08/01/23 0459 08/01/23 0740   GLUCAP 142* 126* 135*    Assessment/Plan: S/P Procedure(s) (LRB): CORONARY ARTERY BYPASS GRAFTING (CABG) TIMES THREE USING THE LEFT INTERNAL MAMMARY ARTERY AND ENDOSCOPICALLY HARVESTED RIGHT GREATER SAPHENOUS VEIN (N/A) ECHOCARDIOGRAM, TRANSESOPHAGEAL, INTRAOPERATIVE (N/A) CLIPPING, LEFT ATRIAL APPENDAGE (Left) POD # 2 NEURO- intact CV- in Sr   Dc milrinone  ASA, resume Eliquis  tomorrow  Beta blocker- switch to home med Coreg   Resume entresto prior to DC  Dc pacing wires RESP- IS for atelectasis RENAL- creatinine normal  Continue diuresis  Hyponatremia- monitor ENDO- CBG controlled for most part with Semglee  + SSI  Change SSI to AC and HS GI- tolerating diet SCD + enoxaparin  for DVT prophylaxis Mobility- PT consult   LOS: 8 days    [name] 08/01/2023

## 2023-08-01 NOTE — Plan of Care (Signed)

## 2023-08-01 NOTE — Plan of Care (Signed)
  Problem: Skin Integrity: Goal: Risk for impaired skin integrity will decrease Outcome: Progressing   Problem: Elimination: Goal: Will not experience complications related to bowel motility Outcome: Progressing   Problem: Pain Managment: Goal: General experience of comfort will improve and/or be controlled Outcome: Progressing

## 2023-08-02 ENCOUNTER — Inpatient Hospital Stay (HOSPITAL_COMMUNITY)

## 2023-08-02 LAB — CBC
HCT: 30.6 % — ABNORMAL LOW (ref 39.0–52.0)
Hemoglobin: 10.6 g/dL — ABNORMAL LOW (ref 13.0–17.0)
MCH: 31.1 pg (ref 26.0–34.0)
MCHC: 34.6 g/dL (ref 30.0–36.0)
MCV: 89.7 fL (ref 80.0–100.0)
Platelets: 182 10*3/uL (ref 150–400)
RBC: 3.41 MIL/uL — ABNORMAL LOW (ref 4.22–5.81)
RDW: 13.6 % (ref 11.5–15.5)
WBC: 9.4 10*3/uL (ref 4.0–10.5)
nRBC: 0 % (ref 0.0–0.2)

## 2023-08-02 LAB — BASIC METABOLIC PANEL WITH GFR
Anion gap: 5 (ref 5–15)
BUN: 20 mg/dL (ref 8–23)
CO2: 27 mmol/L (ref 22–32)
Calcium: 8.3 mg/dL — ABNORMAL LOW (ref 8.9–10.3)
Chloride: 94 mmol/L — ABNORMAL LOW (ref 98–111)
Creatinine, Ser: 0.82 mg/dL (ref 0.61–1.24)
GFR, Estimated: >60 mL/min (ref >60)
Glucose, Bld: 142 mg/dL — ABNORMAL HIGH (ref 70–99)
Potassium: 5 mmol/L (ref 3.5–5.1)
Sodium: 126 mmol/L — ABNORMAL LOW (ref 135–145)

## 2023-08-02 LAB — GLUCOSE, CAPILLARY
Glucose-Capillary: 131 mg/dL — ABNORMAL HIGH (ref 70–99)
Glucose-Capillary: 154 mg/dL — ABNORMAL HIGH (ref 70–99)
Glucose-Capillary: 157 mg/dL — ABNORMAL HIGH (ref 70–99)
Glucose-Capillary: 140 mg/dL — ABNORMAL HIGH (ref 70–99)

## 2023-08-02 LAB — ECHO INTRAOPERATIVE TEE
AV Mean grad: 13 mmHg
AV Peak grad: 13.5 mmHg
Ao pk vel: 1.84 m/s
Height: 74 in
Weight: 4211.67 [oz_av]

## 2023-08-02 MED ORDER — METOCLOPRAMIDE HCL 5 MG/ML IJ SOLN
10.0000 mg | Freq: Four times a day (QID) | INTRAMUSCULAR | Status: AC
  Administered 2023-08-02 (×3): 10 mg via INTRAVENOUS
  Filled 2023-08-02 (×3): qty 2

## 2023-08-02 MED ORDER — FUROSEMIDE 10 MG/ML IJ SOLN
40.0000 mg | Freq: Two times a day (BID) | INTRAMUSCULAR | Status: DC
  Administered 2023-08-02 – 2023-08-06 (×10): 40 mg via INTRAVENOUS
  Filled 2023-08-02 (×10): qty 4

## 2023-08-02 MED FILL — Magnesium Sulfate Inj 50%: INTRAMUSCULAR | Qty: 10 | Status: AC

## 2023-08-02 MED FILL — Heparin Sodium (Porcine) Inj 1000 Unit/ML: INTRAMUSCULAR | Qty: 30 | Status: AC

## 2023-08-02 MED FILL — Heparin Sodium (Porcine) Inj 1000 Unit/ML: Qty: 1000 | Status: AC

## 2023-08-02 MED FILL — Lidocaine HCl Local Soln Prefilled Syringe 100 MG/5ML (2%): INTRAMUSCULAR | Qty: 5 | Status: AC

## 2023-08-02 MED FILL — Electrolyte-R (PH 7.4) Solution: INTRAVENOUS | Qty: 4000 | Status: AC

## 2023-08-02 MED FILL — Sodium Bicarbonate IV Soln 8.4%: INTRAVENOUS | Qty: 50 | Status: AC

## 2023-08-02 MED FILL — Potassium Chloride Inj 2 mEq/ML: INTRAVENOUS | Qty: 40 | Status: AC

## 2023-08-02 MED FILL — Sodium Chloride IV Soln 0.9%: INTRAVENOUS | Qty: 2000 | Status: AC

## 2023-08-02 MED FILL — Mannitol IV Soln 20%: INTRAVENOUS | Qty: 500 | Status: AC

## 2023-08-02 NOTE — Evaluation (Signed)
 Physical Therapy Evaluation Patient Details Name: [name]. MRN: [id number] DOB: [birth year] Today's Date: 08/02/2023  History of Present Illness  Pt is a 73 y/o M admitted on 07/24/23 after presenting with c/o SOB. Pt found to have NSTEMI, now s/p L/R [facility] which showed significant LAD stenosis. 6/3 CABG PMH: DM2, a-fib on Eliquis , HTN, HLD, RBBB, OSA on CPAP, anxiety, diverticulitis, neuropathy, Hepatitis, SDH  Clinical Impression   Pt admitted secondary to problem above with deficits below. PTA patient was independent and cared for his wife (Parkinson's disease).  Pt currently requires +2 min assist to come to stand with max cues for technique. He was able to ambulate 8 ft with min assist, RW, and close chair follow. He reported dizziness as reason he needed to sit. BP stable; ?due to pain medication ~1 hour prior to PT arrival. Anticipate patient will benefit from PT to address problems listed below. Will continue to follow acutely to maximize functional mobility, independence, and safety.  Patient could benefit from intensive inpatient therapies >3 hrs/day.          If plan is discharge home, recommend the following: Two people to help with walking and/or transfers;Assistance with cooking/housework;Assist for transportation;Help with stairs or ramp for entrance   Can travel by private vehicle        Equipment Recommendations None recommended by PT  Recommendations for Other Services  Rehab consult    Functional Status Assessment Patient has had a recent decline in their functional status and demonstrates the ability to make significant improvements in function in a reasonable and predictable amount of time.     Precautions / Restrictions Precautions Precautions: Sternal;Fall Precaution Booklet Issued: Yes (comment) Recall of Precautions/Restrictions: Intact Precaution/Restrictions Comments: pt aware of "move in the tube", cues to generalize Restrictions Weight Bearing  Restrictions Per Provider Order: No      Mobility  Bed Mobility               General bed mobility comments: received in chair    Transfers Overall transfer level: Needs assistance Equipment used: Rolling walker (2 wheels) Transfers: Sit to/from Stand Sit to Stand: +2 physical assistance, Min assist           General transfer comment: assist initially to scoot buttocks to edge of chair, cues for hands on knees and use of momentum, assist to rise and steady    Ambulation/Gait Ambulation/Gait assistance: Min assist, +2 safety/equipment Gait Distance (Feet): 8 Feet Assistive device: Rolling walker (2 wheels) Gait Pattern/deviations: Decreased stride length Gait velocity: decreased Gait velocity interpretation: <1.8 ft/sec, indicate of risk for recurrent falls   General Gait Details: limited by dizziness and need to sit down; ?med related  Stairs            Wheelchair Mobility     Tilt Bed    Modified Rankin (Stroke Patients Only)       Balance Overall balance assessment: Needs assistance   Sitting balance-Leahy Scale: Fair       Standing balance-Leahy Scale: Poor Standing balance comment: reliant on RW                             Pertinent Vitals/Pain Pain Assessment Pain Assessment: Faces Faces Pain Scale: Hurts little more Pain Location: incision Pain Descriptors / Indicators: Discomfort, Operative site guarding, Sore Pain Intervention(s): Premedicated before session, Limited activity within patient's tolerance, Monitored during session    Home Living Family/patient expects  to be discharged to:: Private residence Living Arrangements: Spouse/significant other Available Help at Discharge: Family;Available 24 hours/day Type of Home: House Home Access: Stairs to enter   [employer] of Steps: 1   Home Layout: One level Home Equipment: Hand held shower head;Shower Counsellor (2 wheels);Rollator (4  wheels);Cane - quad;Cane - single point Additional Comments: Cares for wife who has Parkinson's (provides PRN physical assistance) & daughters assist him daily    Prior Function Prior Level of Function : Independent/Modified Independent;Driving             Mobility Comments: cares for wife who has Parkinsons; daughters assist ADLs Comments: denies falls, independent with bathing & dressing     Extremity/Trunk Assessment   Upper Extremity Assessment Upper Extremity Assessment: Defer to OT evaluation    Lower Extremity Assessment Lower Extremity Assessment: Generalized weakness (h/o neuropathy bil LEs)    Cervical / Trunk Assessment Cervical / Trunk Assessment: Normal (increased body habitus)  Communication   Communication Communication: Impaired Factors Affecting Communication: Hearing impaired    Cognition Arousal: Alert Behavior During Therapy: WFL for tasks assessed/performed                             Following commands: Intact       Cueing Cueing Techniques: Verbal cues     General Comments General comments (skin integrity, edema, etc.): On 3L O2 with sats >95% throughout; HR 109-126 bpm; BP stable after walking    Exercises     Assessment/Plan    PT Assessment Patient needs continued PT services  PT Problem List Decreased strength;Decreased activity tolerance;Decreased balance;Decreased mobility;Decreased knowledge of use of DME;Decreased knowledge of precautions;Cardiopulmonary status limiting activity;Impaired sensation;Decreased skin integrity;Obesity;Pain       PT Treatment Interventions DME instruction;Gait training;Functional mobility training;Therapeutic activities;Therapeutic exercise;Balance training;Patient/family education    PT Goals (Current goals can be found in the Care Plan section)  Acute Rehab PT Goals Patient Stated Goal: go for some rehab PT Goal Formulation: With patient Time For Goal Achievement: 08/16/23 Potential  to Achieve Goals: Good    Frequency Min 2X/week     Co-evaluation PT/OT/SLP Co-Evaluation/Treatment: Yes Reason for Co-Treatment: For patient/therapist safety PT goals addressed during session: Mobility/safety with mobility;Proper use of DME OT goals addressed during session: ADL's and self-care       AM-PAC PT "6 Clicks" Mobility  Outcome Measure Help needed turning from your back to your side while in a flat bed without using bedrails?: A Lot Help needed moving from lying on your back to sitting on the side of a flat bed without using bedrails?: A Lot Help needed moving to and from a bed to a chair (including a wheelchair)?: Total Help needed standing up from a chair using your arms (e.g., wheelchair or bedside chair)?: Total Help needed to walk in hospital room?: Total Help needed climbing 3-5 steps with a railing? : Total 6 Click Score: 8    End of Session Equipment Utilized During Treatment: Gait belt;Oxygen  Activity Tolerance: Treatment limited secondary to medical complications (Comment) (dizziness) Patient left: with call bell/phone within reach;in chair Nurse Communication: Mobility status;Other (comment) (dizziness with ambulation) PT Visit Diagnosis: Muscle weakness (generalized) (M62.81);Difficulty in walking, not elsewhere classified (R26.2);Pain Pain - part of body:  (chest)    Time: 1610-9604 PT Time Calculation (min) (ACUTE ONLY): 27 min   Charges:   PT Evaluation $PT Eval Low Complexity: 1 Low   PT General Charges $$ ACUTE  PT VISIT: 1 Visit          [name], PT Acute Rehabilitation Services  Office [phone]   [name] 08/02/2023, 4:37 PM

## 2023-08-02 NOTE — Progress Notes (Addendum)
      [address]       [place] [address]             [phone]      3 Days Post-Op Procedure(s) (LRB): CORONARY ARTERY BYPASS GRAFTING (CABG) TIMES THREE USING THE LEFT INTERNAL MAMMARY ARTERY AND ENDOSCOPICALLY HARVESTED RIGHT GREATER SAPHENOUS VEIN (N/A) ECHOCARDIOGRAM, TRANSESOPHAGEAL, INTRAOPERATIVE (N/A) CLIPPING, LEFT ATRIAL APPENDAGE (Left)  Subjective:  Patient states don't ask with how he is doing.  He continues to have pain.  Also complains of nausea, no vomiting.  He has moved his bowels.  Objective: Vital signs in last 24 hours: Temp:  [97.3 F (36.3 C)-99 F (37.2 C)] 98.9 F (37.2 C) (06/05 0147) Pulse Rate:  [70-100] 95 (06/05 0147) Cardiac Rhythm: Atrial fibrillation;Bundle branch block (06/04 2208) Resp:  [18-23] 18 (06/04 2306) BP: (105-139)/(56-91) 139/79 (06/05 0147) SpO2:  [94 %-99 %] 98 % (06/05 0147) Weight:  [129.4 kg] 129.4 kg (06/05 0338)  Intake/Output from previous day: 06/04 0701 - 06/05 0700 In: 648.7 [P.O.:360; I.V.:88.7; IV Piggyback:200] Out: -   General appearance: alert, cooperative, and no distress Heart: regular rate and rhythm Lungs: diminished breath sounds on left Abdomen: soft, non-tender; bowel sounds normal; no masses,  no organomegaly Extremities: edema + pitting Wound: clean and dry  Lab Results: Recent Labs    08/01/23 0549 08/02/23 0254  WBC 10.4 9.4  HGB 10.2* 10.6*  HCT 30.6* 30.6*  PLT 172 182   BMET:  Recent Labs    08/01/23 0549 08/02/23 0254  NA 125* 126*  K 4.1 5.0  CL 90* 94*  CO2 26 27  GLUCOSE 222* 142*  BUN 15 20  CREATININE 0.97 0.82  CALCIUM  8.0* 8.3*    PT/INR:  Recent Labs    07/30/23 1905  LABPROT 17.0*  INR 1.4*   ABG    Component Value Date/Time   PHART 7.330 (L) 07/31/2023 0134   HCO3 24.8 07/31/2023 0134   TCO2 26 07/31/2023 0134   ACIDBASEDEF 1.0 07/31/2023 0134   O2SAT 97 07/31/2023 0134   CBG (last 3)  Recent Labs    08/01/23 1626  08/01/23 2105 08/02/23 0611  GLUCAP 178* 141* 131*    Assessment/Plan: S/P Procedure(s) (LRB): CORONARY ARTERY BYPASS GRAFTING (CABG) TIMES THREE USING THE LEFT INTERNAL MAMMARY ARTERY AND ENDOSCOPICALLY HARVESTED RIGHT GREATER SAPHENOUS VEIN (N/A) ECHOCARDIOGRAM, TRANSESOPHAGEAL, INTRAOPERATIVE (N/A) CLIPPING, LEFT ATRIAL APPENDAGE (Left)  CV-maintaining NSR, BP is ranging 100-140s- on Coreg , home Eliquis  resumed.. BP is a little low yet to resume Entresto Pulm- on RA.[name] CXR with continued left pleural effusion, atelectasis.. patients respiratory effort is poor due to pain.[name] discussed with importance of taking deep breaths and continue use of IS Renal-creatinine has remained stable, + pitting edema on exam... U/O decreased yesterday down to 750 cc.. on Lasix  40 mg BID w/o much response.[name] discussed with Dr. Luna Salinas will change to IV Lasix  40 mg BID, K is at 5 supplementation ordered at 20 meq BID...  monitor closely GI- tolerating diet, complains of nausea, will give a few doses of scheduled reglan , continue stool softners prn DM- sugars well controlled, continue semglee , SSIP  Deconditioning- mobility has been limited with neuropathy, PT evaluation requested and is pending   LOS: 9 days   Alan Kasal, PA-C 08/02/2023 Patient seen and examined, agree with findings and plan outlined above Diurese Mobilize- PT/OT  Milon Aloe. Luna Salinas, MD [facility] [phone]

## 2023-08-02 NOTE — Evaluation (Signed)
 Occupational Therapy Evaluation Patient Details Name: [name]. MRN: [id number] DOB: 06-08-1950 Today's Date: 08/02/2023   History of Present Illness   Pt is a 73 y/o M admitted on 07/24/23 after presenting with c/o SOB. Pt found to have NSTEMI, now s/p L/R [facility] which showed significant LAD stenosis. 6/3 CABG PMH: DM2, a-fib on Eliquis , HTN, HLD, RBBB, OSA on CPAP, anxiety, diverticulitis, neuropathy, Hepatitis, SDH     Clinical Impressions Pt was independent and the caregiver of his wife prior to admission. Presents with post operative pain, generalized weakness, impaired standing balance and decreased activity tolerance. Pt requires +2 min assist with use of momentum to stand and min assist with close chair follow for short distance ambulation due to dizziness. HR to 125, pt dependent on 3L O2 to maintain oxygen  > 90%.  Pt had received pain meds just prior to therapy eval. Pt needs set up to total assist for ADLs. Patient will benefit from intensive inpatient follow-up therapy, >3 hours/day.      If plan is discharge home, recommend the following:   Two people to help with walking and/or transfers;Two people to help with bathing/dressing/bathroom;Assistance with cooking/housework;Assist for transportation;Help with stairs or ramp for entrance     Functional Status Assessment   Patient has had a recent decline in their functional status and demonstrates the ability to make significant improvements in function in a reasonable and predictable amount of time.     Equipment Recommendations   Other (comment) (defer to next venue)     Recommendations for Other Services   Rehab consult     Precautions/Restrictions   Precautions Precautions: Sternal;Fall Precaution Booklet Issued: Yes (comment) Recall of Precautions/Restrictions: Intact Precaution/Restrictions Comments: pt aware of "move in the tube", cues to generalize Restrictions Weight Bearing Restrictions Per  Provider Order: No     Mobility Bed Mobility               General bed mobility comments: received in chair    Transfers Overall transfer level: Needs assistance Equipment used: Rolling walker (2 wheels) Transfers: Sit to/from Stand Sit to Stand: +2 physical assistance, Min assist           General transfer comment: assist initially to scoot buttocks to edge of chair, cues for hands on knees and use of momentum, assist to rise and steady      Balance Overall balance assessment: Needs assistance   Sitting balance-Leahy Scale: Fair       Standing balance-Leahy Scale: Poor Standing balance comment: reliant on RW                           ADL either performed or assessed with clinical judgement   ADL Overall ADL's : Needs assistance/impaired Eating/Feeding: Independent;Sitting   Grooming: Set up;Sitting   Upper Body Bathing: Minimal assistance;Sitting   Lower Body Bathing: Total assistance;+2 for physical assistance;Sit to/from stand   Upper Body Dressing : Moderate assistance;Sitting   Lower Body Dressing: Total assistance;+2 for physical assistance;Sit to/from stand   Toilet Transfer: Minimal assistance;+2 for safety/equipment;Ambulation   Toileting- Clothing Manipulation and Hygiene: Total assistance;+2 for physical assistance;Sit to/from stand       Functional mobility during ADLs: Minimal assistance;+2 for safety/equipment;Rolling walker (2 wheels)       Vision Baseline Vision/History: 1 Wears glasses Ability to See in Adequate Light: 0 Adequate Patient Visual Report: No change from baseline       Perception  Praxis         Pertinent Vitals/Pain Pain Assessment Pain Assessment: Faces Faces Pain Scale: Hurts little more Pain Location: incision Pain Descriptors / Indicators: Discomfort, Operative site guarding, Sore Pain Intervention(s): Premedicated before session, Monitored during session     Extremity/Trunk  Assessment Upper Extremity Assessment Upper Extremity Assessment: Overall WFL for tasks assessed   Lower Extremity Assessment Lower Extremity Assessment: Defer to PT evaluation   Cervical / Trunk Assessment Cervical / Trunk Assessment: Normal (increased body habitus)   Communication Communication Communication: Impaired Factors Affecting Communication: Hearing impaired   Cognition Arousal: Alert Behavior During Therapy: WFL for tasks assessed/performed Cognition: No apparent impairments                               Following commands: Intact       Cueing  General Comments   Cueing Techniques: Verbal cues      Exercises     Shoulder Instructions      Home Living Family/patient expects to be discharged to:: Private residence Living Arrangements: Spouse/significant other Available Help at Discharge: Family;Available 24 hours/day Type of Home: House Home Access: Stairs to enter [employer] of Steps: 1   Home Layout: One level     Bathroom Shower/Tub: Producer, television/film/video: Handicapped height     Home Equipment: Hand held shower head;Shower Counsellor (2 wheels);Rollator (4 wheels);Cane - quad;Cane - single point   Additional Comments: Cares for wife who has Parkinson's (provides PRN physical assistance) & daughters assist him daily      Prior Functioning/Environment Prior Level of Function : Independent/Modified Independent;Driving             Mobility Comments: cares for wife who has Parkinsons; daughters assist ADLs Comments: denies falls, independent with bathing & dressing    OT Problem List: Decreased strength;Decreased activity tolerance;Impaired balance (sitting and/or standing);Decreased knowledge of use of DME or AE;Decreased knowledge of precautions;Obesity;Pain   OT Treatment/Interventions: Self-care/ADL training;Energy conservation;DME and/or AE instruction;Therapeutic activities;Patient/family  education;Balance training      OT Goals(Current goals can be found in the care plan section)   Acute Rehab OT Goals OT Goal Formulation: With patient Time For Goal Achievement: 08/16/23 Potential to Achieve Goals: Good ADL Goals Pt Will Perform Grooming: with supervision;standing Pt Will Perform Upper Body Dressing: with set-up;sitting Pt Will Perform Lower Body Dressing: with min assist;sit to/from stand Pt Will Transfer to Toilet: with supervision;ambulating;bedside commode Pt Will Perform Toileting - Clothing Manipulation and hygiene: with min assist;sit to/from stand Additional ADL Goal #1: Pt will generalize sternal precautions in mobility and ADLs. Additional ADL Goal #2: Pt will generalize energy conservation strategies during ADLs and mobililty.   OT Frequency:  Min 2X/week    Co-evaluation PT/OT/SLP Co-Evaluation/Treatment: Yes Reason for Co-Treatment: For patient/therapist safety   OT goals addressed during session: ADL's and self-care      AM-PAC OT "6 Clicks" Daily Activity     Outcome Measure Help from another person eating meals?: None Help from another person taking care of personal grooming?: A Little Help from another person toileting, which includes using toliet, bedpan, or urinal?: Total Help from another person bathing (including washing, rinsing, drying)?: A Lot Help from another person to put on and taking off regular upper body clothing?: A Lot Help from another person to put on and taking off regular lower body clothing?: Total 6 Click Score: 13   End of Session Equipment  Utilized During Treatment: Gait belt;Rolling walker (2 wheels);Oxygen  (3L) Nurse Communication: Mobility status  Activity Tolerance: Other (comment) (limited by dizziness) Patient left: in chair;with call bell/phone within reach  OT Visit Diagnosis: Unsteadiness on feet (R26.81);Other abnormalities of gait and mobility (R26.89);Pain;Muscle weakness (generalized) (M62.81);Other  (comment) (decreased activity tolerance)                Time: 6578-4696 OT Time Calculation (min): 28 min Charges:  OT General Charges $OT Visit: 1 Visit OT Evaluation $OT Eval Moderate Complexity: 1 Mod  Avanell Leigh, OTR/L Acute Rehabilitation Services Office: [phone]   Jonette Nestle 08/02/2023, 4:02 PM

## 2023-08-02 NOTE — Progress Notes (Signed)
  Inpatient Rehab Admissions Coordinator :  Per therapy recommendations, patient was screened for CIR candidacy by Ottie Glazier RN MSN.  At this time patient appears to be a potential candidate for CIR. I will place a rehab consult per protocol for full assessment. Please call me with any questions.  Ottie Glazier RN MSN Admissions Coordinator [phone]

## 2023-08-03 DIAGNOSIS — I4819 Other persistent atrial fibrillation: Secondary | ICD-10-CM

## 2023-08-03 LAB — BASIC METABOLIC PANEL WITH GFR
Anion gap: 7 (ref 5–15)
BUN: 29 mg/dL — ABNORMAL HIGH (ref 8–23)
CO2: 25 mmol/L (ref 22–32)
Calcium: 8 mg/dL — ABNORMAL LOW (ref 8.9–10.3)
Chloride: 93 mmol/L — ABNORMAL LOW (ref 98–111)
Creatinine, Ser: 1.14 mg/dL (ref 0.61–1.24)
GFR, Estimated: >60 mL/min (ref >60)
Glucose, Bld: 98 mg/dL (ref 70–99)
Potassium: 4.9 mmol/L (ref 3.5–5.1)
Sodium: 125 mmol/L — ABNORMAL LOW (ref 135–145)

## 2023-08-03 LAB — CBC
HCT: 30.8 % — ABNORMAL LOW (ref 39.0–52.0)
Hemoglobin: 10.4 g/dL — ABNORMAL LOW (ref 13.0–17.0)
MCH: 30.1 pg (ref 26.0–34.0)
MCHC: 33.8 g/dL (ref 30.0–36.0)
MCV: 89 fL (ref 80.0–100.0)
Platelets: 247 10*3/uL (ref 150–400)
RBC: 3.46 MIL/uL — ABNORMAL LOW (ref 4.22–5.81)
RDW: 13.3 % (ref 11.5–15.5)
WBC: 9.8 10*3/uL (ref 4.0–10.5)
nRBC: 0 % (ref 0.0–0.2)

## 2023-08-03 LAB — GLUCOSE, CAPILLARY
Glucose-Capillary: 101 mg/dL — ABNORMAL HIGH (ref 70–99)
Glucose-Capillary: 118 mg/dL — ABNORMAL HIGH (ref 70–99)
Glucose-Capillary: 117 mg/dL — ABNORMAL HIGH (ref 70–99)
Glucose-Capillary: 113 mg/dL — ABNORMAL HIGH (ref 70–99)

## 2023-08-03 MED ORDER — GUAIFENESIN ER 600 MG PO TB12
600.0000 mg | ORAL_TABLET | Freq: Two times a day (BID) | ORAL | Status: DC
  Administered 2023-08-03 – 2023-08-10 (×15): 600 mg via ORAL
  Filled 2023-08-03 (×15): qty 1

## 2023-08-03 NOTE — Plan of Care (Signed)
 Problem: Education: Goal: Ability to describe self-care measures that may prevent or decrease complications (Diabetes Survival Skills Education) will improve Outcome: Progressing Goal: Individualized Educational Video(s) Outcome: Progressing   Problem: Coping: Goal: Ability to adjust to condition or change in health will improve Outcome: Progressing   Problem: Fluid Volume: Goal: Ability to maintain a balanced intake and output will improve Outcome: Progressing   Problem: Health Behavior/Discharge Planning: Goal: Ability to identify and utilize available resources and services will improve Outcome: Progressing Goal: Ability to manage health-related needs will improve Outcome: Progressing   Problem: Metabolic: Goal: Ability to maintain appropriate glucose levels will improve Outcome: Progressing   Problem: Nutritional: Goal: Maintenance of adequate nutrition will improve Outcome: Progressing Goal: Progress toward achieving an optimal weight will improve Outcome: Progressing   Problem: Skin Integrity: Goal: Risk for impaired skin integrity will decrease Outcome: Progressing   Problem: Tissue Perfusion: Goal: Adequacy of tissue perfusion will improve Outcome: Progressing   Problem: Education: Goal: Knowledge of General Education information will improve Description: Including pain rating scale, medication(s)/side effects and non-pharmacologic comfort measures Outcome: Progressing   Problem: Health Behavior/Discharge Planning: Goal: Ability to manage health-related needs will improve Outcome: Progressing   Problem: Clinical Measurements: Goal: Ability to maintain clinical measurements within normal limits will improve Outcome: Progressing Goal: Will remain free from infection Outcome: Progressing Goal: Diagnostic test results will improve Outcome: Progressing Goal: Respiratory complications will improve Outcome: Progressing Goal: Cardiovascular complication will  be avoided Outcome: Progressing   Problem: Activity: Goal: Risk for activity intolerance will decrease Outcome: Progressing   Problem: Nutrition: Goal: Adequate nutrition will be maintained Outcome: Progressing   Problem: Coping: Goal: Level of anxiety will decrease Outcome: Progressing   Problem: Elimination: Goal: Will not experience complications related to bowel motility Outcome: Progressing Goal: Will not experience complications related to urinary retention Outcome: Progressing   Problem: Pain Managment: Goal: General experience of comfort will improve and/or be controlled Outcome: Progressing   Problem: Safety: Goal: Ability to remain free from injury will improve Outcome: Progressing   Problem: Skin Integrity: Goal: Risk for impaired skin integrity will decrease Outcome: Progressing   Problem: Education: Goal: Understanding of CV disease, CV risk reduction, and recovery process will improve Outcome: Progressing Goal: Individualized Educational Video(s) Outcome: Progressing   Problem: Activity: Goal: Ability to return to baseline activity level will improve Outcome: Progressing   Problem: Cardiovascular: Goal: Ability to achieve and maintain adequate cardiovascular perfusion will improve Outcome: Progressing Goal: Vascular access site(s) Level 0-1 will be maintained Outcome: Progressing   Problem: Health Behavior/Discharge Planning: Goal: Ability to safely manage health-related needs after discharge will improve Outcome: Progressing   Problem: Education: Goal: Will demonstrate proper wound care and an understanding of methods to prevent future damage Outcome: Progressing Goal: Knowledge of disease or condition will improve Outcome: Progressing Goal: Knowledge of the prescribed therapeutic regimen will improve Outcome: Progressing Goal: Individualized Educational Video(s) Outcome: Progressing   Problem: Activity: Goal: Risk for activity  intolerance will decrease Outcome: Progressing   Problem: Cardiac: Goal: Will achieve and/or maintain hemodynamic stability Outcome: Progressing   Problem: Clinical Measurements: Goal: Postoperative complications will be avoided or minimized Outcome: Progressing   Problem: Respiratory: Goal: Respiratory status will improve Outcome: Progressing   Problem: Skin Integrity: Goal: Wound healing without signs and symptoms of infection Outcome: Progressing Goal: Risk for impaired skin integrity will decrease Outcome: Progressing   Problem: Urinary Elimination: Goal: Ability to achieve and maintain adequate renal perfusion and functioning will improve Outcome: Progressing

## 2023-08-03 NOTE — Progress Notes (Signed)
 CARDIAC REHAB PHASE I   Pt sitting in chair, feeling tired after walk to bathroom. Encouraged IS and mobility today. Attempted postop [facility] education, pt asked if I could return at a later time. Will continue to follow.   [id number] Ronny Colas, RN BSN 08/03/2023 10:04 AM

## 2023-08-03 NOTE — Progress Notes (Signed)
   08/03/23 0009  BiPAP/CPAP/SIPAP  $ Non-Invasive Ventilator  Non-Invasive Vent Subsequent  BiPAP/CPAP/SIPAP Pt Type Adult  BiPAP/CPAP/SIPAP DREAMSTATIOND  Mask Type Nasal pillows  Mask Size Large  Pressure Support 10 cmH20  PEEP 5 cmH20  Flow Rate 3 lpm  Patient Home Machine No  Safety Check Completed by RT for Home Unit Yes, no issues noted  Patient Home Mask Yes  Patient Home Tubing Yes  Device Plugged into RED Power Outlet Yes  BiPAP/CPAP /SiPAP Vitals  Resp 20  Bilateral Breath Sounds Clear;Diminished

## 2023-08-03 NOTE — TOC Progression Note (Addendum)
 Transition of Care (TOC) - Progression Note    Patient Details  Name: [name]. MRN: [id number] Date of Birth: 11-18-1950  Transition of Care [facility]) CM/SW Contact  Jeani Mill, RN Phone Number: 08/03/2023, 8:54 AM  Clinical Narrative:     CIR following.  Dr. Mat Solian at [facility] follows patient. Dalbert Dubois, CSW  [phone] ext [phone]   Expected Discharge Plan: Home w Home Health Services Barriers to Discharge: Continued Medical Work up  Expected Discharge Plan and Services In-house Referral: NA Discharge Planning Services: CM Consult Post Acute Care Choice: Home Health Living arrangements for the past 2 months: Single Family Home                                       Social Determinants of Health (SDOH) Interventions SDOH Screenings   Food Insecurity: No Food Insecurity (07/24/2023)  Housing: Low Risk  (07/24/2023)  Transportation Needs: No Transportation Needs (07/24/2023)  Utilities: Not At Risk (07/24/2023)  Social Connections: Moderately Integrated (07/24/2023)  Tobacco Use: Medium Risk (07/30/2023)    Readmission Risk Interventions     No data to display

## 2023-08-03 NOTE — Progress Notes (Signed)
 Inpatient Rehab Admissions Coordinator:    I met with pt. To discuss potential CIR admit. He is interested but not sure family can care for him at home afterwards, as Pt. Was caring for his wife with advanced parkinson's PTA. I will reach out to daughters.   Wandalee Gust, MS, CCC-SLP Rehab Admissions Coordinator  [phone] (celll) [phone] (office)

## 2023-08-03 NOTE — Progress Notes (Addendum)
 [address]       [place] [address]             [phone]      4 Days Post-Op Procedure(s) (LRB): CORONARY ARTERY BYPASS GRAFTING (CABG) TIMES THREE USING THE LEFT INTERNAL MAMMARY ARTERY AND ENDOSCOPICALLY HARVESTED RIGHT GREATER SAPHENOUS VEIN (N/A) ECHOCARDIOGRAM, TRANSESOPHAGEAL, INTRAOPERATIVE (N/A) CLIPPING, LEFT ATRIAL APPENDAGE (Left) Subjective: Patient reports he had a small bowel movement yesterday and his nausea has resolved. He states his pain is also getting better. He does admit it is hard to cough up sputum.   Objective: Vital signs in last 24 hours: Temp:  [98 F (36.7 C)-99 F (37.2 C)] 98 F (36.7 C) (06/06 0312) Pulse Rate:  [86-129] 86 (06/06 0312) Cardiac Rhythm: Sinus tachycardia;Bundle branch block (06/05 1940) Resp:  [16-22] 18 (06/06 0312) BP: (95-129)/(65-85) 129/83 (06/06 0312) SpO2:  [94 %-98 %] 97 % (06/06 0312) Weight:  [129.3 kg] 129.3 kg (06/06 0525)  Hemodynamic parameters for last 24 hours:    Intake/Output from previous day: 06/05 0701 - 06/06 0700 In: -  Out: 625 [Urine:625] Intake/Output this shift: No intake/output data recorded.  General appearance: alert, cooperative, and no distress Neurologic: intact Heart: irregularly irregular rhythm Lungs: diminished bibasilar breath sounds Abdomen: soft, non-tender; bowel sounds normal; no masses,  no organomegaly and protuberant abdomen Extremities: edema 2+ pitting edema Wound: Clean and dry without sign of infection  Lab Results: Recent Labs    08/02/23 0254 08/03/23 0241  WBC 9.4 9.8  HGB 10.6* 10.4*  HCT 30.6* 30.8*  PLT 182 247   BMET:  Recent Labs    08/02/23 0254 08/03/23 0241  NA 126* 125*  K 5.0 4.9  CL 94* 93*  CO2 27 25  GLUCOSE 142* 98  BUN 20 29*  CREATININE 0.82 1.14  CALCIUM  8.3* 8.0*    PT/INR: No results for input(s): "LABPROT", "INR" in the last 72 hours. ABG    Component Value Date/Time   PHART 7.330 (L) 07/31/2023  0134   HCO3 24.8 07/31/2023 0134   TCO2 26 07/31/2023 0134   ACIDBASEDEF 1.0 07/31/2023 0134   O2SAT 97 07/31/2023 0134   CBG (last 3)  Recent Labs    08/02/23 1601 08/02/23 2145 08/03/23 0640  GLUCAP 157* 140* 101*    Assessment/Plan: S/P Procedure(s) (LRB): CORONARY ARTERY BYPASS GRAFTING (CABG) TIMES THREE USING THE LEFT INTERNAL MAMMARY ARTERY AND ENDOSCOPICALLY HARVESTED RIGHT GREATER SAPHENOUS VEIN (N/A) ECHOCARDIOGRAM, TRANSESOPHAGEAL, INTRAOPERATIVE (N/A) CLIPPING, LEFT ATRIAL APPENDAGE (Left)  CV: Hx of PAF on Eliquis  prior to admission. Afib with controlled rate this AM, HR 90s but looks like HR was elevated up into the 130s. May need to start PO Amiodarone if HR becomes elevated again. Eliquis  has been restarted and on ASA 81mg . On Coreg  12.5mg  BID, Entresto has been held due to soft BP. Hopefully can restart prior to d/c. SBP 95-120s.   Pulm: Saturating well on 4L [place]. Having difficulty expectorating sputum. Will add mucinex . CXR with bibasilar atelectasis and left pleural effusion. Continue diuresis. Encourage IS and ambulation. Will add flutter valve. Wean oxygen  as able for O2>90%  GI: +BM yesterday, nausea resolved. Tolerating a diet.   Endo: T2DM, preop A1C 6.2. CBGs controlled on Semglee  30U BID and SSI.   Renal: Cr 1.14, slightly increased likely due to diuresis but still overall stable. UO 625cc/24hrs recorded. Weight has remained the same for the last 4 days even with IV Lasix  BID yesterday. May need  to try Demadex or Metolazone? Will add TED hose. Mild hyponatremia like due to hypervolemia, continue diuresis.   Expected postop ABLA: H/H 10.4/30.8, stable. Not clinically significant at this time.   DVT Prophylaxis: On Eliquis   Deconditioning: Possible CIR candidate, CIR is following. Work with PT/OT  Dispo: Work on diuresis and weaning off oxygen , as well as working with PT/OT.    LOS: 10 days    Randa Burton, PA-C 08/03/2023  Patient seen and  examined, agree with above Looks much better today and was able walk in the hallway with assistance Plan is for CIR next week  Landon Pinion C. Luna Salinas, MD [facility] [phone]

## 2023-08-04 ENCOUNTER — Inpatient Hospital Stay (HOSPITAL_COMMUNITY)

## 2023-08-04 LAB — GLUCOSE, CAPILLARY
Glucose-Capillary: 53 mg/dL — ABNORMAL LOW (ref 70–99)
Glucose-Capillary: 76 mg/dL (ref 70–99)
Glucose-Capillary: 82 mg/dL (ref 70–99)
Glucose-Capillary: 59 mg/dL — ABNORMAL LOW (ref 70–99)
Glucose-Capillary: 94 mg/dL (ref 70–99)

## 2023-08-04 LAB — BASIC METABOLIC PANEL WITH GFR
Anion gap: 8 (ref 5–15)
BUN: 27 mg/dL — ABNORMAL HIGH (ref 8–23)
CO2: 27 mmol/L (ref 22–32)
Calcium: 7.9 mg/dL — ABNORMAL LOW (ref 8.9–10.3)
Chloride: 92 mmol/L — ABNORMAL LOW (ref 98–111)
Creatinine, Ser: 0.86 mg/dL (ref 0.61–1.24)
GFR, Estimated: >60 mL/min (ref >60)
Glucose, Bld: 55 mg/dL — ABNORMAL LOW (ref 70–99)
Potassium: 4.3 mmol/L (ref 3.5–5.1)
Sodium: 127 mmol/L — ABNORMAL LOW (ref 135–145)

## 2023-08-04 MED ORDER — INSULIN GLARGINE-YFGN 100 UNIT/ML ~~LOC~~ SOPN
24.0000 [IU] | PEN_INJECTOR | Freq: Every day | SUBCUTANEOUS | Status: DC
  Filled 2023-08-04: qty 3

## 2023-08-04 MED ORDER — METOLAZONE 2.5 MG PO TABS
5.0000 mg | ORAL_TABLET | Freq: Every day | ORAL | Status: DC
  Administered 2023-08-04 – 2023-08-05 (×2): 5 mg via ORAL
  Filled 2023-08-04 (×2): qty 2

## 2023-08-04 MED ORDER — INSULIN GLARGINE-YFGN 100 UNIT/ML ~~LOC~~ SOLN
24.0000 [IU] | Freq: Every day | SUBCUTANEOUS | Status: DC
  Filled 2023-08-04: qty 0.24

## 2023-08-04 MED ORDER — INSULIN GLARGINE-YFGN 100 UNIT/ML ~~LOC~~ SOLN: 30.0000 [IU] | Freq: Every morning | SUBCUTANEOUS | Status: DC

## 2023-08-04 MED ORDER — INSULIN GLARGINE-YFGN 100 UNIT/ML ~~LOC~~ SOLN
20.0000 [IU] | Freq: Two times a day (BID) | SUBCUTANEOUS | Status: DC
  Administered 2023-08-05 – 2023-08-10 (×10): 20 [IU] via SUBCUTANEOUS
  Filled 2023-08-04 (×13): qty 0.2

## 2023-08-04 NOTE — Progress Notes (Signed)
 Patient called out at about 4 a.m. as his glucose meter was reading 55. Bedside glucose 53 with our glucometer. 8 oz of apple juice provided. Will recheck sugar in 15 minutes.

## 2023-08-04 NOTE — Progress Notes (Signed)
 [address]       [place] [address]             [phone]      5 Days Post-Op Procedure(s) (LRB): CORONARY ARTERY BYPASS GRAFTING (CABG) TIMES THREE USING THE LEFT INTERNAL MAMMARY ARTERY AND ENDOSCOPICALLY HARVESTED RIGHT GREATER SAPHENOUS VEIN (N/A) ECHOCARDIOGRAM, TRANSESOPHAGEAL, INTRAOPERATIVE (N/A) CLIPPING, LEFT ATRIAL APPENDAGE (Left) Subjective: Resting in bed.  Said he feels he is progressing a little each day.  Pain controlled.  No new issues.  Objective: Vital signs in last 24 hours: Temp:  [97.6 F (36.4 C)-98 F (36.7 C)] 97.6 F (36.4 C) (06/07 0709) Pulse Rate:  [71-99] 91 (06/07 0518) Cardiac Rhythm: Normal sinus rhythm;Bundle branch block (06/07 0705) Resp:  [17-22] 18 (06/07 0709) BP: (112-144)/(67-83) 144/83 (06/07 0709) SpO2:  [87 %-100 %] 100 % (06/07 0709)    Intake/Output from previous day: 06/06 0701 - 06/07 0700 In: 360 [P.O.:360] Out: 2200 [Urine:2200] Intake/Output this shift: No intake/output data recorded.  General appearance: alert, cooperative, and no distress Neurologic: intact Heart: Sinus rhythm with PAF Lungs: diminished bibasilar breath sounds Abdomen: soft, non-tender; bowel sounds normal Extremities: edema 2+ pitting edema Wound: Clean and dry without sign of infection  Lab Results: Recent Labs    08/02/23 0254 08/03/23 0241  WBC 9.4 9.8  HGB 10.6* 10.4*  HCT 30.6* 30.8*  PLT 182 247   BMET:  Recent Labs    08/03/23 0241 08/04/23 0213  NA 125* 127*  K 4.9 4.3  CL 93* 92*  CO2 25 27  GLUCOSE 98 55*  BUN 29* 27*  CREATININE 1.14 0.86  CALCIUM  8.0* 7.9*    PT/INR: No results for input(s): "LABPROT", "INR" in the last 72 hours. ABG    Component Value Date/Time   PHART 7.330 (L) 07/31/2023 0134   HCO3 24.8 07/31/2023 0134   TCO2 26 07/31/2023 0134   ACIDBASEDEF 1.0 07/31/2023 0134   O2SAT 97 07/31/2023 0134   CBG (last 3)  Recent Labs    08/03/23 2105 08/04/23 0359  08/04/23 0619  GLUCAP 113* 53* 76   CLINICAL DATA:  73 year old male status post CABG postoperative day 5.   EXAM: PORTABLE CHEST 1 VIEW   COMPARISON:  08/02/2023 chest radiographs and earlier.   FINDINGS: Portable AP semi upright view at 0525 hours. Stable cardiomegaly and mediastinal contours. Postoperative changes to the mediastinum. Left lung base ventilation appears mildly improved, small veiling opacity may persist. No pneumothorax, pulmonary edema, consolidation. Visualized tracheal air column is within normal limits. Stable visualized osseous structures.   IMPRESSION: Small left pleural effusion may have regressed. Stable cardiomegaly and postoperative mediastinum. No new cardiopulmonary abnormality.     Electronically Signed   By: Marlise Simpers M.D.   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  Preop EF 45% with LVH.  BP stable.  On aspirin , carvedilol .  Does not tolerate statins.  He was taking Entresto prior to admission but this has been on hold due to soft blood pressure.  Will attempt to restart before discharge.  -History of paroxysmal atrial fibrillation: Mostly sinus rhythm on carvedilol .  Eliquis  has been resumed.  -Pulm: Chest x-ray this morning  shows improvement in the small left pleural effusion.  Lung fields are otherwise clear.  Working on pulmonary hygiene and oxygen  wean.  -GI: Abdomen benign.  Tolerating diet.  Small bowel movement yesterday  -Endo: T2DM, preop A1C 6.2.  Was hypoglycemic this morning.  Will reduce the at bedtime Semglee   -Renal: Cr trending down, at  baseline.  Wt is 9kg above pre-op if accurate with no real change in past few days. Will add zaroxolyn to the daily Lasix . Monitor renal function and K+ closely.   -HEME: Expected postop ABLA: H/H 10.4/30.8, stable. Not clinically significant at this time.   -DVT Prophylaxis: On Eliquis   -Deconditioning: Possible CIR candidate, CIR is following. Work with PT/OT     LOS: 11 days    Leata Providence, PA-C 08/04/2023   Agree Restarting eliquis  PT/OT  Hilarie Lovely

## 2023-08-04 NOTE — Plan of Care (Signed)
  Problem: Coping: Goal: Ability to adjust to condition or change in health will improve Outcome: Progressing   Problem: Fluid Volume: Goal: Ability to maintain a balanced intake and output will improve Outcome: Progressing   Problem: Metabolic: Goal: Ability to maintain appropriate glucose levels will improve Outcome: Progressing   Problem: Skin Integrity: Goal: Risk for impaired skin integrity will decrease Outcome: Progressing   Problem: Pain Managment: Goal: General experience of comfort will improve and/or be controlled Outcome: Progressing

## 2023-08-04 NOTE — Progress Notes (Signed)
   08/04/23 2315  BiPAP/CPAP/SIPAP  BiPAP/CPAP/SIPAP Pt Type Adult  BiPAP/CPAP/SIPAP DREAMSTATIOND  Mask Type Nasal pillows  Mask Size Large  IPAP 8 cmH20  Pressure Support 10 cmH20  PEEP 5 cmH20  Flow Rate 3 lpm  Patient Home Machine No  Safety Check Completed by RT for Home Unit Yes, no issues noted  Patient Home Mask Yes  Patient Home Tubing Yes  Device Plugged into RED Power Outlet Yes  BiPAP/CPAP /SiPAP Vitals  Pulse Rate (!) 102  SpO2 98 %  Bilateral Breath Sounds Clear;Diminished  MEWS Score/Color  MEWS Score 1  MEWS Score Color Marrie Sizer

## 2023-08-04 NOTE — Progress Notes (Signed)
   08/04/23 0012  BiPAP/CPAP/SIPAP  $ Non-Invasive Ventilator  Non-Invasive Vent Subsequent  BiPAP/CPAP/SIPAP Pt Type Adult  BiPAP/CPAP/SIPAP DREAMSTATIOND  Mask Type Nasal pillows  Mask Size Large  Pressure Support 10 cmH20  PEEP 5 cmH20  Flow Rate 3 lpm  Patient Home Machine No  Safety Check Completed by RT for Home Unit Yes, no issues noted  Patient Home Mask Yes  Patient Home Tubing Yes  Device Plugged into RED Power Outlet Yes  BiPAP/CPAP /SiPAP Vitals  Pulse Rate 99  Resp 18  SpO2 94 %  Bilateral Breath Sounds Clear;Diminished  MEWS Score/Color  MEWS Score 0  MEWS Score Color Marrie Sizer

## 2023-08-04 NOTE — Plan of Care (Signed)

## 2023-08-05 LAB — GLUCOSE, CAPILLARY
Glucose-Capillary: 101 mg/dL — ABNORMAL HIGH (ref 70–99)
Glucose-Capillary: 134 mg/dL — ABNORMAL HIGH (ref 70–99)
Glucose-Capillary: 165 mg/dL — ABNORMAL HIGH (ref 70–99)
Glucose-Capillary: 206 mg/dL — ABNORMAL HIGH (ref 70–99)

## 2023-08-05 LAB — BASIC METABOLIC PANEL WITH GFR
Anion gap: 11 (ref 5–15)
BUN: 21 mg/dL (ref 8–23)
CO2: 32 mmol/L (ref 22–32)
Calcium: 8.6 mg/dL — ABNORMAL LOW (ref 8.9–10.3)
Chloride: 85 mmol/L — ABNORMAL LOW (ref 98–111)
Creatinine, Ser: 0.75 mg/dL (ref 0.61–1.24)
GFR, Estimated: >60 mL/min (ref >60)
Glucose, Bld: 49 mg/dL — ABNORMAL LOW (ref 70–99)
Potassium: 3.5 mmol/L (ref 3.5–5.1)
Sodium: 128 mmol/L — ABNORMAL LOW (ref 135–145)

## 2023-08-05 MED ORDER — POTASSIUM CHLORIDE ER 10 MEQ PO TBCR
40.0000 meq | EXTENDED_RELEASE_TABLET | Freq: Two times a day (BID) | ORAL | Status: AC
  Administered 2023-08-05 – 2023-08-06 (×2): 40 meq via ORAL
  Filled 2023-08-05 (×2): qty 4

## 2023-08-05 MED ORDER — POTASSIUM CHLORIDE CRYS ER 20 MEQ PO TBCR
20.0000 meq | EXTENDED_RELEASE_TABLET | Freq: Three times a day (TID) | ORAL | Status: DC
  Administered 2023-08-06 – 2023-08-10 (×13): 20 meq via ORAL
  Filled 2023-08-05 (×13): qty 1

## 2023-08-05 MED ORDER — ACETAMINOPHEN 325 MG PO TABS
650.0000 mg | ORAL_TABLET | Freq: Four times a day (QID) | ORAL | Status: DC | PRN
  Administered 2023-08-05 – 2023-08-07 (×5): 650 mg via ORAL
  Filled 2023-08-05 (×6): qty 2

## 2023-08-05 NOTE — Plan of Care (Signed)
   Problem: Education: Goal: Ability to describe self-care measures that may prevent or decrease complications (Diabetes Survival Skills Education) will improve Outcome: Progressing   Problem: Coping: Goal: Ability to adjust to condition or change in health will improve Outcome: Progressing   Problem: Fluid Volume: Goal: Ability to maintain a balanced intake and output will improve Outcome: Progressing

## 2023-08-05 NOTE — Progress Notes (Addendum)
 [address]       [place] [address]             [phone]      6 Days Post-Op Procedure(s) (LRB): CORONARY ARTERY BYPASS GRAFTING (CABG) TIMES THREE USING THE LEFT INTERNAL MAMMARY ARTERY AND ENDOSCOPICALLY HARVESTED RIGHT GREATER SAPHENOUS VEIN (N/A) ECHOCARDIOGRAM, TRANSESOPHAGEAL, INTRAOPERATIVE (N/A) CLIPPING, LEFT ATRIAL APPENDAGE (Left) Subjective: Just finished breakfast.  Up in the bedside chair.  No new concerns.  Continues to require a lot of help with mobility.  Small BM yesterday.  Said he does not feel like he needs a laxative.   Objective: Vital signs in last 24 hours: Temp:  [97.7 F (36.5 C)-98.8 F (37.1 C)] 98.3 F (36.8 C) (06/08 0705) Pulse Rate:  [76-102] 97 (06/08 0705) Cardiac Rhythm: Atrial flutter;Bundle branch block (06/08 0902) Resp:  [19-24] 22 (06/08 0500) BP: (104-166)/(63-99) 104/63 (06/08 0705) SpO2:  [93 %-100 %] 95 % (06/08 0705) Weight:  [128.2 kg] 128.2 kg (06/08 0500)    Intake/Output from previous day: 06/07 0701 - 06/08 0700 In: 360 [P.O.:360] Out: 2150 [Urine:2150] Intake/Output this shift: Total I/O In: 240 [P.O.:240] Out: -   General appearance: alert, cooperative, and no distress Neurologic: intact Heart: Currently in atrial fibrillation with controlled ventricular rate Lungs: diminished bibasilar breath sounds Abdomen: Obese, soft, non-tender; bowel sounds normal Extremities: edema 2+ pitting edema bilaterally Wound: Clean and dry without sign of infection  Lab Results: Recent Labs    08/03/23 0241  WBC 9.8  HGB 10.4*  HCT 30.8*  PLT 247   BMET:  Recent Labs    08/04/23 0213 08/05/23 0218  NA 127* 128*  K 4.3 3.5  CL 92* 85*  CO2 27 32  GLUCOSE 55* 49*  BUN 27* 21  CREATININE 0.86 0.75  CALCIUM  7.9* 8.6*    PT/INR: No results for input(s): "LABPROT", "INR" in the last 72 hours. ABG    Component Value Date/Time   PHART 7.330 (L) 07/31/2023 0134   HCO3 24.8 07/31/2023 0134    TCO2 26 07/31/2023 0134   ACIDBASEDEF 1.0 07/31/2023 0134   O2SAT 97 07/31/2023 0134   CBG (last 3)  Recent Labs    08/04/23 1617 08/04/23 2115 08/05/23 0615  GLUCAP 59* 94 101*   CLINICAL DATA:  73 year old male status post CABG postoperative day 5.   EXAM: PORTABLE CHEST 1 VIEW   COMPARISON:  08/02/2023 chest radiographs and earlier.   FINDINGS: Portable AP semi upright view at 0525 hours. Stable cardiomegaly and mediastinal contours. Postoperative changes to the mediastinum. Left lung base ventilation appears mildly improved, small veiling opacity may persist. No pneumothorax, pulmonary edema, consolidation. Visualized tracheal air column is within normal limits. Stable visualized osseous structures.   IMPRESSION: Small left pleural effusion may have regressed. Stable cardiomegaly and postoperative mediastinum. No new cardiopulmonary abnormality.     Electronically Signed   By: Marlise Simpers M.D.   On: 08/04/2023 08:36  Assessment/Plan: S/P Procedure(s) (LRB): CORONARY ARTERY BYPASS GRAFTING (CABG) TIMES THREE USING THE LEFT INTERNAL MAMMARY ARTERY AND ENDOSCOPICALLY HARVESTED RIGHT GREATER SAPHENOUS VEIN (N/A) ECHOCARDIOGRAM, TRANSESOPHAGEAL, INTRAOPERATIVE (N/A) CLIPPING, LEFT ATRIAL APPENDAGE (Left)  -Postop day 6 CABG x 3 for multivessel coronary artery disease presenting with acute non-ST elevation myocardial infarction.  Preop EF 45% with LVH.  BP stable.  On aspirin , carvedilol .  Does not tolerate statins.  He was taking Entresto prior to admission but this has been on hold due to soft blood pressure.  Plan to restart before discharge but continue holding for now.  -History of paroxysmal atrial fibrillation: Currently in rate-controlled A-fib.  On carvedilol  and Eliquis   -Pulm: Chest x-ray 6/7 shows improvement in the small left pleural effusion.  Lung fields are otherwise clear.  Working on pulmonary hygiene and oxygen  wean.  Using BiPAP at night  -GI: Abdomen  benign.  Tolerating diet.  Small bowel movement yesterday  -Endo: T2DM, preop A1C 6.2.  Was hypoglycemic this morning.  Will reduce the at bedtime Semglee   -Renal: Cr trending down, at baseline.  Weight down 1 kg from yesterday but still about 8 kg above preop.  He will need several more days of diuresis.  Continue Lasix  40 mg IV twice daily and Zaroxolyn 5 mg daily.  Supplement K+  -HEME: Expected postop ABLA: H/H 10.4/30.8, stable. Not clinically significant at this time.   -DVT Prophylaxis: On Eliquis   -Deconditioning: Possible CIR candidate, CIR is following. Work with PT/OT    LOS: 12 days    Myron G. Roddenberry, PA-C 08/05/2023  Agree  Doing well Continue PT/OT  Mya Suell Ala Alice

## 2023-08-06 LAB — CBC
HCT: 33.2 % — ABNORMAL LOW (ref 39.0–52.0)
Hemoglobin: 11.3 g/dL — ABNORMAL LOW (ref 13.0–17.0)
MCH: 29.3 pg (ref 26.0–34.0)
MCHC: 34 g/dL (ref 30.0–36.0)
MCV: 86 fL (ref 80.0–100.0)
Platelets: 457 10*3/uL — ABNORMAL HIGH (ref 150–400)
RBC: 3.86 MIL/uL — ABNORMAL LOW (ref 4.22–5.81)
RDW: 13.2 % (ref 11.5–15.5)
WBC: 12.8 10*3/uL — ABNORMAL HIGH (ref 4.0–10.5)
nRBC: 0 % (ref 0.0–0.2)

## 2023-08-06 LAB — GLUCOSE, CAPILLARY
Glucose-Capillary: 106 mg/dL — ABNORMAL HIGH (ref 70–99)
Glucose-Capillary: 182 mg/dL — ABNORMAL HIGH (ref 70–99)
Glucose-Capillary: 218 mg/dL — ABNORMAL HIGH (ref 70–99)
Glucose-Capillary: 161 mg/dL — ABNORMAL HIGH (ref 70–99)

## 2023-08-06 LAB — BASIC METABOLIC PANEL WITH GFR
Anion gap: 12 (ref 5–15)
BUN: 24 mg/dL — ABNORMAL HIGH (ref 8–23)
CO2: 33 mmol/L — ABNORMAL HIGH (ref 22–32)
Calcium: 8.8 mg/dL — ABNORMAL LOW (ref 8.9–10.3)
Chloride: 82 mmol/L — ABNORMAL LOW (ref 98–111)
Creatinine, Ser: 0.82 mg/dL (ref 0.61–1.24)
GFR, Estimated: >60 mL/min (ref >60)
Glucose, Bld: 117 mg/dL — ABNORMAL HIGH (ref 70–99)
Potassium: 3.7 mmol/L (ref 3.5–5.1)
Sodium: 127 mmol/L — ABNORMAL LOW (ref 135–145)

## 2023-08-06 MED ORDER — LACTULOSE 10 GM/15ML PO SOLN
20.0000 g | Freq: Every day | ORAL | Status: DC | PRN
  Administered 2023-08-08: 20 g via ORAL
  Filled 2023-08-06: qty 30

## 2023-08-06 MED ORDER — POLYETHYLENE GLYCOL 3350 17 G PO PACK: 17.0000 g | PACK | Freq: Every day | ORAL | Status: DC

## 2023-08-06 MED ORDER — POLYETHYLENE GLYCOL 3350 17 G PO PACK
17.0000 g | PACK | Freq: Every day | ORAL | Status: DC
  Administered 2023-08-06: 17 g via ORAL
  Filled 2023-08-06: qty 1

## 2023-08-06 NOTE — PMR Pre-admission (Shared)
 PMR Admission Coordinator Pre-Admission Assessment  Patient: [name]. is an 73 y.o., male MRN: [id number] DOB: 10/04/1950 Height: 6\' 2"  (188 cm) Weight: 122.6 kg  Insurance Information HMO:     PPO:      PCP:      IPA:      80/20:      OTHER:  PRIMARY: [facility]      Policy#: [id number]       Subscriber: *** CM Name: ***      Phone#: ***     Fax#: *** Pre-Cert#: ***      Employer: *** Benefits:  Phone #: ***     Name: *** Venson Ginger. Date: ***     Deduct: ***      Out of Pocket Max: ***      Life Max: *** CIR: ***      SNF: *** Outpatient: ***     Co-Pay: *** Home Health: ***      Co-Pay: *** DME: ***     Co-Pay: *** Providers: *** SECONDARY: [employer] Medicare      Policy#: [id number]      Phone#:   Financial Counselor:       Phone#:   The "Data Collection Information Summary" for patients in Inpatient Rehabilitation Facilities with attached "Privacy Act Statement-Health Care Records" was provided and verbally reviewed with: Patient  Emergency Contact Information Contact Information     Name Relation Home Work Mobile   [place] Daughter [phone]  [phone]      Other Contacts     Name Relation Home Work Mobile   [place] Daughter [phone]     Lyon,Katelyn Other   [phone]       Current Medical History  Patient Admitting Diagnosis: NSTEMI s/p CABG History of Present Illness: [name] is a 73yo M with PMH HTN, T2DM, HLD, PVD, dyspepsia, OSA, mild intermittent asthma, h/o subdural hematoma s/p surgery, marijuana use who presentted to [facility] via EMS on 07/24/23  from [employer] for shortness of breath. Pt. Was in Acute hypoxic respiratory failure - with evidence of volume overload on imaging and clinical exam with bilateral crackles and 1+ pitting edema on my exam and elevated BNP. No infectious symptoms, leukocytosis or focal findings to concern for PNA. CXR showed pulmonary edema. Pt. Found to be in heart failure and  to  have NSTEMI, now s/p L/R HC which showed significant LAD stenosis. He underwent CABG on 07/31/23. He was seen by PT/OT and they recommended CIR to assist return to PLOF.     Patient's medical record from [facility] has been reviewed by the rehabilitation admission coordinator and physician.  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 Yes  Family History   family history includes Alzheimer's disease in his mother; Cancer in his paternal grandfather; Diabetes in his brother; Heart attack (age of onset: 85) in his maternal grandfather; Hypertension in his mother; Stroke in his father.  Current Medications  Current Facility-Administered Medications:    acetaminophen  (TYLENOL ) tablet 650 mg, 650 mg, Oral, Q6H PRN, Lightfoot, Harrell O, MD, 650 mg at 08/06/23 0617    albuterol  (PROVENTIL ) (2.5 MG/3ML) 0.083% nebulizer solution 2.5 mg, 2.5 mg, Inhalation, Q6H PRN, Hendrickson, Steven C, MD   ALPRAZolam  (XANAX ) tablet 0.25-0.5 mg, 0.25-0.5 mg, Oral, Q6H PRN, Hendrickson, Steven C, MD, 0.5 mg at 08/05/23 2113   alum & mag hydroxide-simeth (MAALOX/MYLANTA) 200-200-20 MG/5ML suspension 15 mL, 15 mL, Oral, Q6H PRN, Hendrickson, Steven C, MD, 15 mL at 08/05/23 2309   apixaban  (ELIQUIS ) tablet 5 mg, 5 mg, Oral, BID, Hendrickson, Steven C, MD, 5 mg at 08/05/23 2113   aspirin  EC tablet 81 mg, 81 mg, Oral, Daily, Zelphia Higashi, MD, 81 mg at 08/05/23 1610   bisacodyl  (DULCOLAX) EC tablet 10 mg, 10 mg, Oral, Daily, 10 mg at 08/05/23 0840 **OR** bisacodyl  (DULCOLAX) suppository 10 mg, 10 mg, Rectal, Daily, Zelphia Higashi, MD   carvedilol  (COREG ) tablet 12.5 mg, 12.5 mg, Oral, BID, Hendrickson, Steven C, MD, 12.5 mg at 08/05/23 2113   cromolyn  (OPTICROM ) 4 % ophthalmic solution 1 drop, 1 drop, Both Eyes, QID PRN, Zelphia Higashi, MD   docusate sodium  (COLACE) capsule 200 mg, 200 mg, Oral, Daily, Zelphia Higashi, MD, 200 mg at 08/05/23 9604   fluticasone  (FLONASE ) 50 MCG/ACT nasal spray 2 spray, 2 spray, Each Nare, Daily, Zelphia Higashi, MD, 2 spray at 08/05/23 0841   folic acid  (FOLVITE ) tablet 1 mg, 1 mg, Oral, Daily, Zelphia Higashi, MD, 1 mg at 08/05/23 0840   furosemide  (LASIX ) injection 40 mg, 40 mg, Intravenous, Q12H, Barrett, Erin R, PA-C, 40 mg at 08/05/23 2113   guaiFENesin  (MUCINEX ) 12 hr tablet 600 mg, 600 mg, Oral, BID, Stehler, Barba Bonnet, PA-C, 600 mg at 08/05/23 2113   hydrOXYzine  (ATARAX ) tablet 25 mg, 25 mg, Oral, BID PRN, Hendrickson, Steven C, MD, 25 mg at 08/02/23 0252   insulin  aspart (novoLOG ) injection 0-20 Units, 0-20 Units, Subcutaneous, TID WC, Hendrickson, Steven C, MD, 4 Units at 08/05/23 1737   insulin  aspart (novoLOG ) injection 0-5 Units, 0-5 Units, Subcutaneous, QHS, Hendrickson, Steven C, MD, 2 Units at  08/05/23 2111   insulin  glargine-yfgn (SEMGLEE ) injection 20 Units, 20 Units, Subcutaneous, BID, Roddenberry, Myron G, PA-C, 20 Units at 08/05/23 2112   lactulose (CHRONULAC) 10 GM/15ML solution 20 g, 20 g, Oral, Daily PRN, Stehler, Barba Bonnet, PA-C   metoprolol  tartrate (LOPRESSOR ) injection 2.5-5 mg, 2.5-5 mg, Intravenous, Q2H PRN, Hendrickson, Steven C, MD   multivitamin with minerals tablet 1 tablet, 1 tablet, Oral, Daily, Zelphia Higashi, MD, 1 tablet at 08/05/23 0841   olopatadine  (PATANOL) 0.1 % ophthalmic solution 1 drop, 1  drop, Both Eyes, BID, Zelphia Higashi, MD, 1 drop at 08/05/23 2114   ondansetron  (ZOFRAN ) injection 4 mg, 4 mg, Intravenous, Q6H PRN, Zelphia Higashi, MD, 4 mg at 07/31/23 0344   oxyCODONE  (Oxy IR/ROXICODONE ) immediate release tablet 5-10 mg, 5-10 mg, Oral, Q3H PRN, Hendrickson, Steven C, MD, 9 mg at 08/04/23 2347   pantoprazole  (PROTONIX ) EC tablet 40 mg, 40 mg, Oral, Daily, Zelphia Higashi, MD, 40 mg at 08/05/23 0840   polyethylene glycol (MIRALAX  / GLYCOLAX ) packet 17 g, 17 g, Oral, Daily, Stehler, Barba Bonnet, PA-C   potassium chloride  (KLOR-CON ) CR tablet 40 mEq, 40 mEq, Oral, BID, Roddenberry, Myron G, PA-C, 40 mEq at 08/05/23 2113   potassium chloride  SA (KLOR-CON  M) CR tablet 20 mEq, 20 mEq, Oral, TID, Roddenberry, Myron G, PA-C   sodium chloride  flush (NS) 0.9 % injection 3 mL, 3 mL, Intravenous, Q12H, Zelphia Higashi, MD, 3 mL at 08/05/23 2117   sodium chloride  flush (NS) 0.9 % injection 3 mL, 3 mL, Intravenous, PRN, Zelphia Higashi, MD   terazosin  (HYTRIN ) capsule 2 mg, 2 mg, Oral, QHS, Zelphia Higashi, MD, 2 mg at 08/05/23 2113   thiamine  (VITAMIN B1) tablet 100 mg, 100 mg, Oral, Daily, 100 mg at 08/05/23 0840 **OR** thiamine  (VITAMIN B1) injection 100 mg, 100 mg, Intravenous, Daily, Zelphia Higashi, MD   Tiotropium Bromide  Monohydrate AERS 2 puff, 2 puff, Inhalation, Daily, Zelphia Higashi, MD, 2 puff at  08/05/23 6213   traMADol  (ULTRAM ) tablet 50 mg, 50 mg, Oral, Q6H PRN, Barrett, Erin R, PA-C, 50 mg at 08/06/23 0227   traZODone  (DESYREL ) tablet 100 mg, 100 mg, Oral, QHS, Hendrickson, Steven C, MD, 100 mg at 08/05/23 2113  Patients Current Diet:  Diet Order             Diet heart healthy/carb modified Room service appropriate? Yes with Assist; Fluid consistency: Thin  Diet effective now                   Precautions / Restrictions Precautions Precautions: Sternal, Fall Precaution Booklet Issued: Yes (comment) Precaution/Restrictions Comments: pt aware of "move in the tube", cues to generalize Restrictions Weight Bearing Restrictions Per Provider Order: No RUE Weight Bearing Per Provider Order: Weight bearing as tolerated LUE Weight Bearing Per Provider Order: Weight bearing as tolerated   Has the patient had 2 or more falls or a fall with injury in the past year? No  Prior Activity Level Community (5-7x/wk): Pt active in community PTA  Prior Functional Level Self Care: Did the patient need help bathing, dressing, using the toilet or eating? Independent  Indoor Mobility: Did the patient need assistance with walking from room to room (with or without device)? Independent  Stairs: Did the patient need assistance with internal or external stairs (with or without device)? Independent  Functional Cognition: Did the patient need help planning regular tasks such as shopping or remembering to take medications? Independent  Patient Information Are you of Hispanic, Latino/a,or Spanish origin?: A. No, not of Hispanic, Latino/a, or Spanish origin What is your race?: A. White Do you need or want an interpreter to communicate with a doctor or health care staff?: 0. No  Patient's Response To:  Health Literacy and Transportation Is the patient able to respond to health literacy and transportation needs?: Yes Health Literacy - How often do you need to have someone help you when you  read instructions, pamphlets, or other written material from your doctor or  pharmacy?: Never In the past 12 months, has lack of transportation kept you from medical appointments or from getting medications?: No In the past 12 months, has lack of transportation kept you from meetings, work, or from getting things needed for daily living?: No  Journalist, newspaper / Equipment Home Equipment: Higher education careers adviser held shower head, Shower seat, Agricultural consultant (2 wheels), Rollator (4 wheels), Cane - quad, [place] - single point  Prior Device Use: Indicate devices/aids used by the patient prior to current illness, exacerbation or injury? None of the above  Current Functional Level Cognition  Orientation Level: Oriented X4    Extremity Assessment (includes Sensation/Coordination)  Upper Extremity Assessment: Defer to OT evaluation  Lower Extremity Assessment: Generalized weakness (h/o neuropathy bil LEs)    ADLs  Overall ADL's : Needs assistance/impaired Eating/Feeding: Independent, Sitting Grooming: Set up, Sitting Upper Body Bathing: Minimal assistance, Sitting Lower Body Bathing: Total assistance, +2 for physical assistance, Sit to/from stand Upper Body Dressing : Moderate assistance, Sitting Lower Body Dressing: Total assistance, +2 for physical assistance, Sit to/from stand Toilet Transfer: Minimal assistance, +2 for safety/equipment, Ambulation Toileting- Clothing Manipulation and Hygiene: Total assistance, +2 for physical assistance, Sit to/from stand Functional mobility during ADLs: Minimal assistance, +2 for safety/equipment, Rolling walker (2 wheels)    Mobility  General bed mobility comments: received in chair    Transfers  Overall transfer level: Needs assistance Equipment used: Rolling walker (2 wheels) Transfers: Sit to/from Stand Sit to Stand: +2 physical assistance, Min assist General transfer comment: assist initially to scoot buttocks to edge of chair, cues for hands on knees and use  of momentum, assist to rise and steady    Ambulation / Gait / Stairs / Wheelchair Mobility  Ambulation/Gait Ambulation/Gait assistance: Min assist, +2 safety/equipment Gait Distance (Feet): 8 Feet Assistive device: Rolling walker (2 wheels) Gait Pattern/deviations: Decreased stride length General Gait Details: limited by dizziness and need to sit down; ?med related Gait velocity: decreased Gait velocity interpretation: <1.8 ft/sec, indicate of risk for recurrent falls    Posture / Balance Balance Overall balance assessment: Needs assistance Sitting balance-Leahy Scale: Fair Standing balance-Leahy Scale: Poor Standing balance comment: reliant on RW    Special needs/care consideration Skin surgical incision    Previous Home Environment (from acute therapy documentation) Living Arrangements: Spouse/significant other  Lives With: Spouse Available Help at Discharge: Family, Available 24 hours/day Type of Home: House Home Layout: One level Home Access: Stairs to enter [employer] of Steps: 1 Bathroom Shower/Tub: Health visitor: Handicapped height Home Care Services: No Additional Comments: Cares for wife who has Parkinson's (provides PRN physical assistance) & daughters assist him daily  Discharge Living Setting Plans for Discharge Living Setting: Patient's home Type of Home at Discharge: House Discharge Home Layout: One level Discharge Home Access: Stairs to enter Entrance Stairs-Rails: None Entrance Stairs-Number of Steps: 1 Discharge Bathroom Shower/Tub: Walk-in shower Discharge Bathroom Toilet: Handicapped height Discharge Bathroom Accessibility: No Does the patient have any problems obtaining your medications?: No  Social/Family/Support Systems Patient Roles: Other (Comment) Contact Information: [name] (daughter) Anticipated Caregiver: [phone] Caregiver Availability: 24/7 Discharge Plan Discussed with Primary Caregiver:  Yes  Goals Patient/Family Goal for Rehab: PT/OT Supervision Expected length of stay: 10-12 days Pt/Family Agrees to Admission and willing to participate: Yes Program Orientation Provided & Reviewed with Pt/Caregiver Including Roles  & Responsibilities: Yes  Decrease burden of Care through IP rehab admission: not anticipated  Possible need for SNF placement upon discharge: not anticipated  Patient Condition: I  have reviewed medical records from [facility], spoken with CM, and patient and daughter. I met with patient at the bedside for inpatient rehabilitation assessment.  Patient will benefit from ongoing PT and OT, can actively participate in 3 hours of therapy a day 5 days of the week, and can make measurable gains during the admission.  Patient will also benefit from the coordinated team approach during an Inpatient Acute Rehabilitation admission.  The patient will receive intensive therapy as well as Rehabilitation physician, nursing, social worker, and care management interventions.  Due to safety, skin/wound care, disease management, medication administration, pain management, and patient education the patient requires 24 hour a day rehabilitation nursing.  The patient is currently min A with mobility and basic ADLs.  Discharge setting and therapy post discharge at home with home health is anticipated.  Patient has agreed to participate in the Acute Inpatient Rehabilitation Program and will admit {Time; today/tomorrow:10263}.  Preadmission Screen Completed By:  Dorena Gander, 08/06/2023 8:18 AM ______________________________________________________________________   Discussed status with Dr. Aaron Aas on *** at *** and received approval for admission today.  Admission Coordinator:  Dorena Gander, CCC-SLP, time Aaron AasAlanna Hu ***   Assessment/Plan: Diagnosis: *** Does the need for close, 24 hr/day Medical supervision in concert with the patient's rehab needs make it unreasonable for  this patient to be served in a less intensive setting? {yes_no_potentially:3041433} Co-Morbidities requiring supervision/potential complications: *** Due to {due NG:2952841}, does the patient require 24 hr/day rehab nursing? {yes_no_potentially:3041433} Does the patient require coordinated care of a physician, rehab nurse, PT, OT, and SLP to address physical and functional deficits in the context of the above medical diagnosis(es)? {yes_no_potentially:3041433} Addressing deficits in the following areas: {deficits:3041436} Can the patient actively participate in an intensive therapy program of at least 3 hrs of therapy 5 days a week? {yes_no_potentially:3041433} The potential for patient to make measurable gains while on inpatient rehab is {potential:3041437} Anticipated functional outcomes upon discharge from inpatient rehab: {functional outcomes:304600100} PT, {functional outcomes:304600100} OT, {functional outcomes:304600100} SLP Estimated rehab length of stay to reach the above functional goals is: *** Anticipated discharge destination: {anticipated dc setting:21604} 10. Overall Rehab/Functional Prognosis: {potential:3041437}   MD Signature: ***

## 2023-08-06 NOTE — Progress Notes (Signed)
   08/06/23 0027  BiPAP/CPAP/SIPAP  $ Non-Invasive Ventilator  Non-Invasive Vent Subsequent  BiPAP/CPAP/SIPAP Pt Type Adult  BiPAP/CPAP/SIPAP DREAMSTATIOND  Mask Type Nasal pillows  Mask Size Large  IPAP 8 cmH20  Flow Rate 3 lpm  Patient Home Machine No  Safety Check Completed by RT for Home Unit Yes, no issues noted  Patient Home Mask Yes  Patient Home Tubing Yes  Device Plugged into RED Power Outlet Yes  BiPAP/CPAP /SiPAP Vitals  Pulse Rate 76  Resp 18  SpO2 96 %  Bilateral Breath Sounds Clear;Diminished  MEWS Score/Color  MEWS Score 0  MEWS Score Color Marrie Sizer

## 2023-08-06 NOTE — Progress Notes (Signed)
   08/06/23 2323  BiPAP/CPAP/SIPAP  BiPAP/CPAP/SIPAP Pt Type Adult  BiPAP/CPAP/SIPAP DREAMSTATIOND  Mask Type Nasal pillows (home)  Mask Size Large  EPAP  (6,16)  Flow Rate 3 lpm  Patient Home Machine No  Safety Check Completed by RT for Home Unit Yes, no issues noted  Patient Home Mask Yes  Patient Home Tubing Yes  Auto Titrate Yes

## 2023-08-06 NOTE — [place] FL2 (Signed)
 [place]  MEDICAID FL2 LEVEL OF CARE FORM     IDENTIFICATION  Patient Name: [name]. Birthdate: 24-Nov-1950 Sex: male Admission Date (Current Location): 07/24/2023  [facility] and [place] Number:  Producer, television/film/video and Address:  [facility], [address] [place], [place] [address]      Provider Number: [id number]  Attending Physician Name and Address:  Zelphia Higashi, MD  Relative Name and Phone Number:  Kruz Chiu; Daughter; [phone]    Current Level of Care: Hospital Recommended Level of Care: Skilled Nursing Facility Prior Approval Number:    Date Approved/Denied:   PASRR Number: [id number]  Discharge Plan: SNF    Current Diagnoses: Patient Active Problem List   Diagnosis Date Noted   Persistent atrial fibrillation (HCC) 08/03/2023   Ischemic cardiomyopathy 07/30/2023   Hypertension associated with diabetes (HCC) 07/30/2023   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   Cataract of left eye 07/28/2023   Benign prostatic hyperplasia with lower urinary tract symptoms 07/28/2023   Gastric contents in larynx causing other injury, initial encounter 07/28/2023   Generalized anxiety disorder 07/28/2023   Personal history of colon polyps, unspecified 07/28/2023   Morbid obesity (HCC) 07/28/2023   Non-toxic multinodular goiter 07/28/2023   Phlebitis and thrombophlebitis of lower extremities 07/28/2023   Sensorineural hearing loss, bilateral 07/28/2023   Statin not tolerated 07/28/2023   Coronary artery disease involving native coronary artery of native heart without angina pectoris 07/27/2023   NSTEMI (non-ST elevated myocardial infarction) (HCC)  Cardiomyopathy 07/24/2023   T2DM (type 2 diabetes mellitus) (HCC) 07/24/2023   COPD (chronic obstructive pulmonary disease) (HCC) 07/24/2023   Alcohol use  Hyponatremia 07/24/2023   Chronic health problem 07/24/2023   Paroxysmal atrial fibrillation (HCC) 09/22/2021   Mixed hyperlipidemia  09/20/2021   Diverticulitis 09/07/2019   Constipated 09/07/2019   Dehydration 09/07/2019   Constipation 09/07/2019   Colon stricture - diverticular (HCC) 09/07/2019   Chest pain 07/31/2019   Sleep apnea 07/31/2019   Essential hypertension, benign    Dyspepsia    Type 2 diabetes mellitus with hyperglycemia, with long-term current use of insulin  (HCC)     Orientation RESPIRATION BLADDER Height & Weight     Self, Time, Situation, Place  Normal (Room Air) Incontinent, External catheter Weight: 270 lb 4.5 oz (122.6 kg) Height:  6\' 2"  (188 cm)  BEHAVIORAL SYMPTOMS/MOOD NEUROLOGICAL BOWEL NUTRITION STATUS      Continent Diet (Please see discharge summary)  AMBULATORY STATUS COMMUNICATION OF NEEDS Skin   Limited Assist Verbally Other (Comment) (Incision (Closed) 07/30/23 Leg Right; Incision (Closed) 07/30/23 Chest Other (Comment))                       Personal Care Assistance Level of Assistance  Bathing, Dressing, Feeding Bathing Assistance: Limited assistance Feeding assistance: Limited assistance Dressing Assistance: Limited assistance     Functional Limitations Info             SPECIAL CARE FACTORS FREQUENCY  PT (By licensed PT), OT (By licensed OT)     PT Frequency: 5x OT Frequency: 5x            Contractures Contractures Info: Not present    Additional Factors Info  Code Status, Insulin  Sliding Scale, Allergies Code Status Info: Full Code Allergies Info: Ace Inhibitors; Gabapentin; Peanut Oil; Tamiflu; Losartan; Lyrica; Oxcarbazepine; Statins; Avelox (Moxifloxacin Hcl In Nacl); Ciprofloxacin ; Citalopram; Victoza (Liraglutide)   Insulin  Sliding Scale Info: Please see discharge summary  Current Medications (08/06/2023):  This is the current hospital active medication list Current Facility-Administered Medications  Medication Dose Route Frequency Provider Last Rate Last Admin   acetaminophen  (TYLENOL ) tablet 650 mg  650 mg Oral Q6H PRN Hilarie Lovely, MD   650 mg at 08/06/23 1242   albuterol  (PROVENTIL ) (2.5 MG/3ML) 0.083% nebulizer solution 2.5 mg  2.5 mg Inhalation Q6H PRN Zelphia Higashi, MD       ALPRAZolam  (XANAX ) tablet 0.25-0.5 mg  0.25-0.5 mg Oral Q6H PRN Zelphia Higashi, MD   0.5 mg at 08/05/23 2113   alum & mag hydroxide-simeth (MAALOX/MYLANTA) 200-200-20 MG/5ML suspension 15 mL  15 mL Oral Q6H PRN Zelphia Higashi, MD   15 mL at 08/05/23 2309   apixaban  (ELIQUIS ) tablet 5 mg  5 mg Oral BID Zelphia Higashi, MD   5 mg at 08/06/23 1610   aspirin  EC tablet 81 mg  81 mg Oral Daily Zelphia Higashi, MD   81 mg at 08/06/23 9604   bisacodyl  (DULCOLAX) EC tablet 10 mg  10 mg Oral Daily Hendrickson, Steven C, MD   10 mg at 08/06/23 5409   Or   bisacodyl  (DULCOLAX) suppository 10 mg  10 mg Rectal Daily Zelphia Higashi, MD       carvedilol  (COREG ) tablet 12.5 mg  12.5 mg Oral BID Zelphia Higashi, MD   12.5 mg at 08/06/23 8119   cromolyn  (OPTICROM ) 4 % ophthalmic solution 1 drop  1 drop Both Eyes QID PRN Zelphia Higashi, MD       docusate sodium  (COLACE) capsule 200 mg  200 mg Oral Daily Hendrickson, Steven C, MD   200 mg at 08/06/23 1478   fluticasone  (FLONASE ) 50 MCG/ACT nasal spray 2 spray  2 spray Each Nare Daily Zelphia Higashi, MD   2 spray at 08/06/23 2956   folic acid  (FOLVITE ) tablet 1 mg  1 mg Oral Daily Hendrickson, Steven C, MD   1 mg at 08/06/23 2130   furosemide  (LASIX ) injection 40 mg  40 mg Intravenous Q12H Barrett, Erin R, PA-C   40 mg at 08/06/23 8657   guaiFENesin  (MUCINEX ) 12 hr tablet 600 mg  600 mg Oral BID Randa Burton, PA-C   600 mg at 08/06/23 8469   hydrOXYzine  (ATARAX ) tablet 25 mg  25 mg Oral BID PRN Hendrickson, Steven C, MD   25 mg at 08/02/23 0252   insulin  aspart (novoLOG ) injection 0-20 Units  0-20 Units Subcutaneous TID WC Hendrickson, Steven C, MD   4 Units at 08/06/23 1334   insulin  aspart (novoLOG ) injection 0-5 Units  0-5 Units Subcutaneous  QHS Zelphia Higashi, MD   2 Units at 08/05/23 2111   insulin  glargine-yfgn (SEMGLEE ) injection 20 Units  20 Units Subcutaneous BID Roddenberry, Myron G, PA-C   20 Units at 08/06/23 6295   lactulose (CHRONULAC) 10 GM/15ML solution 20 g  20 g Oral Daily PRN Stehler, Bailey C, PA-C       metoprolol  tartrate (LOPRESSOR ) injection 2.5-5 mg  2.5-5 mg Intravenous Q2H PRN Zelphia Higashi, MD       multivitamin with minerals tablet 1 tablet  1 tablet Oral Daily Hendrickson, Steven C, MD   1 tablet at 08/06/23 2841   olopatadine  (PATANOL) 0.1 % ophthalmic solution 1 drop  1 drop Both Eyes BID Hendrickson, Steven C, MD   1 drop at 08/06/23 0929   ondansetron  (ZOFRAN ) injection 4 mg  4 mg Intravenous Q6H PRN Luna Salinas,  Milon Aloe, MD   4 mg at 08/06/23 1248   oxyCODONE  (Oxy IR/ROXICODONE ) immediate release tablet 5-10 mg  5-10 mg Oral Q3H PRN Zelphia Higashi, MD   10 mg at 08/06/23 1610   pantoprazole  (PROTONIX ) EC tablet 40 mg  40 mg Oral Daily Hendrickson, Steven C, MD   40 mg at 08/06/23 1006   polyethylene glycol (MIRALAX  / GLYCOLAX ) packet 17 g  17 g Oral Daily Randa Burton, PA-C   17 g at 08/06/23 1006   potassium chloride  SA (KLOR-CON  M) CR tablet 20 mEq  20 mEq Oral TID Roddenberry, Myron G, PA-C   20 mEq at 08/06/23 1006   sodium chloride  flush (NS) 0.9 % injection 3 mL  3 mL Intravenous Q12H Zelphia Higashi, MD   3 mL at 08/06/23 1007   sodium chloride  flush (NS) 0.9 % injection 3 mL  3 mL Intravenous PRN Zelphia Higashi, MD       terazosin  (HYTRIN ) capsule 2 mg  2 mg Oral QHS Hendrickson, Steven C, MD   2 mg at 08/05/23 2113   thiamine  (VITAMIN B1) tablet 100 mg  100 mg Oral Daily Zelphia Higashi, MD   100 mg at 08/06/23 1006   Or   thiamine  (VITAMIN B1) injection 100 mg  100 mg Intravenous Daily Hendrickson, Steven C, MD       Tiotropium Bromide  Monohydrate AERS 2 puff  2 puff Inhalation Daily Zelphia Higashi, MD   2 puff at 08/06/23 9604   traMADol   (ULTRAM ) tablet 50 mg  50 mg Oral Q6H PRN Barrett, Erin R, PA-C   50 mg at 08/06/23 5409   traZODone  (DESYREL ) tablet 100 mg  100 mg Oral QHS Zelphia Higashi, MD   100 mg at 08/05/23 2113     Discharge Medications: Please see discharge summary for a list of discharge medications.  Relevant Imaging Results:  Relevant Lab Results:   Additional Information SS# [id number]  Juliane Och, LCSW

## 2023-08-06 NOTE — Progress Notes (Signed)
 Physical Therapy Treatment Patient Details Name: [name]. MRN: [id number] DOB: Jun 21, 1950 Today's Date: 08/06/2023   History of Present Illness Pt is a 73 y/o M admitted on 07/24/23 after presenting with c/o SOB. Pt found to have NSTEMI, now s/p L/R [facility] which showed significant LAD stenosis. 6/3 CABG PMH: DM2, a-fib on Eliquis , HTN, HLD, RBBB, OSA on CPAP, anxiety, diverticulitis, neuropathy, Hepatitis, SDH    PT Comments  Patient eager to get up on PT's return. Required max cues and mod assist to roll onto side and side to sit (with HOB elevated). Requesting elevated bed for sit to stand. Elevated ~4" and pt stood with very light min assist. Ambulated with RW with instructional cues x110 ft on RA with VSS. Rehab Admissions Coordinator in during session and daughters/patient feel he will need incr time to regain his independence for return home. Patient will benefit from continued inpatient follow up therapy, <3 hours/day    If plan is discharge home, recommend the following: Assistance with cooking/housework;Assist for transportation;Help with stairs or ramp for entrance;A little help with walking and/or transfers;A little help with bathing/dressing/bathroom   Can travel by private vehicle     Yes  Equipment Recommendations  None recommended by PT    Recommendations for Other Services       Precautions / Restrictions Precautions Precautions: Sternal;Fall Precaution Booklet Issued: Yes (comment) Recall of Precautions/Restrictions: Impaired Precaution/Restrictions Comments: cues for hand placement for standing and sitting. Restrictions Weight Bearing Restrictions Per Provider Order: No     Mobility  Bed Mobility Overal bed mobility: Needs Assistance Bed Mobility: Rolling, Sidelying to Sit Rolling: Mod assist Sidelying to sit: Mod assist, HOB elevated       General bed mobility comments: after rolled, elevated HOB with feet over EOB and then mod assist to finish coming  to sit    Transfers Overall transfer level: Needs assistance Equipment used: Rolling walker (2 wheels) Transfers: Sit to/from Stand Sit to Stand: Min assist, From elevated surface           General transfer comment: pt requested bed elevated ~4"; stood with very light min assist with cues for hand placement (and with stand to sit)    Ambulation/Gait Ambulation/Gait assistance: Min assist Gait Distance (Feet): 110 Feet Assistive device: Rolling walker (2 wheels) Gait Pattern/deviations: Step-through pattern, Trunk flexed Gait velocity: decreased     General Gait Details: vc for proximity to RW and upright posture (especially when turning)   Stairs             Wheelchair Mobility     Tilt Bed    Modified Rankin (Stroke Patients Only)       Balance Overall balance assessment: Needs assistance Sitting-balance support: Feet supported Sitting balance-Leahy Scale: Good     Standing balance support: No upper extremity supported Standing balance-Leahy Scale: Fair                              Hotel manager: No apparent difficulties  Cognition Arousal: Alert Behavior During Therapy: WFL for tasks assessed/performed                             Following commands: Intact      Cueing Cueing Techniques: Verbal cues  Exercises      General Comments General comments (skin integrity, edema, etc.): on RA sats 98% (when pleth reading); HR 90s  Pertinent Vitals/Pain Pain Assessment Pain Assessment: Faces Faces Pain Scale: Hurts even more Pain Location: chest Pain Descriptors / Indicators: Sore Pain Intervention(s): Limited activity within patient's tolerance, Monitored during session, Premedicated before session, Repositioned    Home Living                          Prior Function            PT Goals (current goals can now be found in the care plan section) Acute Rehab PT  Goals Patient Stated Goal: go for some rehab Time For Goal Achievement: 08/16/23 Potential to Achieve Goals: Good Progress towards PT goals: Progressing toward goals    Frequency    Min 2X/week      PT Plan      Co-evaluation              AM-PAC PT "6 Clicks" Mobility   Outcome Measure  Help needed turning from your back to your side while in a flat bed without using bedrails?: A Lot Help needed moving from lying on your back to sitting on the side of a flat bed without using bedrails?: Total Help needed moving to and from a bed to a chair (including a wheelchair)?: A Lot Help needed standing up from a chair using your arms (e.g., wheelchair or bedside chair)?: A Lot Help needed to walk in hospital room?: A Little Help needed climbing 3-5 steps with a railing? : A Little 6 Click Score: 13    End of Session Equipment Utilized During Treatment: Gait belt Activity Tolerance: Patient tolerated treatment well Patient left: with call bell/phone within reach (on [facility]) Nurse Communication: Mobility status;Other (comment) (requested privacy and incr time on [facility]) PT Visit Diagnosis: Muscle weakness (generalized) (M62.81);Difficulty in walking, not elsewhere classified (R26.2);Pain Pain - part of body:  (chest)     Time: 1610-9604 PT Time Calculation (min) (ACUTE ONLY): 26 min  Charges:    $Gait Training: 23-37 mins PT General Charges $$ ACUTE PT VISIT: 1 Visit                      [name], PT Acute Rehabilitation Services  Office [phone]    [name] 08/06/2023, 1:43 PM

## 2023-08-06 NOTE — Progress Notes (Signed)
 Inpatient Rehab Admissions Coordinator:    I spoke with pt. And daughter. They both feel SNF would make more sense, as he could stay longer and try to be more independent before returning home. CIR will sign off.   Wandalee Gust, MS, CCC-SLP Rehab Admissions Coordinator  [phone] (celll) [phone] (office)

## 2023-08-06 NOTE — Progress Notes (Signed)
 CARDIAC REHAB PHASE I     Post OHS education including site care, restrictions, heart healthy diabetic diet, sternal precautions, IS use at home, home needs at discharge, exercise guidelines and CRP2 reviewed. All questions and concerns addressed. Will refer to [facility]  for CRP2. Called my daughter, [name], per pt request to discuss post op OHS education. All questions and concerns addressed.   [phone] Ronny Colas, RN BSN 08/06/2023 10:47 AM

## 2023-08-06 NOTE — Plan of Care (Signed)
 Problem: Education: Goal: Ability to describe self-care measures that may prevent or decrease complications (Diabetes Survival Skills Education) will improve Outcome: Progressing Goal: Individualized Educational Video(s) Outcome: Progressing   Problem: Coping: Goal: Ability to adjust to condition or change in health will improve Outcome: Progressing   Problem: Fluid Volume: Goal: Ability to maintain a balanced intake and output will improve Outcome: Progressing   Problem: Health Behavior/Discharge Planning: Goal: Ability to identify and utilize available resources and services will improve Outcome: Progressing Goal: Ability to manage health-related needs will improve Outcome: Progressing   Problem: Metabolic: Goal: Ability to maintain appropriate glucose levels will improve Outcome: Progressing   Problem: Nutritional: Goal: Maintenance of adequate nutrition will improve Outcome: Progressing Goal: Progress toward achieving an optimal weight will improve Outcome: Progressing   Problem: Skin Integrity: Goal: Risk for impaired skin integrity will decrease Outcome: Progressing   Problem: Tissue Perfusion: Goal: Adequacy of tissue perfusion will improve Outcome: Progressing   Problem: Education: Goal: Knowledge of General Education information will improve Description: Including pain rating scale, medication(s)/side effects and non-pharmacologic comfort measures Outcome: Progressing   Problem: Health Behavior/Discharge Planning: Goal: Ability to manage health-related needs will improve Outcome: Progressing   Problem: Clinical Measurements: Goal: Ability to maintain clinical measurements within normal limits will improve Outcome: Progressing Goal: Will remain free from infection Outcome: Progressing Goal: Diagnostic test results will improve Outcome: Progressing Goal: Respiratory complications will improve Outcome: Progressing Goal: Cardiovascular complication will  be avoided Outcome: Progressing   Problem: Activity: Goal: Risk for activity intolerance will decrease Outcome: Progressing   Problem: Nutrition: Goal: Adequate nutrition will be maintained Outcome: Progressing   Problem: Coping: Goal: Level of anxiety will decrease Outcome: Progressing   Problem: Elimination: Goal: Will not experience complications related to bowel motility Outcome: Progressing Goal: Will not experience complications related to urinary retention Outcome: Progressing   Problem: Pain Managment: Goal: General experience of comfort will improve and/or be controlled Outcome: Progressing   Problem: Safety: Goal: Ability to remain free from injury will improve Outcome: Progressing   Problem: Skin Integrity: Goal: Risk for impaired skin integrity will decrease Outcome: Progressing   Problem: Education: Goal: Understanding of CV disease, CV risk reduction, and recovery process will improve Outcome: Progressing Goal: Individualized Educational Video(s) Outcome: Progressing   Problem: Activity: Goal: Ability to return to baseline activity level will improve Outcome: Progressing   Problem: Cardiovascular: Goal: Ability to achieve and maintain adequate cardiovascular perfusion will improve Outcome: Progressing Goal: Vascular access site(s) Level 0-1 will be maintained Outcome: Progressing   Problem: Health Behavior/Discharge Planning: Goal: Ability to safely manage health-related needs after discharge will improve Outcome: Progressing   Problem: Education: Goal: Will demonstrate proper wound care and an understanding of methods to prevent future damage Outcome: Progressing Goal: Knowledge of disease or condition will improve Outcome: Progressing Goal: Knowledge of the prescribed therapeutic regimen will improve Outcome: Progressing Goal: Individualized Educational Video(s) Outcome: Progressing   Problem: Activity: Goal: Risk for activity  intolerance will decrease Outcome: Progressing   Problem: Cardiac: Goal: Will achieve and/or maintain hemodynamic stability Outcome: Progressing   Problem: Clinical Measurements: Goal: Postoperative complications will be avoided or minimized Outcome: Progressing   Problem: Respiratory: Goal: Respiratory status will improve Outcome: Progressing   Problem: Skin Integrity: Goal: Wound healing without signs and symptoms of infection Outcome: Progressing Goal: Risk for impaired skin integrity will decrease Outcome: Progressing   Problem: Urinary Elimination: Goal: Ability to achieve and maintain adequate renal perfusion and functioning will improve Outcome: Progressing

## 2023-08-06 NOTE — Progress Notes (Signed)
 Occupational Therapy Treatment Patient Details Name: [name]. MRN: [id number] DOB: 05-Dec-1950 Today's Date: 08/06/2023   History of present illness Pt is a 73 y/o M admitted on 07/24/23 after presenting with c/o SOB. Pt found to have NSTEMI, now s/p L/R [facility] which showed significant LAD stenosis. 6/3 CABG PMH: DM2, a-fib on Eliquis , HTN, HLD, RBBB, OSA on CPAP, anxiety, diverticulitis, neuropathy, Hepatitis, SDH   OT comments  Patient with good progress toward patient focused goals, currently needing up to Min A for in room mobility/toileting at [facility] level, and up to Max A for lower body ADL from a sit to stand level. Continues to need cues for sternal precautions.  Patient demonstrates the ability to tolerate agressive rehab and should reach a Mod I level prior to discharging home.  OT will continue efforts in the acute setting to address deficits.  Patient will benefit from intensive inpatient follow-up therapy, >3 hours/day.      If plan is discharge home, recommend the following:  Two people to help with walking and/or transfers;Two people to help with bathing/dressing/bathroom;Assistance with cooking/housework;Assist for transportation;Help with stairs or ramp for entrance   Equipment Recommendations  None recommended by OT    Recommendations for Other Services      Precautions / Restrictions Precautions Precautions: Sternal;Fall Recall of Precautions/Restrictions: Impaired Precaution/Restrictions Comments: cues for hand placement for standing and sitting. Restrictions Other Position/Activity Restrictions: Sternal       Mobility Bed Mobility Overal bed mobility: Needs Assistance Bed Mobility: Sit to Sidelying         Sit to sidelying: Mod assist      Transfers Overall transfer level: Needs assistance Equipment used: Rolling walker (2 wheels) Transfers: Sit to/from Stand, Bed to chair/wheelchair/BSC Sit to Stand: Min assist     Step pivot transfers: Contact  guard assist           Balance Overall balance assessment: Needs assistance Sitting-balance support: Feet supported Sitting balance-Leahy Scale: Good     Standing balance support: Reliant on assistive device for balance Standing balance-Leahy Scale: Fair                             ADL either performed or assessed with clinical judgement   ADL       Grooming: Set up;Sitting   Upper Body Bathing: Minimal assistance;Sitting   Lower Body Bathing: Moderate assistance;Sit to/from stand   Upper Body Dressing : Minimal assistance;Moderate assistance;Sitting   Lower Body Dressing: Moderate assistance;Sit to/from stand   Toilet Transfer: Minimal assistance;Ambulation;Rolling walker (2 wheels)   Toileting- Clothing Manipulation and Hygiene: Maximal assistance;Sit to/from stand              Extremity/Trunk Assessment Upper Extremity Assessment Upper Extremity Assessment: Overall WFL for tasks assessed   Lower Extremity Assessment Lower Extremity Assessment: Defer to PT evaluation   Cervical / Trunk Assessment Cervical / Trunk Assessment: Normal    Vision Baseline Vision/History: 1 Wears glasses Patient Visual Report: No change from baseline     Perception Perception Perception: Not tested   Praxis Praxis Praxis: Not tested   Communication Communication Communication: No apparent difficulties   Cognition Arousal: Alert Behavior During Therapy: WFL for tasks assessed/performed Cognition: No apparent impairments                               Following commands: Intact  Cueing   Cueing Techniques: Verbal cues  Exercises      Shoulder Instructions       General Comments  VSS on RA    Pertinent Vitals/ Pain       Pain Assessment Pain Assessment: Faces Faces Pain Scale: Hurts a little bit Pain Location: incision Pain Descriptors / Indicators: Sore Pain Intervention(s): Monitored during session, Premedicated before  session                                                          Frequency  Min 2X/week        Progress Toward Goals  OT Goals(current goals can now be found in the care plan section)  Progress towards OT goals: Progressing toward goals  Acute Rehab OT Goals OT Goal Formulation: With patient Time For Goal Achievement: 08/16/23 Potential to Achieve Goals: Good  Plan      Co-evaluation                 AM-PAC OT "6 Clicks" Daily Activity     Outcome Measure   Help from another person eating meals?: None Help from another person taking care of personal grooming?: A Little Help from another person toileting, which includes using toliet, bedpan, or urinal?: A Lot Help from another person bathing (including washing, rinsing, drying)?: A Lot Help from another person to put on and taking off regular upper body clothing?: A Lot Help from another person to put on and taking off regular lower body clothing?: A Lot 6 Click Score: 15    End of Session Equipment Utilized During Treatment: Rolling walker (2 wheels)  OT Visit Diagnosis: Unsteadiness on feet (R26.81);Other abnormalities of gait and mobility (R26.89);Pain;Muscle weakness (generalized) (M62.81);Other (comment)   Activity Tolerance Patient tolerated treatment well   Patient Left in bed;with call bell/phone within reach   Nurse Communication Mobility status        Time: 1610-9604 OT Time Calculation (min): 22 min  Charges: OT General Charges $OT Visit: 1 Visit OT Treatments $Self Care/Home Management : 8-22 mins  08/06/2023  RP, OTR/L  Acute Rehabilitation Services  Office:  [phone]   WALID HAIG 08/06/2023, 9:06 AM

## 2023-08-06 NOTE — TOC Progression Note (Signed)
 Transition of Care (TOC) - Progression Note    Patient Details  Name: Natalio Salois. MRN: [id number] Date of Birth: 07-Oct-1950  Transition of Care [facility]) CM/SW Contact  Juliane Och, LCSW Phone Number: 08/06/2023, 1:43 PM  Clinical Narrative:     1:43 PM Per PA and CIR admissions, patient and patient's family expressed interest in SNF. CSW sent referrals to SNFs within [facility].   Expected Discharge Plan: Skilled Nursing Facility Barriers to Discharge: Continued Medical Work up, SNF Pending bed offer, English as a second language teacher  Expected Discharge Plan and Services In-house Referral: Clinical Social Work Discharge Planning Services: CM Consult Post Acute Care Choice: Skilled Nursing Facility Living arrangements for the past 2 months: Single Family Home                                       Social Determinants of Health (SDOH) Interventions SDOH Screenings   Food Insecurity: No Food Insecurity (07/24/2023)  Housing: Low Risk  (07/24/2023)  Transportation Needs: No Transportation Needs (07/24/2023)  Utilities: Not At Risk (07/24/2023)  Social Connections: Moderately Integrated (07/24/2023)  Tobacco Use: Medium Risk (07/30/2023)    Readmission Risk Interventions     No data to display

## 2023-08-06 NOTE — Progress Notes (Addendum)
 [address]       [place] [address]             [phone]      7 Days Post-Op Procedure(s) (LRB): CORONARY ARTERY BYPASS GRAFTING (CABG) TIMES THREE USING THE LEFT INTERNAL MAMMARY ARTERY AND ENDOSCOPICALLY HARVESTED RIGHT GREATER SAPHENOUS VEIN (N/A) ECHOCARDIOGRAM, TRANSESOPHAGEAL, INTRAOPERATIVE (N/A) CLIPPING, LEFT ATRIAL APPENDAGE (Left) Subjective: The patient states he still has a lot of pain and cannot take a deep breath.   Objective: Vital signs in last 24 hours: Temp:  [97.6 F (36.4 C)-98.6 F (37 C)] 97.6 F (36.4 C) (06/09 0357) Pulse Rate:  [76-99] 99 (06/09 0357) Cardiac Rhythm: Atrial flutter;Bundle branch block (06/08 1900) Resp:  [18-20] 20 (06/09 0357) BP: (112-136)/(72-81) 112/72 (06/09 0357) SpO2:  [94 %-99 %] 99 % (06/09 0357) Weight:  [122.6 kg] 122.6 kg (06/09 0357)  Hemodynamic parameters for last 24 hours:    Intake/Output from previous day: 06/08 0701 - 06/09 0700 In: 840 [P.O.:840] Out: 3250 [Urine:3250] Intake/Output this shift: No intake/output data recorded.  General appearance: alert, cooperative, and no distress Neurologic: intact Heart: regular rate and rhythm, S1, S2 normal, no murmur, click, rub or gallop Lungs: diminished bibasilar breath sounds Abdomen: protuberant, +BS, nontender Extremities: edema 2+ BLE Wound: Clean and dry without sign of infection, ecchymosis of the EVH site  Lab Results: Recent Labs    08/06/23 0217  WBC 12.8*  HGB 11.3*  HCT 33.2*  PLT 457*   BMET:  Recent Labs    08/05/23 0218 08/06/23 0217  NA 128* 127*  K 3.5 3.7  CL 85* 82*  CO2 32 33*  GLUCOSE 49* 117*  BUN 21 24*  CREATININE 0.75 0.82  CALCIUM  8.6* 8.8*    PT/INR: No results for input(s): "LABPROT", "INR" in the last 72 hours. ABG    Component Value Date/Time   PHART 7.330 (L) 07/31/2023 0134   HCO3 24.8 07/31/2023 0134   TCO2 26 07/31/2023 0134   ACIDBASEDEF 1.0 07/31/2023 0134   O2SAT 97  07/31/2023 0134   CBG (last 3)  Recent Labs    08/05/23 1614 08/05/23 2102 08/06/23 0603  GLUCAP 165* 206* 106*    Assessment/Plan: S/P Procedure(s) (LRB): CORONARY ARTERY BYPASS GRAFTING (CABG) TIMES THREE USING THE LEFT INTERNAL MAMMARY ARTERY AND ENDOSCOPICALLY HARVESTED RIGHT GREATER SAPHENOUS VEIN (N/A) ECHOCARDIOGRAM, TRANSESOPHAGEAL, INTRAOPERATIVE (N/A) CLIPPING, LEFT ATRIAL APPENDAGE (Left)  CV: Hx of PAF on Eliquis  prior to admission. NSR HR 70s this AM, some aflutter HR 101 06/07. On Eliquis  and ASA 81mg . SBP 104-130s, on Coreg  12.5mg  BID. Holding Entresto for now due to soft BP and aggressively diuresing, hopefully can restart prior to d/c.   Pulm: Saturating well on RA this AM and uses BIPAP at night. CXR with bibasilar atelectasis and left pleural effusion improving. Continue diuresis, mucinex  and inhaler/nebs prn. Encourage IS, flutter valve and ambulation.   GI: +BM small, passing a lot of flatus. Tolerating a diet. Will change miralax  to daily and add lactulose prn.   Endo: T2DM, preop A1C 6.2. CBGs elevated at times on Semglee  20U BID and SSI.    Renal: Cr 0.82, stable. UO 3250cc/24hrs recorded. Weight recorded -12lbs from yesterday, unsure if accurate but will d/c zaroxolyn. Still has pitting edema BLE. Continue IV Lasix  BID. Place TED hose. Mild hyponatremia possibly due to hypervolemia. K 3.7. Supplement.  ID: Likely reactive leukocytosis, WBC 12.8. Afebrile, will clinically monitor.    Expected postop ABLA: H/H 11.3/33.2,  stable. Not clinically significant at this time.    DVT Prophylaxis: On Eliquis    Deconditioning: Possible CIR candidate, CIR is following. Work with PT/OT. Patient is unsure if daughters can help take care of him after CIR. He was taking care of his wife with Parkinsons prior to admission. CIR is reaching out to daughters.   Wound: Bruising of the right EVH tunnel but incisions are clean and dry without sign of infection.    Dispo:  Continue to work with PT/OT, possible CIR candidate if someone can take care of him afterwards.   LOS: 13 days    Randa Burton, PA-C 08/06/2023 Patient seen and examined, agree with findings and plan outlined above He is ready for CIR once approved, bed available  Landon Pinion C. Luna Salinas, MD [facility] [phone]

## 2023-08-07 ENCOUNTER — Inpatient Hospital Stay (HOSPITAL_COMMUNITY)

## 2023-08-07 LAB — BASIC METABOLIC PANEL WITH GFR
Anion gap: 7 (ref 5–15)
BUN: 26 mg/dL — ABNORMAL HIGH (ref 8–23)
CO2: 38 mmol/L — ABNORMAL HIGH (ref 22–32)
Calcium: 8.7 mg/dL — ABNORMAL LOW (ref 8.9–10.3)
Chloride: 83 mmol/L — ABNORMAL LOW (ref 98–111)
Creatinine, Ser: 0.86 mg/dL (ref 0.61–1.24)
GFR, Estimated: >60 mL/min (ref >60)
Glucose, Bld: 89 mg/dL (ref 70–99)
Potassium: 3.8 mmol/L (ref 3.5–5.1)
Sodium: 128 mmol/L — ABNORMAL LOW (ref 135–145)

## 2023-08-07 LAB — GLUCOSE, CAPILLARY
Glucose-Capillary: 87 mg/dL (ref 70–99)
Glucose-Capillary: 191 mg/dL — ABNORMAL HIGH (ref 70–99)
Glucose-Capillary: 142 mg/dL — ABNORMAL HIGH (ref 70–99)
Glucose-Capillary: 172 mg/dL — ABNORMAL HIGH (ref 70–99)
Glucose-Capillary: 125 mg/dL — ABNORMAL HIGH (ref 70–99)

## 2023-08-07 MED ORDER — POLYETHYLENE GLYCOL 3350 17 G PO PACK: 17.0000 g | PACK | Freq: Every day | ORAL | Status: DC | PRN

## 2023-08-07 MED ORDER — SACUBITRIL-VALSARTAN 24-26 MG PO TABS
1.0000 | ORAL_TABLET | Freq: Two times a day (BID) | ORAL | Status: DC
  Administered 2023-08-07 (×2): 1 via ORAL
  Filled 2023-08-07 (×3): qty 1

## 2023-08-07 MED ORDER — FUROSEMIDE 40 MG PO TABS
40.0000 mg | ORAL_TABLET | Freq: Two times a day (BID) | ORAL | Status: DC
  Administered 2023-08-07 (×2): 40 mg via ORAL
  Filled 2023-08-07 (×3): qty 1

## 2023-08-07 MED ORDER — INSULIN ASPART 100 UNIT/ML IJ SOLN
2.0000 [IU] | Freq: Three times a day (TID) | INTRAMUSCULAR | Status: DC
  Administered 2023-08-07 – 2023-08-10 (×8): 2 [IU] via SUBCUTANEOUS

## 2023-08-07 MED ORDER — OXYCODONE HCL 5 MG PO TABS
5.0000 mg | ORAL_TABLET | ORAL | Status: DC | PRN
  Administered 2023-08-07 (×2): 10 mg via ORAL
  Administered 2023-08-08 (×2): 5 mg via ORAL
  Administered 2023-08-08 – 2023-08-09 (×3): 10 mg via ORAL
  Administered 2023-08-09: 5 mg via ORAL
  Administered 2023-08-09: 10 mg via ORAL
  Administered 2023-08-10 (×2): 5 mg via ORAL
  Filled 2023-08-07: qty 1
  Filled 2023-08-07: qty 2
  Filled 2023-08-07 (×2): qty 1
  Filled 2023-08-07 (×6): qty 2
  Filled 2023-08-07: qty 1

## 2023-08-07 NOTE — Plan of Care (Signed)
  Problem: Education: Goal: Ability to describe self-care measures that may prevent or decrease complications (Diabetes Survival Skills Education) will improve Outcome: Progressing   Problem: Coping: Goal: Ability to adjust to condition or change in health will improve Outcome: Progressing   Problem: Fluid Volume: Goal: Ability to maintain a balanced intake and output will improve Outcome: Progressing   Problem: Health Behavior/Discharge Planning: Goal: Ability to identify and utilize available resources and services will improve Outcome: Progressing Goal: Ability to manage health-related needs will improve Outcome: Progressing   Problem: Metabolic: Goal: Ability to maintain appropriate glucose levels will improve Outcome: Progressing   Problem: Nutritional: Goal: Maintenance of adequate nutrition will improve Outcome: Progressing   Problem: Education: Goal: Knowledge of General Education information will improve Description: Including pain rating scale, medication(s)/side effects and non-pharmacologic comfort measures Outcome: Progressing

## 2023-08-07 NOTE — Progress Notes (Signed)
 Occupational Therapy Treatment Patient Details Name: [name]. MRN: [id number] DOB: 1950-10-17 Today's Date: 08/07/2023   History of present illness Pt is a 73 y/o M admitted on 07/24/23 after presenting with c/o SOB. Pt found to have NSTEMI, now s/p L/R [facility] which showed significant LAD stenosis. 6/3 CABG PMH: DM2, a-fib on Eliquis , HTN, HLD, RBBB, OSA on CPAP, anxiety, diverticulitis, neuropathy, Hepatitis, SDH   OT comments  Pt seen at request of RN to assist pt to [facility]. Reviewed sternal precautions and pt able to verbalize all precautions with increased time. Pt needing min A for STS up from chair and CGA for steps with RW to [facility]. Reviewed compensatory techniques for pericare and pt able to demo skill. Pt needing increased time to go to restroom, so left on [facility] with call bell in reach and RN/NT aware with instruction on provision of assist to facilitate anterior weight shift and rise up from [facility]. OT will continue to follow.       If plan is discharge home, recommend the following:  Two people to help with walking and/or transfers;Two people to help with bathing/dressing/bathroom;Assistance with cooking/housework;Assist for transportation;Help with stairs or ramp for entrance   Equipment Recommendations  None recommended by OT    Recommendations for Other Services      Precautions / Restrictions Precautions Precautions: Sternal;Fall Precaution Booklet Issued: Yes (comment) Recall of Precautions/Restrictions: Impaired Precaution/Restrictions Comments: cues for hand placement for standing and sitting. Restrictions Weight Bearing Restrictions Per Provider Order: No Other Position/Activity Restrictions: Sternal       Mobility Bed Mobility               General bed mobility comments: in chair on arrival and on [facility] on departure with call bell in hand    Transfers Overall transfer level: Needs assistance Equipment used: Rolling walker (2 wheels) Transfers: Sit to/from  Stand Sit to Stand: Min assist           General transfer comment: up from recliner chair; pt needing cues for hand placement and appropriate use of momentum. Assist for anterior weight shift and then steady on rise     Balance Overall balance assessment: Needs assistance Sitting-balance support: Feet supported Sitting balance-Leahy Scale: Good     Standing balance support: No upper extremity supported Standing balance-Leahy Scale: Fair Standing balance comment: reliant on RW                           ADL either performed or assessed with clinical judgement   ADL Overall ADL's : Needs assistance/impaired     Grooming: Contact guard assist;Standing;Wash/dry face                   Toilet Transfer: Minimal assistance;Ambulation;Rolling walker (2 wheels);BSC/3in1 Toilet Transfer Details (indicate cue type and reason): for rise and lower Toileting- Clothing Manipulation and Hygiene: Supervision/safety;Sit to/from stand       Functional mobility during ADLs: Minimal assistance;Rolling walker (2 wheels)      Extremity/Trunk Assessment Upper Extremity Assessment Upper Extremity Assessment: Overall WFL for tasks assessed   Lower Extremity Assessment Lower Extremity Assessment: Defer to PT evaluation        Vision       Perception     Praxis     Communication Communication Communication: No apparent difficulties Factors Affecting Communication: Hearing impaired   Cognition Arousal: Alert Behavior During Therapy: WFL for tasks assessed/performed Cognition: No apparent impairments  OT - Cognition Comments: recalls precautions but needs up to min cues for functional implementation during mobility and BADL                 Following commands: Intact        Cueing   Cueing Techniques: Verbal cues  Exercises      Shoulder Instructions       General Comments      Pertinent Vitals/ Pain       Pain Assessment Pain  Assessment: Faces Faces Pain Scale: Hurts little more Pain Location: chest Pain Descriptors / Indicators: Sore Pain Intervention(s): Limited activity within patient's tolerance  Home Living                                          Prior Functioning/Environment              Frequency  Min 2X/week        Progress Toward Goals  OT Goals(current goals can now be found in the care plan section)  Progress towards OT goals: Progressing toward goals  Acute Rehab OT Goals OT Goal Formulation: With patient Time For Goal Achievement: 08/16/23 Potential to Achieve Goals: Good ADL Goals Pt Will Perform Grooming: with supervision;standing Pt Will Perform Upper Body Dressing: with set-up;sitting Pt Will Perform Lower Body Dressing: with min assist;sit to/from stand Pt Will Transfer to Toilet: with supervision;ambulating;bedside commode Pt Will Perform Toileting - Clothing Manipulation and hygiene: with min assist;sit to/from stand Additional ADL Goal #1: Pt will generalize sternal precautions in mobility and ADLs. Additional ADL Goal #2: Pt will generalize energy conservation strategies during ADLs and mobililty.  Plan      Co-evaluation                 AM-PAC OT "6 Clicks" Daily Activity     Outcome Measure   Help from another person eating meals?: None Help from another person taking care of personal grooming?: A Little Help from another person toileting, which includes using toliet, bedpan, or urinal?: A Lot Help from another person bathing (including washing, rinsing, drying)?: A Lot Help from another person to put on and taking off regular upper body clothing?: A Lot Help from another person to put on and taking off regular lower body clothing?: A Lot 6 Click Score: 15    End of Session Equipment Utilized During Treatment: Rolling walker (2 wheels)  OT Visit Diagnosis: Unsteadiness on feet (R26.81);Other abnormalities of gait and mobility  (R26.89);Pain;Muscle weakness (generalized) (M62.81);Other (comment)   Activity Tolerance Patient tolerated treatment well   Patient Left with call bell/phone within reach;Other (comment) (on [facility] per RN request for assist)   Nurse Communication Mobility status        Time: 4540-9811 OT Time Calculation (min): 12 min  Charges: OT General Charges $OT Visit: 1 Visit OT Treatments $Self Care/Home Management : 8-22 mins  Karilyn Ouch, OTR/L [facility] Acute Rehabilitation Office: [phone]   Emery Hans 08/07/2023, 9:28 AM

## 2023-08-07 NOTE — Progress Notes (Signed)
 Mobility Specialist Progress Note;   08/07/23 1440  Mobility  Activity Ambulated with assistance in hallway;Transferred from bed to chair  Level of Assistance Moderate assist, patient does 50-74%  Assistive Device Front wheel walker  Distance Ambulated (ft) 80 ft  RUE Weight Bearing Per Provider Order WBAT  LUE Weight Bearing Per Provider Order WBAT  Activity Response Tolerated well  Mobility Referral Yes  Mobility visit 1 Mobility  Mobility Specialist Start Time (ACUTE ONLY) 1440  Mobility Specialist Stop Time (ACUTE ONLY) 1505  Mobility Specialist Time Calculation (min) (ACUTE ONLY) 25 min   Pt agreeable to mobility. On CPAP upon arrival. Required ModA for bed mobility to assist raising trunk, light MinA to stand w/ elevated bed, and MinG during ambulation. Pt followed sternal precautions well. Requested to ambulate on O2 for comfort, ambulated on 2LO2 to maintain SPO2. No c/o when asked. Transferred pt to chair at [facility]. Pt left in chair with all needs met, call bell in reach. On 3LO2.   Janit Meline Mobility Specialist Please contact via SecureChat or [employer] [phone]

## 2023-08-07 NOTE — Progress Notes (Signed)
 Mobility Specialist Progress Note;   08/07/23 0932  Mobility  Activity Refused mobility  Mobility Specialist Start Time (ACUTE ONLY) 0932   Pt deferred mobility at this time d/t just getting repositioned in bed and receiving oxycodone . Will f/u this afternoon as able.   Janit Meline Mobility Specialist Please contact via [employer] or [employer] [phone]

## 2023-08-07 NOTE — Progress Notes (Addendum)
 [address]       [place] [place] [address]             [phone]      8 Days Post-Op Procedure(s) (LRB): CORONARY ARTERY BYPASS GRAFTING (CABG) TIMES THREE USING THE LEFT INTERNAL MAMMARY ARTERY AND ENDOSCOPICALLY HARVESTED RIGHT GREATER SAPHENOUS VEIN (N/A) ECHOCARDIOGRAM, TRANSESOPHAGEAL, INTRAOPERATIVE (N/A) CLIPPING, LEFT ATRIAL APPENDAGE (Left) Subjective: Patient reports he had a good walk yesterday and a large bowel movement. Continues to complain of pain and trouble taking a deep breath.  Objective: Vital signs in last 24 hours: Temp:  [97.8 F (36.6 C)-98.8 F (37.1 C)] 97.8 F (36.6 C) (06/10 0400) Pulse Rate:  [73-99] 87 (06/10 0500) Cardiac Rhythm: Atrial flutter;Bundle branch block (06/09 1900) Resp:  [13-19] 13 (06/10 0500) BP: (119-132)/(71-77) 129/75 (06/10 0400) SpO2:  [96 %-98 %] 98 % (06/10 0500) Weight:  [129.4 kg] 129.4 kg (06/10 0500)  Hemodynamic parameters for last 24 hours:    Intake/Output from previous day: 06/09 0701 - 06/10 0700 In: 240 [P.O.:240] Out: 1750 [Urine:1750] Intake/Output this shift: No intake/output data recorded.  General appearance: alert, cooperative, and no distress Neurologic: intact Heart: irregularly irregular rhythm Lungs: diminished bibasilar breath sounds Abdomen: soft, non-tender; bowel sounds normal; no masses,  no organomegaly Extremities: edema 1+, TED hose in place Wound: EVH and sternal wounds are clean and dry without sign of infection, ecchymosis of the EVH tunnel, necrosis of the bottom of his right 2nd toe  Lab Results: Recent Labs    08/06/23 0217  WBC 12.8*  HGB 11.3*  HCT 33.2*  PLT 457*   BMET:  Recent Labs    08/06/23 0217 08/07/23 0253  NA 127* 128*  K 3.7 3.8  CL 82* 83*  CO2 33* 38*  GLUCOSE 117* 89  BUN 24* 26*  CREATININE 0.82 0.86  CALCIUM  8.8* 8.7*    PT/INR: No results for input(s): "LABPROT", "INR" in the last 72 hours. ABG    Component Value  Date/Time   PHART 7.330 (L) 07/31/2023 0134   HCO3 24.8 07/31/2023 0134   TCO2 26 07/31/2023 0134   ACIDBASEDEF 1.0 07/31/2023 0134   O2SAT 97 07/31/2023 0134   CBG (last 3)  Recent Labs    08/06/23 1610 08/06/23 2113 08/07/23 0544  GLUCAP 218* 161* 87    Assessment/Plan: S/P Procedure(s) (LRB): CORONARY ARTERY BYPASS GRAFTING (CABG) TIMES THREE USING THE LEFT INTERNAL MAMMARY ARTERY AND ENDOSCOPICALLY HARVESTED RIGHT GREATER SAPHENOUS VEIN (N/A) ECHOCARDIOGRAM, TRANSESOPHAGEAL, INTRAOPERATIVE (N/A) CLIPPING, LEFT ATRIAL APPENDAGE (Left)  Neuro: Patient trouble taking deep breaths due to pain but does not like to take oxycodone . We have discussed proper use of narcotic pain medication as needed.   CV: Hx of PAF on Eliquis  prior to admission. Afib HR 100-105 this AM, overall controlled rates. On Eliquis  and ASA 81mg . SBP 110s-130s, on Coreg  12.5mg  BID. Will start low dose of Entresto.    Pulm: Saturating well on RA this AM and uses CPAP at night. CXR with bibasilar atelectasis and improved left pleural effusion. Continue diuresis, mucinex  and inhaler/nebs prn. Encourage IS, flutter valve and ambulation.   GI: +BM yesterday. Tolerating a diet.    Endo: T2DM, preop A1C 6.2. CBGs elevated around mealtimes will start 2U Novolog  with meals and d/c bedtime correction scale since he CBGs run low in the AM.   Renal: Cr 0.86, stable. UO 1750cc/24hrs recorded. Weight recorded +15lbs from yesterday, not accurate. Still has pitting edema BLE. Continue IV Lasix   BID. Continue TED hose. Mild hyponatremia possibly due to hypervolemia. K 3.8, Supplement.   ID: Likely reactive leukocytosis, WBC 12.8. Afebrile, will clinically monitor.    Expected postop ABLA: H/H 11.3/33.2, stable. Not clinically significant at this time.    DVT Prophylaxis: On Eliquis    Deconditioning: Continue work with PT/OT. Plan for SNF at discharge because will not have 24hr support after discharge so not a CIR candidate.     Wound: Bruising of the right EVH tunnel but incisions are clean and dry without sign of infection. Right second toe with some necrosis but patient states he sees the podiatrist who shaves it down and it looks better than it used to. Reports there used to be more necrotic tissue and podiatrist may have to amputate the toe at some point. Will closely monitor and patient will need close follow up with podiatry.    Dispo: Plan for SNF at discharge, hopefully can d/c to SNF this week.     LOS: 14 days    Randa Burton, PA-C 08/07/2023 Patient seen and examined, agree with above I think we can switch to PO Lasix  at this point  Landon Pinion C. Luna Salinas, MD [facility] [phone]

## 2023-08-07 NOTE — TOC Progression Note (Addendum)
 Transition of Care (TOC) - Progression Note    Patient Details  Name: [name]. MRN: [id number] Date of Birth: 1951/02/17  Transition of Care [facility]) CM/SW Contact  Juliane Och, LCSW Phone Number: 08/07/2023, 4:04 PM  Clinical Narrative:     4:04 PM CSW provided patient with current SNF with patient's current bed offers ([facility], [address], [address], [address], [address], [facility] [place]) and [facility]. Patient deferred SNF decision to patient's daughter, [name]. Patient expressed interest in CSW speaking with [name] and [facility] social worker, Peterson Brandt [phone]). CSW attempted to speak with Peterson Brandt but there was no response and a voicemail was left. CSW spoke with [name] who consented CSW to email options ([name]) CSW emailed [name] patient's SNF options with Medicare ratings.  4:17 PM CSW spoke with Peterson Brandt who informed CSW that [facility] is only SNF in [facility] in network with patient's VA insurance.  Expected Discharge Plan: Skilled Nursing Facility Barriers to Discharge: English as a second language teacher, Continued Medical Work up, SNF Pending bed offer, Other (must enter comment) (SNF pending bed decision)  Expected Discharge Plan and Services In-house Referral: Clinical Social Work Discharge Planning Services: CM Consult Post Acute Care Choice: Skilled Nursing Facility Living arrangements for the past 2 months: Single Family Home                                       Social Determinants of Health (SDOH) Interventions SDOH Screenings   Food Insecurity: No Food Insecurity (07/24/2023)  Housing: Low Risk  (07/24/2023)  Transportation Needs: No Transportation Needs (07/24/2023)  Utilities: Not At Risk (07/24/2023)  Social Connections: Moderately Integrated (07/24/2023)  Tobacco Use: Medium Risk (07/30/2023)    Readmission Risk Interventions     No data to  display

## 2023-08-08 LAB — CBC
HCT: 35 % — ABNORMAL LOW (ref 39.0–52.0)
Hemoglobin: 11.8 g/dL — ABNORMAL LOW (ref 13.0–17.0)
MCH: 29.5 pg (ref 26.0–34.0)
MCHC: 33.7 g/dL (ref 30.0–36.0)
MCV: 87.5 fL (ref 80.0–100.0)
Platelets: 633 10*3/uL — ABNORMAL HIGH (ref 150–400)
RBC: 4 MIL/uL — ABNORMAL LOW (ref 4.22–5.81)
RDW: 13.3 % (ref 11.5–15.5)
WBC: 15.4 10*3/uL — ABNORMAL HIGH (ref 4.0–10.5)
nRBC: 0 % (ref 0.0–0.2)

## 2023-08-08 LAB — GLUCOSE, CAPILLARY
Glucose-Capillary: 84 mg/dL (ref 70–99)
Glucose-Capillary: 160 mg/dL — ABNORMAL HIGH (ref 70–99)
Glucose-Capillary: 153 mg/dL — ABNORMAL HIGH (ref 70–99)
Glucose-Capillary: 153 mg/dL — ABNORMAL HIGH (ref 70–99)

## 2023-08-08 LAB — URINALYSIS, ROUTINE W REFLEX MICROSCOPIC
Bilirubin Urine: NEGATIVE
Glucose, UA: NEGATIVE mg/dL
Ketones, ur: NEGATIVE mg/dL
Nitrite: NEGATIVE
Protein, ur: NEGATIVE mg/dL
Specific Gravity, Urine: 1.014 (ref 1.005–1.030)
WBC, UA: >50 WBC/hpf (ref 0–5)
pH: 6 (ref 5.0–8.0)

## 2023-08-08 MED ORDER — POLYETHYLENE GLYCOL 3350 17 G PO PACK
17.0000 g | PACK | Freq: Every day | ORAL | Status: DC
  Administered 2023-08-08: 17 g via ORAL
  Filled 2023-08-08 (×3): qty 1

## 2023-08-08 MED ORDER — ACETAMINOPHEN 325 MG PO TABS: 650.0000 mg | ORAL_TABLET | ORAL | Status: DC

## 2023-08-08 MED ORDER — FUROSEMIDE 40 MG PO TABS
40.0000 mg | ORAL_TABLET | Freq: Every day | ORAL | Status: DC
  Filled 2023-08-08: qty 1

## 2023-08-08 MED ORDER — ACETAMINOPHEN 325 MG PO TABS
650.0000 mg | ORAL_TABLET | ORAL | Status: DC
  Administered 2023-08-08 – 2023-08-10 (×10): 650 mg via ORAL
  Filled 2023-08-08 (×10): qty 2

## 2023-08-08 MED ORDER — FOSFOMYCIN TROMETHAMINE 3 G PO PACK
3.0000 g | PACK | Freq: Once | ORAL | Status: AC
  Administered 2023-08-08: 3 g via ORAL
  Filled 2023-08-08: qty 3

## 2023-08-08 MED ORDER — SODIUM CHLORIDE 0.9 % IV SOLN: 1.0000 g | INTRAVENOUS | Status: DC

## 2023-08-08 NOTE — Progress Notes (Signed)
 Physical Therapy Treatment Patient Details Name: [name]. MRN: [id number] DOB: [birth year] Today's Date: 08/08/2023   History of Present Illness Pt is a 73 y/o M admitted on 07/24/23 after presenting with c/o SOB. Pt found to have NSTEMI, now s/p L/R [facility] which showed significant LAD stenosis. 6/3 CABG PMH: DM2, a-fib on Eliquis , HTN, HLD, RBBB, OSA on CPAP, anxiety, diverticulitis, neuropathy, Hepatitis, SDH    PT Comments  Pt resting in bed on arrival and agreeable to session with steady progress. Pt with some c/o dizziness with standing mobility, however BP readings stable throughout positional changes, see vitals tab. Pt demonstrating transfers and gait with RW for support with grossly CGA to min A to provide steadying assist. Pt continues to require most assist with bed mobility as pt with difficultly rolling and ascending to sitting from sidelying. Pt with fair adherence to precautions throughout session, benefiting from cues during functional mobility. Patient will benefit from continued inpatient follow up therapy, <3 hours/day. Pt continues to benefit from skilled PT services to progress toward functional mobility goals.     If plan is discharge home, recommend the following: Assistance with cooking/housework;Assist for transportation;Help with stairs or ramp for entrance;A little help with walking and/or transfers;A little help with bathing/dressing/bathroom   Can travel by private vehicle     Yes  Equipment Recommendations  None recommended by PT    Recommendations for Other Services       Precautions / Restrictions Precautions Precautions: Sternal;Fall Precaution Booklet Issued: Yes (comment) Recall of Precautions/Restrictions: Impaired Precaution/Restrictions Comments: cues for hand placement for standing and sitting. Restrictions Weight Bearing Restrictions Per Provider Order: Yes Other Position/Activity Restrictions: Sternal     Mobility  Bed Mobility Overal bed  mobility: Needs Assistance Bed Mobility: Rolling, Sidelying to Sit Rolling: Min assist Sidelying to sit: HOB elevated, Min assist     Sit to sidelying: Mod assist General bed mobility comments: min A to elevate trunk to sitting, mod A to manage LEs into bed and reposition    Transfers Overall transfer level: Needs assistance Equipment used: Rolling walker (2 wheels) Transfers: Sit to/from Stand Sit to Stand: Min assist           General transfer comment: min A from elevated EOB with assist to steady on rise and facilitate anterior weight shift    Ambulation/Gait Ambulation/Gait assistance: Contact guard assist Gait Distance (Feet): 85 Feet Assistive device: Rolling walker (2 wheels) Gait Pattern/deviations: Step-through pattern, Trunk flexed Gait velocity: decreased     General Gait Details: vc for proximity to RW and upright posture (especially when turning)   Stairs             Wheelchair Mobility     Tilt Bed    Modified Rankin (Stroke Patients Only)       Balance Overall balance assessment: Needs assistance Sitting-balance support: Feet supported Sitting balance-Leahy Scale: Good     Standing balance support: No upper extremity supported Standing balance-Leahy Scale: Fair Standing balance comment: reliant on RW                            Communication Communication Communication: No apparent difficulties Factors Affecting Communication: Hearing impaired  Cognition Arousal: Alert Behavior During Therapy: WFL for tasks assessed/performed   PT - Cognitive impairments: No apparent impairments  Following commands: Intact      Cueing Cueing Techniques: Verbal cues  Exercises      General Comments General comments (skin integrity, edema, etc.): VSS on RA, HR 92-100bpm, see vitals tab for BP readings      Pertinent Vitals/Pain Pain Assessment Pain Assessment: Faces Faces Pain Scale: Hurts  little more Pain Location: chest Pain Descriptors / Indicators: Sore Pain Intervention(s): Monitored during session, Limited activity within patient's tolerance, Patient requesting pain meds-RN notified    Home Living                          Prior Function            PT Goals (current goals can now be found in the care plan section) Acute Rehab PT Goals Patient Stated Goal: go for some rehab PT Goal Formulation: With patient Time For Goal Achievement: 08/16/23 Progress towards PT goals: Progressing toward goals    Frequency    Min 2X/week      PT Plan      Co-evaluation              AM-PAC PT 6 Clicks Mobility   Outcome Measure  Help needed turning from your back to your side while in a flat bed without using bedrails?: A Lot Help needed moving from lying on your back to sitting on the side of a flat bed without using bedrails?: Total Help needed moving to and from a bed to a chair (including a wheelchair)?: A Little Help needed standing up from a chair using your arms (e.g., wheelchair or bedside chair)?: A Little Help needed to walk in hospital room?: A Little Help needed climbing 3-5 steps with a railing? : A Little 6 Click Score: 15    End of Session   Activity Tolerance: Patient tolerated treatment well Patient left: with call bell/phone within reach;in bed Nurse Communication: Mobility status;Other (comment);Patient requests pain meds (BP) PT Visit Diagnosis: Muscle weakness (generalized) (M62.81);Difficulty in walking, not elsewhere classified (R26.2);Pain Pain - part of body:  (chest)     Time: 1610-9604 PT Time Calculation (min) (ACUTE ONLY): 27 min  Charges:    $Gait Training: 8-22 mins $Therapeutic Activity: 8-22 mins PT General Charges $$ ACUTE PT VISIT: 1 Visit                     Providence Stivers R. PTA Acute Rehabilitation Services Office: [phone]   Agapito Horseman 08/08/2023, 2:23 PM

## 2023-08-08 NOTE — TOC Progression Note (Addendum)
 Transition of Care (TOC) - Progression Note    Patient Details  Name: [name]. MRN: [id number] Date of Birth: 09/27/1950  Transition of Care [facility]) CM/SW Contact  Juliane Och, LCSW Phone Number: 08/08/2023, 10:57 AM  Clinical Narrative:     10:57 AM Patient informed CSW that he believes his daughter, [name], decided on [facility], but was unsure. CSW sought confirmation from [name]. CSW informed patient and [name] that only SNF in [facility] in network with [employer] is [facility], who are unable to offer a bed at this time, and that patient would need to admit to SNF outside of [facility] if preference is to use [employer].  12:09 PM [name] informed CSW that they have yet to make a SNF bed decision. CSW informed [name] that per progressions, patient is medically ready to discharge pending placement.  3:40 PM CSW spoke with [name] who stated that patient's recent lab results indicated that he is no longer medically ready to discharge to SNF. CSW relayed information to medical team. [name] stated that they had yet to make a SNF decision.  Expected Discharge Plan: Skilled Nursing Facility Barriers to Discharge: English as a second language teacher, Continued Medical Work up, SNF Pending bed offer, Other (must enter comment) (SNF pending bed decision)  Expected Discharge Plan and Services In-house Referral: Clinical Social Work Discharge Planning Services: CM Consult Post Acute Care Choice: Skilled Nursing Facility Living arrangements for the past 2 months: Single Family Home                                       Social Determinants of Health (SDOH) Interventions SDOH Screenings   Food Insecurity: No Food Insecurity (07/24/2023)  Housing: Low Risk  (07/24/2023)  Transportation Needs: No Transportation Needs (07/24/2023)  Utilities: Not At Risk (07/24/2023)  Social Connections: Moderately Integrated (07/24/2023)  Tobacco Use: Medium  Risk (07/30/2023)    Readmission Risk Interventions     No data to display

## 2023-08-08 NOTE — Progress Notes (Signed)
      [address]       [name] [address]             [phone]      Patient with large Leukocytes and many bacteria on UA indicating UTI. Will give Fosfomycin as discussed with pharmacy. Will also get a culture.   Randa Burton, PA-C

## 2023-08-08 NOTE — Plan of Care (Signed)
 Problem: Education: Goal: Ability to describe self-care measures that may prevent or decrease complications (Diabetes Survival Skills Education) will improve Outcome: Progressing Goal: Individualized Educational Video(s) Outcome: Progressing   Problem: Coping: Goal: Ability to adjust to condition or change in health will improve Outcome: Progressing   Problem: Fluid Volume: Goal: Ability to maintain a balanced intake and output will improve Outcome: Progressing   Problem: Health Behavior/Discharge Planning: Goal: Ability to identify and utilize available resources and services will improve Outcome: Progressing Goal: Ability to manage health-related needs will improve Outcome: Progressing   Problem: Metabolic: Goal: Ability to maintain appropriate glucose levels will improve Outcome: Progressing   Problem: Nutritional: Goal: Maintenance of adequate nutrition will improve Outcome: Progressing Goal: Progress toward achieving an optimal weight will improve Outcome: Progressing   Problem: Skin Integrity: Goal: Risk for impaired skin integrity will decrease Outcome: Progressing   Problem: Tissue Perfusion: Goal: Adequacy of tissue perfusion will improve Outcome: Progressing   Problem: Education: Goal: Knowledge of General Education information will improve Description: Including pain rating scale, medication(s)/side effects and non-pharmacologic comfort measures Outcome: Progressing   Problem: Health Behavior/Discharge Planning: Goal: Ability to manage health-related needs will improve Outcome: Progressing   Problem: Clinical Measurements: Goal: Ability to maintain clinical measurements within normal limits will improve Outcome: Progressing Goal: Will remain free from infection Outcome: Progressing Goal: Diagnostic test results will improve Outcome: Progressing Goal: Respiratory complications will improve Outcome: Progressing Goal: Cardiovascular complication will  be avoided Outcome: Progressing   Problem: Activity: Goal: Risk for activity intolerance will decrease Outcome: Progressing   Problem: Nutrition: Goal: Adequate nutrition will be maintained Outcome: Progressing   Problem: Coping: Goal: Level of anxiety will decrease Outcome: Progressing   Problem: Elimination: Goal: Will not experience complications related to bowel motility Outcome: Progressing Goal: Will not experience complications related to urinary retention Outcome: Progressing   Problem: Pain Managment: Goal: General experience of comfort will improve and/or be controlled Outcome: Progressing   Problem: Safety: Goal: Ability to remain free from injury will improve Outcome: Progressing   Problem: Skin Integrity: Goal: Risk for impaired skin integrity will decrease Outcome: Progressing   Problem: Education: Goal: Understanding of CV disease, CV risk reduction, and recovery process will improve Outcome: Progressing Goal: Individualized Educational Video(s) Outcome: Progressing   Problem: Activity: Goal: Ability to return to baseline activity level will improve Outcome: Progressing   Problem: Cardiovascular: Goal: Ability to achieve and maintain adequate cardiovascular perfusion will improve Outcome: Progressing Goal: Vascular access site(s) Level 0-1 will be maintained Outcome: Progressing   Problem: Health Behavior/Discharge Planning: Goal: Ability to safely manage health-related needs after discharge will improve Outcome: Progressing   Problem: Education: Goal: Will demonstrate proper wound care and an understanding of methods to prevent future damage Outcome: Progressing Goal: Knowledge of disease or condition will improve Outcome: Progressing Goal: Knowledge of the prescribed therapeutic regimen will improve Outcome: Progressing Goal: Individualized Educational Video(s) Outcome: Progressing   Problem: Activity: Goal: Risk for activity  intolerance will decrease Outcome: Progressing   Problem: Cardiac: Goal: Will achieve and/or maintain hemodynamic stability Outcome: Progressing   Problem: Clinical Measurements: Goal: Postoperative complications will be avoided or minimized Outcome: Progressing   Problem: Respiratory: Goal: Respiratory status will improve Outcome: Progressing   Problem: Skin Integrity: Goal: Wound healing without signs and symptoms of infection Outcome: Progressing Goal: Risk for impaired skin integrity will decrease Outcome: Progressing   Problem: Urinary Elimination: Goal: Ability to achieve and maintain adequate renal perfusion and functioning will improve Outcome: Progressing

## 2023-08-08 NOTE — Progress Notes (Signed)
 RT educated on CPT flutter valve. Pt and RT practiced x10. Flutter valve left at bedside.

## 2023-08-08 NOTE — Progress Notes (Signed)
 Pt educated on the Importance of mobility and walking in the hall. Pt told walking in the room doesn't count towards his 3 walks in the hall a day. Pt was also asked to eat all 3 meals in the chair daily. Pt agreed to this plan. Will attempt a walk this evening. PA aware

## 2023-08-08 NOTE — Progress Notes (Addendum)
 [address]       [place] [address]             [phone]      9 Days Post-Op Procedure(s) (LRB): CORONARY ARTERY BYPASS GRAFTING (CABG) TIMES THREE USING THE LEFT INTERNAL MAMMARY ARTERY AND ENDOSCOPICALLY HARVESTED RIGHT GREATER SAPHENOUS VEIN (N/A) ECHOCARDIOGRAM, TRANSESOPHAGEAL, INTRAOPERATIVE (N/A) CLIPPING, LEFT ATRIAL APPENDAGE (Left) Subjective: Patient states he feels better this AM. States his breathing feels back to baseline which was difficult prior to surgery due to COPD.   Objective: Vital signs in last 24 hours: Temp:  [97.7 F (36.5 C)-99 F (37.2 C)] 97.8 F (36.6 C) (06/11 0346) Pulse Rate:  [82-95] 89 (06/11 0346) Cardiac Rhythm: Atrial flutter;Bundle branch block (06/10 2041) Resp:  [15-24] 16 (06/10 2300) BP: (96-122)/(64-81) 121/81 (06/11 0346) SpO2:  [92 %-97 %] 95 % (06/10 2300) FiO2 (%):  [32 %] 32 % (06/10 2348) Weight:  [123.7 kg] 123.7 kg (06/11 0500)  Hemodynamic parameters for last 24 hours:    Intake/Output from previous day: 06/10 0701 - 06/11 0700 In: 357 [P.O.:357] Out: 2250 [Urine:2250] Intake/Output this shift: No intake/output data recorded.  General appearance: alert, cooperative, and no distress Neurologic: intact Heart: irregularly irregular rhythm Lungs: diminished bibasilar breath sounds Abdomen: soft, non-tender; bowel sounds normal; no masses,  no organomegaly Extremities: edema 1+ BLE, TED hose Wound: Incisions are clean and dry without sign of infection, ecchymosis of EVH site  Lab Results: Recent Labs    08/06/23 0217  WBC 12.8*  HGB 11.3*  HCT 33.2*  PLT 457*   BMET:  Recent Labs    08/06/23 0217 08/07/23 0253  NA 127* 128*  K 3.7 3.8  CL 82* 83*  CO2 33* 38*  GLUCOSE 117* 89  BUN 24* 26*  CREATININE 0.82 0.86  CALCIUM  8.8* 8.7*    PT/INR: No results for input(s): LABPROT, INR in the last 72 hours. ABG    Component Value Date/Time   PHART 7.330 (L) 07/31/2023 0134    HCO3 24.8 07/31/2023 0134   TCO2 26 07/31/2023 0134   ACIDBASEDEF 1.0 07/31/2023 0134   O2SAT 97 07/31/2023 0134   CBG (last 3)  Recent Labs    08/07/23 1716 08/07/23 2109 08/08/23 0627  GLUCAP 172* 125* 84    Assessment/Plan: S/P Procedure(s) (LRB): CORONARY ARTERY BYPASS GRAFTING (CABG) TIMES THREE USING THE LEFT INTERNAL MAMMARY ARTERY AND ENDOSCOPICALLY HARVESTED RIGHT GREATER SAPHENOUS VEIN (N/A) ECHOCARDIOGRAM, TRANSESOPHAGEAL, INTRAOPERATIVE (N/A) CLIPPING, LEFT ATRIAL APPENDAGE (Left)  Neuro: Patient trouble taking deep breaths due to pain but does not like to take oxycodone . We have discussed proper use of narcotic pain medication as needed.    CV: Hx of PAF on Eliquis  prior to admission. Afib HR 90s this AM, overall controlled rates. On Eliquis  and ASA 81mg . SBP 100s-120s, on Coreg  12.5mg  BID and Entresto 24-26mg  BID.   Pulm: Saturating well on 2L [place], turned oxygen  off and patient was still saturating 96%. Uses CPAP at night. Continue diuresis, mucinex  and inhaler/nebs prn. Encourage IS, flutter valve and ambulation.   GI: +BM yesterday. Tolerating a diet.    Endo: T2DM, preop A1C 6.2. CBGs overall better controlled on Semglee  20U, 2U mealtime novolog  and SSI.    Renal: Cr 0.86, stable. UO 2250cc/24hrs recorded. Pitting edema BLE. Was transitioned to PO Lasix  40mg  BID yesterday. Continue TED hose. Mild hyponatremia possibly due to hypervolemia. K 3.8, Supplement.   ID: Likely reactive leukocytosis, WBC 12.8. Tmax 99. Will  check CBC this AM.    Expected postop ABLA: H/H 11.3/33.2, stable. Not clinically significant at this time.    DVT Prophylaxis: On Eliquis    Deconditioning: Continue work with PT/OT. Plan for SNF at discharge because will not have 24hr support after discharge so not a CIR candidate.    Wound: Bruising of the right EVH tunnel but incisions are clean and dry without sign of infection. Right second toe with some necrosis but patient states he sees  the podiatrist who shaves it down and it looks better than it used to. Reports there used to be more necrotic tissue and podiatrist may have to amputate the toe at some point. Will closely monitor and patient will need close follow up with podiatry.    Dispo: Plan for SNF at discharge, hopefully can d/c to SNF this week.    LOS: 15 days    Randa Burton, PA-C 08/08/2023  Bp low this Am and symptomatic- hold Entresto and Lasix  To SNF when arrangements complete  Landon Pinion C. Luna Salinas, MD [facility] [phone]

## 2023-08-08 NOTE — Plan of Care (Signed)
  Problem: Coping: Goal: Ability to adjust to condition or change in health will improve Outcome: Progressing   Problem: Fluid Volume: Goal: Ability to maintain a balanced intake and output will improve Outcome: Progressing   Problem: Health Behavior/Discharge Planning: Goal: Ability to manage health-related needs will improve Outcome: Progressing   Problem: Metabolic: Goal: Ability to maintain appropriate glucose levels will improve Outcome: Progressing   Problem: Nutritional: Goal: Maintenance of adequate nutrition will improve Outcome: Progressing   Problem: Education: Goal: Knowledge of General Education information will improve Description: Including pain rating scale, medication(s)/side effects and non-pharmacologic comfort measures Outcome: Progressing   Problem: Activity: Goal: Risk for activity intolerance will decrease Outcome: Progressing

## 2023-08-09 ENCOUNTER — Inpatient Hospital Stay (HOSPITAL_COMMUNITY)

## 2023-08-09 LAB — CBC
HCT: 31.5 % — ABNORMAL LOW (ref 39.0–52.0)
Hemoglobin: 10.6 g/dL — ABNORMAL LOW (ref 13.0–17.0)
MCH: 29.1 pg (ref 26.0–34.0)
MCHC: 33.7 g/dL (ref 30.0–36.0)
MCV: 86.5 fL (ref 80.0–100.0)
Platelets: 512 10*3/uL — ABNORMAL HIGH (ref 150–400)
RBC: 3.64 MIL/uL — ABNORMAL LOW (ref 4.22–5.81)
RDW: 13.5 % (ref 11.5–15.5)
WBC: 15.7 10*3/uL — ABNORMAL HIGH (ref 4.0–10.5)
nRBC: 0 % (ref 0.0–0.2)

## 2023-08-09 LAB — BASIC METABOLIC PANEL WITH GFR
Anion gap: 10 (ref 5–15)
BUN: 28 mg/dL — ABNORMAL HIGH (ref 8–23)
CO2: 33 mmol/L — ABNORMAL HIGH (ref 22–32)
Calcium: 8.4 mg/dL — ABNORMAL LOW (ref 8.9–10.3)
Chloride: 81 mmol/L — ABNORMAL LOW (ref 98–111)
Creatinine, Ser: 0.89 mg/dL (ref 0.61–1.24)
GFR, Estimated: >60 mL/min (ref >60)
Glucose, Bld: 100 mg/dL — ABNORMAL HIGH (ref 70–99)
Potassium: 4.2 mmol/L (ref 3.5–5.1)
Sodium: 124 mmol/L — ABNORMAL LOW (ref 135–145)

## 2023-08-09 LAB — GLUCOSE, CAPILLARY
Glucose-Capillary: 101 mg/dL — ABNORMAL HIGH (ref 70–99)
Glucose-Capillary: 152 mg/dL — ABNORMAL HIGH (ref 70–99)
Glucose-Capillary: 96 mg/dL (ref 70–99)
Glucose-Capillary: 198 mg/dL — ABNORMAL HIGH (ref 70–99)

## 2023-08-09 NOTE — Progress Notes (Signed)
 Physical Therapy Treatment Patient Details Name: [name]. MRN: [id number] DOB: 1950-05-12 Today's Date: 08/09/2023   History of Present Illness Pt is a 73 y/o M admitted on 07/24/23 after presenting with c/o SOB. Pt found to have NSTEMI, now s/p L/R [facility] which showed significant LAD stenosis. 6/3 CABG PMH: DM2, a-fib on Eliquis , HTN, HLD, RBBB, OSA on CPAP, anxiety, diverticulitis, neuropathy, Hepatitis, SDH   PT Comments  Pt in bed upon arrival and eager for PT session. Focused today's session on bed mobility and STS transfers. Pt continues to have increased difficulty adhering to sternal precautions with cues throughout session for adherence. Practiced sidelying<>sit transfer with pt needing ModA to MinA for trunk elevation. Increased pain in sternum with bed mobility. Attempted to stand from lowest bed height with pt unable to complete stand. Required MinA from elevated bed height for boost-up with use of momentum. Continue to recommend <3hrs post acute rehab to work towards independence with mobility and decrease caregiver burden. Acute PT to follow.     If plan is discharge home, recommend the following: Assistance with cooking/housework;Assist for transportation;Help with stairs or ramp for entrance;A little help with walking and/or transfers;A little help with bathing/dressing/bathroom   Can travel by private vehicle     Yes  Equipment Recommendations  None recommended by PT       Precautions / Restrictions Precautions Precautions: Sternal;Fall Precaution Booklet Issued: Yes (comment) Recall of Precautions/Restrictions: Impaired Precaution/Restrictions Comments: cues for hand placement for standing and sitting. Able to recall move in the tube, however, needed cues for adherance Restrictions Other Position/Activity Restrictions: sternal     Mobility  Bed Mobility Overal bed mobility: Needs Assistance Bed Mobility: Rolling, Sidelying to Sit, Sit to Sidelying Rolling: Min  assist Sidelying to sit: Mod assist, Min assist, HOB elevated    Sit to sidelying: Mod assist General bed mobility comments: able to move LE's off EOB, MinA to roll and ModA/MinA for sidelying/sit for trunk elevation. ModA for return to sidelying for LE management    Transfers Overall transfer level: Needs assistance Equipment used: Rolling walker (2 wheels) Transfers: Sit to/from Stand, Bed to chair/wheelchair/BSC Sit to Stand: Mod assist, From elevated surface, Min assist   Step pivot transfers: Contact guard assist     General transfer comment: Attempted multiple stands from low bed height w/ pt unable to maintain stand. MinA for boost-up from elevated surface with hands on knees and momentum.    Ambulation/Gait Ambulation/Gait assistance: Contact guard assist Gait Distance (Feet): 120 Feet Assistive device: Rolling walker (2 wheels) Gait Pattern/deviations: Step-through pattern, Trunk flexed Gait velocity: decreased     General Gait Details: no overt LOB with cues for proximity to RW   Balance Overall balance assessment: Needs assistance Sitting-balance support: Feet supported Sitting balance-Leahy Scale: Good     Standing balance support: No upper extremity supported Standing balance-Leahy Scale: Poor Standing balance comment: reliant on RW         Communication Communication Communication: No apparent difficulties Factors Affecting Communication: Hearing impaired  Cognition Arousal: Alert Behavior During Therapy: WFL for tasks assessed/performed   PT - Cognitive impairments: No apparent impairments    Following commands: Intact      Cueing Cueing Techniques: Verbal cues  Exercises Other Exercises Other Exercises: x4 reps of trunk sidelying<>sit transfer Other Exercises: x5 STS from elevated height, MinA        Pertinent Vitals/Pain Pain Assessment Pain Assessment: Faces Faces Pain Scale: Hurts even more Pain Location: sternum incision Pain  Descriptors / Indicators: Discomfort, Grimacing Pain Intervention(s): Limited activity within patient's tolerance, Monitored during session, Repositioned     PT Goals (current goals can now be found in the care plan section) Acute Rehab PT Goals Patient Stated Goal: gain independence with moving PT Goal Formulation: With patient Time For Goal Achievement: 08/16/23 Potential to Achieve Goals: Good Progress towards PT goals: Progressing toward goals    Frequency    Min 2X/week       AM-PAC PT 6 Clicks Mobility   Outcome Measure  Help needed turning from your back to your side while in a flat bed without using bedrails?: A Lot Help needed moving from lying on your back to sitting on the side of a flat bed without using bedrails?: A Lot Help needed moving to and from a bed to a chair (including a wheelchair)?: A Lot Help needed standing up from a chair using your arms (e.g., wheelchair or bedside chair)?: A Lot Help needed to walk in hospital room?: A Little Help needed climbing 3-5 steps with a railing? : A Little 6 Click Score: 14    End of Session   Activity Tolerance: Patient tolerated treatment well Patient left: in bed;with call bell/phone within reach Nurse Communication: Mobility status PT Visit Diagnosis: Muscle weakness (generalized) (M62.81);Difficulty in walking, not elsewhere classified (R26.2);Pain Pain - part of body:  (chest)     Time: 1430-1500 PT Time Calculation (min) (ACUTE ONLY): 30 min  Charges:    $Gait Training: 8-22 mins $Therapeutic Activity: 8-22 mins PT General Charges $$ ACUTE PT VISIT: 1 Visit                     Orysia Blas, PT, DPT Secure Chat Preferred  Rehab Office [phone]    [name] 08/09/2023, 3:08 PM

## 2023-08-09 NOTE — Plan of Care (Signed)
   Problem: Fluid Volume: Goal: Ability to maintain a balanced intake and output will improve Outcome: Progressing   Problem: Metabolic: Goal: Ability to maintain appropriate glucose levels will improve Outcome: Progressing   Problem: Nutritional: Goal: Maintenance of adequate nutrition will improve Outcome: Progressing   Problem: Skin Integrity: Goal: Risk for impaired skin integrity will decrease Outcome: Progressing

## 2023-08-09 NOTE — Progress Notes (Addendum)
 [address]       [place] [address]             [phone]      10 Days Post-Op Procedure(s) (LRB): CORONARY ARTERY BYPASS GRAFTING (CABG) TIMES THREE USING THE LEFT INTERNAL MAMMARY ARTERY AND ENDOSCOPICALLY HARVESTED RIGHT GREATER SAPHENOUS VEIN (N/A) ECHOCARDIOGRAM, TRANSESOPHAGEAL, INTRAOPERATIVE (N/A) CLIPPING, LEFT ATRIAL APPENDAGE (Left) Subjective: Patient states he feels better but wants to be able to independently walk to the bathroom before he is discharged to SNF  Objective: Vital signs in last 24 hours: Temp:  [97.6 F (36.4 C)-98.7 F (37.1 C)] 97.7 F (36.5 C) (06/12 0300) Pulse Rate:  [69-87] 73 (06/12 0300) Cardiac Rhythm: Normal sinus rhythm (06/11 2000) Resp:  [16-20] 20 (06/12 0300) BP: (91-128)/(52-84) 128/54 (06/12 0300) SpO2:  [92 %-99 %] 94 % (06/12 0300) Weight:  [123.2 kg] 123.2 kg (06/12 0500)  Hemodynamic parameters for last 24 hours:    Intake/Output from previous day: 06/11 0701 - 06/12 0700 In: 480 [P.O.:480] Out: 350 [Urine:350] Intake/Output this shift: No intake/output data recorded.  General appearance: alert, cooperative, and no distress Neurologic: intact Heart: regular rate and rhythm, S1, S2 normal, no murmur, click, rub or gallop Lungs: slightly diminished bibasilar breath sounds Abdomen: soft, non-tender; bowel sounds normal; no masses,  no organomegaly and protuberant abdomen Extremities: edema 1+ BLE, TED hose in place Wound: Clean and dry without sign of infection, ecchymosis of EVH site improved  Lab Results: Recent Labs    08/08/23 0742 08/09/23 0231  WBC 15.4* 15.7*  HGB 11.8* 10.6*  HCT 35.0* 31.5*  PLT 633* 512*   BMET:  Recent Labs    08/07/23 0253 08/09/23 0231  NA 128* 124*  K 3.8 4.2  CL 83* 81*  CO2 38* 33*  GLUCOSE 89 100*  BUN 26* 28*  CREATININE 0.86 0.89  CALCIUM  8.7* 8.4*    PT/INR: No results for input(s): LABPROT, INR in the last 72 hours. ABG     Component Value Date/Time   PHART 7.330 (L) 07/31/2023 0134   HCO3 24.8 07/31/2023 0134   TCO2 26 07/31/2023 0134   ACIDBASEDEF 1.0 07/31/2023 0134   O2SAT 97 07/31/2023 0134   CBG (last 3)  Recent Labs    08/08/23 1617 08/08/23 2111 08/09/23 0609  GLUCAP 153* 153* 101*    Assessment/Plan: S/P Procedure(s) (LRB): CORONARY ARTERY BYPASS GRAFTING (CABG) TIMES THREE USING THE LEFT INTERNAL MAMMARY ARTERY AND ENDOSCOPICALLY HARVESTED RIGHT GREATER SAPHENOUS VEIN (N/A) ECHOCARDIOGRAM, TRANSESOPHAGEAL, INTRAOPERATIVE (N/A) CLIPPING, LEFT ATRIAL APPENDAGE (Left)  Neuro: Patient trouble taking deep breaths due to pain but does not like to take oxycodone . We have discussed proper use of narcotic pain medication as needed.    CV: Hx of PAF on Eliquis  prior to admission. NSR, HR 60s this AM. On Eliquis  and ASA 81mg . Hypotension improved, SBP 128 this AM on Coreg  12.5mg  BID. Entresto discontinued due to hypotension.    Pulm: Saturating well on RA. Uses CPAP at night. Complains of difficulty taking deep breaths and breathing while lying down but he reports this is the same as his baseline prior to surgery. Continue mucinex  and inhaler/nebs prn. Encourage IS, flutter valve and ambulation.   GI: +BM yesterday. Tolerating a diet.    Endo: T2DM, preop A1C 6.2. CBGs well controlled on Semglee  20U, 2U mealtime novolog  and SSI.    Renal: Cr 0.89, stable. UO 350cc/24hrs recorded. Weights are not accurate. Edema BLE improved. Continue TED hose.  Moderate hyponatremia, possibly due to diuresis. Will hold diuresis and restrict free water. K 4.2, at goal. UTI on UA yesterday, was given Fosfomycin as recommended by pharmacy. Will get urine culture.    ID: Leukocytosis 15.7, stable. Likely due to UTI. Tmax 98.7.    Expected postop ABLA: H/H 10.6/31.5, stable. Not clinically significant at this time.    DVT Prophylaxis: On Eliquis    Deconditioning: Patient only ambulated in the hall once yesterday, we  discussed goal of ambulating in the hall 3 times. Patient reports his goal is to use the toilet on his own prior to discharge, we reviewed that this is a good goal but if it is not met it will not keep him from being discharged to SNF. Continue work with PT/OT. Plan for SNF at discharge because will not have 24hr support after discharge so not a CIR candidate.    Wound: Bruising of the right EVH tunnel but incisions are clean and dry without sign of infection. Right second toe with some necrosis but patient states he sees the podiatrist who shaves it down and it looks better than it used to. Reports there used to be more necrotic tissue and podiatrist may have to amputate the toe at some point. Will closely monitor and patient will need close follow up with podiatry.    Dispo: Plan for SNF at discharge, daughters have not chosen SNF yet. Hopefully will choose today and we can work on English as a second language teacher. Patient is medically ready for d/c   LOS: 16 days    Randa Burton, PA-C 08/09/2023 Patient seen and examined, agree with above SNF planning underway  Landon Pinion C. Luna Salinas, MD [facility] [phone]

## 2023-08-09 NOTE — TOC Progression Note (Signed)
 Transition of Care (TOC) - Progression Note    Patient Details  Name: [name]. MRN: [id number] Date of Birth: 09/14/1950  [facility]) CM/SW Contact  Juliane Och, LCSW Phone Number: 08/09/2023, 4:36 PM  Clinical Narrative:     4:36 PM Patient's daughter, [name], informed CSW of SNF bed decision ([facility]). CSW submitted insurance authorization which was automatically approved (ID [id number]) and is valid 08/10/2023-08/14/2023.  Expected Discharge Plan: Skilled Nursing Facility Barriers to Discharge: English as a second language teacher, Continued Medical Work up, SNF Pending bed offer, Other (must enter comment) (SNF pending bed decision)  Expected Discharge Plan and Services In-house Referral: Clinical Social Work Discharge Planning Services: CM Consult Post Acute Care Choice: Skilled Nursing Facility Living arrangements for the past 2 months: Single Family Home                                       Social Determinants of Health (SDOH) Interventions SDOH Screenings   Food Insecurity: No Food Insecurity (07/24/2023)  Housing: Low Risk  (07/24/2023)  Transportation Needs: No Transportation Needs (07/24/2023)  Utilities: Not At Risk (07/24/2023)  Social Connections: Moderately Integrated (07/24/2023)  Tobacco Use: Medium Risk (07/30/2023)    Readmission Risk Interventions     No data to display

## 2023-08-09 NOTE — Progress Notes (Signed)
 Mobility Specialist Progress Note:    08/09/23 1000  Oxygen  Therapy  O2 Device Room Air  Mobility  Activity Ambulated with assistance in hallway  Level of Assistance Contact guard assist, steadying assist  Assistive Device Front wheel walker  Distance Ambulated (ft) 85 ft  RUE Weight Bearing Per Provider Order NWB  LUE Weight Bearing Per Provider Order NWB  Activity Response Tolerated well  Mobility Referral Yes  Mobility visit 1 Mobility  Mobility Specialist Start Time (ACUTE ONLY) 0945  Mobility Specialist Stop Time (ACUTE ONLY) 0956  Mobility Specialist Time Calculation (min) (ACUTE ONLY) 11 min   Pt received in bed and agreeable. Required modA for bed mobility and cues for sternal precautions. Asymptomatic w/ no complaints throughout ambulation. Pt left on BSC with call bell in hand. RN aware.  D'Vante Nolon Baxter Mobility Specialist Please contact via Special educational needs teacher or Rehab office at [phone]

## 2023-08-10 DIAGNOSIS — I739 Peripheral vascular disease, unspecified: Secondary | ICD-10-CM | POA: Diagnosis not present

## 2023-08-10 DIAGNOSIS — R5383 Other fatigue: Secondary | ICD-10-CM | POA: Diagnosis not present

## 2023-08-10 DIAGNOSIS — I959 Hypotension, unspecified: Secondary | ICD-10-CM | POA: Diagnosis not present

## 2023-08-10 DIAGNOSIS — I509 Heart failure, unspecified: Secondary | ICD-10-CM | POA: Diagnosis not present

## 2023-08-10 DIAGNOSIS — I5043 Acute on chronic combined systolic (congestive) and diastolic (congestive) heart failure: Secondary | ICD-10-CM | POA: Diagnosis present

## 2023-08-10 DIAGNOSIS — Z6834 Body mass index (BMI) 34.0-34.9, adult: Secondary | ICD-10-CM | POA: Diagnosis not present

## 2023-08-10 DIAGNOSIS — R197 Diarrhea, unspecified: Secondary | ICD-10-CM | POA: Diagnosis not present

## 2023-08-10 DIAGNOSIS — E785 Hyperlipidemia, unspecified: Secondary | ICD-10-CM | POA: Diagnosis present

## 2023-08-10 DIAGNOSIS — J449 Chronic obstructive pulmonary disease, unspecified: Secondary | ICD-10-CM | POA: Diagnosis present

## 2023-08-10 DIAGNOSIS — I2510 Atherosclerotic heart disease of native coronary artery without angina pectoris: Secondary | ICD-10-CM | POA: Diagnosis not present

## 2023-08-10 DIAGNOSIS — S8011XA Contusion of right lower leg, initial encounter: Secondary | ICD-10-CM | POA: Diagnosis present

## 2023-08-10 DIAGNOSIS — K219 Gastro-esophageal reflux disease without esophagitis: Secondary | ICD-10-CM | POA: Diagnosis not present

## 2023-08-10 DIAGNOSIS — I5033 Acute on chronic diastolic (congestive) heart failure: Secondary | ICD-10-CM | POA: Diagnosis not present

## 2023-08-10 DIAGNOSIS — I483 Typical atrial flutter: Secondary | ICD-10-CM | POA: Diagnosis not present

## 2023-08-10 DIAGNOSIS — Z7401 Bed confinement status: Secondary | ICD-10-CM | POA: Diagnosis not present

## 2023-08-10 DIAGNOSIS — J9811 Atelectasis: Secondary | ICD-10-CM | POA: Diagnosis present

## 2023-08-10 DIAGNOSIS — G4733 Obstructive sleep apnea (adult) (pediatric): Secondary | ICD-10-CM | POA: Diagnosis not present

## 2023-08-10 DIAGNOSIS — E1151 Type 2 diabetes mellitus with diabetic peripheral angiopathy without gangrene: Secondary | ICD-10-CM | POA: Diagnosis present

## 2023-08-10 DIAGNOSIS — R600 Localized edema: Secondary | ICD-10-CM | POA: Diagnosis not present

## 2023-08-10 DIAGNOSIS — I340 Nonrheumatic mitral (valve) insufficiency: Secondary | ICD-10-CM | POA: Diagnosis not present

## 2023-08-10 DIAGNOSIS — G4730 Sleep apnea, unspecified: Secondary | ICD-10-CM | POA: Diagnosis not present

## 2023-08-10 DIAGNOSIS — R1013 Epigastric pain: Secondary | ICD-10-CM | POA: Diagnosis not present

## 2023-08-10 DIAGNOSIS — E876 Hypokalemia: Secondary | ICD-10-CM | POA: Diagnosis present

## 2023-08-10 DIAGNOSIS — R531 Weakness: Secondary | ICD-10-CM | POA: Diagnosis not present

## 2023-08-10 DIAGNOSIS — E1165 Type 2 diabetes mellitus with hyperglycemia: Secondary | ICD-10-CM | POA: Diagnosis not present

## 2023-08-10 DIAGNOSIS — R069 Unspecified abnormalities of breathing: Secondary | ICD-10-CM | POA: Diagnosis not present

## 2023-08-10 DIAGNOSIS — I10 Essential (primary) hypertension: Secondary | ICD-10-CM | POA: Diagnosis not present

## 2023-08-10 DIAGNOSIS — I499 Cardiac arrhythmia, unspecified: Secondary | ICD-10-CM | POA: Diagnosis not present

## 2023-08-10 DIAGNOSIS — J810 Acute pulmonary edema: Secondary | ICD-10-CM | POA: Diagnosis not present

## 2023-08-10 DIAGNOSIS — R0609 Other forms of dyspnea: Secondary | ICD-10-CM | POA: Diagnosis not present

## 2023-08-10 DIAGNOSIS — Z7901 Long term (current) use of anticoagulants: Secondary | ICD-10-CM | POA: Diagnosis not present

## 2023-08-10 DIAGNOSIS — E1140 Type 2 diabetes mellitus with diabetic neuropathy, unspecified: Secondary | ICD-10-CM | POA: Diagnosis present

## 2023-08-10 DIAGNOSIS — Z951 Presence of aortocoronary bypass graft: Secondary | ICD-10-CM | POA: Diagnosis not present

## 2023-08-10 DIAGNOSIS — F411 Generalized anxiety disorder: Secondary | ICD-10-CM | POA: Diagnosis present

## 2023-08-10 DIAGNOSIS — Z9989 Dependence on other enabling machines and devices: Secondary | ICD-10-CM | POA: Diagnosis not present

## 2023-08-10 DIAGNOSIS — J309 Allergic rhinitis, unspecified: Secondary | ICD-10-CM | POA: Diagnosis not present

## 2023-08-10 DIAGNOSIS — E871 Hypo-osmolality and hyponatremia: Secondary | ICD-10-CM | POA: Diagnosis present

## 2023-08-10 DIAGNOSIS — I480 Paroxysmal atrial fibrillation: Secondary | ICD-10-CM | POA: Diagnosis present

## 2023-08-10 DIAGNOSIS — I214 Non-ST elevation (NSTEMI) myocardial infarction: Secondary | ICD-10-CM | POA: Diagnosis present

## 2023-08-10 DIAGNOSIS — I110 Hypertensive heart disease with heart failure: Secondary | ICD-10-CM | POA: Diagnosis not present

## 2023-08-10 DIAGNOSIS — R0602 Shortness of breath: Secondary | ICD-10-CM | POA: Diagnosis present

## 2023-08-10 DIAGNOSIS — I081 Rheumatic disorders of both mitral and tricuspid valves: Secondary | ICD-10-CM | POA: Diagnosis present

## 2023-08-10 DIAGNOSIS — E1169 Type 2 diabetes mellitus with other specified complication: Secondary | ICD-10-CM | POA: Diagnosis not present

## 2023-08-10 DIAGNOSIS — Z794 Long term (current) use of insulin: Secondary | ICD-10-CM | POA: Diagnosis not present

## 2023-08-10 DIAGNOSIS — D62 Acute posthemorrhagic anemia: Secondary | ICD-10-CM | POA: Diagnosis not present

## 2023-08-10 DIAGNOSIS — I130 Hypertensive heart and chronic kidney disease with heart failure and stage 1 through stage 4 chronic kidney disease, or unspecified chronic kidney disease: Secondary | ICD-10-CM | POA: Diagnosis present

## 2023-08-10 DIAGNOSIS — I4892 Unspecified atrial flutter: Secondary | ICD-10-CM | POA: Diagnosis present

## 2023-08-10 DIAGNOSIS — Y92239 Unspecified place in hospital as the place of occurrence of the external cause: Secondary | ICD-10-CM | POA: Diagnosis not present

## 2023-08-10 DIAGNOSIS — S2231XA Fracture of one rib, right side, initial encounter for closed fracture: Secondary | ICD-10-CM | POA: Diagnosis present

## 2023-08-10 DIAGNOSIS — I4891 Unspecified atrial fibrillation: Secondary | ICD-10-CM | POA: Diagnosis not present

## 2023-08-10 DIAGNOSIS — R609 Edema, unspecified: Secondary | ICD-10-CM | POA: Diagnosis not present

## 2023-08-10 DIAGNOSIS — H903 Sensorineural hearing loss, bilateral: Secondary | ICD-10-CM | POA: Diagnosis not present

## 2023-08-10 DIAGNOSIS — I4819 Other persistent atrial fibrillation: Secondary | ICD-10-CM | POA: Diagnosis not present

## 2023-08-10 DIAGNOSIS — E1122 Type 2 diabetes mellitus with diabetic chronic kidney disease: Secondary | ICD-10-CM | POA: Diagnosis present

## 2023-08-10 LAB — BASIC METABOLIC PANEL WITH GFR
Anion gap: 9 (ref 5–15)
BUN: 23 mg/dL (ref 8–23)
CO2: 33 mmol/L — ABNORMAL HIGH (ref 22–32)
Calcium: 8.4 mg/dL — ABNORMAL LOW (ref 8.9–10.3)
Chloride: 83 mmol/L — ABNORMAL LOW (ref 98–111)
Creatinine, Ser: 0.86 mg/dL (ref 0.61–1.24)
GFR, Estimated: >60 mL/min (ref >60)
Glucose, Bld: 124 mg/dL — ABNORMAL HIGH (ref 70–99)
Potassium: 4 mmol/L (ref 3.5–5.1)
Sodium: 125 mmol/L — ABNORMAL LOW (ref 135–145)

## 2023-08-10 LAB — GLUCOSE, CAPILLARY
Glucose-Capillary: 106 mg/dL — ABNORMAL HIGH (ref 70–99)
Glucose-Capillary: 144 mg/dL — ABNORMAL HIGH (ref 70–99)

## 2023-08-10 MED ORDER — FUROSEMIDE 20 MG PO TABS: 20.0000 mg | ORAL_TABLET | Freq: Every day | ORAL | Status: DC

## 2023-08-10 MED ORDER — CARVEDILOL 12.5 MG PO TABS: 12.5000 mg | ORAL_TABLET | Freq: Two times a day (BID) | ORAL | Status: AC

## 2023-08-10 MED ORDER — ASPIRIN 81 MG PO TBEC: 81.0000 mg | DELAYED_RELEASE_TABLET | Freq: Every day | ORAL | Status: AC

## 2023-08-10 MED ORDER — ACETAMINOPHEN 325 MG PO TABS: 650.0000 mg | ORAL_TABLET | Freq: Four times a day (QID) | ORAL | Status: AC | PRN

## 2023-08-10 MED ORDER — OXYCODONE HCL 5 MG PO TABS: 5.0000 mg | ORAL_TABLET | Freq: Four times a day (QID) | ORAL | 0 refills | Status: DC | PRN

## 2023-08-10 NOTE — Progress Notes (Signed)
 Printed out AVS and handed the packet including prescription scripts to [facility], gathered patient belongings, removed Ivs and patient was wheeled out by [employer]

## 2023-08-10 NOTE — Progress Notes (Signed)
   08/10/23 0033  BiPAP/CPAP/SIPAP  $ Non-Invasive Ventilator  Non-Invasive Vent Subsequent  BiPAP/CPAP/SIPAP Pt Type Adult  BiPAP/CPAP/SIPAP DREAMSTATIOND  Mask Type Nasal pillows  Mask Size Large  IPAP 8 cmH20  Flow Rate 3 lpm  Patient Home Machine No  Safety Check Completed by RT for Home Unit Yes, no issues noted  Patient Home Mask Yes  Patient Home Tubing Yes  Auto Titrate Yes  Device Plugged into RED Power Outlet Yes  BiPAP/CPAP /SiPAP Vitals  Pulse Rate 88  Resp 18  SpO2 96 %  Bilateral Breath Sounds Clear;Diminished  MEWS Score/Color  MEWS Score 0  MEWS Score Color Marrie Sizer

## 2023-08-10 NOTE — Progress Notes (Addendum)
 [address]       [place] [address]             [phone]      11 Days Post-Op Procedure(s) (LRB): CORONARY ARTERY BYPASS GRAFTING (CABG) TIMES THREE USING THE LEFT INTERNAL MAMMARY ARTERY AND ENDOSCOPICALLY HARVESTED RIGHT GREATER SAPHENOUS VEIN (N/A) ECHOCARDIOGRAM, TRANSESOPHAGEAL, INTRAOPERATIVE (N/A) CLIPPING, LEFT ATRIAL APPENDAGE (Left) Subjective: Patient sitting in chair eating breakfast, reports he feels better this AM  Objective: Vital signs in last 24 hours: Temp:  [97.6 F (36.4 C)-98.1 F (36.7 C)] 97.6 F (36.4 C) (06/13 0330) Pulse Rate:  [68-88] 82 (06/13 0330) Cardiac Rhythm: Atrial flutter;Bundle branch block (06/12 1900) Resp:  [15-22] 15 (06/13 0330) BP: (111-140)/(66-121) 131/66 (06/13 0330) SpO2:  [94 %-99 %] 98 % (06/13 0330) Weight:  [122.9 kg] 122.9 kg (06/13 0700)  Hemodynamic parameters for last 24 hours:    Intake/Output from previous day: 06/12 0701 - 06/13 0700 In: 600 [P.O.:600] Out: 2150 [Urine:2150] Intake/Output this shift: No intake/output data recorded.  General appearance: alert, cooperative, and no distress Neurologic: intact Heart: irregularly irregular rhythm Lungs: diminished lung sounds bibasilar improved Abdomen: soft, non-tender; bowel sounds normal; no masses,  no organomegaly and protuberant Extremities: edema 1+ BLE Wound: Clean and dry without sign of infection, stable ecchymosis of EVH site  Lab Results: Recent Labs    08/08/23 0742 08/09/23 0231  WBC 15.4* 15.7*  HGB 11.8* 10.6*  HCT 35.0* 31.5*  PLT 633* 512*   BMET:  Recent Labs    08/09/23 0231 08/10/23 0309  NA 124* 125*  K 4.2 4.0  CL 81* 83*  CO2 33* 33*  GLUCOSE 100* 124*  BUN 28* 23  CREATININE 0.89 0.86  CALCIUM  8.4* 8.4*    PT/INR: No results for input(s): LABPROT, INR in the last 72 hours. ABG    Component Value Date/Time   PHART 7.330 (L) 07/31/2023 0134   HCO3 24.8 07/31/2023 0134   TCO2 26  07/31/2023 0134   ACIDBASEDEF 1.0 07/31/2023 0134   O2SAT 97 07/31/2023 0134   CBG (last 3)  Recent Labs    08/09/23 1625 08/09/23 2109 08/10/23 0630  GLUCAP 96 198* 106*    Assessment/Plan: S/P Procedure(s) (LRB): CORONARY ARTERY BYPASS GRAFTING (CABG) TIMES THREE USING THE LEFT INTERNAL MAMMARY ARTERY AND ENDOSCOPICALLY HARVESTED RIGHT GREATER SAPHENOUS VEIN (N/A) ECHOCARDIOGRAM, TRANSESOPHAGEAL, INTRAOPERATIVE (N/A) CLIPPING, LEFT ATRIAL APPENDAGE (Left)  Neuro: Pain controlled this AM   CV: Hx of PAF on Eliquis  prior to admission. Afib/aflutter with controlled rate, HR 70s this AM. On Eliquis  and ASA 81mg . Hypotension improved, SBP 119-140 this AM on Coreg  12.5mg  BID. Entresto  discontinued due to hypotension, hopefully can restart as an outpatient.   Pulm: Patient using 2L [place] this AM but no pulse ox is on, has been saturating 97% on RA but states he still has difficulty taking deep breaths which is his baseline prior to surgery and he does not use O2 at home. Took him off oxygen  and tolerated fine. Uses CPAP at night. Continue mucinex  and inhaler/nebs prn. Encourage IS, flutter valve and ambulation.   GI: +BM, tolerating a diet.    Endo: T2DM, preop A1C 6.2. CBGs well controlled on Semglee  20U, 2U mealtime novolog  and SSI.    Renal: Cr 0.89, stable. UO 2150cc/24hrs recorded. Weights are not accurate. Edema BLE improved. Continue TED hose. Acute on chronic moderate hyponatremia, improved after holding diuresis. Continue to hold diuresis and restrict free water, will restart  home Lasix  in a few days. K 4.0, at goal. UTI, urine culture pending will follow. Was given Fosfomycin as recommended by pharmacy.   ID: Leukocytosis 15.7 yesterday, stable. Likely due to UTI. Tmax 98.1.    DVT Prophylaxis: On Eliquis    Deconditioning: Patient ambulated in the hall better yesterday. Continue work with PT/OT. Plan for SNF at discharge because will not have 24hr support after discharge so not a  CIR candidate.    Wound: Bruising of the right EVH tunnel but incisions are clean and dry without sign of infection. Right second toe with some necrosis but patient states he sees the podiatrist who shaves it down and it looks better than it used to. Reports there used to be more necrotic tissue and podiatrist may have to amputate the toe at some point. Will closely monitor and patient will need close follow up with podiatry.    Dispo: Plan to d/c to SNF today   LOS: 17 days    Angela Barban, PA-C 08/10/2023

## 2023-08-10 NOTE — TOC Transition Note (Signed)
 Transition of Care [facility]) - Discharge Note   Patient Details  Name: [name]. MRN: [id number] Date of Birth: [birth year]  Transition [facility]) CM/SW Contact:  Juliane Och, LCSW Phone Number: 08/10/2023, 9:59 AM   Clinical Narrative:     Patient will DC to: [facility] Anticipated DC date: 08/10/2023 Family notified: Ginette Lah; Daughter; [phone] Transport by: Lyna Sandhoff   Per MD patient ready for DC to [facility]. RN to call report prior to discharge [phone]). RN, patient, patient's family, and facility notified of DC. Discharge Summary and FL2 sent to facility. DC packet on chart. Ambulance transport requested for patient at 9:57.   CSW will sign off for now as social work intervention is no longer needed. Please consult us  again if new needs arise.    Final next level of care: Skilled Nursing Facility Barriers to Discharge: Barriers Resolved   Patient Goals and CMS Choice Patient states their goals for this hospitalization and ongoing recovery are:: SNF CMS Medicare.gov Compare Post Acute Care list provided to:: Patient Choice offered to / list presented to : Patient      Discharge Placement              Patient chooses bed at: [facility] Patient to be transferred to facility by: [facility] Name of family member notified: Hermon Lor; Daughter; [phone] Patient and family notified of of transfer: 08/10/23  Discharge Plan and Services Additional resources added to the After Visit Summary for   In-house Referral: Clinical Social Work Discharge Planning Services: CM Consult Post Acute Care Choice: Skilled Nursing Facility                               Social Drivers of Health (SDOH) Interventions SDOH Screenings   Food Insecurity: No Food Insecurity (07/24/2023)  Housing: Low Risk  (07/24/2023)  Transportation Needs: No Transportation Needs (07/24/2023)  Utilities: Not At Risk (07/24/2023)  Social  Connections: Moderately Integrated (07/24/2023)  Tobacco Use: Medium Risk (07/30/2023)     Readmission Risk Interventions     No data to display

## 2023-08-11 DIAGNOSIS — G4733 Obstructive sleep apnea (adult) (pediatric): Secondary | ICD-10-CM | POA: Diagnosis not present

## 2023-08-11 DIAGNOSIS — E1151 Type 2 diabetes mellitus with diabetic peripheral angiopathy without gangrene: Secondary | ICD-10-CM | POA: Diagnosis not present

## 2023-08-11 DIAGNOSIS — E785 Hyperlipidemia, unspecified: Secondary | ICD-10-CM | POA: Diagnosis not present

## 2023-08-14 ENCOUNTER — Inpatient Hospital Stay (HOSPITAL_COMMUNITY)
Admission: EM | Admit: 2023-08-14 | Discharge: 2023-08-27 | DRG: 280 | Disposition: A | Discharge status: Skilled Nursing Facility | Source: Skilled Nursing Facility | Attending: Internal Medicine | Admitting: Internal Medicine

## 2023-08-14 ENCOUNTER — Encounter (HOSPITAL_COMMUNITY): Payer: Self-pay | Admitting: Emergency Medicine

## 2023-08-14 ENCOUNTER — Other Ambulatory Visit: Payer: Self-pay

## 2023-08-14 ENCOUNTER — Emergency Department (HOSPITAL_COMMUNITY)

## 2023-08-14 DIAGNOSIS — I959 Hypotension, unspecified: Secondary | ICD-10-CM | POA: Diagnosis not present

## 2023-08-14 DIAGNOSIS — Z7982 Long term (current) use of aspirin: Secondary | ICD-10-CM

## 2023-08-14 DIAGNOSIS — Z7984 Long term (current) use of oral hypoglycemic drugs: Secondary | ICD-10-CM

## 2023-08-14 DIAGNOSIS — S8011XA Contusion of right lower leg, initial encounter: Secondary | ICD-10-CM | POA: Diagnosis present

## 2023-08-14 DIAGNOSIS — Y92239 Unspecified place in hospital as the place of occurrence of the external cause: Secondary | ICD-10-CM | POA: Diagnosis not present

## 2023-08-14 DIAGNOSIS — G4730 Sleep apnea, unspecified: Secondary | ICD-10-CM | POA: Diagnosis present

## 2023-08-14 DIAGNOSIS — Z951 Presence of aortocoronary bypass graft: Secondary | ICD-10-CM

## 2023-08-14 DIAGNOSIS — Z794 Long term (current) use of insulin: Secondary | ICD-10-CM

## 2023-08-14 DIAGNOSIS — Z820 Family history of epilepsy and other diseases of the nervous system: Secondary | ICD-10-CM

## 2023-08-14 DIAGNOSIS — R0602 Shortness of breath: Secondary | ICD-10-CM | POA: Diagnosis not present

## 2023-08-14 DIAGNOSIS — S2231XA Fracture of one rib, right side, initial encounter for closed fracture: Secondary | ICD-10-CM | POA: Diagnosis present

## 2023-08-14 DIAGNOSIS — Z881 Allergy status to other antibiotic agents status: Secondary | ICD-10-CM

## 2023-08-14 DIAGNOSIS — R197 Diarrhea, unspecified: Secondary | ICD-10-CM | POA: Diagnosis not present

## 2023-08-14 DIAGNOSIS — I10 Essential (primary) hypertension: Secondary | ICD-10-CM | POA: Diagnosis not present

## 2023-08-14 DIAGNOSIS — I480 Paroxysmal atrial fibrillation: Secondary | ICD-10-CM | POA: Diagnosis present

## 2023-08-14 DIAGNOSIS — Z9049 Acquired absence of other specified parts of digestive tract: Secondary | ICD-10-CM

## 2023-08-14 DIAGNOSIS — Z833 Family history of diabetes mellitus: Secondary | ICD-10-CM

## 2023-08-14 DIAGNOSIS — E1140 Type 2 diabetes mellitus with diabetic neuropathy, unspecified: Secondary | ICD-10-CM | POA: Diagnosis present

## 2023-08-14 DIAGNOSIS — E1122 Type 2 diabetes mellitus with diabetic chronic kidney disease: Secondary | ICD-10-CM | POA: Diagnosis present

## 2023-08-14 DIAGNOSIS — F411 Generalized anxiety disorder: Secondary | ICD-10-CM | POA: Diagnosis present

## 2023-08-14 DIAGNOSIS — G4700 Insomnia, unspecified: Secondary | ICD-10-CM | POA: Diagnosis present

## 2023-08-14 DIAGNOSIS — E1151 Type 2 diabetes mellitus with diabetic peripheral angiopathy without gangrene: Secondary | ICD-10-CM | POA: Diagnosis present

## 2023-08-14 DIAGNOSIS — E876 Hypokalemia: Secondary | ICD-10-CM | POA: Diagnosis present

## 2023-08-14 DIAGNOSIS — I4892 Unspecified atrial flutter: Secondary | ICD-10-CM | POA: Diagnosis present

## 2023-08-14 DIAGNOSIS — I4891 Unspecified atrial fibrillation: Secondary | ICD-10-CM | POA: Diagnosis not present

## 2023-08-14 DIAGNOSIS — Z8249 Family history of ischemic heart disease and other diseases of the circulatory system: Secondary | ICD-10-CM

## 2023-08-14 DIAGNOSIS — Z9841 Cataract extraction status, right eye: Secondary | ICD-10-CM

## 2023-08-14 DIAGNOSIS — Z79899 Other long term (current) drug therapy: Secondary | ICD-10-CM

## 2023-08-14 DIAGNOSIS — E871 Hypo-osmolality and hyponatremia: Secondary | ICD-10-CM | POA: Diagnosis present

## 2023-08-14 DIAGNOSIS — I5033 Acute on chronic diastolic (congestive) heart failure: Secondary | ICD-10-CM | POA: Diagnosis present

## 2023-08-14 DIAGNOSIS — G4733 Obstructive sleep apnea (adult) (pediatric): Secondary | ICD-10-CM | POA: Diagnosis present

## 2023-08-14 DIAGNOSIS — Z86718 Personal history of other venous thrombosis and embolism: Secondary | ICD-10-CM

## 2023-08-14 DIAGNOSIS — I493 Ventricular premature depolarization: Secondary | ICD-10-CM | POA: Diagnosis not present

## 2023-08-14 DIAGNOSIS — J9811 Atelectasis: Secondary | ICD-10-CM | POA: Diagnosis present

## 2023-08-14 DIAGNOSIS — D62 Acute posthemorrhagic anemia: Secondary | ICD-10-CM | POA: Diagnosis not present

## 2023-08-14 DIAGNOSIS — J449 Chronic obstructive pulmonary disease, unspecified: Secondary | ICD-10-CM | POA: Diagnosis present

## 2023-08-14 DIAGNOSIS — Z9842 Cataract extraction status, left eye: Secondary | ICD-10-CM

## 2023-08-14 DIAGNOSIS — I130 Hypertensive heart and chronic kidney disease with heart failure and stage 1 through stage 4 chronic kidney disease, or unspecified chronic kidney disease: Secondary | ICD-10-CM | POA: Diagnosis not present

## 2023-08-14 DIAGNOSIS — I2510 Atherosclerotic heart disease of native coronary artery without angina pectoris: Secondary | ICD-10-CM | POA: Diagnosis present

## 2023-08-14 DIAGNOSIS — Z860100 Personal history of colon polyps, unspecified: Secondary | ICD-10-CM

## 2023-08-14 DIAGNOSIS — I214 Non-ST elevation (NSTEMI) myocardial infarction: Secondary | ICD-10-CM | POA: Diagnosis present

## 2023-08-14 DIAGNOSIS — Z7901 Long term (current) use of anticoagulants: Secondary | ICD-10-CM

## 2023-08-14 DIAGNOSIS — Z6834 Body mass index (BMI) 34.0-34.9, adult: Secondary | ICD-10-CM

## 2023-08-14 DIAGNOSIS — Z961 Presence of intraocular lens: Secondary | ICD-10-CM | POA: Diagnosis present

## 2023-08-14 DIAGNOSIS — Z87891 Personal history of nicotine dependence: Secondary | ICD-10-CM

## 2023-08-14 DIAGNOSIS — T380X5A Adverse effect of glucocorticoids and synthetic analogues, initial encounter: Secondary | ICD-10-CM | POA: Diagnosis not present

## 2023-08-14 DIAGNOSIS — R609 Edema, unspecified: Secondary | ICD-10-CM | POA: Diagnosis not present

## 2023-08-14 DIAGNOSIS — E1165 Type 2 diabetes mellitus with hyperglycemia: Secondary | ICD-10-CM

## 2023-08-14 DIAGNOSIS — E785 Hyperlipidemia, unspecified: Secondary | ICD-10-CM | POA: Diagnosis present

## 2023-08-14 DIAGNOSIS — N182 Chronic kidney disease, stage 2 (mild): Secondary | ICD-10-CM | POA: Diagnosis present

## 2023-08-14 DIAGNOSIS — Z888 Allergy status to other drugs, medicaments and biological substances status: Secondary | ICD-10-CM

## 2023-08-14 DIAGNOSIS — Z8673 Personal history of transient ischemic attack (TIA), and cerebral infarction without residual deficits: Secondary | ICD-10-CM

## 2023-08-14 DIAGNOSIS — I081 Rheumatic disorders of both mitral and tricuspid valves: Secondary | ICD-10-CM | POA: Diagnosis present

## 2023-08-14 DIAGNOSIS — E66811 Obesity, class 1: Secondary | ICD-10-CM | POA: Diagnosis present

## 2023-08-14 DIAGNOSIS — I447 Left bundle-branch block, unspecified: Secondary | ICD-10-CM | POA: Diagnosis present

## 2023-08-14 DIAGNOSIS — Z823 Family history of stroke: Secondary | ICD-10-CM

## 2023-08-14 DIAGNOSIS — I5043 Acute on chronic combined systolic (congestive) and diastolic (congestive) heart failure: Secondary | ICD-10-CM | POA: Diagnosis present

## 2023-08-14 DIAGNOSIS — Z9185 Personal history of military service: Secondary | ICD-10-CM

## 2023-08-14 LAB — CBC
HCT: 34.2 % — ABNORMAL LOW (ref 39.0–52.0)
Hemoglobin: 11 g/dL — ABNORMAL LOW (ref 13.0–17.0)
MCH: 28.9 pg (ref 26.0–34.0)
MCHC: 32.2 g/dL (ref 30.0–36.0)
MCV: 90 fL (ref 80.0–100.0)
Platelets: 559 10*3/uL — ABNORMAL HIGH (ref 150–400)
RBC: 3.8 MIL/uL — ABNORMAL LOW (ref 4.22–5.81)
RDW: 13.9 % (ref 11.5–15.5)
WBC: 10.5 10*3/uL (ref 4.0–10.5)
nRBC: 0 % (ref 0.0–0.2)

## 2023-08-14 LAB — BASIC METABOLIC PANEL WITH GFR
Anion gap: 11 (ref 5–15)
BUN: 13 mg/dL (ref 8–23)
CO2: 27 mmol/L (ref 22–32)
Calcium: 8.3 mg/dL — ABNORMAL LOW (ref 8.9–10.3)
Chloride: 90 mmol/L — ABNORMAL LOW (ref 98–111)
Creatinine, Ser: 0.73 mg/dL (ref 0.61–1.24)
GFR, Estimated: >60 mL/min (ref >60)
Glucose, Bld: 207 mg/dL — ABNORMAL HIGH (ref 70–99)
Potassium: 4.1 mmol/L (ref 3.5–5.1)
Sodium: 128 mmol/L — ABNORMAL LOW (ref 135–145)

## 2023-08-14 LAB — TROPONIN I (HIGH SENSITIVITY): Troponin I (High Sensitivity): 83 ng/L — ABNORMAL HIGH (ref <18)

## 2023-08-14 MED ORDER — LORAZEPAM 2 MG/ML IJ SOLN
0.5000 mg | Freq: Once | INTRAMUSCULAR | Status: AC
  Administered 2023-08-14: 0.5 mg via INTRAVENOUS
  Filled 2023-08-14: qty 1

## 2023-08-14 NOTE — ED Triage Notes (Signed)
 Pt coming from [facility] for SOB. Pt had a CABG last week on Monday and started having SOB today. Pt was taken off his fluid pill per him. Pt has edema all over. Pt placed on CPAP by EMS for increased work of breathing and swallow resps

## 2023-08-14 NOTE — ED Provider Notes (Addendum)
 [place] [facility]ERGENC[facility] DEPARTMENT AT [facility] Provider Note   CSN: [id number] Arrival date & time: 08/14/23  2143     Patient presents with: Shortness of Breath   [name]. is a 73 y.o. male.   The history is provided by the patient, medical records and the EMS personnel. No language interpreter was used.  Shortness of Breath Severity:  Severe Onset quality:  Gradual Duration:  1 day Timing:  Constant Progression:  Worsening Chronicity:  Recurrent Context: not URI   Relieved by:  Nothing Worsened by:  Exertion and deep breathing Ineffective treatments:  None tried Associated symptoms: no abdominal pain, no chest pain, no cough, no fever, no headaches, no neck pain, no rash, no sputum production, no vomiting and no wheezing        Prior to Admission medications   Medication Sig Start Date End Date Taking? Authorizing Provider  acetaminophen  (TYLENOL ) 325 MG tablet Take 2 tablets (650 mg total) by mouth every 6 (six) hours as needed for mild pain (pain score 1-3). 08/10/23   Raguel Con RAMAN, PA-C  apixaban  (ELIQUIS ) 5 MG TABS tablet Take 5 mg by mouth 2 (two) times daily. 09/27/21   [provider]  aspirin  EC 81 MG tablet Take 1 tablet (81 mg total) by mouth daily. Swallow whole. 08/10/23   Raguel Con RAMAN, PA-C  Azelastine  HCl 137 MCG/SPRAY SOLN Place 1 spray into both nostrils 2 (two) times daily.  06/20/19   [provider]  carvedilol  (COREG ) 12.5 MG tablet Take 1 tablet (12.5 mg total) by mouth 2 (two) times daily. 08/10/23   Raguel Con RAMAN, PA-C  Cholecalciferol (VITAMIN D) 50 MCG (2000 UT) CAPS Take 1 capsule by mouth every evening.    [provider]  Coenzyme Q10 (COQ10) 100 MG CAPS Take 100 mg by mouth daily.    [provider]  cromolyn  (OPTICROM ) 4 % ophthalmic solution Place 1 drop into both eyes 4 (four) times daily as needed (itchy eyes).    [provider]  fluticasone  (FLONASE ) 50 MCG/ACT  nasal spray Place 2 sprays into both nostrils 2 (two) times daily. 09/27/21   [provider]  furosemide  (LASIX ) 20 MG tablet Take 1 tablet (20 mg total) by mouth daily. 08/13/23   Raguel Con RAMAN, PA-C  Glucosamine-Chondroit-Vit C-Mn (GLUCOSAMINE 1500 COMPLEX PO) Take 1 tablet by mouth every evening.    [provider]  hydrOXYzine  (VISTARIL ) 25 MG capsule Take 25 mg by mouth 2 (two) times daily as needed (for sleep per patient).    [provider]  insulin  aspart (NOVOLOG ) 100 UNIT/ML injection Inject 0-8 Units into the skin 3 (three) times daily as needed for high blood sugar.     [provider]  insulin  glargine (LANTUS ) 100 UNIT/ML injection Inject 20 Units into the skin 2 (two) times daily.    [provider]  MAGNESIUM  OXIDE PO Take 500 mg by mouth at bedtime.    [provider]  Multiple Vitamin (MULTIVITAMIN WITH MINERALS) TABS Take 1 tablet by mouth every evening.     [provider]  neomycin-polymyxin-hydrocortisone (CORTISPORIN) OTIC solution Place 4 drops into the right ear 4 (four) times daily as needed (itchy ears).    [provider]  olopatadine  (PATANOL) 0.1 % ophthalmic solution Place 1 drop into both eyes 2 (two) times daily.    [provider]  OVER THE COUNTER MEDICATION Take 1 capsule by mouth at bedtime. Oil of Oregano  [provider]  oxyCODONE  (OXY IR/ROXICODONE ) 5 MG immediate release tablet Take 1 tablet (5 mg total) by mouth every 6 (six) hours as needed for severe pain (pain score 7-10). 08/10/23   Raguel Con RAMAN, PA-C  polyethylene glycol powder (GLYCOLAX /MIRALAX ) 17 GM/SCOOP powder Take 34 g by mouth at bedtime. 10/06/19   [provider]  potassium gluconate 595 MG TABS Take 595 mg by mouth every evening.     [provider]  Probiotic Product (PROBIOTIC DAILY PO) Take 2 capsules by mouth every evening.    [provider]  tadalafil (CIALIS) 20 MG  tablet Take 20 mg by mouth daily as needed for erectile dysfunction.    [provider]  terazosin  (HYTRIN ) 2 MG capsule Take 2 mg by mouth at bedtime. 12/14/22   [provider]  Tiotropium Bromide  Monohydrate (SPIRIVA  RESPIMAT) 2.5 MCG/ACT AERS Inhale 2 puffs into the lungs daily.    [provider]  traZODone  (DESYREL ) 100 MG tablet Take 100 mg by mouth at bedtime. 06/18/23   [provider]  TURMERIC PO Take 1 tablet by mouth every evening. With Ginger    [provider]  Vitamin A 2400 MCG (8000 UT) CAPS Take 8,000 Units by mouth every evening.    [provider]  vitamin B-12 (CYANOCOBALAMIN) 500 MCG tablet Take 500 mcg by mouth every evening.    [provider]    Allergies: Ace inhibitors, Gabapentin, Peanut oil, Tamiflu [oseltamivir phosphate], Losartan, Lyrica [pregabalin], Oxcarbazepine, Statins, Avelox [moxifloxacin hcl in nacl], Ciprofloxacin , Citalopram, and Victoza [liraglutide]    Review of Systems  Constitutional:  Negative for chills, fatigue and fever.  HENT:  Negative for congestion.   Respiratory:  Positive for shortness of breath. Negative for cough, sputum production, chest tightness and wheezing.   Cardiovascular:  Positive for leg swelling (persistet). Negative for chest pain and palpitations.  Gastrointestinal:  Negative for abdominal pain, constipation, diarrhea, nausea and vomiting.  Genitourinary:  Negative for dysuria and flank pain.  Musculoskeletal:  Negative for back pain, neck pain and neck stiffness.  Skin:  Negative for rash and wound.  Neurological:  Negative for headaches.  Psychiatric/Behavioral:  Negative for agitation and confusion.   All other systems reviewed and are negative.   Updated Vital Signs Pulse 86   Temp 98.5 F (36.9 C)   Resp (!) 25   SpO2 99%   Physical Exam Vitals and nursing note reviewed.  Constitutional:      General: He is not in acute distress.    Appearance:  He is well-developed. He is not ill-appearing, toxic-appearing or diaphoretic.  HENT:     Head: Normocephalic and atraumatic.  Eyes:     Conjunctiva/sclera: Conjunctivae normal.     Pupils: Pupils are equal, round, and reactive to light.  Cardiovascular:     Rate and Rhythm: Normal rate and regular rhythm.     Heart sounds: No murmur heard. Pulmonary:     Effort: Tachypnea present. No respiratory distress.     Breath sounds: Rales present. No rhonchi.  Chest:     Chest wall: No tenderness.  Abdominal:     Palpations: Abdomen is soft.     Tenderness: There is no abdominal tenderness.  Musculoskeletal:        General: No swelling.     Cervical back: Neck supple.     Right lower leg: Edema present.     Left lower leg: Edema present.  Skin:    General: Skin is  warm and dry.     Capillary Refill: Capillary refill takes less than 2 seconds.     Findings: Ecchymosis present.  Neurological:     Mental Status: He is alert.  Psychiatric:        Mood and Affect: Mood normal.     (all labs ordered are listed, but only abnormal results are displayed) Labs Reviewed  BASIC METABOLIC PANEL WITH GFR - Abnormal; Notable for the following components:      Result Value   Sodium 128 (*)    Chloride 90 (*)    Glucose, Bld 207 (*)    Calcium  8.3 (*)    All other components within normal limits  CBC - Abnormal; Notable for the following components:   RBC 3.80 (*)    Hemoglobin 11.0 (*)    HCT 34.2 (*)    Platelets 559 (*)    All other components within normal limits  TROPONIN I (HIGH SENSITIVITY) - Abnormal; Notable for the following components:   Troponin I (High Sensitivity) 83 (*)    All other components within normal limits  BRAIN NATRIURETIC PEPTIDE  TROPONIN I (HIGH SENSITIVITY)    EKG: EKG Interpretation Date/Time:  Tuesday August 14 2023 21:44:39 EDT Ventricular Rate:  89 PR Interval:    QRS Duration:  164 QT Interval:  413 QTC Calculation: 503 R Axis:   -29  Text  Interpretation: Atrial flutter with predominant 3:1 AV block Right bundle branch block when compared to prior, similar apperance No STEMI Confirmed by Ginger Barefoot ([id number]) on 08/14/2023 9:53:35 PM  Radiology: [facility] Chest Portable 1 View Result Date: 08/14/2023 CLINICAL DATA:  sob EXAM: PORTABLE CHEST 1 VIEW COMPARISON:  Chest x-ray 08/09/2023, chest x-ray 08/01/2023 FINDINGS: Stable cardiomediastinal enlargement. The heart and mediastinal contours are unchanged. Left atrial appendage clip. No focal consolidation. Persistent mild pulmonary edema. Possible trace left pleural effusion. No pneumothorax. No acute osseous abnormality.  Sternotomy wires are intact. IMPRESSION: 1. Persistent mild pulmonary edema. 2. Possible trace left pleural effusion. Electronically Signed   By: Morgane  Naveau M.D.   On: 08/14/2023 22:26     Procedures   Medications Ordered in the ED  LORazepam  (ATIVAN ) injection 0.5 mg (0.5 mg Intravenous Given 08/14/23 2257)                                    Medical Decision Making Amount and/or Complexity of Data Reviewed Labs: ordered. Radiology: ordered.  Risk Prescription drug management. Decision regarding hospitalization.    Alan Sevastian Witczak. is a 73 y.o. 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  According to patient, he started having shortness of breath for the last day and a half and feels similar to how he felt before he had to have his bypass surgery.  He reports he could not breathe but was not having chest pain.  He reports persistent edema but does not seem worse.  He denies fevers, chills, congestion, or cough.  EMS reported he had increased work of breathing and was in respiratory distress and put him  on CPAP.  Patient arrives and is now on BiPAP and is starting to breathe better.  He is still denying chest pain but reports this is  how he felt before his MI.  He otherwise denies nausea, vomiting, constipation, diarrhea, or urinary changes.  On exam, lungs have some rales but did not have rhonchi or wheezing.  Chest and abdomen nontender.  He has bruising on his leg from his recent procedure but reports it is unchanged.  He had intact pulses on my initial exam.  He is tachypneic but is not hypoxic.  He is afebrile.  EKG does not show STEMI.  Given the patient's recent discharge for NSTEMI and recent surgery, I am concerned about a cardiac etiology.  Will check troponin and get screening labs.    Will get a chest x-ray.  Chest x-ray shows persistent mild pulmonary edema and trace pleural effusion but no pneumothorax seen.  Initial troponin is in the 80s which is improved from prior.  I suggested surgery, pulmonary embolism is considered given his history of clotting and recently being off of his anticoagulation due to the surgery however he reports he is back on it and the symptoms just started over the last 36 hours.  Will discuss with cardiology if they want a CT PE study.  Due to his persistent shortness of breath and work of breathing anticipate admission.  I spoke to the cardiology consult team once work began to return and they recommended CT PE study and they would see the patient in consultation for the increased work of breathing after recent NSTEMI.  Spoke to Dr. Sherrine with CT surgery who agreed with getting a CT PE study and then they recommended medicine admission.  They would like Dr. Kerrin who operated on the patient no to see the patient likely in the morning.  11:45 PM Care transferred oncoming team to wait for results of CT PE study and then likely hospital admission for shortness of breath worsening      Final diagnoses:  SOB (shortness of breath)      Clinical Impression: 1. SOB (shortness of breath)     Disposition: Admit  This note was prepared with assistance of Dragon voice recognition software. Occasional wrong-word or sound-a-like substitutions may have occurred due to the inherent limitations of voice recognition software.      Argusta Mcgann, Lonni PARAS, MD 08/14/23 2348  CRITICAL CARE Performed by: Lonni PARAS Virgel Haro Total critical care time: 24 minutes Critical care time was exclusive of separately billable procedures and treating other patients. Critical care was necessary to treat or prevent imminent or life-threatening deterioration. Critical care was time spent personally by me on the following activities: development of treatment plan with patient and/or surrogate as well as nursing, discussions with consultants, evaluation of patient's response to treatment, examination of patient, obtaining history from patient or surrogate, ordering and performing treatments and interventions, ordering and review of laboratory studies, ordering and review of radiographic studies, pulse oximetry and re-evaluation of patient's condition.    Donnae Michels, Lonni PARAS, MD 09/05/23 [phone]

## 2023-08-14 NOTE — ED Notes (Signed)
 Family at bedside.

## 2023-08-15 ENCOUNTER — Encounter (HOSPITAL_COMMUNITY): Payer: Self-pay | Admitting: Family Medicine

## 2023-08-15 ENCOUNTER — Emergency Department (HOSPITAL_COMMUNITY)

## 2023-08-15 ENCOUNTER — Telehealth (HOSPITAL_COMMUNITY): Payer: Self-pay

## 2023-08-15 ENCOUNTER — Inpatient Hospital Stay (HOSPITAL_COMMUNITY)

## 2023-08-15 ENCOUNTER — Other Ambulatory Visit (HOSPITAL_COMMUNITY)

## 2023-08-15 DIAGNOSIS — I480 Paroxysmal atrial fibrillation: Secondary | ICD-10-CM | POA: Diagnosis present

## 2023-08-15 DIAGNOSIS — I110 Hypertensive heart disease with heart failure: Secondary | ICD-10-CM | POA: Diagnosis not present

## 2023-08-15 DIAGNOSIS — Z794 Long term (current) use of insulin: Secondary | ICD-10-CM | POA: Diagnosis not present

## 2023-08-15 DIAGNOSIS — S8011XA Contusion of right lower leg, initial encounter: Secondary | ICD-10-CM | POA: Diagnosis present

## 2023-08-15 DIAGNOSIS — R0609 Other forms of dyspnea: Secondary | ICD-10-CM | POA: Diagnosis not present

## 2023-08-15 DIAGNOSIS — J449 Chronic obstructive pulmonary disease, unspecified: Secondary | ICD-10-CM

## 2023-08-15 DIAGNOSIS — E1165 Type 2 diabetes mellitus with hyperglycemia: Secondary | ICD-10-CM

## 2023-08-15 DIAGNOSIS — I5033 Acute on chronic diastolic (congestive) heart failure: Secondary | ICD-10-CM | POA: Diagnosis present

## 2023-08-15 DIAGNOSIS — E876 Hypokalemia: Secondary | ICD-10-CM | POA: Diagnosis present

## 2023-08-15 DIAGNOSIS — Z7901 Long term (current) use of anticoagulants: Secondary | ICD-10-CM | POA: Diagnosis not present

## 2023-08-15 DIAGNOSIS — K219 Gastro-esophageal reflux disease without esophagitis: Secondary | ICD-10-CM | POA: Diagnosis not present

## 2023-08-15 DIAGNOSIS — I4892 Unspecified atrial flutter: Secondary | ICD-10-CM | POA: Diagnosis present

## 2023-08-15 DIAGNOSIS — D62 Acute posthemorrhagic anemia: Secondary | ICD-10-CM | POA: Diagnosis not present

## 2023-08-15 DIAGNOSIS — E871 Hypo-osmolality and hyponatremia: Secondary | ICD-10-CM | POA: Diagnosis present

## 2023-08-15 DIAGNOSIS — H903 Sensorineural hearing loss, bilateral: Secondary | ICD-10-CM | POA: Diagnosis not present

## 2023-08-15 DIAGNOSIS — I739 Peripheral vascular disease, unspecified: Secondary | ICD-10-CM | POA: Diagnosis not present

## 2023-08-15 DIAGNOSIS — E1169 Type 2 diabetes mellitus with other specified complication: Secondary | ICD-10-CM | POA: Diagnosis not present

## 2023-08-15 DIAGNOSIS — J9811 Atelectasis: Secondary | ICD-10-CM | POA: Diagnosis present

## 2023-08-15 DIAGNOSIS — I10 Essential (primary) hypertension: Secondary | ICD-10-CM | POA: Diagnosis not present

## 2023-08-15 DIAGNOSIS — R531 Weakness: Secondary | ICD-10-CM | POA: Diagnosis not present

## 2023-08-15 DIAGNOSIS — I081 Rheumatic disorders of both mitral and tricuspid valves: Secondary | ICD-10-CM | POA: Diagnosis present

## 2023-08-15 DIAGNOSIS — E1122 Type 2 diabetes mellitus with diabetic chronic kidney disease: Secondary | ICD-10-CM | POA: Diagnosis present

## 2023-08-15 DIAGNOSIS — Z6834 Body mass index (BMI) 34.0-34.9, adult: Secondary | ICD-10-CM | POA: Diagnosis not present

## 2023-08-15 DIAGNOSIS — I2510 Atherosclerotic heart disease of native coronary artery without angina pectoris: Secondary | ICD-10-CM | POA: Diagnosis not present

## 2023-08-15 DIAGNOSIS — Z9989 Dependence on other enabling machines and devices: Secondary | ICD-10-CM | POA: Diagnosis not present

## 2023-08-15 DIAGNOSIS — E785 Hyperlipidemia, unspecified: Secondary | ICD-10-CM | POA: Diagnosis present

## 2023-08-15 DIAGNOSIS — Z7401 Bed confinement status: Secondary | ICD-10-CM | POA: Diagnosis not present

## 2023-08-15 DIAGNOSIS — I214 Non-ST elevation (NSTEMI) myocardial infarction: Secondary | ICD-10-CM | POA: Diagnosis present

## 2023-08-15 DIAGNOSIS — I509 Heart failure, unspecified: Secondary | ICD-10-CM

## 2023-08-15 DIAGNOSIS — R0602 Shortness of breath: Secondary | ICD-10-CM | POA: Diagnosis present

## 2023-08-15 DIAGNOSIS — G4730 Sleep apnea, unspecified: Secondary | ICD-10-CM | POA: Diagnosis not present

## 2023-08-15 DIAGNOSIS — I4819 Other persistent atrial fibrillation: Secondary | ICD-10-CM | POA: Diagnosis not present

## 2023-08-15 DIAGNOSIS — S2231XA Fracture of one rib, right side, initial encounter for closed fracture: Secondary | ICD-10-CM | POA: Diagnosis present

## 2023-08-15 DIAGNOSIS — I959 Hypotension, unspecified: Secondary | ICD-10-CM | POA: Diagnosis not present

## 2023-08-15 DIAGNOSIS — F411 Generalized anxiety disorder: Secondary | ICD-10-CM | POA: Diagnosis present

## 2023-08-15 DIAGNOSIS — E1140 Type 2 diabetes mellitus with diabetic neuropathy, unspecified: Secondary | ICD-10-CM | POA: Diagnosis present

## 2023-08-15 DIAGNOSIS — Y92239 Unspecified place in hospital as the place of occurrence of the external cause: Secondary | ICD-10-CM | POA: Diagnosis not present

## 2023-08-15 DIAGNOSIS — E1151 Type 2 diabetes mellitus with diabetic peripheral angiopathy without gangrene: Secondary | ICD-10-CM | POA: Diagnosis present

## 2023-08-15 DIAGNOSIS — I130 Hypertensive heart and chronic kidney disease with heart failure and stage 1 through stage 4 chronic kidney disease, or unspecified chronic kidney disease: Secondary | ICD-10-CM | POA: Diagnosis present

## 2023-08-15 DIAGNOSIS — J309 Allergic rhinitis, unspecified: Secondary | ICD-10-CM | POA: Diagnosis not present

## 2023-08-15 DIAGNOSIS — I499 Cardiac arrhythmia, unspecified: Secondary | ICD-10-CM | POA: Diagnosis not present

## 2023-08-15 DIAGNOSIS — I483 Typical atrial flutter: Secondary | ICD-10-CM | POA: Diagnosis not present

## 2023-08-15 DIAGNOSIS — Z951 Presence of aortocoronary bypass graft: Secondary | ICD-10-CM | POA: Diagnosis not present

## 2023-08-15 DIAGNOSIS — I5043 Acute on chronic combined systolic (congestive) and diastolic (congestive) heart failure: Secondary | ICD-10-CM | POA: Diagnosis present

## 2023-08-15 DIAGNOSIS — G4733 Obstructive sleep apnea (adult) (pediatric): Secondary | ICD-10-CM | POA: Diagnosis not present

## 2023-08-15 DIAGNOSIS — I340 Nonrheumatic mitral (valve) insufficiency: Secondary | ICD-10-CM | POA: Diagnosis not present

## 2023-08-15 LAB — BASIC METABOLIC PANEL WITH GFR
Anion gap: 9 (ref 5–15)
BUN: 10 mg/dL (ref 8–23)
CO2: 28 mmol/L (ref 22–32)
Calcium: 7.9 mg/dL — ABNORMAL LOW (ref 8.9–10.3)
Chloride: 93 mmol/L — ABNORMAL LOW (ref 98–111)
Creatinine, Ser: 0.75 mg/dL (ref 0.61–1.24)
GFR, Estimated: >60 mL/min (ref >60)
Glucose, Bld: 141 mg/dL — ABNORMAL HIGH (ref 70–99)
Potassium: 3.6 mmol/L (ref 3.5–5.1)
Sodium: 130 mmol/L — ABNORMAL LOW (ref 135–145)

## 2023-08-15 LAB — ECHOCARDIOGRAM COMPLETE
AR max vel: 2.52 cm2
AV Area VTI: 2.42 cm2
AV Area mean vel: 2.3 cm2
AV Mean grad: 7 mmHg
AV Peak grad: 13.1 mmHg
Ao pk vel: 1.81 m/s
Area-P 1/2: 3.99 cm2
Calc EF: 55.6 %
Height: 74 in
MV VTI: 1.68 cm2
S' Lateral: 2.9 cm
Single Plane A2C EF: 52.9 %
Single Plane A4C EF: 57.7 %
Weight: 4350.4 [oz_av]

## 2023-08-15 LAB — GLUCOSE, CAPILLARY
Glucose-Capillary: 132 mg/dL — ABNORMAL HIGH (ref 70–99)
Glucose-Capillary: 143 mg/dL — ABNORMAL HIGH (ref 70–99)
Glucose-Capillary: 152 mg/dL — ABNORMAL HIGH (ref 70–99)

## 2023-08-15 LAB — CBG MONITORING, ED
Glucose-Capillary: 164 mg/dL — ABNORMAL HIGH (ref 70–99)
Glucose-Capillary: 128 mg/dL — ABNORMAL HIGH (ref 70–99)
Glucose-Capillary: 155 mg/dL — ABNORMAL HIGH (ref 70–99)

## 2023-08-15 LAB — CBC
HCT: 33.9 % — ABNORMAL LOW (ref 39.0–52.0)
Hemoglobin: 11 g/dL — ABNORMAL LOW (ref 13.0–17.0)
MCH: 29.5 pg (ref 26.0–34.0)
MCHC: 32.4 g/dL (ref 30.0–36.0)
MCV: 90.9 fL (ref 80.0–100.0)
Platelets: 569 10*3/uL — ABNORMAL HIGH (ref 150–400)
RBC: 3.73 MIL/uL — ABNORMAL LOW (ref 4.22–5.81)
RDW: 13.9 % (ref 11.5–15.5)
WBC: 11.5 10*3/uL — ABNORMAL HIGH (ref 4.0–10.5)
nRBC: 0 % (ref 0.0–0.2)

## 2023-08-15 LAB — BRAIN NATRIURETIC PEPTIDE: B Natriuretic Peptide: 488.7 pg/mL — ABNORMAL HIGH (ref 0.0–100.0)

## 2023-08-15 LAB — MAGNESIUM: Magnesium: 2.1 mg/dL (ref 1.7–2.4)

## 2023-08-15 LAB — TROPONIN I (HIGH SENSITIVITY)
Troponin I (High Sensitivity): 80 ng/L — ABNORMAL HIGH (ref <18)
Troponin I (High Sensitivity): 82 ng/L — ABNORMAL HIGH (ref <18)

## 2023-08-15 MED ORDER — CARVEDILOL 12.5 MG PO TABS
12.5000 mg | ORAL_TABLET | Freq: Two times a day (BID) | ORAL | Status: DC
  Administered 2023-08-15 – 2023-08-17 (×5): 12.5 mg via ORAL
  Filled 2023-08-15 (×5): qty 1

## 2023-08-15 MED ORDER — FUROSEMIDE 10 MG/ML IJ SOLN
40.0000 mg | Freq: Two times a day (BID) | INTRAMUSCULAR | Status: DC
  Administered 2023-08-15 – 2023-08-17 (×6): 40 mg via INTRAVENOUS
  Filled 2023-08-15 (×6): qty 4

## 2023-08-15 MED ORDER — TERAZOSIN HCL 1 MG PO CAPS
2.0000 mg | ORAL_CAPSULE | Freq: Every day | ORAL | Status: DC
  Administered 2023-08-15 – 2023-08-26 (×12): 2 mg via ORAL
  Filled 2023-08-15 (×13): qty 2

## 2023-08-15 MED ORDER — SODIUM CHLORIDE 0.9% FLUSH
3.0000 mL | Freq: Two times a day (BID) | INTRAVENOUS | Status: DC
  Administered 2023-08-15 – 2023-08-27 (×21): 3 mL via INTRAVENOUS

## 2023-08-15 MED ORDER — LORAZEPAM 2 MG/ML IJ SOLN: 1.0000 mg | Freq: Once | INTRAMUSCULAR | Status: DC

## 2023-08-15 MED ORDER — FLUTICASONE PROPIONATE 50 MCG/ACT NA SUSP
2.0000 | Freq: Two times a day (BID) | NASAL | Status: DC
  Administered 2023-08-15 – 2023-08-27 (×17): 2 via NASAL
  Filled 2023-08-15: qty 16

## 2023-08-15 MED ORDER — TRIMETHOBENZAMIDE HCL 100 MG/ML IM SOLN: 200.0000 mg | Freq: Four times a day (QID) | INTRAMUSCULAR | Status: DC | PRN

## 2023-08-15 MED ORDER — POLYETHYLENE GLYCOL 3350 17 GM/SCOOP PO POWD
34.0000 g | Freq: Every day | ORAL | Status: DC
  Filled 2023-08-15: qty 119

## 2023-08-15 MED ORDER — AZELASTINE HCL 0.1 % NA SOLN
1.0000 | Freq: Two times a day (BID) | NASAL | Status: DC
  Administered 2023-08-17 – 2023-08-27 (×11): 1 via NASAL
  Filled 2023-08-15: qty 30

## 2023-08-15 MED ORDER — LORAZEPAM 2 MG/ML IJ SOLN
1.0000 mg | Freq: Once | INTRAMUSCULAR | Status: AC | PRN
  Administered 2023-08-16: 1 mg via INTRAVENOUS
  Filled 2023-08-15: qty 1

## 2023-08-15 MED ORDER — IOHEXOL 350 MG/ML SOLN
75.0000 mL | Freq: Once | INTRAVENOUS | Status: AC | PRN
  Administered 2023-08-15: 75 mL via INTRAVENOUS

## 2023-08-15 MED ORDER — ASPIRIN 81 MG PO TBEC
81.0000 mg | DELAYED_RELEASE_TABLET | Freq: Every day | ORAL | Status: DC
  Administered 2023-08-15 – 2023-08-27 (×13): 81 mg via ORAL
  Filled 2023-08-15 (×13): qty 1

## 2023-08-15 MED ORDER — TRAZODONE HCL 100 MG PO TABS
100.0000 mg | ORAL_TABLET | Freq: Every day | ORAL | Status: DC
  Administered 2023-08-15 – 2023-08-26 (×12): 100 mg via ORAL
  Filled 2023-08-15 (×13): qty 1

## 2023-08-15 MED ORDER — ACETAMINOPHEN 650 MG RE SUPP: 650.0000 mg | Freq: Four times a day (QID) | RECTAL | Status: DC | PRN

## 2023-08-15 MED ORDER — OXYCODONE HCL 5 MG PO TABS: 5.0000 mg | ORAL_TABLET | ORAL | Status: DC | PRN

## 2023-08-15 MED ORDER — ENSURE PLUS HIGH PROTEIN PO LIQD
237.0000 mL | Freq: Two times a day (BID) | ORAL | Status: DC
  Administered 2023-08-16 – 2023-08-27 (×15): 237 mL via ORAL
  Filled 2023-08-15: qty 237

## 2023-08-15 MED ORDER — FENTANYL CITRATE PF 50 MCG/ML IJ SOSY: 12.5000 ug | PREFILLED_SYRINGE | INTRAMUSCULAR | Status: DC | PRN

## 2023-08-15 MED ORDER — POLYETHYLENE GLYCOL 3350 17 G PO PACK
34.0000 g | PACK | Freq: Every day | ORAL | Status: DC
  Administered 2023-08-17 – 2023-08-23 (×8): 34 g via ORAL
  Filled 2023-08-15 (×10): qty 2

## 2023-08-15 MED ORDER — OLOPATADINE HCL 0.1 % OP SOLN
1.0000 [drp] | Freq: Two times a day (BID) | OPHTHALMIC | Status: DC
  Administered 2023-08-15 – 2023-08-17 (×4): 1 [drp] via OPHTHALMIC
  Filled 2023-08-15: qty 5

## 2023-08-15 MED ORDER — INSULIN ASPART 100 UNIT/ML IJ SOLN
0.0000 [IU] | INTRAMUSCULAR | Status: DC
  Administered 2023-08-15 – 2023-08-18 (×9): 1 [IU] via SUBCUTANEOUS
  Administered 2023-08-20: 2 [IU] via SUBCUTANEOUS
  Administered 2023-08-20 – 2023-08-21 (×2): 1 [IU] via SUBCUTANEOUS

## 2023-08-15 MED ORDER — LORAZEPAM 2 MG/ML IJ SOLN
1.0000 mg | Freq: Once | INTRAMUSCULAR | Status: AC
  Administered 2023-08-15: 1 mg via INTRAVENOUS
  Filled 2023-08-15: qty 1

## 2023-08-15 MED ORDER — ACETAMINOPHEN 325 MG PO TABS
650.0000 mg | ORAL_TABLET | Freq: Four times a day (QID) | ORAL | Status: DC | PRN
  Administered 2023-08-15 – 2023-08-27 (×20): 650 mg via ORAL
  Filled 2023-08-15 (×21): qty 2

## 2023-08-15 MED ORDER — SENNOSIDES-DOCUSATE SODIUM 8.6-50 MG PO TABS: 1.0000 | ORAL_TABLET | Freq: Every evening | ORAL | Status: DC | PRN

## 2023-08-15 MED ORDER — ALPRAZOLAM 0.25 MG PO TABS
0.2500 mg | ORAL_TABLET | Freq: Two times a day (BID) | ORAL | Status: DC
  Administered 2023-08-15 – 2023-08-27 (×25): 0.25 mg via ORAL
  Filled 2023-08-15 (×27): qty 1

## 2023-08-15 MED ORDER — COQ10 100 MG PO CAPS: 100.0000 mg | ORAL_CAPSULE | Freq: Every day | ORAL | Status: DC

## 2023-08-15 MED ORDER — INSULIN GLARGINE-YFGN 100 UNIT/ML ~~LOC~~ SOLN
20.0000 [IU] | Freq: Every day | SUBCUTANEOUS | Status: DC
  Administered 2023-08-15 – 2023-08-27 (×13): 20 [IU] via SUBCUTANEOUS
  Filled 2023-08-15 (×14): qty 0.2

## 2023-08-15 MED ORDER — ENSURE PLUS HIGH PROTEIN PO LIQD: 237.0000 mL | Freq: Two times a day (BID) | ORAL | Status: DC

## 2023-08-15 MED ORDER — APIXABAN 5 MG PO TABS
5.0000 mg | ORAL_TABLET | Freq: Two times a day (BID) | ORAL | Status: DC
  Administered 2023-08-15 – 2023-08-27 (×25): 5 mg via ORAL
  Filled 2023-08-15 (×25): qty 1

## 2023-08-15 MED ORDER — UMECLIDINIUM BROMIDE 62.5 MCG/ACT IN AEPB
1.0000 | INHALATION_SPRAY | Freq: Every day | RESPIRATORY_TRACT | Status: DC
  Filled 2023-08-15: qty 7

## 2023-08-15 NOTE — Progress Notes (Signed)
*  PRELIMINARY RESULTS* Echocardiogram 2D Echocardiogram has been performed.  Glenna Lango 08/15/2023, 2:55 PM

## 2023-08-15 NOTE — ED Notes (Signed)
 CCMD called.

## 2023-08-15 NOTE — H&P (Signed)
 History and Physical    [name]. AYT:[id number] DOB: 12-Apr-1950 DOA: 08/14/2023  PCP: [facility]   Patient coming from: SNF   Chief Complaint: SOB   HPI: [name]. is a 73 y.o. male with medical history significant for hypertension, type 2 diabetes mellitus, chronic diastolic CHF, PAF on Eliquis , BMI 35, OSA, and CAD s/p CABG on 07/30/2023 who now presents from his SNF for evaluation of shortness of breath.  Patient reports insidiously worsening dyspnea over the past several days which became much worse yesterday evening.  He also notes bilateral lower extremity swelling.  He denies any chest pain, fever, or chills and has not been coughing much.  He notes that he has been off of his diuretic recently.  He was placed on CPAP with EMS.  ED Course: Upon arrival to the ED, patient is found to be afebrile and saturating well on CPAP with tachypnea, normal HR, and elevated BP.  Labs are most notable for glucose 207, sodium 128, normal WBC, troponin 83, and BNP 489.  CTA chest is negative for PE but notable for nondisplaced right posterior first rib fracture and small bilateral pleural effusions with partial loculation on the left.  Cardiothoracic surgery was consulted by the ED physician and the patient was treated with CPAP in the ED.  Review of Systems:  All other systems reviewed and apart from HPI, are negative.  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    Neuromuscular disorder (HCC)    neuropathy   Non-toxic multinodular goiter 07/28/2023   Other psoriasis 07/28/2023   Paroxysmal atrial fibrillation (HCC) 09/22/2021   Peripheral vascular disease (HCC) 2006   dvt 's   Personal history of colon polyps, unspecified 07/28/2023   Phlebitis and thrombophlebitis of lower extremities 07/28/2023   Aug 28, 2008 Entered By: Loreen Roers Comment: h/o this in 2006     Pneumonia 2006   RBBB    S/P rotator cuff repair 05/20/2015   Sleep apnea    Subdural hematoma (HCC)     Past Surgical History:  Procedure Laterality Date   BRAIN SURGERY  2010   subdural hematoma -bleed6 months after heart cath   CATARACT EXTRACTION, BILATERAL  12/16, 2017   cataract   CHOLECYSTECTOMY N/A 09/08/2020   Procedure: LAPAROSCOPIC CHOLECYSTECTOMY WITH INTRAOPERATIVE CHOLANGIOGRAM;  Surgeon: Dareen Ebbing, MD;  Location: [facility] ORS;  Service: General;  Laterality: N/A;   CLIPPING OF ATRIAL APPENDAGE Left 07/30/2023   Procedure: CLIPPING, LEFT ATRIAL APPENDAGE;  Surgeon: Zelphia Higashi, MD;  Location: [facility] OR;  Service: Open Heart Surgery;  Laterality: Left;   COLONOSCOPY  2014   CORONARY ARTERY BYPASS GRAFT N/A 07/30/2023   Procedure: CORONARY ARTERY BYPASS GRAFTING (CABG) TIMES THREE USING THE LEFT INTERNAL MAMMARY ARTERY AND ENDOSCOPICALLY HARVESTED RIGHT GREATER SAPHENOUS VEIN;  Surgeon: Zelphia Higashi, MD;  Location:  [facility] OR;  Service: Open Heart Surgery;  Laterality: N/A;   CYSTOSCOPY WITH INSERTION OF UROLIFT     CYSTOSCOPY WITH INSERTION OF UROLIFT     DG THUMB LEFT HAND Left    gamers    INTRAOPERATIVE TRANSESOPHAGEAL ECHOCARDIOGRAM N/A 07/30/2023   Procedure: ECHOCARDIOGRAM, TRANSESOPHAGEAL, INTRAOPERATIVE;  Surgeon: Zelphia Higashi, MD;  Location: [facility] OR;  Service: Open Heart Surgery;  Laterality: N/A;   RIGHT/LEFT HEART CATH AND CORONARY ANGIOGRAPHY N/A 07/27/2023   Procedure: RIGHT/LEFT HEART CATH AND CORONARY ANGIOGRAPHY;  Surgeon: Arnoldo Lapping, MD;  Location: [facility] INVASIVE CV LAB;  Service: Cardiovascular;  Laterality: N/A;   ROTATOR CUFF REPAIR W/ DISTAL CLAVICLE EXCISION Left 05/20/2015   SHOULDER ARTHROSCOPY WITH SUBACROMIAL DECOMPRESSION, ROTATOR CUFF REPAIR AND BICEP TENDON REPAIR Left 05/20/2015   Procedure: LEFT SHOULDER ARTHROSCOPY WITH SUBACROMIAL DECOMPRESSION,DISTAL CLAVICAL RESECTION  ROTATOR CUFF REPAIR ;  Surgeon: Ellard Gunning, MD;  Location: [facility] OR;  Service: Orthopedics;  Laterality: Left;   TONSILLECTOMY      Social History:   reports that he quit smoking about 46 years ago. His smoking use included cigarettes. He started smoking about 61 years ago. He has a 15 pack-year smoking history. He has never used smokeless tobacco. He reports current alcohol use of about 10.0 standard drinks of alcohol per week. He reports current drug use. Drug: Marijuana.  Allergies  Allergen Reactions   Ace Inhibitors Other (See Comments)    Angioedema per pt report to admiting MD On 07/31/2019 - lisinopril    Gabapentin Swelling and Other (See Comments)     Influenza-like illness   Peanut Oil Shortness Of Breath    Swelling throat, sob  Other reaction(s): anaphylaxis   Tamiflu [Oseltamivir Phosphate] Shortness Of Breath   Losartan Swelling    Other reaction(s): Lip swelling   Lyrica [Pregabalin] Swelling   Oxcarbazepine Swelling and Other (See Comments)    Lip swelling   Statins Other (See Comments)    Myalgia -- atorvastatin, simvastatin   Avelox [Moxifloxacin Hcl In Nacl] Rash   Ciprofloxacin  Anxiety   Citalopram Anxiety    Other reaction(s): Drowsy, Anxiety   Victoza [Liraglutide] Other (See Comments)    [facility]    Family History  Problem Relation Age of Onset   Hypertension Mother    Alzheimer's disease Mother    Stroke Father    Heart attack Maternal Grandfather 49   Cancer Paternal Grandfather        type unknown   Diabetes Brother    Colon polyps Neg Hx    Colon cancer Neg Hx    Liver cancer  Neg Hx    Stomach cancer Neg Hx    Rectal cancer Neg Hx      Prior to Admission medications   Medication Sig Start Date End Date Taking? Authorizing Provider  acetaminophen  (TYLENOL ) 325 MG tablet Take 2 tablets (650 mg total) by mouth every 6 (six) hours as needed for mild pain (pain score 1-3). 08/10/23   Angela Barban, PA-C  apixaban  (ELIQUIS ) 5 MG TABS tablet Take 5 mg by mouth 2 (two) times daily. 09/27/21   [provider]  aspirin  EC 81 MG tablet Take 1 tablet (81 mg total) by mouth daily. Swallow whole. 08/10/23   Angela Barban, PA-C  Azelastine HCl 137 MCG/SPRAY SOLN Place 1 spray into both nostrils 2 (two) times daily.  06/20/19   [provider]  carvedilol  (COREG ) 12.5 MG tablet Take 1 tablet (12.5 mg total) by mouth  2 (two) times daily. 08/10/23   Angela Barban, PA-C  Cholecalciferol (VITAMIN D) 50 MCG (2000 UT) CAPS Take 1 capsule by mouth every evening.    [provider]  Coenzyme Q10 (COQ10) 100 MG CAPS Take 100 mg by mouth daily.    [provider]  cromolyn  (OPTICROM ) 4 % ophthalmic solution Place 1 drop into both eyes 4 (four) times daily as needed (itchy eyes).    [provider]  fluticasone  (FLONASE ) 50 MCG/ACT nasal spray Place 2 sprays into both nostrils 2 (two) times daily. 09/27/21   [provider]  furosemide  (LASIX ) 20 MG tablet Take 1 tablet (20 mg total) by mouth daily. 08/13/23   Angela Barban, PA-C  Glucosamine-Chondroit-Vit C-Mn (GLUCOSAMINE 1500 COMPLEX PO) Take 1 tablet by mouth every evening.    [provider]  hydrOXYzine  (VISTARIL ) 25 MG capsule Take 25 mg by mouth 2 (two) times daily as needed (for sleep per patient).    [provider]  insulin  aspart (NOVOLOG ) 100 UNIT/ML injection Inject 0-8 Units into the skin 3 (three) times daily as needed for high blood sugar.     [provider]  insulin  glargine (LANTUS ) 100 UNIT/ML injection Inject 20 Units into the skin  2 (two) times daily.    [provider]  MAGNESIUM  OXIDE PO Take 500 mg by mouth at bedtime.    [provider]  Multiple Vitamin (MULTIVITAMIN WITH MINERALS) TABS Take 1 tablet by mouth every evening.     [provider]  neomycin-polymyxin-hydrocortisone (CORTISPORIN) OTIC solution Place 4 drops into the right ear 4 (four) times daily as needed (itchy ears).    [provider]  olopatadine  (PATANOL) 0.1 % ophthalmic solution Place 1 drop into both eyes 2 (two) times daily.    [provider]  OVER THE COUNTER MEDICATION Take 1 capsule by mouth at bedtime. Oil of Oregano    [provider]  oxyCODONE  (OXY IR/ROXICODONE ) 5 MG immediate release tablet Take 1 tablet (5 mg total) by mouth every 6 (six) hours as needed for severe pain (pain score 7-10). 08/10/23   Angela Barban, PA-C  polyethylene glycol powder (GLYCOLAX /MIRALAX ) 17 GM/SCOOP powder Take 34 g by mouth at bedtime. 10/06/19   [provider]  potassium gluconate 595 MG TABS Take 595 mg by mouth every evening.     [provider]  Probiotic Product (PROBIOTIC DAILY PO) Take 2 capsules by mouth every evening.    [provider]  tadalafil (CIALIS) 20 MG tablet Take 20 mg by mouth daily as needed for erectile dysfunction.    [provider]  terazosin  (HYTRIN ) 2 MG capsule Take 2 mg by mouth at bedtime. 12/14/22   [provider]  Tiotropium Bromide  Monohydrate (SPIRIVA  RESPIMAT) 2.5 MCG/ACT AERS Inhale 2 puffs into the lungs daily.    [provider]  traZODone  (DESYREL ) 100 MG tablet Take 100 mg by mouth at bedtime. 06/18/23   [provider]  TURMERIC PO Take 1 tablet by mouth every evening. With Ginger    [provider]  Vitamin A 2400 MCG (8000 UT) CAPS Take 8,000 Units by mouth every evening.    [provider]  vitamin B-12 (CYANOCOBALAMIN) 500 MCG tablet Take 500 mcg by mouth every evening.     [provider]    Physical Exam: Vitals:   08/15/23 0015 08/15/23 0045 08/15/23 0115 08/15/23 0130  BP: (!) 162/85 (!) 157/101 (!) 173/76 (!) 175/81  Pulse:  82 79 97 88  Resp: 19 18 (!) 24 (!) 25  Temp:      TempSrc:      SpO2: 96% 94% 97% 98%    Constitutional: NAD, no pallor or diaphoresis  Eyes: PERTLA, lids and conjunctivae normal  ENMT: Mucous membranes are moist. Posterior pharynx clear of any exudate or lesions.   Neck: supple, no masses  Respiratory: Labored respirations. No wheezing.  Cardiovascular: S1 & S2 heard, regular rate and rhythm. Pretibial pitting edema bilaterally.  Abdomen: No tenderness, soft. Bowel sounds active.  Musculoskeletal: no clubbing / cyanosis. No joint deformity upper and lower extremities.   Skin: no significant rashes, lesions, ulcers. Warm, dry, well-perfused. Neurologic: CN 2-12 grossly intact. Moving all extremities. Alert and oriented.  Psychiatric: Calm. Cooperative.    Labs and Imaging on Admission: I have personally reviewed following labs and imaging studies  CBC: Recent Labs  Lab 08/08/23 0742 08/09/23 0231 08/14/23 2200  WBC 15.4* 15.7* 10.5  HGB 11.8* 10.6* 11.0*  HCT 35.0* 31.5* 34.2*  MCV 87.5 86.5 90.0  PLT 633* 512* 559*   Basic Metabolic Panel: Recent Labs  Lab 08/09/23 0231 08/10/23 0309 08/14/23 2200  NA 124* 125* 128*  K 4.2 4.0 4.1  CL 81* 83* 90*  CO2 33* 33* 27  GLUCOSE 100* 124* 207*  BUN 28* 23 13  CREATININE 0.89 0.86 0.73  CALCIUM  8.4* 8.4* 8.3*   GFR: Estimated Creatinine Clearance: 116.3 mL/min (by C-G formula based on SCr of 0.73 mg/dL). Liver Function Tests: No results for input(s): AST, ALT, ALKPHOS, BILITOT, PROT, ALBUMIN  in the last 168 hours. No results for input(s): LIPASE, AMYLASE in the last 168 hours. No results for input(s): AMMONIA in the last 168 hours. Coagulation Profile: No results for input(s): INR, PROTIME in the last 168 hours. Cardiac  Enzymes: No results for input(s): CKTOTAL, CKMB, CKMBINDEX, TROPONINI in the last 168 hours. BNP (last 3 results) No results for input(s): PROBNP in the last 8760 hours. HbA1C: No results for input(s): HGBA1C in the last 72 hours. CBG: Recent Labs  Lab 08/09/23 1139 08/09/23 1625 08/09/23 2109 08/10/23 0630 08/10/23 1115  GLUCAP 152* 96 198* 106* 144*   Lipid Profile: No results for input(s): CHOL, HDL, LDLCALC, TRIG, CHOLHDL, LDLDIRECT in the last 72 hours. Thyroid Function Tests: No results for input(s): TSH, T4TOTAL, FREET4, T3FREE, THYROIDAB in the last 72 hours. Anemia Panel: No results for input(s): VITAMINB12, FOLATE, FERRITIN, TIBC, IRON, RETICCTPCT in the last 72 hours. Urine analysis:    Component Value Date/Time   COLORURINE YELLOW 08/08/2023 0916   APPEARANCEUR HAZY (A) 08/08/2023 0916   LABSPEC 1.014 08/08/2023 0916   PHURINE 6.0 08/08/2023 0916   GLUCOSEU NEGATIVE 08/08/2023 0916   HGBUR SMALL (A) 08/08/2023 0916   BILIRUBINUR NEGATIVE 08/08/2023 0916   KETONESUR NEGATIVE 08/08/2023 0916   PROTEINUR NEGATIVE 08/08/2023 0916   UROBILINOGEN 1.0 12/21/2012 1341   NITRITE NEGATIVE 08/08/2023 0916   LEUKOCYTESUR LARGE (A) 08/08/2023 0916   Sepsis Labs: @LABRCNTIP (procalcitonin:4,lacticidven:4) ) Recent Results (from the past 240 hours)  Urine Culture (for pregnant, neutropenic or urologic patients or patients with an indwelling urinary catheter)     Status: Abnormal   Collection Time: 08/08/23  6:16 PM   Specimen: Urine, Clean Catch  Result Value Ref Range Status   Specimen Description URINE, CLEAN CATCH  Final   Special Requests NONE  Final   Culture (A)  Final    <10,000 COLONIES/mL INSIGNIFICANT GROWTH Performed at [facility],  [address] [place], [place] [address]    Report Status 08/10/2023 FINAL  Final     Radiological Exams on Admission: CT Angio Chest PE W and/or Wo Contrast Result  Date: 08/15/2023 EXAM: CTA CHEST AORTA 08/15/2023 12:22:29 AM TECHNIQUE: CTA of the chest was performed after the administration of intravenous contrast. Multiplanar reformatted images are provided for review. MIP images are provided for review. Automated exposure control, iterative reconstruction, and/or weight based adjustment of the mA/kV was utilized to reduce the radiation dose to as low as reasonably achievable. COMPARISON: Chest radiograph yesterday and CTA chest dated 07/25/2008. CLINICAL HISTORY: Pulmonary embolism (PE) suspected, high prob. 75cc [id number]; SOB. Pt had a CABG last week on Monday and started having SOB today. Pt was taken off his fluid pill per him. Pt has edema all over. Pt placed on CPAP by EMS for increased work of breathing and swallow resps. FINDINGS: AORTA: No thoracic aortic dissection. No aneurysm. Thoracic aortic atherosclerosis. MEDIASTINUM: No mediastinal lymphadenopathy. The heart and pericardium demonstrate no acute abnormality. Postsurgical changes related to prior CABG. Left atrial appendage clip. Moderate 3-vessel coronary atherosclerosis. LYMPH NODES: No mediastinal, hilar or axillary lymphadenopathy. LUNGS AND PLEURA: No evidence of pulmonary embolism. Small bilateral pleural effusions, partially loculated on the left. No frank interstitial edema. Compressive atelectasis of the bilateral lower lobes. No pneumothorax. UPPER ABDOMEN: Status post cholecystectomy. Atherosclerotic calcifications of the abdominal aorta. SOFT TISSUES AND BONES: Nondisplaced right posterior first rib fracture (image 16). Median sternotomy. Mild degenerative changes of the visualized thoracolumbar spine. IMPRESSION: 1. No evidence of pulmonary embolism. 2. Nondisplaced right posterior 1st rib fracture. No pneumothorax. 3. Small bilateral pleural effusions, partially loculated on the left. No frank interstitial edema. Electronically signed by: Zadie Herter MD 08/15/2023 12:39 AM EDT RP Workstation:  [id number]   DG Chest Portable 1 View Result Date: 08/14/2023 CLINICAL DATA:  sob EXAM: PORTABLE CHEST 1 VIEW COMPARISON:  Chest x-ray 08/09/2023, chest x-ray 08/01/2023 FINDINGS: Stable cardiomediastinal enlargement. The heart and mediastinal contours are unchanged. Left atrial appendage clip. No focal consolidation. Persistent mild pulmonary edema. Possible trace left pleural effusion. No pneumothorax. No acute osseous abnormality.  Sternotomy wires are intact. IMPRESSION: 1. Persistent mild pulmonary edema. 2. Possible trace left pleural effusion. Electronically Signed   By: Morgane  Naveau M.D.   On: 08/14/2023 22:26    EKG: Independently reviewed. Atrial flutter, RBBB.   Assessment/Plan   1. Acute on chronic HFpEF; pleural effusions   - Diurese with IV Lasix , monitor weight and I/Os, monitor renal function and electrolytes, continue Coreg      2. CAD s/p CABG  - S/p CABG on 07/30/23 - No anginal symptoms  - Continue ASA and beta-blocker   3. PAF  - Eliquis , Coreg    4. Type II DM  - Check CBGs, continue long-acting insulin , add correctional sliding scale for now    5. Hyponatremia  - Corrects to 130 when accounting for hyperglycemia  - He is hypervolemic, will monitor daily chem panel while diuresing    6. CPAP; OSA  - Not in exacerbation  - Continue LAMA, continue CPAP while sleeping   7. Anxiety, insomnia  - Continue hydroxyzine  and trazodone     8. Single non-displaced rib fracture  - Pain-control, incentive spirometry    DVT prophylaxis: Eliquis   Code Status: Full  Level of Care: Level of care: Progressive Family Communication: Family updated in ED  Disposition Plan:  Patient is from: SNF  Anticipated d/c is to: SNF Anticipated d/c date is: 08/17/23  Patient currently: Pending improved volume and respiratory status  Consults called: Cardiothoracic surgery  Admission status: Inpatient    Walton Guppy, MD [facility]  08/15/2023, 1:51 AM

## 2023-08-15 NOTE — Plan of Care (Signed)
  Problem: Education: Goal: Ability to describe self-care measures that may prevent or decrease complications (Diabetes Survival Skills Education) will improve Outcome: Progressing Goal: Individualized Educational Video(s) Outcome: Progressing   Problem: Coping: Goal: Ability to adjust to condition or change in health will improve Outcome: Progressing   Problem: Fluid Volume: Goal: Ability to maintain a balanced intake and output will improve Outcome: Progressing   Problem: Health Behavior/Discharge Planning: Goal: Ability to identify and utilize available resources and services will improve Outcome: Progressing Goal: Ability to manage health-related needs will improve Outcome: Progressing   Problem: Metabolic: Goal: Ability to maintain appropriate glucose levels will improve Outcome: Progressing   Problem: Nutritional: Goal: Maintenance of adequate nutrition will improve Outcome: Progressing Goal: Progress toward achieving an optimal weight will improve Outcome: Progressing   Problem: Skin Integrity: Goal: Risk for impaired skin integrity will decrease Outcome: Progressing   Problem: Tissue Perfusion: Goal: Adequacy of tissue perfusion will improve Outcome: Progressing   Problem: Education: Goal: Knowledge of General Education information will improve Description: Including pain rating scale, medication(s)/side effects and non-pharmacologic comfort measures Outcome: Progressing   Problem: Health Behavior/Discharge Planning: Goal: Ability to manage health-related needs will improve Outcome: Progressing   Problem: Clinical Measurements: Goal: Ability to maintain clinical measurements within normal limits will improve Outcome: Progressing Goal: Will remain free from infection Outcome: Progressing Goal: Diagnostic test results will improve Outcome: Progressing Goal: Cardiovascular complication will be avoided Outcome: Progressing   Problem: Activity: Goal: Risk  for activity intolerance will decrease Outcome: Progressing   Problem: Nutrition: Goal: Adequate nutrition will be maintained Outcome: Progressing   Problem: Coping: Goal: Level of anxiety will decrease Outcome: Progressing   Problem: Elimination: Goal: Will not experience complications related to bowel motility Outcome: Progressing Goal: Will not experience complications related to urinary retention Outcome: Progressing   Problem: Pain Managment: Goal: General experience of comfort will improve and/or be controlled Outcome: Progressing   Problem: Safety: Goal: Ability to remain free from injury will improve Outcome: Progressing   Problem: Skin Integrity: Goal: Risk for impaired skin integrity will decrease Outcome: Progressing   Problem: Education: Goal: Ability to demonstrate management of disease process will improve Outcome: Progressing Goal: Ability to verbalize understanding of medication therapies will improve Outcome: Progressing   Problem: Cardiac: Goal: Ability to achieve and maintain adequate cardiopulmonary perfusion will improve Outcome: Progressing   Problem: Clinical Measurements: Goal: Respiratory complications will improve Outcome: Not Progressing   Problem: Activity: Goal: Capacity to carry out activities will improve Outcome: Not Progressing

## 2023-08-15 NOTE — Telephone Encounter (Signed)
 Pt is currently admitted in the hospital, will reach out to pt at a later time to see if pt is interested in the cardiac rehab program.

## 2023-08-15 NOTE — Consult Note (Signed)
 [address]       [name] [address]             [phone]        [name] [facility] Medical Record #[id number] Date of Birth: [birth year]  Referring: Tegeler, Marine Sia, MD (Emergency Dept.) Primary Care: [facility] Primary Cardiologist: None  Chief Complaint: Progressive shortness of breath  History of Present Illness:     [name] is a 73 year old gentleman known to our practice.  He has a past medical history notable for dyslipidemia intolerant of statins, obesity, past history of alcohol and cannabis abuse, hypertension, HFpEF, obstructive sleep apnea, and longstanding paroxysmal atrial fibrillation and atrial flutter on Eliquis , COPD, and type 2 diabetes mellitus.  He was admitted to the hospital on 07/24/2023 being brought to the hospital by EMS for evaluation of acute onset shortness of breath.  He ruled in for non-ST elevation myocardial infarction.  His preoperative echocardiogram showed left ventricular ejection fraction of 55 to 60% with no significant valvular abnormalities.  He went on to have CABG x 3 on 07/30/2023 by Dr. Luna Salinas.  His postoperative course was notable for slow progress with mobility due to balance issues related to his longstanding neuropathy, significant volume excess requiring aggressive diuresis, small bilateral pleural effusions and expected basilar atelectasis.  He was eventually discharged to a skilled nursing facility on 08/10/2023 for continued convalescence and therapy this patient with eventual return to independent living.  [name] was brought to the emergency room from the skilled nursing facility late last night complaining of progressive shortness of breath that had worsened since his discharge along with bilateral lower extremity swelling.  He denied any fever, chest pain, or cough.  He reports taking his medications as prescribed.  His Lasix  was held at discharge and restarted just 2  days ago for hyponatremia noted on labs prior to discharge.  Workup in the emergency room included EKG showing atrial flutter with controlled ventricular response.  Chest x-ray showed a small left pleural effusion and prominent vascular markings bilaterally.  CT chest using PE protocol was negative for pulmonary embolus and confirmed small bilateral pleural effusions with minimal effusions or interstitial congestion.  High-sensitivity troponin was in the 80s and flat.  Sodium was 130, creatinine 0.75, white blood count 11,500 and hematocrit was stable at 34%.  BNP was elevated at 488.  [name] was treated with couple of doses of IV Lasix  since his initial presentation with an approximate 2 L urine output since 07 100 this morning.  He is currently on BiPAP and says his breathing is about the same.  His oxygen  saturation is 99%.  He denies any chest pain and says he has been managing the most operative soreness with acetaminophen .  His appetite has been poor since discharge but he is having bowel movements.  He denies any dysuria    Zubrod Score: At the time of surgery this patient's most appropriate activity status/level should be described as: []     0    Normal activity, no symptoms []     1    Restricted in physical strenuous activity but ambulatory, able to do out light work []     2    Ambulatory and capable of self care, unable to do work activities, up and about                 more than 50%  Of the time                            [  x]    3    Only limited self care, in bed greater than 50% of waking hours []     4    Completely disabled, no self care, confined to bed or chair []     5    Moribund  Past Medical History:  Diagnosis Date   Allergy    Anxiety    Arthritis    Cataract    bilateral lens implants   Clotting disorder (HCC)    DVT in 2006 x1; no problems since   Colon stricture - diverticular (HCC) 09/07/2019   Diabetes mellitus    2   Diverticulitis    Diverticulosis    DVT  (deep venous thrombosis) (HCC) 2007   left leg   Hepatitis 1973   treated at 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   Neuromuscular disorder (HCC)    neuropathy   Non-toxic multinodular goiter 07/28/2023   Other psoriasis 07/28/2023   Paroxysmal atrial fibrillation (HCC) 09/22/2021   Peripheral vascular disease (HCC) 2006   dvt 's   Personal history of colon polyps, unspecified 07/28/2023   Phlebitis and thrombophlebitis of lower extremities 07/28/2023   Aug 28, 2008 Entered By: Loreen Roers Comment: h/o this in 2006     Pneumonia 2006   RBBB    S/P rotator cuff repair 05/20/2015   Sleep apnea    Subdural hematoma (HCC)     Past Surgical History:  Procedure Laterality Date   BRAIN SURGERY  2010   subdural hematoma -bleed6 months after heart cath   CATARACT EXTRACTION, BILATERAL  12/16, 2017   cataract   CHOLECYSTECTOMY N/A 09/08/2020   Procedure: LAPAROSCOPIC CHOLECYSTECTOMY WITH INTRAOPERATIVE CHOLANGIOGRAM;  Surgeon: Dareen Ebbing, MD;  Location: [facility] ORS;  Service: General;  Laterality: N/A;   CLIPPING OF ATRIAL APPENDAGE Left 07/30/2023   Procedure: CLIPPING, LEFT ATRIAL APPENDAGE;  Surgeon: Zelphia Higashi, MD;  Location: [facility] OR;  Service: Open Heart Surgery;  Laterality: Left;   COLONOSCOPY  2014   CORONARY ARTERY BYPASS GRAFT N/A 07/30/2023   Procedure: CORONARY ARTERY BYPASS GRAFTING (CABG) TIMES THREE USING THE LEFT INTERNAL MAMMARY ARTERY AND ENDOSCOPICALLY HARVESTED RIGHT GREATER SAPHENOUS VEIN;  Surgeon: Zelphia Higashi, MD;  Location: [facility] OR;  Service: Open Heart Surgery;  Laterality: N/A;   CYSTOSCOPY WITH INSERTION OF UROLIFT     CYSTOSCOPY WITH INSERTION OF UROLIFT     DG THUMB LEFT HAND Left    gamers    INTRAOPERATIVE TRANSESOPHAGEAL ECHOCARDIOGRAM N/A 07/30/2023   Procedure: ECHOCARDIOGRAM, TRANSESOPHAGEAL,  INTRAOPERATIVE;  Surgeon: Zelphia Higashi, MD;  Location: [facility] OR;  Service: Open Heart Surgery;  Laterality: N/A;   RIGHT/LEFT HEART CATH AND CORONARY ANGIOGRAPHY N/A 07/27/2023   Procedure: RIGHT/LEFT HEART CATH AND CORONARY ANGIOGRAPHY;  Surgeon: Arnoldo Lapping, MD;  Location: [facility] INVASIVE CV LAB;  Service: Cardiovascular;  Laterality: N/A;   ROTATOR CUFF REPAIR W/ DISTAL CLAVICLE EXCISION Left 05/20/2015   SHOULDER ARTHROSCOPY WITH SUBACROMIAL DECOMPRESSION, ROTATOR CUFF REPAIR AND BICEP TENDON REPAIR Left 05/20/2015   Procedure: LEFT SHOULDER ARTHROSCOPY WITH SUBACROMIAL DECOMPRESSION,DISTAL CLAVICAL RESECTION  ROTATOR CUFF REPAIR ;  Surgeon: Ellard Gunning, MD;  Location: [facility] OR;  Service: Orthopedics;  Laterality: Left;   TONSILLECTOMY      Social History   Tobacco Use  Smoking Status Former   Current packs/day: 0.00   Average packs/day: 1 pack/day for 15.0  years (15.0 ttl pk-yrs)   Types: Cigarettes   Start date: 54   Quit date: 1979   Years since quitting: 46.4  Smokeless Tobacco Never    Social History   Substance and Sexual Activity  Alcohol Use Yes   Alcohol/week: 10.0 standard drinks of alcohol   Types: 10 Cans of beer per week   Comment: 3 beers per day     Allergies  Allergen Reactions   Ace Inhibitors Other (See Comments)    Angioedema per pt report to admiting MD On 07/31/2019 - lisinopril    Gabapentin Swelling and Other (See Comments)     Influenza-like illness   Peanut Oil Shortness Of Breath    Swelling throat, sob  Other reaction(s): anaphylaxis   Tamiflu [Oseltamivir Phosphate] Shortness Of Breath   Losartan Swelling    Other reaction(s): Lip swelling   Lyrica [Pregabalin] Swelling   Oxcarbazepine Swelling and Other (See Comments)    Lip swelling   Statins Other (See Comments)    Myalgia -- atorvastatin, simvastatin   Avelox [Moxifloxacin Hcl In Nacl] Rash   Ciprofloxacin  Anxiety   Citalopram Anxiety    Other reaction(s): Drowsy, Anxiety    Victoza [Liraglutide] Other (See Comments)    [facility]    Current Facility-Administered Medications  Medication Dose Route Frequency Provider Last Rate Last Admin   acetaminophen  (TYLENOL ) tablet 650 mg  650 mg Oral Q6H PRN Opyd, Timothy S, MD   650 mg at 08/15/23 0930   Or   acetaminophen  (TYLENOL ) suppository 650 mg  650 mg Rectal Q6H PRN Opyd, Timothy S, MD       apixaban  (ELIQUIS ) tablet 5 mg  5 mg Oral BID Opyd, Timothy S, MD   5 mg at 08/15/23 0930   aspirin  EC tablet 81 mg  81 mg Oral Daily Opyd, Timothy S, MD   81 mg at 08/15/23 0930   carvedilol  (COREG ) tablet 12.5 mg  12.5 mg Oral BID Opyd, Timothy S, MD   12.5 mg at 08/15/23 0930   feeding supplement (ENSURE PLUS HIGH PROTEIN) liquid 237 mL  237 mL Oral BID BM Ezenduka, Nkeiruka J, MD       fentaNYL  (SUBLIMAZE ) injection 12.5-50 mcg  12.5-50 mcg Intravenous Q2H PRN Opyd, Timothy S, MD       furosemide  (LASIX ) injection 40 mg  40 mg Intravenous BID Opyd, Timothy S, MD   40 mg at 08/15/23 9147   insulin  aspart (novoLOG ) injection 0-6 Units  0-6 Units Subcutaneous Q4H Opyd, Timothy S, MD       insulin  glargine-yfgn (SEMGLEE ) injection 20 Units  20 Units Subcutaneous Daily Opyd, Santana Cue, MD       LORazepam  (ATIVAN ) injection 1 mg  1 mg Intravenous Once Ezenduka, Nkeiruka J, MD       oxyCODONE  (Oxy IR/ROXICODONE ) immediate release tablet 5 mg  5 mg Oral Q4H PRN Opyd, Santana Cue, MD       senna-docusate (Senokot-S) tablet 1 tablet  1 tablet Oral QHS PRN Opyd, Timothy S, MD       sodium chloride  flush (NS) 0.9 % injection 3 mL  3 mL Intravenous Q12H Opyd, Timothy S, MD   3 mL at 08/15/23 0931   terazosin  (HYTRIN ) capsule 2 mg  2 mg Oral QHS Opyd, Timothy S, MD       traZODone  (DESYREL ) tablet 100 mg  100 mg Oral QHS Opyd, Timothy S, MD       trimethobenzamide (TIGAN) injection 200 mg  200 mg  Intramuscular Q6H PRN Opyd, Timothy S, MD       umeclidinium bromide  (INCRUSE ELLIPTA ) 62.5 MCG/ACT 1 puff  1 puff Inhalation Daily Opyd,  Santana Cue, MD       Current Outpatient Medications  Medication Sig Dispense Refill   acetaminophen  (TYLENOL ) 325 MG tablet Take 2 tablets (650 mg total) by mouth every 6 (six) hours as needed for mild pain (pain score 1-3). (Patient taking differently: Take 650 mg by mouth every 4 (four) hours as needed for mild pain (pain score 1-3) or moderate pain (pain score 4-6).)     ALPRAZolam  (XANAX ) 0.25 MG tablet Take 0.25 mg by mouth 2 (two) times daily.     apixaban  (ELIQUIS ) 5 MG TABS tablet Take 5 mg by mouth 2 (two) times daily.     aspirin  EC 81 MG tablet Take 1 tablet (81 mg total) by mouth daily. Swallow whole.     Azelastine HCl 137 MCG/SPRAY SOLN Place 1 spray into both nostrils 2 (two) times daily.      carvedilol  (COREG ) 12.5 MG tablet Take 1 tablet (12.5 mg total) by mouth 2 (two) times daily.     Cholecalciferol (VITAMIN D) 50 MCG (2000 UT) CAPS Take 1 capsule by mouth at bedtime.     Coenzyme Q10 (COQ10) 100 MG CAPS Take 100 mg by mouth daily.     cromolyn  (OPTICROM ) 4 % ophthalmic solution Place 1 drop into both eyes every 6 (six) hours as needed (itchy eyes).     fluticasone  (FLONASE ) 50 MCG/ACT nasal spray Place 2 sprays into both nostrils 2 (two) times daily.     furosemide  (LASIX ) 20 MG tablet Take 1 tablet (20 mg total) by mouth daily.     Glucosamine-Chondroit-Vit C-Mn (GLUCOSAMINE 1500 COMPLEX PO) Take 1 tablet by mouth at bedtime.     hydrOXYzine  (VISTARIL ) 25 MG capsule Take 25 mg by mouth 2 (two) times daily as needed (sleep).     insulin  aspart (NOVOLOG ) 100 UNIT/ML injection Inject 0-8 Units into the skin 3 (three) times daily before meals. Inject as per sliding scale: 0-150 = 0 units 151-200 = 2 units 201-250 = 3 units 251-300 = 4 units 301-350 = 6 units 351-400 = 8 units 401-999 = 8 units, Call MD     insulin  glargine (LANTUS ) 100 UNIT/ML injection Inject 20 Units into the skin 2 (two) times daily.     MAGNESIUM  OXIDE PO Take 500 mg by mouth at bedtime.     Multiple  Vitamin (MULTIVITAMIN WITH MINERALS) TABS Take 1 tablet by mouth every evening.      neomycin-polymyxin-hydrocortisone (CORTISPORIN) OTIC solution Place 4 drops into the right ear 4 (four) times daily as needed (itchy ears).     olopatadine  (PATANOL) 0.1 % ophthalmic solution Place 1 drop into both eyes 2 (two) times daily.     oxyCODONE  (OXY IR/ROXICODONE ) 5 MG immediate release tablet Take 1 tablet (5 mg total) by mouth every 6 (six) hours as needed for severe pain (pain score 7-10). 28 tablet 0   polyethylene glycol powder (GLYCOLAX /MIRALAX ) 17 GM/SCOOP powder Take 34 g by mouth at bedtime.     potassium gluconate 595 MG TABS Take 595 mg by mouth at bedtime.     Probiotic Product (PROBIOTIC DAILY PO) Take 2 capsules by mouth daily.     terazosin  (HYTRIN ) 2 MG capsule Take 2 mg by mouth at bedtime.     Tiotropium Bromide  Monohydrate (SPIRIVA  RESPIMAT) 2.5 MCG/ACT AERS Inhale 2 puffs into the lungs  daily.     traZODone  (DESYREL ) 100 MG tablet Take 100 mg by mouth at bedtime.     vitamin B-12 (CYANOCOBALAMIN) 500 MCG tablet Take 500 mcg by mouth every evening.      (Not in a hospital admission)   Family History  Problem Relation Age of Onset   Hypertension Mother    Alzheimer's disease Mother    Stroke Father    Heart attack Maternal Grandfather 64   Cancer Paternal Grandfather        type unknown   Diabetes Brother    Colon polyps Neg Hx    Colon cancer Neg Hx    Liver cancer Neg Hx    Stomach cancer Neg Hx    Rectal cancer Neg Hx      Review of Systems:     Cardiac Review of Systems: Y or  [    ]= no  Chest Pain [    ]  Resting SOB [  x ] Exertional SOB  [ x ]  Orthopnea [  ]   Pedal Edema [ x  ]    Palpitations [  ] Syncope  [  ]   Presyncope [   ]  General Review of Systems: [Y] = yes [  ]=no Constitional: recent weight change [  ]; anorexia [  ]; fatigue [  ]; nausea [  ]; night sweats [  ]; fever [  ]; or chills [  ]                                                                 Eye : blurred vision [  ]; diplopia [   ]; vision changes [  ];  Amaurosis fugax[  ]; Resp: cough [  ];  wheezing[  ];  hemoptysis[  ]; shortness of breath[x  ]; paroxysmal nocturnal dyspnea[  ]; dyspnea on exertion[  ]; or orthopnea[  ];  GI:  gallstones[  ], vomiting[  ];  dysphagia[  ]; melena[  ];  hematochezia [  ]; heartburn[  ];   Hx of  Colonoscopy[  ]; GU: kidney stones [  ]; hematuria[  ];   dysuria [  ];  nocturia[  ];  history of     obstruction [  ]; urinary frequency [  ]             Skin: rash, swelling[  ];, hair loss[  ];  peripheral edema[  ];  or itching[  ]; Musculosketetal: myalgias[  ];  joint swelling[  ];  joint erythema[  ];  joint pain[  ];  back pain[  ];  Heme/Lymph: bruising[  ];  bleeding[  ];  anemia[  ];  Neuro: TIA[  ];  headaches[  ];  stroke[  ];  vertigo[  ];  seizures[  ];   paresthesias[  ];  difficulty walking[  ];  Psych:depression[  ]; anxiety[  ];  Endocrine: diabetes[ x ];  thyroid dysfunction[  ];               Physical Exam: BP (!) 151/77   Pulse 89   Temp (!) 97.4 F (36.3 C) (Axillary)   Resp (!) 21   SpO2 100%    General appearance: alert,  cooperative, and mild distress Head: Normocephalic, without obvious abnormality, atraumatic Neck: no adenopathy, no carotid bruit, no JVD, and supple, symmetrical, trachea midline Resp: BiPAP is in place.  Oxygen  saturation is 99%.  Respirations are unlabored.  Breath sounds are shallow but clear. Cardio: Regular rhythm, monitor shows atrial flutter with a ventricular rate of 90 and 4-1 conduction.  I did not hear a murmur GI: Obese, soft, no apparent tenderness.  Hypoactive bowel sounds Extremities: Cool and dry.  Radial pulses are easily palpable.  I am not able to palpate dorsalis pedis or posterior tibial pulses in either foot.  Capillary refill is 2 to 3 seconds in both lower extremities.  There is significant bruising in the right thigh along the last vein harvest tunnel.  There is no  palpable hematoma or fluid collection.  The right lower extremity incisions are healing with no sign of complication. Neurologic: Grossly normal  Diagnostic Studies & Laboratory data:     Recent Radiology Findings:   CT Angio Chest PE W and/or Wo Contrast Result Date: 08/15/2023 EXAM: CTA CHEST AORTA 08/15/2023 12:22:29 AM TECHNIQUE: CTA of the chest was performed after the administration of intravenous contrast. Multiplanar reformatted images are provided for review. MIP images are provided for review. Automated exposure control, iterative reconstruction, and/or weight based adjustment of the mA/kV was utilized to reduce the radiation dose to as low as reasonably achievable. COMPARISON: Chest radiograph yesterday and CTA chest dated 07/25/2008. CLINICAL HISTORY: Pulmonary embolism (PE) suspected, high prob. 75cc [id number]; SOB. Pt had a CABG last week on Monday and started having SOB today. Pt was taken off his fluid pill per him. Pt has edema all over. Pt placed on CPAP by EMS for increased work of breathing and swallow resps. FINDINGS: AORTA: No thoracic aortic dissection. No aneurysm. Thoracic aortic atherosclerosis. MEDIASTINUM: No mediastinal lymphadenopathy. The heart and pericardium demonstrate no acute abnormality. Postsurgical changes related to prior CABG. Left atrial appendage clip. Moderate 3-vessel coronary atherosclerosis. LYMPH NODES: No mediastinal, hilar or axillary lymphadenopathy. LUNGS AND PLEURA: No evidence of pulmonary embolism. Small bilateral pleural effusions, partially loculated on the left. No frank interstitial edema. Compressive atelectasis of the bilateral lower lobes. No pneumothorax. UPPER ABDOMEN: Status post cholecystectomy. Atherosclerotic calcifications of the abdominal aorta. SOFT TISSUES AND BONES: Nondisplaced right posterior first rib fracture (image 16). Median sternotomy. Mild degenerative changes of the visualized thoracolumbar spine. IMPRESSION: 1. No evidence of  pulmonary embolism. 2. Nondisplaced right posterior 1st rib fracture. No pneumothorax. 3. Small bilateral pleural effusions, partially loculated on the left. No frank interstitial edema. Electronically signed by: Zadie Herter MD 08/15/2023 12:39 AM EDT RP Workstation: [id number]   DG Chest Portable 1 View Result Date: 08/14/2023 CLINICAL DATA:  sob EXAM: PORTABLE CHEST 1 VIEW COMPARISON:  Chest x-ray 08/09/2023, chest x-ray 08/01/2023 FINDINGS: Stable cardiomediastinal enlargement. The heart and mediastinal contours are unchanged. Left atrial appendage clip. No focal consolidation. Persistent mild pulmonary edema. Possible trace left pleural effusion. No pneumothorax. No acute osseous abnormality.  Sternotomy wires are intact. IMPRESSION: 1. Persistent mild pulmonary edema. 2. Possible trace left pleural effusion. Electronically Signed   By: Morgane  Naveau M.D.   On: 08/14/2023 22:26     I have independently reviewed the above radiologic studies and discussed with the patient   Recent Lab Findings: Lab Results  Component Value Date   WBC 11.5 (H) 08/15/2023   HGB 11.0 (L) 08/15/2023   HCT 33.9 (L) 08/15/2023   PLT 569 (H) 08/15/2023   GLUCOSE 141 (  H) 08/15/2023   CHOL 214 (H) 07/25/2023   TRIG 79 07/25/2023   HDL 44 07/25/2023   LDLCALC 154 (H) 07/25/2023   ALT 19 07/24/2023   AST 26 07/24/2023   NA 130 (L) 08/15/2023   K 3.6 08/15/2023   CL 93 (L) 08/15/2023   CREATININE 0.75 08/15/2023   BUN 10 08/15/2023   CO2 28 08/15/2023   TSH 0.806 07/25/2023   INR 1.4 (H) 07/30/2023   HGBA1C 6.2 (H) 07/24/2023      Assessment / Plan:      -73 year old male with longstanding HFpEF, COPD, obstructive sleep apnea on BiPAP, past history of alcohol and cannabis abuse, type 2 diabetes mellitus, persistent rate-controlled atrial fibrillation/atrial flutter on Eliquis , recent non-ST elevation myocardial infarction on 07/25/2023 with subsequent CABG x 3 on 07/30/2023 by Dr. Luna Salinas.  She was  discharged to a skilled nursing facility 5 days ago after a 2-week hospital course notable for volume excess requiring aggressive diuresis, persistent atrial flutter, slow progress with mobilization due to imbalance and longstanding neuropathy.  He was asked to hold off on his diuretic for a few days at the time of discharge on 08/10/2023 due to hyponatremia and thus had just resumed his Lasix  2 days ago.  He was brought to the emergency room yesterday with progressive shortness of breath likely related to volume excess.  He has been given Lasix  IV x 2 doses since his arrival with approximately 2 L of diuresis in the past 4 hours.  He is oxygenating well on BiPAP and not having any chest pain. His admission chest x-ray and CT scan were reviewed showing prominent vascular markings but no overt pulmonary edema and only small effusions with associated atelectasis.  The high-sensitivity troponins did not indicate any significant acute myocardial injury.  Agree with admission to the hospital for aggressive diuresis and optimization of his heart failure management.  Will request an echocardiogram to reassess ventricular function.  We will follow with you.   I  spent 25 minutes counseling the patient face to face.   Syna Gad G. Karthikeya Funke, PA-C  08/15/2023 11:02 AM

## 2023-08-15 NOTE — Progress Notes (Signed)
 PROGRESS NOTE  [name]. NUU:[id number] DOB: Jun 26, 1950 DOA: 08/14/2023 PCP: [facility]  HPI/Recap of past 24 hours: [name]. is a 73 y.o. 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 Pt was told to hold his lasix  2/2 ?hyponatremia prior to d/c to SNF. Pt was placed on CPAP with EMS. Upon arrival to the ED, patient is found to be afebrile, on CPAP with tachypnea, elevated BP.  Labs are most notable for sodium 128, troponin 83, and BNP 489.  CTA chest is negative for PE but notable for nondisplaced right posterior first rib fracture and small bilateral pleural effusions with partial loculation on the left. Cardiothoracic surgery was consulted by the EDP.  Patient admitted for further management.    Today, patient still complaining of shortness of breath, denies any chest pain, nausea/vomiting, fever/chills.  Does report some anxiety as well.   Assessment/Plan: Principal Problem:   Acute on chronic diastolic CHF (congestive heart failure) (HCC) Active Problems:   Essential hypertension, benign   Type 2 diabetes mellitus with hyperglycemia, with long-term current use of insulin  (HCC)   Sleep apnea   Hyponatremia   COPD (chronic obstructive pulmonary disease) (HCC)   Coronary artery disease involving native coronary artery of native heart without angina pectoris   Paroxysmal atrial fibrillation (HCC)   Generalized anxiety disorder   Acute on chronic HFpEF; pleural effusions   Currently on about 5 L of O2, alternating with BiPAP BNP 488.7, troponin 82->80, flat trend EKG noted atrial flutter, rate controlled, noted QTc prolongation Last echo 06/2023 with preserved EF, grade 2 diastolic function, with regional wall  motion abnormality CTA chest with no evidence of PE, no pneumothorax, small bilateral pleural effusion, no frank interstitial edema Continue diuresing with IV Lasix , Coreg  Cardiothoracic surgery consulted for further management Monitor weight and I/Os, monitor renal function and electrolytes   CAD s/p CABG  S/p CABG on 07/30/23 No anginal symptoms  Continue ASA and beta-blocker    PAF QTc prolongation EKG showed atrial flutter, QTc prolongation Continue Eliquis , Coreg     Diabetes mellitus type 2 Last A1c 6.2 on 06/2023 SSI, Accu-Cheks, Semglee , hypoglycemic protocol   Hyponatremia  Improving Daily BMP   OSA on CPAP Continue CPAP while sleeping    Anxiety, insomnia  Continue ativan  and trazodone      Single non-displaced rib fracture  Pain-control, incentive spirometry   Class I obesity Lifestyle modification advised    Estimated body mass index is 34.91 kg/m as calculated from the following:   Height as of this encounter: 6' 2 (1.88 m).   Weight as of this encounter: 123.3 kg.     Code Status: Full  Family Communication: None at bedside  Disposition Plan: Status is: Inpatient Remains inpatient appropriate because: Level of care      Consultants: Cardiothoracic surgery  Procedures: None  Antimicrobials: None  DVT prophylaxis: Eliquis    Objective: Vitals:   08/15/23 0930 08/15/23 1235 08/15/23 1236 08/15/23 1316  BP: (!) 151/77 122/68  (!) 168/83  Pulse: 89 70  93  Resp: (!) 21 14  (!) 22  Temp:   97.7 F (36.5 C) 98.3 F (36.8 C)  TempSrc:   Oral Oral  SpO2: 100% 98%  100%  Weight:  123.3 kg  Height:    6' 2 (1.88 m)    Intake/Output Summary (Last 24 hours) at 08/15/2023 1332 Last data filed at 08/15/2023 1320 Gross per 24 hour  Intake --  Output 2700 ml  Net -2700 ml   Filed Weights   08/15/23 1316  Weight: 123.3 kg    Exam: General: NAD  Cardiovascular: S1, S2 present Respiratory: Bibasilar crackles noted Abdomen: Soft,  nontender, nondistended, bowel sounds present Musculoskeletal: ++bilateral pedal edema noted Skin: Noted bruising on right lower extremity thigh around vein harvest sites Psychiatry: Normal mood     Data Reviewed: CBC: Recent Labs  Lab 08/09/23 0231 08/14/23 2200 08/15/23 0647  WBC 15.7* 10.5 11.5*  HGB 10.6* 11.0* 11.0*  HCT 31.5* 34.2* 33.9*  MCV 86.5 90.0 90.9  PLT 512* 559* 569*   Basic Metabolic Panel: Recent Labs  Lab 08/09/23 0231 08/10/23 0309 08/14/23 2200 08/15/23 0647  NA 124* 125* 128* 130*  K 4.2 4.0 4.1 3.6  CL 81* 83* 90* 93*  CO2 33* 33* 27 28  GLUCOSE 100* 124* 207* 141*  BUN 28* 23 13 10   CREATININE 0.89 0.86 0.73 0.75  CALCIUM  8.4* 8.4* 8.3* 7.9*  MG  --   --   --  2.1   GFR: Estimated Creatinine Clearance: 116.4 mL/min (by C-G formula based on SCr of 0.75 mg/dL). Liver Function Tests: No results for input(s): AST, ALT, ALKPHOS, BILITOT, PROT, ALBUMIN  in the last 168 hours. No results for input(s): LIPASE, AMYLASE in the last 168 hours. No results for input(s): AMMONIA in the last 168 hours. Coagulation Profile: No results for input(s): INR, PROTIME in the last 168 hours. Cardiac Enzymes: No results for input(s): CKTOTAL, CKMB, CKMBINDEX, TROPONINI in the last 168 hours. BNP (last 3 results) No results for input(s): PROBNP in the last 8760 hours. HbA1C: No results for input(s): HGBA1C in the last 72 hours. CBG: Recent Labs  Lab 08/10/23 0630 08/10/23 1115 08/15/23 0224 08/15/23 0809 08/15/23 1207  GLUCAP 106* 144* 164* 128* 155*   Lipid Profile: No results for input(s): CHOL, HDL, LDLCALC, TRIG, CHOLHDL, LDLDIRECT in the last 72 hours. Thyroid Function Tests: No results for input(s): TSH, T4TOTAL, FREET4, T3FREE, THYROIDAB in the last 72 hours. Anemia Panel: No results for input(s): VITAMINB12, FOLATE, FERRITIN, TIBC, IRON, RETICCTPCT in the last 72 hours. Urine  analysis:    Component Value Date/Time   COLORURINE YELLOW 08/08/2023 0916   APPEARANCEUR HAZY (A) 08/08/2023 0916   LABSPEC 1.014 08/08/2023 0916   PHURINE 6.0 08/08/2023 0916   GLUCOSEU NEGATIVE 08/08/2023 0916   HGBUR SMALL (A) 08/08/2023 0916   BILIRUBINUR NEGATIVE 08/08/2023 0916   KETONESUR NEGATIVE 08/08/2023 0916   PROTEINUR NEGATIVE 08/08/2023 0916   UROBILINOGEN 1.0 12/21/2012 1341   NITRITE NEGATIVE 08/08/2023 0916   LEUKOCYTESUR LARGE (A) 08/08/2023 0916   Sepsis Labs: @LABRCNTIP (procalcitonin:4,lacticidven:4)  ) Recent Results (from the past 240 hours)  Urine Culture (for pregnant, neutropenic or urologic patients or patients with an indwelling urinary catheter)     Status: Abnormal   Collection Time: 08/08/23  6:16 PM   Specimen: Urine, Clean Catch  Result Value Ref Range Status   Specimen Description URINE, CLEAN CATCH  Final   Special Requests NONE  Final   Culture (A)  Final    <10,000 COLONIES/mL INSIGNIFICANT GROWTH Performed at [facility], [address], [place], [place] [address]    Report Status 08/10/2023 FINAL  Final      Studies: CT Angio  Chest PE W and/or Wo Contrast Result Date: 08/15/2023 EXAM: CTA CHEST AORTA 08/15/2023 12:22:29 AM TECHNIQUE: CTA of the chest was performed after the administration of intravenous contrast. Multiplanar reformatted images are provided for review. MIP images are provided for review. Automated exposure control, iterative reconstruction, and/or weight based adjustment of the mA/kV was utilized to reduce the radiation dose to as low as reasonably achievable. COMPARISON: Chest radiograph yesterday and CTA chest dated 07/25/2008. CLINICAL HISTORY: Pulmonary embolism (PE) suspected, high prob. 75cc [id number]; SOB. Pt had a CABG last week on Monday and started having SOB today. Pt was taken off his fluid pill per him. Pt has edema all over. Pt placed on CPAP by EMS for increased work of breathing and swallow resps.  FINDINGS: AORTA: No thoracic aortic dissection. No aneurysm. Thoracic aortic atherosclerosis. MEDIASTINUM: No mediastinal lymphadenopathy. The heart and pericardium demonstrate no acute abnormality. Postsurgical changes related to prior CABG. Left atrial appendage clip. Moderate 3-vessel coronary atherosclerosis. LYMPH NODES: No mediastinal, hilar or axillary lymphadenopathy. LUNGS AND PLEURA: No evidence of pulmonary embolism. Small bilateral pleural effusions, partially loculated on the left. No frank interstitial edema. Compressive atelectasis of the bilateral lower lobes. No pneumothorax. UPPER ABDOMEN: Status post cholecystectomy. Atherosclerotic calcifications of the abdominal aorta. SOFT TISSUES AND BONES: Nondisplaced right posterior first rib fracture (image 16). Median sternotomy. Mild degenerative changes of the visualized thoracolumbar spine. IMPRESSION: 1. No evidence of pulmonary embolism. 2. Nondisplaced right posterior 1st rib fracture. No pneumothorax. 3. Small bilateral pleural effusions, partially loculated on the left. No frank interstitial edema. Electronically signed by: Zadie Herter MD 08/15/2023 12:39 AM EDT RP Workstation: [id number]   DG Chest Portable 1 View Result Date: 08/14/2023 CLINICAL DATA:  sob EXAM: PORTABLE CHEST 1 VIEW COMPARISON:  Chest x-ray 08/09/2023, chest x-ray 08/01/2023 FINDINGS: Stable cardiomediastinal enlargement. The heart and mediastinal contours are unchanged. Left atrial appendage clip. No focal consolidation. Persistent mild pulmonary edema. Possible trace left pleural effusion. No pneumothorax. No acute osseous abnormality.  Sternotomy wires are intact. IMPRESSION: 1. Persistent mild pulmonary edema. 2. Possible trace left pleural effusion. Electronically Signed   By: Morgane  Naveau M.D.   On: 08/14/2023 22:26    Scheduled Meds:  apixaban   5 mg Oral BID   aspirin  EC  81 mg Oral Daily   carvedilol   12.5 mg Oral BID   feeding supplement  237 mL Oral  BID BM   furosemide   40 mg Intravenous BID   insulin  aspart  0-6 Units Subcutaneous Q4H   insulin  glargine-yfgn  20 Units Subcutaneous Daily   sodium chloride  flush  3 mL Intravenous Q12H   terazosin   2 mg Oral QHS   traZODone   100 mg Oral QHS   umeclidinium bromide   1 puff Inhalation Daily    Continuous Infusions:   LOS: 0 days     Veronica Gordon, MD [facility]  If 7PM-7AM, please contact night-coverage [email or web address] 08/15/2023, 1:32 PM

## 2023-08-15 NOTE — ED Notes (Signed)
Patient is resting comfortably in NAD at this time.

## 2023-08-15 NOTE — ED Notes (Signed)
Hook patient back up to the monitor patient is resting with call bell in reach

## 2023-08-15 NOTE — Progress Notes (Signed)
 Heart Failure Navigator Progress Note  Assessed for Heart & Vascular TOC clinic readiness.  Patient does not meet criteria due to EF 55-60%, has a scheduled [facility] appointment on 08/20/2023. No HF TOC. .   Navigator will sign off at this time.   Randie Bustle, BSN, Scientist, clinical (histocompatibility and immunogenetics) Only

## 2023-08-16 DIAGNOSIS — I5033 Acute on chronic diastolic (congestive) heart failure: Secondary | ICD-10-CM | POA: Diagnosis not present

## 2023-08-16 LAB — GLUCOSE, CAPILLARY
Glucose-Capillary: 111 mg/dL — ABNORMAL HIGH (ref 70–99)
Glucose-Capillary: 167 mg/dL — ABNORMAL HIGH (ref 70–99)
Glucose-Capillary: 139 mg/dL — ABNORMAL HIGH (ref 70–99)
Glucose-Capillary: 170 mg/dL — ABNORMAL HIGH (ref 70–99)
Glucose-Capillary: 195 mg/dL — ABNORMAL HIGH (ref 70–99)

## 2023-08-16 LAB — BASIC METABOLIC PANEL WITH GFR
Anion gap: 7 (ref 5–15)
BUN: 11 mg/dL (ref 8–23)
CO2: 32 mmol/L (ref 22–32)
Calcium: 8.3 mg/dL — ABNORMAL LOW (ref 8.9–10.3)
Chloride: 91 mmol/L — ABNORMAL LOW (ref 98–111)
Creatinine, Ser: 0.8 mg/dL (ref 0.61–1.24)
GFR, Estimated: >60 mL/min (ref >60)
Glucose, Bld: 107 mg/dL — ABNORMAL HIGH (ref 70–99)
Potassium: 3.6 mmol/L (ref 3.5–5.1)
Sodium: 130 mmol/L — ABNORMAL LOW (ref 135–145)

## 2023-08-16 LAB — IRON AND TIBC
Iron: 34 ug/dL — ABNORMAL LOW (ref 45–182)
Saturation Ratios: 11 % — ABNORMAL LOW (ref 17.9–39.5)
TIBC: 305 ug/dL (ref 250–450)
UIBC: 271 ug/dL

## 2023-08-16 LAB — FOLATE: Folate: >40 ng/mL (ref >5.9)

## 2023-08-16 LAB — CBC
HCT: 32.1 % — ABNORMAL LOW (ref 39.0–52.0)
Hemoglobin: 10.7 g/dL — ABNORMAL LOW (ref 13.0–17.0)
MCH: 29.9 pg (ref 26.0–34.0)
MCHC: 33.3 g/dL (ref 30.0–36.0)
MCV: 89.7 fL (ref 80.0–100.0)
Platelets: 513 10*3/uL — ABNORMAL HIGH (ref 150–400)
RBC: 3.58 MIL/uL — ABNORMAL LOW (ref 4.22–5.81)
RDW: 13.8 % (ref 11.5–15.5)
WBC: 9.4 10*3/uL (ref 4.0–10.5)
nRBC: 0 % (ref 0.0–0.2)

## 2023-08-16 LAB — VITAMIN B12: Vitamin B-12: 975 pg/mL — ABNORMAL HIGH (ref 180–914)

## 2023-08-16 LAB — FERRITIN: Ferritin: 99 ng/mL (ref 24–336)

## 2023-08-16 MED ORDER — IRON SUCROSE 200 MG IVPB - SIMPLE MED
200.0000 mg | Freq: Once | Status: DC
  Filled 2023-08-16: qty 110

## 2023-08-16 MED ORDER — IPRATROPIUM-ALBUTEROL 0.5-2.5 (3) MG/3ML IN SOLN
3.0000 mL | Freq: Three times a day (TID) | RESPIRATORY_TRACT | Status: AC
  Administered 2023-08-17 – 2023-08-18 (×5): 3 mL via RESPIRATORY_TRACT
  Filled 2023-08-16 (×7): qty 3

## 2023-08-16 MED ORDER — HYDROXYZINE HCL 25 MG PO TABS
25.0000 mg | ORAL_TABLET | Freq: Two times a day (BID) | ORAL | Status: DC | PRN
  Administered 2023-08-16 – 2023-08-26 (×8): 25 mg via ORAL
  Filled 2023-08-16 (×8): qty 1

## 2023-08-16 MED ORDER — HYDROXYZINE PAMOATE 25 MG PO CAPS: 25.0000 mg | ORAL_CAPSULE | Freq: Two times a day (BID) | ORAL | Status: DC | PRN

## 2023-08-16 MED ORDER — IPRATROPIUM-ALBUTEROL 0.5-2.5 (3) MG/3ML IN SOLN
3.0000 mL | Freq: Four times a day (QID) | RESPIRATORY_TRACT | Status: DC
  Administered 2023-08-16: 3 mL via RESPIRATORY_TRACT
  Filled 2023-08-16: qty 3

## 2023-08-16 MED ORDER — FERROUS GLUCONATE 324 (38 FE) MG PO TABS
324.0000 mg | ORAL_TABLET | Freq: Every day | ORAL | Status: DC
  Administered 2023-08-17 – 2023-08-27 (×11): 324 mg via ORAL
  Filled 2023-08-16 (×11): qty 1

## 2023-08-16 MED ORDER — SODIUM CHLORIDE 0.9 % IV SOLN
200.0000 mg | Freq: Once | INTRAVENOUS | Status: AC
  Administered 2023-08-16: 200 mg via INTRAVENOUS
  Filled 2023-08-16: qty 10

## 2023-08-16 NOTE — Evaluation (Signed)
 Physical Therapy Evaluation Patient Details Name: [name]. MRN: [id number] DOB: Nov 12, 1950 Today's Date: 08/16/2023  History of Present Illness  Thelbert Gartin. is a 73 y.o. male admitted 08/14/23 for CHF exacerbation. PMHx: HTN, T2DM, chronic diastolic CHF, PAF on Eliquis , obesity, OSA, and CAD s/p CABG x3 on 07/30/2023.  Clinical Impression  Pt admitted with above diagnosis. PTA, pt was receiving therapy services at [facility]. His PLOF was independent with functional mobility without an AD, working-part time, independent with ADLs, driving, and caring for his wife with Parkinson's. Pt currently with functional limitations due to the deficits listed below (see PT Problem List). He required minA for bed mobility, CGA for sit<>stand using RW, and CGA for gait using RW, and cues to adhere to sternal precautions throughout session. Pt ambulated ~141ft with increased WOB and limited chest excursion. His SpO2 >93% throughout activity on 3L O2 via [place]. Educated pt on incentive spirometer and encouraged him to perform 10 reps every hour. Pt will benefit from acute skilled PT to increase his independence and safety with mobility to allow discharge. Recommend return to post-acute rehab of >3 hours/day of therapy.     If plan is discharge home, recommend the following: Assistance with cooking/housework;Assist for transportation;Help with stairs or ramp for entrance;A little help with walking and/or transfers;A little help with bathing/dressing/bathroom   Can travel by private vehicle   Yes    Equipment Recommendations None recommended by PT  Recommendations for Other Services       Functional Status Assessment Patient has had a recent decline in their functional status and demonstrates the ability to make significant improvements in function in a reasonable and predictable amount of time.     Precautions / Restrictions Precautions Precautions: Sternal;Fall Precaution Booklet Issued: No (Pt  reports having it from last admission back at the SNF) Recall of Precautions/Restrictions: Impaired Precaution/Restrictions Comments: Pt remembered move in the tube, but required cues for adherence and to place hands on thighs when sitting/standing/scooting. He brought his heart pillow with him. Restrictions Weight Bearing Restrictions Per Provider Order: No      Mobility  Bed Mobility Overal bed mobility: Needs Assistance Bed Mobility: Rolling, Sidelying to Sit, Sit to Sidelying Rolling: Contact guard assist Sidelying to sit: Min assist, HOB elevated     Sit to sidelying: Min assist, HOB elevated General bed mobility comments: Pt sat up on L side of bed with increased time. Rolled onto his L side by pushing with RLE and CGA at trunk/pelvis. Pt brought BLE off EOB and needed assist to elevate trunk. Pt able to scoot fwd with cues to keep hands on thighs. Returning to bed pt required assist to bring BLE into bed. Able to reposition himself in the center.    Transfers Overall transfer level: Needs assistance Equipment used: Rolling walker (2 wheels) Transfers: Sit to/from Stand Sit to Stand: From elevated surface, Contact guard assist           General transfer comment: Pt stood from raised bed height. He demonstrated proper hand placement using RW. CGA to power up. Good eccentric contorl with sitting.    Ambulation/Gait Ambulation/Gait assistance: Contact guard assist Gait Distance (Feet): 125 Feet Assistive device: Rolling walker (2 wheels) Gait Pattern/deviations: Step-through pattern, Trunk flexed Gait velocity: decreased Gait velocity interpretation: <1.8 ft/sec, indicate of risk for recurrent falls   General Gait Details: Pt ambulated within the room and hallway. He manuevered the RW well, navigating obstacles. No overt LOB. Increased WOB  noted with pt taking short shallow breaths. He required intermittent standing rest breaks with cues for PLB to recover.  Stairs             Wheelchair Mobility     Tilt Bed    Modified Rankin (Stroke Patients Only)       Balance Overall balance assessment: Needs assistance Sitting-balance support: Feet supported Sitting balance-Leahy Scale: Good Sitting balance - Comments: Pt sat EOB with supervision   Standing balance support: Bilateral upper extremity supported, During functional activity, Reliant on assistive device for balance Standing balance-Leahy Scale: Poor Standing balance comment: Pt dependent on RW and CGA for stability                             Pertinent Vitals/Pain Pain Assessment Pain Assessment: Faces Faces Pain Scale: Hurts little more Pain Location: chest Pain Descriptors / Indicators: Pressure Pain Intervention(s): Monitored during session    Home Living Family/patient expects to be discharged to:: Skilled nursing facility ([facility]) Living Arrangements: Spouse/significant other Available Help at Discharge: Family;Available 24 hours/day Type of Home: House Home Access: Stairs to enter Entrance Stairs-Rails: None Entrance Stairs-Number of Steps: 1   Home Layout: One level Home Equipment: Hand held shower head;Shower Counsellor (2 wheels);Rollator (4 wheels);Cane - quad;Cane - single point      Prior Function Prior Level of Function : Independent/Modified Independent;Driving;Working/employed             Mobility Comments: Independent, ambulates without AD. Able to negotiate 3 flights of stairs at work with increased time and some difficultlty. Denies falls. 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 Indep with ADLs. Cares for his wife with Parkinson's, providing PRN physical assist.     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 Placed pt on 3L and SpO2 >93% throughout session. He has ecchymosis along RLE, healing sternal incision, and two healing incisions along RLE.    Exercises Other Exercises Other Exercises: Reviewed incentive spirometer education. Instructed pt to complete 10 reps every hour.   Assessment/Plan    PT Assessment Patient needs continued PT services  PT Problem List Decreased strength;Decreased activity tolerance;Decreased balance;Decreased mobility;Decreased knowledge of use of DME;Decreased knowledge of precautions;Cardiopulmonary status limiting activity;Impaired sensation;Decreased skin integrity;Obesity;Pain       PT Treatment Interventions DME instruction;Gait training;Functional mobility training;Therapeutic activities;Therapeutic exercise;Balance training;Patient/family education;Stair training    PT Goals (Current goals can be found in the Care Plan section)  Acute Rehab PT Goals Patient Stated Goal: Return to rehab PT Goal Formulation: With patient Time For Goal Achievement: 08/30/23 Potential to Achieve Goals: Good    Frequency Min 2X/week  Co-evaluation               AM-PAC PT 6 Clicks Mobility  Outcome Measure Help needed turning from your back to your side while in a flat bed without using bedrails?: A Little Help needed moving from lying on your back to sitting on the side  of a flat bed without using bedrails?: A Little Help needed moving to and from a bed to a chair (including a wheelchair)?: A Little Help needed standing up from a chair using your arms (e.g., wheelchair or bedside chair)?: A Little Help needed to walk in hospital room?: A Little Help needed climbing 3-5 steps with a railing? : A Lot 6 Click Score: 17    End of Session Equipment Utilized During Treatment: Gait belt;Oxygen  Activity Tolerance: Patient tolerated treatment well Patient left: in bed;with call bell/phone within reach;with bed alarm set Nurse Communication: Mobility status PT Visit Diagnosis: Difficulty in walking, not elsewhere classified (R26.2);Other abnormalities of gait and mobility (R26.89)    Time: 1418-1500 PT Time Calculation (min) (ACUTE ONLY): 42 min   Charges:   PT Evaluation $PT Eval Moderate Complexity: 1 Mod PT Treatments $Gait Training: 8-22 mins PT General Charges $$ ACUTE PT VISIT: 1 Visit         Glenford Lanes, PT, DPT Acute Rehabilitation Services Office: [phone] Secure Chat Preferred  Riva Chester 08/16/2023, 4:12 PM

## 2023-08-16 NOTE — Progress Notes (Signed)
 PROGRESS NOTE  [name]. ZOX:[id number] DOB: [birth year] DOA: 08/14/2023 PCP: [facility]  HPI/Recap of past 24 hours: Alan Casher. is a 73 y.o. 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 Pt was told to hold his lasix  2/2 ?hyponatremia prior to d/c to SNF. Pt was placed on CPAP with EMS. Upon arrival to the ED, patient is found to be afebrile, on CPAP with tachypnea, elevated BP.  Labs are most notable for sodium 128, troponin 83, and BNP 489.  CTA chest is negative for PE but notable for nondisplaced right posterior first rib fracture and small bilateral pleural effusions with partial loculation on the left. Cardiothoracic surgery was consulted by the EDP.  Patient admitted for further management.    Today, pt still complaining of subjective shortness of breath, denies any chest pain, abdominal pain, nausea/vomiting, fever/chills.  Feels more comfortable using his CPAP machine throughout the day and night   Assessment/Plan: Principal Problem:   Acute on chronic diastolic CHF (congestive heart failure) (HCC) Active Problems:   Essential hypertension, benign   Type 2 diabetes mellitus with hyperglycemia, with long-term current use of insulin  (HCC)   Sleep apnea   Hyponatremia   COPD (chronic obstructive pulmonary disease) (HCC)   Coronary artery disease involving native coronary artery of native heart without angina pectoris   Paroxysmal atrial fibrillation (HCC)   Generalized anxiety disorder   Acute on chronic HFpEF; pleural effusions   Currently on CPAP, alternating with 3 L of O2 nasal cannula BNP 488.7, troponin 82->80, flat trend EKG noted atrial flutter, rate controlled, noted QTc prolongation Repeat echo with  EF of 50 to 55%, noted regional wall motion abnormality CTA chest with no evidence of PE, no pneumothorax, small bilateral pleural effusion, no frank interstitial edema Continue diuresing with IV Lasix , Coreg  Cardiothoracic surgery consulted for further management Plan to consult cardiology if no significant improvement Monitor weight and I/Os, monitor renal function and electrolytes   CAD s/p CABG  S/p CABG on 07/30/23 No anginal symptoms  Continue ASA and beta-blocker    PAF QTc prolongation EKG showed atrial flutter, QTc prolongation Continue Eliquis , Coreg    Normocytic anemia Hemoglobin around baseline Anemia panel showed iron 34, sats 11 Gave 1 dose of IV iron on 6/19, start oral iron supplementation Daily CBC   Diabetes mellitus type 2 Last A1c 6.2 on 06/2023 SSI, Accu-Cheks, Semglee , hypoglycemic protocol   Hyponatremia  Improving Daily BMP   OSA on CPAP Continue CPAP   Anxiety, insomnia  Continue ativan  and trazodone      Single non-displaced rib fracture  Pain-control, incentive spirometry   Class I obesity Lifestyle modification advised    Estimated body mass index is 34.91 kg/m as calculated from the following:   Height as of this encounter: 6' 2 (1.88 m).   Weight as of this encounter: 123.3 kg.     Code Status: Full  Family Communication: Discussed with daughter on 08/15/2023  Disposition Plan: Status is: Inpatient Remains inpatient appropriate because: Level of care      Consultants: Cardiothoracic surgery  Procedures: None  Antimicrobials: None  DVT prophylaxis: Eliquis    Objective: Vitals:   08/16/23 0649 08/16/23 0740 08/16/23 1230 08/16/23 1607  BP:  103/62 107/85 135/70  Pulse:  87 86 79  Resp: (!) 21 (!) 25 20 17   Temp:  97.7 F (36.5 C) 98.2 F (36.8 C) 97.6 F (36.4 C)  TempSrc:  Oral Oral Oral  SpO2:  100% 99% 98%  Weight:      Height:        Intake/Output Summary (Last 24 hours) at 08/16/2023 1651 Last data  filed at 08/16/2023 1230 Gross per 24 hour  Intake 1070 ml  Output 1550 ml  Net -480 ml   Filed Weights   08/15/23 1316  Weight: 123.3 kg    Exam: General: NAD  Cardiovascular: S1, S2 present Respiratory: Bibasilar crackles noted Abdomen: Soft, nontender, nondistended, bowel sounds present Musculoskeletal: ++bilateral pedal edema noted Skin: Noted bruising on right lower extremity thigh around vein harvest sites Psychiatry: Normal mood     Data Reviewed: CBC: Recent Labs  Lab 08/14/23 2200 08/15/23 0647 08/16/23 0328  WBC 10.5 11.5* 9.4  HGB 11.0* 11.0* 10.7*  HCT 34.2* 33.9* 32.1*  MCV 90.0 90.9 89.7  PLT 559* 569* 513*   Basic Metabolic Panel: Recent Labs  Lab 08/10/23 0309 08/14/23 2200 08/15/23 0647 08/16/23 0328  NA 125* 128* 130* 130*  K 4.0 4.1 3.6 3.6  CL 83* 90* 93* 91*  CO2 33* 27 28 32  GLUCOSE 124* 207* 141* 107*  BUN 23 13 10 11   CREATININE 0.86 0.73 0.75 0.80  CALCIUM  8.4* 8.3* 7.9* 8.3*  MG  --   --  2.1  --    GFR: Estimated Creatinine Clearance: 116.4 mL/min (by C-G formula based on SCr of 0.8 mg/dL). Liver Function Tests: No results for input(s): AST, ALT, ALKPHOS, BILITOT, PROT, ALBUMIN  in the last 168 hours. No results for input(s): LIPASE, AMYLASE in the last 168 hours. No results for input(s): AMMONIA in the last 168 hours. Coagulation Profile: No results for input(s): INR, PROTIME in the last 168 hours. Cardiac Enzymes: No results for input(s): CKTOTAL, CKMB, CKMBINDEX, TROPONINI in the last 168 hours. BNP (last 3 results) No results for input(s): PROBNP in the last 8760 hours. HbA1C: No results for input(s): HGBA1C in the last 72 hours. CBG: Recent Labs  Lab 08/15/23 2025 08/15/23 2345 08/16/23 0447 08/16/23 1011 08/16/23 1202  GLUCAP 143* 132* 111* 167* 139*   Lipid Profile: No results for input(s): CHOL, HDL, LDLCALC, TRIG, CHOLHDL, LDLDIRECT in the last 72  hours. Thyroid Function Tests: No results for input(s): TSH, T4TOTAL, FREET4, T3FREE, THYROIDAB in the last 72 hours. Anemia Panel: Recent Labs    08/16/23 0328  VITAMINB12 975*  FOLATE >40.0  FERRITIN 99  TIBC 305  IRON 34*   Urine analysis:    Component Value Date/Time   COLORURINE YELLOW 08/08/2023 0916   APPEARANCEUR HAZY (A) 08/08/2023 0916   LABSPEC 1.014 08/08/2023 0916   PHURINE 6.0 08/08/2023 0916   GLUCOSEU NEGATIVE 08/08/2023 0916   HGBUR SMALL (A) 08/08/2023 0916   BILIRUBINUR NEGATIVE 08/08/2023 0916   KETONESUR NEGATIVE 08/08/2023 0916   PROTEINUR NEGATIVE 08/08/2023 0916   UROBILINOGEN 1.0 12/21/2012 1341   NITRITE NEGATIVE 08/08/2023 0916   LEUKOCYTESUR LARGE (A) 08/08/2023 0916   Sepsis Labs: @LABRCNTIP (procalcitonin:4,lacticidven:4)  ) Recent Results (from the past 240 hours)  Urine Culture (for pregnant, neutropenic or urologic patients or patients with an indwelling urinary catheter)     Status: Abnormal   Collection Time: 08/08/23  6:16 PM   Specimen: Urine, Clean Catch  Result Value Ref Range Status   Specimen Description URINE, CLEAN CATCH  Final  Special Requests NONE  Final   Culture (A)  Final    <10,000 COLONIES/mL INSIGNIFICANT GROWTH Performed at [facility], [address] [place], [place] [address]    Report Status 08/10/2023 FINAL  Final      Studies: No results found.   Scheduled Meds:  ALPRAZolam   0.25 mg Oral BID   apixaban   5 mg Oral BID   aspirin  EC  81 mg Oral Daily   azelastine  1 spray Each Nare BID   carvedilol   12.5 mg Oral BID   feeding supplement  237 mL Oral BID BM   [START ON 08/17/2023] ferrous gluconate  324 mg Oral Q breakfast   fluticasone   2 spray Each Nare BID   furosemide   40 mg Intravenous BID   insulin  aspart  0-6 Units Subcutaneous Q4H   insulin  glargine-yfgn  20 Units Subcutaneous Daily   olopatadine   1 drop Both Eyes BID   polyethylene glycol  34 g Oral QHS   sodium chloride   flush  3 mL Intravenous Q12H   terazosin   2 mg Oral QHS   traZODone   100 mg Oral QHS   umeclidinium bromide   1 puff Inhalation Daily    Continuous Infusions:   LOS: 1 day     Veronica Gordon, MD [facility]  If 7PM-7AM, please contact night-coverage [email or web address] 08/16/2023, 4:51 PM

## 2023-08-16 NOTE — Progress Notes (Addendum)
      [address]       [name] [address]             [id number]          Subjective: Sitting up in bed eating an omelet. [name] this was the best appetite he has had since discharge from the hospital last week. Said he still feels he is unable to get a full breath and prefers to keep the CPAP on.   Objective: Vital signs in last 24 hours: Temp:  [97.4 F (36.3 C)-98.3 F (36.8 C)] 97.9 F (36.6 C) (06/19 0253) Pulse Rate:  [70-94] 88 (06/19 0253) Cardiac Rhythm: Atrial flutter (06/18 1927) Resp:  [14-22] 21 (06/19 0649) BP: (122-168)/(68-89) 139/84 (06/19 0253) SpO2:  [98 %-100 %] 100 % (06/19 0253) Weight:  [123.3 kg] 123.3 kg (06/18 1316)     Intake/Output from previous day: 06/18 0701 - 06/19 0700 In: 480 [P.O.:480] Out: 3500 [Urine:3500] Intake/Output this shift: No intake/output data recorded.  General appearance: alert, cooperative, and mild distress Neurologic: intact Heart: A-flutter with CVR Lungs: O2 sat 99% on CPAP. Normal respiratory effort at rest. Breath sounds clear but shallow.   Abdomen: soft, no tenderness.  Extremities: Expected bruising right thigh, continues to have 2+ pretibial edema Wound: the sternotomy incision is intact and dry.   Lab Results: Recent Labs    08/15/23 0647 08/16/23 0328  WBC 11.5* 9.4  HGB 11.0* 10.7*  HCT 33.9* 32.1*  PLT 569* 513*   BMET:  Recent Labs    08/15/23 0647 08/16/23 0328  NA 130* 130*  K 3.6 3.6  CL 93* 91*  CO2 28 32  GLUCOSE 141* 107*  BUN 10 11  CREATININE 0.75 0.80  CALCIUM  7.9* 8.3*    PT/INR: No results for input(s): LABPROT, INR in the last 72 hours. ABG    Component Value Date/Time   PHART 7.330 (L) 07/31/2023 0134   HCO3 24.8 07/31/2023 0134   TCO2 26 07/31/2023 0134   ACIDBASEDEF 1.0 07/31/2023 0134   O2SAT 97 07/31/2023 0134   CBG (last 3)  Recent Labs    08/15/23 2025 08/15/23 2345 08/16/23 0447  GLUCAP 143* 132* 111*     Assessment/Plan:  -Re-admission post CABG with progressively worsening shortness of breath. CT was negative for PE but showed small bilateral pleural effusions and ATX in both lower lobes. Echo this admission showing EF 50-55%, elevated EDP, RV was not well visualized.   Sx have improved with net 3L diuresis yesterday.  Agree with continued diuresis.  Will add incentive spirometry and request PT/OT consults to assist with mobilization.  He was on Entresto  prior to CABG and we attempted to resume before his discharge but he did not tolerate it due to hypotension. He appears to have reasonable BP reserve now and would likely tolerate it OK. Will defer to the primary team.  May be worthwhile consulting cardiology to assist with optimizing his cardiac medications.      LOS: 1 day    Myron G. Roddenberry, PA-C 08/16/2023 Patient seen and examined agree with above Better after diuresis but still subjectively SOB  Landon Pinion C. Luna Salinas, MD [facility] [phone]

## 2023-08-16 NOTE — Plan of Care (Signed)

## 2023-08-17 ENCOUNTER — Encounter (HOSPITAL_BASED_OUTPATIENT_CLINIC_OR_DEPARTMENT_OTHER): Payer: Self-pay

## 2023-08-17 ENCOUNTER — Inpatient Hospital Stay (HOSPITAL_COMMUNITY)

## 2023-08-17 ENCOUNTER — Other Ambulatory Visit (HOSPITAL_COMMUNITY): Payer: Self-pay

## 2023-08-17 DIAGNOSIS — I5033 Acute on chronic diastolic (congestive) heart failure: Secondary | ICD-10-CM | POA: Diagnosis not present

## 2023-08-17 LAB — CBC
HCT: 31.8 % — ABNORMAL LOW (ref 39.0–52.0)
Hemoglobin: 10.5 g/dL — ABNORMAL LOW (ref 13.0–17.0)
MCH: 29.1 pg (ref 26.0–34.0)
MCHC: 33 g/dL (ref 30.0–36.0)
MCV: 88.1 fL (ref 80.0–100.0)
Platelets: 500 10*3/uL — ABNORMAL HIGH (ref 150–400)
RBC: 3.61 MIL/uL — ABNORMAL LOW (ref 4.22–5.81)
RDW: 13.8 % (ref 11.5–15.5)
WBC: 7.7 10*3/uL (ref 4.0–10.5)
nRBC: 0 % (ref 0.0–0.2)

## 2023-08-17 LAB — BASIC METABOLIC PANEL WITH GFR
Anion gap: 10 (ref 5–15)
BUN: 11 mg/dL (ref 8–23)
CO2: 32 mmol/L (ref 22–32)
Calcium: 8.2 mg/dL — ABNORMAL LOW (ref 8.9–10.3)
Chloride: 88 mmol/L — ABNORMAL LOW (ref 98–111)
Creatinine, Ser: 0.71 mg/dL (ref 0.61–1.24)
GFR, Estimated: >60 mL/min (ref >60)
Glucose, Bld: 106 mg/dL — ABNORMAL HIGH (ref 70–99)
Potassium: 3.4 mmol/L — ABNORMAL LOW (ref 3.5–5.1)
Sodium: 130 mmol/L — ABNORMAL LOW (ref 135–145)

## 2023-08-17 LAB — GLUCOSE, CAPILLARY
Glucose-Capillary: 116 mg/dL — ABNORMAL HIGH (ref 70–99)
Glucose-Capillary: 134 mg/dL — ABNORMAL HIGH (ref 70–99)
Glucose-Capillary: 147 mg/dL — ABNORMAL HIGH (ref 70–99)
Glucose-Capillary: 152 mg/dL — ABNORMAL HIGH (ref 70–99)
Glucose-Capillary: 160 mg/dL — ABNORMAL HIGH (ref 70–99)
Glucose-Capillary: 161 mg/dL — ABNORMAL HIGH (ref 70–99)
Glucose-Capillary: 181 mg/dL — ABNORMAL HIGH (ref 70–99)

## 2023-08-17 LAB — MAGNESIUM: Magnesium: 2 mg/dL (ref 1.7–2.4)

## 2023-08-17 MED ORDER — TIOTROPIUM BROMIDE MONOHYDRATE 2.5 MCG/ACT IN AERS
2.0000 | INHALATION_SPRAY | Freq: Every day | RESPIRATORY_TRACT | Status: DC
  Administered 2023-08-18 – 2023-08-26 (×5): 2 via RESPIRATORY_TRACT

## 2023-08-17 MED ORDER — FUROSEMIDE 10 MG/ML IJ SOLN
80.0000 mg | Freq: Two times a day (BID) | INTRAMUSCULAR | Status: DC
  Administered 2023-08-17 – 2023-08-19 (×4): 80 mg via INTRAVENOUS
  Filled 2023-08-17 (×4): qty 8

## 2023-08-17 MED ORDER — CARVEDILOL 3.125 MG PO TABS
3.1250 mg | ORAL_TABLET | Freq: Two times a day (BID) | ORAL | Status: DC
  Administered 2023-08-17 – 2023-08-20 (×7): 3.125 mg via ORAL
  Filled 2023-08-17 (×7): qty 1

## 2023-08-17 MED ORDER — OLOPATADINE HCL 0.1 % OP SOLN
1.0000 [drp] | Freq: Two times a day (BID) | OPHTHALMIC | Status: DC
  Administered 2023-08-17 – 2023-08-27 (×18): 1 [drp] via OPHTHALMIC
  Filled 2023-08-17: qty 5

## 2023-08-17 MED ORDER — SPIRONOLACTONE 12.5 MG HALF TABLET
12.5000 mg | ORAL_TABLET | Freq: Every day | ORAL | Status: DC
  Administered 2023-08-17 – 2023-08-20 (×4): 12.5 mg via ORAL
  Filled 2023-08-17 (×4): qty 1

## 2023-08-17 MED ORDER — TRAMADOL HCL 50 MG PO TABS
50.0000 mg | ORAL_TABLET | Freq: Four times a day (QID) | ORAL | Status: DC | PRN
  Administered 2023-08-17 – 2023-08-25 (×23): 50 mg via ORAL
  Filled 2023-08-17 (×24): qty 1

## 2023-08-17 MED ORDER — AMIODARONE LOAD VIA INFUSION
150.0000 mg | Freq: Once | INTRAVENOUS | Status: AC
  Administered 2023-08-17: 150 mg via INTRAVENOUS
  Filled 2023-08-17: qty 83.34

## 2023-08-17 MED ORDER — AMIODARONE HCL IN DEXTROSE 360-4.14 MG/200ML-% IV SOLN
30.0000 mg/h | INTRAVENOUS | Status: DC
  Administered 2023-08-18 (×3): 30 mg/h via INTRAVENOUS
  Filled 2023-08-17 (×4): qty 200

## 2023-08-17 MED ORDER — AMIODARONE HCL IN DEXTROSE 360-4.14 MG/200ML-% IV SOLN
60.0000 mg/h | INTRAVENOUS | Status: AC
  Administered 2023-08-17 (×2): 60 mg/h via INTRAVENOUS

## 2023-08-17 MED ORDER — POTASSIUM CHLORIDE CRYS ER 20 MEQ PO TBCR
40.0000 meq | EXTENDED_RELEASE_TABLET | Freq: Two times a day (BID) | ORAL | Status: AC
  Administered 2023-08-17 (×2): 40 meq via ORAL
  Filled 2023-08-17 (×2): qty 2

## 2023-08-17 NOTE — Progress Notes (Signed)
 Mobility Specialist: Progress Note   08/17/23 1544  Mobility  Activity Ambulated with assistance in hallway  Level of Assistance Contact guard assist, steadying assist  Assistive Device Front wheel walker  Distance Ambulated (ft) 125 ft  RUE Weight Bearing Per Provider Order NWB  LUE Weight Bearing Per Provider Order NWB  Activity Response Tolerated well  Mobility Referral Yes  Mobility visit 1 Mobility  Mobility Specialist Start Time (ACUTE ONLY) 1415  Mobility Specialist Stop Time (ACUTE ONLY) 1445  Mobility Specialist Time Calculation (min) (ACUTE ONLY) 30 min    Pt received in bed, agreeable to mobility session. Adhering to sternal precautions well. SV for supine>sit and STS, CG for ambulation. SpO2 WFL on 3LO2. No complaints. Ambulated to the nursing station and back without fault. Returned to the room and sat in the chair while MS assisted with linen change and pericare. Requested to return to bed. MinA for sit>supine to assist BLEs. Left in bed with all needs met, call bell in reach.   Deloria Fetch Mobility Specialist Please contact via SecureChat or Rehab office at [phone]

## 2023-08-17 NOTE — Consult Note (Addendum)
 Cardiology Consultation   Patient ID: [name]. MRN: [id number]; DOB: Jun 17, 1950  Admit date: 08/14/2023 Date of Consult: 08/17/2023  PCP:  Clinic, [facility] Providers Cardiologist[facility]   Patient Profile: [name]. is a 73 y.o. male with a hx of PAF on eliquis , HFpEF, alcohol use, cannabis use, hx of tobacco use, HLD with statin intolerance, COPD, DM2, CKD2, OSA on CPAP, obesity, remote hx of ICH, provoked DVT who is being seen 08/17/2023 for the evaluation of CHF medication adjustments at the request of Dr. Hester Lot.  History of Present Illness:  [name] was hospitalized in 2023 with PNA and diagnosed with new Afib. Burden was 44% on heart monitor and he was anticoagulated with eliquis . Per patient, he has not had a recurrence. He reportedly saw EP and decided against antiarrhythmic or ablation. At his last cardiology appt on 06/29/23, he was in SR and maintained on 20 mg lasix  daily, 25 mg coreg  BID, 97/103 mg entresto  BID. 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 Wife at home with advanced parkinson's.   He was admitted with NSTEMI 07/28/23 found to have severe multivessel disease. He ultimately underwent CABG x 3 and LAA on 07/30/23: LIMA-LAD, SVG-OM1, SVG-PDA with coronary endarterectomy.   Prior to surgery, he was maintained on eliquis  for PAF, this was resumed on 08/02/23. Echo last admission with LVEF 55-60%, grade 2 DD. He was gently diuresed before and after CABG. Unfortunately, he required IV lasix  and metolazone  was added. He was started on low dose entresto  but developed hypotension and it was discontinued. He developed UTI treated with ABX. He has chronic hyponatremia. Eliquis  restarted prior to discharge. He discharged to SNF on 08/10/23.  Unfortunately he presented back to [facility] from SNF with SOB. BNP 489. CXR with small bilateral pleural effusions with partial loculation on left. He was admitted with acute  on chronic diastolic heart failure and pleural effusions. He was started on IV lasix  and cardiology consulted to help with medication titration.   Repeat echo 08/15/23 with LVEF 50-55% with elevated LVEDP, trivial MR, and no significant AS. Noted atrial flutter during echo.   Telemetry and EKG confirm rate controlled atrial flutter.   CTA with nondisplaced right posterior first rib fracture. No PE. Partially loculated left effusion.   On exam, he is unable to take a deep breath. When asked about rib cage pain he agrees he does have pain, but is unsure that can entirely explain his SOB. He was able to pull 1000 on IS prior to discharge and now only 200. No angina, but chest wall soreness. He states his lasix  was discontinued while at SNF and coincided with increased SOB.    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   Neuromuscular disorder (HCC)    neuropathy  Non-toxic multinodular goiter 07/28/2023   Other psoriasis 07/28/2023   Paroxysmal atrial fibrillation (HCC) 09/22/2021   Peripheral vascular disease (HCC) 2006   dvt 's   Personal history of colon polyps, unspecified 07/28/2023   Phlebitis and thrombophlebitis of lower extremities 07/28/2023   Aug 28, 2008 Entered By: Loreen Roers Comment: h/o this in 2006     Pneumonia 2006   RBBB    S/P rotator cuff repair 05/20/2015   Sleep apnea    Subdural hematoma (HCC)     Past Surgical History:  Procedure Laterality Date   BRAIN  SURGERY  2010   subdural hematoma -bleed6 months after heart cath   CATARACT EXTRACTION, BILATERAL  12/16, 2017   cataract   CHOLECYSTECTOMY N/A 09/08/2020   Procedure: LAPAROSCOPIC CHOLECYSTECTOMY WITH INTRAOPERATIVE CHOLANGIOGRAM;  Surgeon: Dareen Ebbing, MD;  Location: [facility] ORS;  Service: General;  Laterality: N/A;   CLIPPING OF ATRIAL APPENDAGE Left 07/30/2023   Procedure: CLIPPING, LEFT ATRIAL APPENDAGE;  Surgeon: Zelphia Higashi, MD;  Location: [facility] OR;  Service: Open Heart Surgery;  Laterality: Left;   COLONOSCOPY  2014   CORONARY ARTERY BYPASS GRAFT N/A 07/30/2023   Procedure: CORONARY ARTERY BYPASS GRAFTING (CABG) TIMES THREE USING THE LEFT INTERNAL MAMMARY ARTERY AND ENDOSCOPICALLY HARVESTED RIGHT GREATER SAPHENOUS VEIN;  Surgeon: Zelphia Higashi, MD;  Location: [facility] OR;  Service: Open Heart Surgery;  Laterality: N/A;   CYSTOSCOPY WITH INSERTION OF UROLIFT     CYSTOSCOPY WITH INSERTION OF UROLIFT     DG THUMB LEFT HAND Left    gamers    INTRAOPERATIVE TRANSESOPHAGEAL ECHOCARDIOGRAM N/A 07/30/2023   Procedure: ECHOCARDIOGRAM, TRANSESOPHAGEAL, INTRAOPERATIVE;  Surgeon: Zelphia Higashi, MD;  Location: [facility] OR;  Service: Open Heart Surgery;  Laterality: N/A;   RIGHT/LEFT HEART CATH AND CORONARY ANGIOGRAPHY N/A 07/27/2023   Procedure: RIGHT/LEFT HEART CATH AND CORONARY ANGIOGRAPHY;  Surgeon: Arnoldo Lapping, MD;  Location: [facility] INVASIVE CV LAB;  Service: Cardiovascular;  Laterality: N/A;   ROTATOR CUFF REPAIR W/ DISTAL CLAVICLE EXCISION Left 05/20/2015   SHOULDER ARTHROSCOPY WITH SUBACROMIAL DECOMPRESSION, ROTATOR CUFF REPAIR AND BICEP TENDON REPAIR Left 05/20/2015   Procedure: LEFT SHOULDER ARTHROSCOPY WITH SUBACROMIAL DECOMPRESSION,DISTAL CLAVICAL RESECTION  ROTATOR CUFF REPAIR ;  Surgeon: Ellard Gunning, MD;  Location: [facility] OR;  Service: Orthopedics;  Laterality: Left;   TONSILLECTOMY       Home Medications:  Prior to Admission medications   Medication Sig Start Date End Date  Taking? Authorizing Provider  acetaminophen  (TYLENOL ) 325 MG tablet Take 2 tablets (650 mg total) by mouth every 6 (six) hours as needed for mild pain (pain score 1-3). Patient taking differently: Take 650 mg by mouth every 4 (four) hours as needed for mild pain (pain score 1-3) or moderate pain (pain score 4-6). 08/10/23  Yes Angela Barban, PA-C  ALPRAZolam  (XANAX ) 0.25 MG tablet Take 0.25 mg by mouth 2 (two) times daily.   Yes [provider]  apixaban  (ELIQUIS ) 5 MG TABS tablet Take 5 mg by mouth 2 (two) times daily. 09/27/21  Yes [provider]  aspirin  EC 81 MG tablet Take 1 tablet (81 mg total) by mouth daily. Swallow whole. 08/10/23  Yes Angela Barban, PA-C  Azelastine HCl 137 MCG/SPRAY SOLN Place 1 spray into both nostrils 2 (two) times daily.  06/20/19  Yes [provider]  carvedilol  (COREG ) 12.5 MG tablet Take 1 tablet (12.5 mg total) by mouth 2 (two) times daily. 08/10/23  Yes Angela Barban, PA-C  Cholecalciferol (VITAMIN D) 50  MCG (2000 UT) CAPS Take 1 capsule by mouth at bedtime.   Yes [provider]  Coenzyme Q10 (COQ10) 100 MG CAPS Take 100 mg by mouth daily.   Yes [provider]  cromolyn  (OPTICROM ) 4 % ophthalmic solution Place 1 drop into both eyes every 6 (six) hours as needed (itchy eyes).   Yes [provider]  fluticasone  (FLONASE ) 50 MCG/ACT nasal spray Place 2 sprays into both nostrils 2 (two) times daily. 09/27/21  Yes [provider]  furosemide  (LASIX ) 20 MG tablet Take 1 tablet (20 mg total) by mouth daily. 08/13/23  Yes Angela Barban, PA-C  Glucosamine-Chondroit-Vit C-Mn (GLUCOSAMINE 1500 COMPLEX PO) Take 1 tablet by mouth at bedtime.   Yes [provider]  hydrOXYzine  (VISTARIL ) 25 MG capsule Take 25 mg by mouth 2 (two) times daily as needed (sleep).   Yes [provider]  insulin  aspart (NOVOLOG ) 100 UNIT/ML injection Inject 0-8 Units into the skin 3 (three) times daily before  meals. Inject as per sliding scale: 0-150 = 0 units 151-200 = 2 units 201-250 = 3 units 251-300 = 4 units 301-350 = 6 units 351-400 = 8 units 401-999 = 8 units, Call MD   Yes [provider]  insulin  glargine (LANTUS ) 100 UNIT/ML injection Inject 20 Units into the skin 2 (two) times daily.   Yes [provider]  MAGNESIUM  OXIDE PO Take 500 mg by mouth at bedtime.   Yes [provider]  Multiple Vitamin (MULTIVITAMIN WITH MINERALS) TABS Take 1 tablet by mouth every evening.    Yes [provider]  neomycin-polymyxin-hydrocortisone (CORTISPORIN) OTIC solution Place 4 drops into the right ear 4 (four) times daily as needed (itchy ears).   Yes [provider]  olopatadine  (PATANOL) 0.1 % ophthalmic solution Place 1 drop into both eyes 2 (two) times daily.   Yes [provider]  oxyCODONE  (OXY IR/ROXICODONE ) 5 MG immediate release tablet Take 1 tablet (5 mg total) by mouth every 6 (six) hours as needed for severe pain (pain score 7-10). 08/10/23  Yes Angela Barban, PA-C  polyethylene glycol powder (GLYCOLAX /MIRALAX ) 17 GM/SCOOP powder Take 34 g by mouth at bedtime. 10/06/19  Yes [provider]  potassium gluconate 595 MG TABS Take 595 mg by mouth at bedtime.   Yes [provider]  Probiotic Product (PROBIOTIC DAILY PO) Take 2 capsules by mouth daily.   Yes [provider]  terazosin  (HYTRIN ) 2 MG capsule Take 2 mg by mouth at bedtime. 12/14/22  Yes [provider]  Tiotropium Bromide  Monohydrate (SPIRIVA  RESPIMAT) 2.5 MCG/ACT AERS Inhale 2 puffs into the lungs daily.   Yes [provider]  traZODone  (DESYREL ) 100 MG tablet Take 100 mg by mouth at bedtime. 06/18/23  Yes [provider]  vitamin B-12 (CYANOCOBALAMIN) 500 MCG tablet Take 500 mcg by mouth every evening.   Yes [provider]    Scheduled Meds:  ALPRAZolam   0.25 mg Oral BID   apixaban   5 mg Oral BID   aspirin  EC  81  mg Oral Daily   azelastine  1 spray Each Nare BID   carvedilol   12.5 mg Oral BID   feeding supplement  237 mL Oral BID BM   ferrous gluconate  324 mg Oral Q breakfast   fluticasone   2 spray Each Nare BID   furosemide   40 mg Intravenous BID   insulin  aspart  0-6 Units Subcutaneous Q4H   insulin  glargine-yfgn  20 Units Subcutaneous Daily  ipratropium-albuterol   3 mL Nebulization TID   olopatadine   1 drop Both Eyes Q12H   polyethylene glycol  34 g Oral QHS   potassium chloride   40 mEq Oral BID   sodium chloride  flush  3 mL Intravenous Q12H   terazosin   2 mg Oral QHS   traZODone   100 mg Oral QHS   umeclidinium bromide   1 puff Inhalation Daily   Continuous Infusions:  PRN Meds: acetaminophen  **OR** acetaminophen , fentaNYL  (SUBLIMAZE ) injection, hydrOXYzine , senna-docusate, traMADol , trimethobenzamide  Allergies:    Allergies  Allergen Reactions   Ace Inhibitors Other (See Comments)    Angioedema per pt report to admiting MD On 07/31/2019 - lisinopril    Gabapentin Swelling and Other (See Comments)     Influenza-like illness   Peanut Oil Shortness Of Breath    Swelling throat, sob  Other reaction(s): anaphylaxis   Tamiflu [Oseltamivir Phosphate] Shortness Of Breath   Losartan Swelling    Other reaction(s): Lip swelling   Lyrica [Pregabalin] Swelling   Oxcarbazepine Swelling and Other (See Comments)    Lip swelling   Statins Other (See Comments)    Myalgia -- atorvastatin, simvastatin   Avelox [Moxifloxacin Hcl In Nacl] Rash   Ciprofloxacin  Anxiety   Citalopram Anxiety    Other reaction(s): Drowsy, Anxiety   Victoza [Liraglutide] Other (See Comments)    [facility]    Social History:   Social History   Socioeconomic History   Marital status: Married    Spouse name: Not on file   Number of children: 2   Years of education: Not on file   Highest education level: Not on file  Occupational History   Occupation: Radio Tech  Tobacco Use   Smoking status: Former     Current packs/day: 0.00    Average packs/day: 1 pack/day for 15.0 years (15.0 ttl pk-yrs)    Types: Cigarettes    Start date: 1964    Quit date: 1979    Years since quitting: 46.4   Smokeless tobacco: Never  Vaping Use   Vaping status: Former   Devices: THC  Substance and Sexual Activity   Alcohol use: Yes    Alcohol/week: 10.0 standard drinks of alcohol    Types: 10 Cans of beer per week    Comment: 3 beers per day   Drug use: Yes    Types: Marijuana    Comment: THC   Sexual activity: Not Currently  Other Topics Concern   Not on file  Social History Narrative   Patient is a veteran and gets his care through the [place] system   He is married with 2 daughters   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   3 beers a day on average 2 caffeinated beverages daily former smoker no current tobacco or drug use   Social Drivers of Corporate investment banker Strain: Not on file  Food Insecurity: No Food Insecurity (08/15/2023)   Hunger Vital Sign    Worried About Running Out of Food in the Last Year: Never true    Ran Out of Food in the Last Year: Never true  Transportation Needs: No Transportation Needs (08/15/2023)   PRAPARE - Administrator, Civil Service (Medical): No    Lack of Transportation (Non-Medical): No  Physical Activity: Not on file  Stress: Not on file  Social Connections: Moderately Integrated (08/15/2023)   Social Connection and Isolation Panel    Frequency of Communication with Friends and Family: Twice  a week    Frequency of Social Gatherings with Friends and Family: More than three times a week    Attends Religious Services: Never    Database administrator or Organizations: Yes    Attends Banker Meetings: Never    Marital Status: Married  Catering manager Violence: Not At Risk (08/15/2023)   Humiliation, Afraid, Rape, and Kick questionnaire    Fear of Current or Ex-Partner: No    Emotionally Abused: No     Physically Abused: No    Sexually Abused: No    Family History:    Family History  Problem Relation Age of Onset   Hypertension Mother    Alzheimer's disease Mother    Stroke Father    Heart attack Maternal Grandfather 35   Cancer Paternal Grandfather        type unknown   Diabetes Brother    Colon polyps Neg Hx    Colon cancer Neg Hx    Liver cancer Neg Hx    Stomach cancer Neg Hx    Rectal cancer Neg Hx      ROS:  Please see the history of present illness.   All other ROS reviewed and negative.     Physical Exam/Data: Vitals:   08/17/23 0003 08/17/23 0431 08/17/23 0757 08/17/23 1157  BP: 134/69 125/76 116/66 117/69  Pulse: 86 87  86  Resp: 17 20  20   Temp: 98.4 F (36.9 C) (!) 97.5 F (36.4 C) 98.6 F (37 C) 97.6 F (36.4 C)  TempSrc: Oral Oral  Oral  SpO2: 98% 98%  95%  Weight:      Height:        Intake/Output Summary (Last 24 hours) at 08/17/2023 1242 Last data filed at 08/17/2023 0957 Gross per 24 hour  Intake 240 ml  Output 1800 ml  Net -1560 ml      08/16/2023    6:49 AM 08/15/2023    1:16 PM 08/10/2023    7:00 AM  Last 3 Weights  Weight (lbs) -- 271 lb 14.4 oz 270 lb 15.1 oz  Weight (kg) -- 123.333 kg 122.9 kg     Body mass index is 34.91 kg/m.  General:  elderly male found resting with HOB at 45 degrees and with nasal CPAP in place HEENT: normal Neck:  mild JVD - CPAP strap in place Cardiac:  irregular rhythm, regular rate, sternotomy healing well Lungs:  diminished and crackles in bases  Ext: B LE edema 2+ Musculoskeletal:  No deformities, BUE and BLE strength normal and equal Skin: warm and dry  Neuro:  CNs 2-12 intact, no focal abnormalities noted Psych:  Normal affect   EKG:  The EKG was personally reviewed and demonstrates:  atrial flutter with VR 85 Telemetry:  Telemetry was personally reviewed and demonstrates:  atrial flutter with ventricular rates in the 80-90s  Relevant CV Studies:  Echo 08/15/23:  1. Left ventricular  ejection fraction, by estimation, is 50 to 55%. The  left ventricle has low normal function. The left ventricle demonstrates  regional wall motion abnormalities (see scoring diagram/findings for  description). There is mild left  ventricular hypertrophy. Left ventricular diastolic parameters are  indeterminate. Elevated left ventricular end-diastolic pressure. There is  moderate hypokinesis of the left ventricular, mid-apical septal wall and  apical segment.   2. Right ventricular systolic function was not well visualized. The right  ventricular size is not well visualized.   3. Left atrial size was mildly dilated.  4. The mitral valve is degenerative. Trivial mitral valve regurgitation.  No evidence of mitral stenosis. The mean mitral valve gradient is 4.0  mmHg. Severe mitral annular calcification.   5. The aortic valve was not well visualized. Aortic valve regurgitation  is not visualized. Aortic valve sclerosis/calcification is present,  without any evidence of aortic stenosis. Aortic valve mean gradient  measures 7.0 mmHg.   6. Rhythm strip during this exam demonstrates atrial flutter.   Laboratory Data: High Sensitivity Troponin:   Recent Labs  Lab 07/25/23 1800 07/25/23 2006 08/14/23 2200 08/15/23 0006 08/15/23 0912  TROPONINIHS 6,267* 6,105* 83* 82* 80*     Chemistry Recent Labs  Lab 08/15/23 0647 08/16/23 0328 08/17/23 0255 08/17/23 0945  NA 130* 130* 130*  --   K 3.6 3.6 3.4*  --   CL 93* 91* 88*  --   CO2 28 32 32  --   GLUCOSE 141* 107* 106*  --   BUN 10 11 11   --   CREATININE 0.75 0.80 0.71  --   CALCIUM  7.9* 8.3* 8.2*  --   MG 2.1  --   --  2.0  GFRNONAA >60 >60 >60  --   ANIONGAP 9 7 10   --     No results for input(s): PROT, ALBUMIN , AST, ALT, ALKPHOS, BILITOT in the last 168 hours. Lipids No results for input(s): CHOL, TRIG, HDL, LABVLDL, LDLCALC, CHOLHDL in the last 168 hours.  Hematology Recent Labs  Lab 08/15/23 0647  08/16/23 0328 08/17/23 0255  WBC 11.5* 9.4 7.7  RBC 3.73* 3.58* 3.61*  HGB 11.0* 10.7* 10.5*  HCT 33.9* 32.1* 31.8*  MCV 90.9 89.7 88.1  MCH 29.5 29.9 29.1  MCHC 32.4 33.3 33.0  RDW 13.9 13.8 13.8  PLT 569* 513* 500*   Thyroid No results for input(s): TSH, FREET4 in the last 168 hours.  BNP Recent Labs  Lab 08/15/23 0006  BNP 488.7*    DDimer No results for input(s): DDIMER in the last 168 hours.  Radiology/Studies:  ECHOCARDIOGRAM COMPLETE Result Date: 08/15/2023    ECHOCARDIOGRAM REPORT   Patient Name:   Millie Shorb. Date of Exam: 08/15/2023 Medical Rec #:  [id number]           Height:       74.0 in Accession #:    [id number]          Weight:       271.9 lb Date of Birth:  Jul 17, 1950            BSA:          2.477 m Patient Age:    72 years            BP:           122/68 mmHg Patient Gender: M                   HR:           85 bpm. Exam Location:  Inpatient Procedure: 2D Echo, Color Doppler and Cardiac Doppler (Both Spectral and Color            Flow Doppler were utilized during procedure). Indications:    Dyspnea  History:        Patient has prior history of Echocardiogram examinations, most                 recent 07/25/2023. Risk Factors:Diabetes and Hypertension.  Sonographer:    Jeralene Mom Referring Phys: [phone]  MYRON G RODDENBERRY IMPRESSIONS  1. Left ventricular ejection fraction, by estimation, is 50 to 55%. The left ventricle has low normal function. The left ventricle demonstrates regional wall motion abnormalities (see scoring diagram/findings for description). There is mild left ventricular hypertrophy. Left ventricular diastolic parameters are indeterminate. Elevated left ventricular end-diastolic pressure. There is moderate hypokinesis of the left ventricular, mid-apical septal wall and apical segment.  2. Right ventricular systolic function was not well visualized. The right ventricular size is not well visualized.  3. Left atrial size was mildly dilated.  4.  The mitral valve is degenerative. Trivial mitral valve regurgitation. No evidence of mitral stenosis. The mean mitral valve gradient is 4.0 mmHg. Severe mitral annular calcification.  5. The aortic valve was not well visualized. Aortic valve regurgitation is not visualized. Aortic valve sclerosis/calcification is present, without any evidence of aortic stenosis. Aortic valve mean gradient measures 7.0 mmHg.  6. Rhythm strip during this exam demonstrates atrial flutter. Comparison(s): Changes from prior study are noted. 07/25/2023: LVEF 55-60%. FINDINGS  Left Ventricle: Left ventricular ejection fraction, by estimation, is 50 to 55%. The left ventricle has low normal function. The left ventricle demonstrates regional wall motion abnormalities. Moderate hypokinesis of the left ventricular, mid-apical septal wall and apical segment. There is mild left ventricular hypertrophy. Left ventricular diastolic function could not be evaluated due to atrial flutter. Left ventricular diastolic parameters are indeterminate. Elevated left ventricular end-diastolic  pressure.  LV Wall Scoring: The apical septal segment, apical anterior segment, and apex are hypokinetic. Right Ventricle: The right ventricular size is not well visualized. Right vetricular wall thickness was not well visualized. Right ventricular systolic function was not well visualized. Left Atrium: Left atrial size was mildly dilated. Right Atrium: Right atrial size was normal in size. Pericardium: There is no evidence of pericardial effusion. Mitral Valve: The mitral valve is degenerative in appearance. Severe mitral annular calcification. Trivial mitral valve regurgitation. No evidence of mitral valve stenosis. MV peak gradient, 12.8 mmHg. The mean mitral valve gradient is 4.0 mmHg. Tricuspid Valve: The tricuspid valve is grossly normal. Tricuspid valve regurgitation is trivial. Aortic Valve: The aortic valve was not well visualized. Aortic valve regurgitation is  not visualized. Aortic valve sclerosis/calcification is present, without any evidence of aortic stenosis. Aortic valve mean gradient measures 7.0 mmHg. Aortic valve peak gradient measures 13.1 mmHg. Aortic valve area, by VTI measures 2.42 cm. Pulmonic Valve: The pulmonic valve was normal in structure. Pulmonic valve regurgitation is not visualized. Aorta: The aortic root and ascending aorta are structurally normal, with no evidence of dilitation. IAS/Shunts: No atrial level shunt detected by color flow Doppler. EKG: Rhythm strip during this exam demonstrates atrial flutter.  LEFT VENTRICLE PLAX 2D LVIDd:         4.60 cm      Diastology LVIDs:         2.90 cm      LV e' medial:    4.68 cm/s LV PW:         1.20 cm      LV E/e' medial:  38.2 LV IVS:        1.30 cm      LV e' lateral:   6.64 cm/s LVOT diam:     2.30 cm      LV E/e' lateral: 27.0 LV SV:         84 LV SV Index:   34 LVOT Area:     4.15 cm  LV Volumes (MOD) LV vol d, MOD  A2C: 102.0 ml LV vol d, MOD A4C: 87.1 ml LV vol s, MOD A2C: 48.0 ml LV vol s, MOD A4C: 36.8 ml LV SV MOD A2C:     54.0 ml LV SV MOD A4C:     87.1 ml LV SV MOD BP:      52.3 ml RIGHT VENTRICLE RV S prime:     6.53 cm/s TAPSE (M-mode): 2.0 cm LEFT ATRIUM             Index        RIGHT ATRIUM           Index LA Vol (A2C):   99.6 ml 40.22 ml/m  RA Area:     22.50 cm LA Vol (A4C):   79.6 ml 32.14 ml/m  RA Volume:   63.20 ml  25.52 ml/m LA Biplane Vol: 90.3 ml 36.46 ml/m  AORTIC VALVE AV Area (Vmax):    2.52 cm AV Area (Vmean):   2.30 cm AV Area (VTI):     2.42 cm AV Vmax:           181.00 cm/s AV Vmean:          122.000 cm/s AV VTI:            0.349 m AV Peak Grad:      13.1 mmHg AV Mean Grad:      7.0 mmHg LVOT Vmax:         110.00 cm/s LVOT Vmean:        67.500 cm/s LVOT VTI:          0.203 m LVOT/AV VTI ratio: 0.58 MITRAL VALVE                TRICUSPID VALVE MV Area (PHT): 3.99 cm     TR Peak grad:   21.0 mmHg MV Area VTI:   1.68 cm     TR Vmax:        229.00 cm/s MV Peak grad:   12.8 mmHg MV Mean grad:  4.0 mmHg     SHUNTS MV Vmax:       1.79 m/s     Systemic VTI:  0.20 m MV Vmean:      86.3 cm/s    Systemic Diam: 2.30 cm MV Decel Time: 190 msec MV E velocity: 179.00 cm/s MV A velocity: 88.60 cm/s MV E/A ratio:  2.02 Dinah Franco MD Electronically signed by Dinah Franco MD Signature Date/Time: 08/15/2023/5:22:44 PM    Final    CT Angio Chest PE W and/or Wo Contrast Result Date: 08/15/2023 EXAM: CTA CHEST AORTA 08/15/2023 12:22:29 AM TECHNIQUE: CTA of the chest was performed after the administration of intravenous contrast. Multiplanar reformatted images are provided for review. MIP images are provided for review. Automated exposure control, iterative reconstruction, and/or weight based adjustment of the mA/kV was utilized to reduce the radiation dose to as low as reasonably achievable. COMPARISON: Chest radiograph yesterday and CTA chest dated 07/25/2008. CLINICAL HISTORY: Pulmonary embolism (PE) suspected, high prob. 75cc [id number]; SOB. Pt had a CABG last week on Monday and started having SOB today. Pt was taken off his fluid pill per him. Pt has edema all over. Pt placed on CPAP by EMS for increased work of breathing and swallow resps. FINDINGS: AORTA: No thoracic aortic dissection. No aneurysm. Thoracic aortic atherosclerosis. MEDIASTINUM: No mediastinal lymphadenopathy. The heart and pericardium demonstrate no acute abnormality. Postsurgical changes related to prior CABG. Left atrial appendage clip. Moderate 3-vessel coronary atherosclerosis. LYMPH NODES: No mediastinal, hilar  or axillary lymphadenopathy. LUNGS AND PLEURA: No evidence of pulmonary embolism. Small bilateral pleural effusions, partially loculated on the left. No frank interstitial edema. Compressive atelectasis of the bilateral lower lobes. No pneumothorax. UPPER ABDOMEN: Status post cholecystectomy. Atherosclerotic calcifications of the abdominal aorta. SOFT TISSUES AND BONES: Nondisplaced right posterior first rib  fracture (image 16). Median sternotomy. Mild degenerative changes of the visualized thoracolumbar spine. IMPRESSION: 1. No evidence of pulmonary embolism. 2. Nondisplaced right posterior 1st rib fracture. No pneumothorax. 3. Small bilateral pleural effusions, partially loculated on the left. No frank interstitial edema. Electronically signed by: Zadie Herter MD 08/15/2023 12:39 AM EDT RP Workstation: [id number]   DG Chest Portable 1 View Result Date: 08/14/2023 CLINICAL DATA:  sob EXAM: PORTABLE CHEST 1 VIEW COMPARISON:  Chest x-ray 08/09/2023, chest x-ray 08/01/2023 FINDINGS: Stable cardiomediastinal enlargement. The heart and mediastinal contours are unchanged. Left atrial appendage clip. No focal consolidation. Persistent mild pulmonary edema. Possible trace left pleural effusion. No pneumothorax. No acute osseous abnormality.  Sternotomy wires are intact. IMPRESSION: 1. Persistent mild pulmonary edema. 2. Possible trace left pleural effusion. Electronically Signed   By: Morgane  Naveau M.D.   On: 08/14/2023 22:26     Assessment and Plan:  Acute on chronic diastolic heart failure LVEF 50-55% - per notes, pt was taking daily 20 mg lasix , 25 mg coreg  BID, 97/103 mg entresto  BID, and 12.5 mg jardiance outpatient prior to CABG (primarily follows at the [place], last cards appt 06/29/23) - entresto  discontinued after CABG due to hypotension last admission - echo this admission with LVEF 50-55% - GDTM has been limited by BP - currently on 12.5 mg coreg  BID - would add 12.5 mg spironolactone - diuresing with 40 mg IV lasix  BID - 2.2 L urine output yesterday, no updated weight in chart - he does not think the IV lasix  made a significant improvement in his breathing - still volume up on exam - will increase lasix  to 80 mg IV this evening - last admission he required 2 days of metolazone   - would like to add back jardiance 10 mg today - BP today 110-120 range - will watch diuresis progress on higher  dose of lasix  - if good diuresis and persistent symptoms, may ultimately need RHC   CABG x 3 07/30/23 - no chest pain, expected chest soreness - continue ASA, no plavix given eliquis    PAF Atrial flutter with CVR - eliquis  was resumed on 08/02/23 after CABG - EKG with atrial flutter with variable block - rate controlled on telemetry in the 80-90s - currently on 12.5 mg coreg  - may ultimately need to transition to toprol  for more BP room for GDMT/diuresis - question if flutter is contributing to SOB - just shy of three weeks of anticoagulation - briefly discussed DCCV and he is amenable, although if DCCV prior to next Thursday he would require TEE   Chronic anticoagulation - on ASA post CABG in the setting of ACS presentation - on eliquis  without bleeding - restarted on 08/02/23 - PLT trending down to 500   Hyperlipidemia with LDL goal < 55 Consider lower LDL goal given smoking history 07/25/2023: Cholesterol 214; HDL 44; LDL Cholesterol 154; Triglycerides 79; VLDL 16 He is statin intolerant and reported oral swelling with zetia - will need PCSK9i OP [facility] to [facility]   Prior DVT - 2006 Prior subdural hematoma - 2010 - has done well on eliquis     Risk Assessment/Risk  Scores:  New [place]  Heart Association (NYHA) Functional Class NYHA Class IV  CHA2DS2-VASc Score = 5   This indicates a 7.2% annual risk of stroke. The patient's score is based upon: CHF History: 1 HTN History: 1 Diabetes History: 1 Stroke History: 0 Vascular Disease History: 1 Age Score: 1 Gender Score: 0      For questions or updates, please contact [facility] Please consult [email or web address] for contact info under    Signed, Lamond Pilot, PA  08/17/2023 12:42 PM  Patient seen and examined with the above-signed Advanced Practice Provider and/or Housestaff. I personally reviewed laboratory data, imaging studies and relevant notes. I  independently examined the patient and formulated the important aspects of the plan. I have edited the note to reflect any of my changes or salient points. I have personally discussed the plan with the patient and/or family.  73 y/o male as above recently admitted with NSTEMI and underwent CABG. Discharged to SNF.   Readmitted with recurrent AFL and volume overload  Echo EF 50-55% (I thought 45-50% with apical HK)  General:  Obese male sitting up in bed  No resp difficulty HEENT: normal Neck: supple. JVP to jaw  Carotids 2+ bilat; no bruits. No lymphadenopathy or thryomegaly appreciated. Cor: Irregular Lungs: clear Abdomen: obese soft, nontender, + distended. No bruits or masses. Good bowel sounds. Extremities: no cyanosis, clubbing, rash, 2+  edema Neuro: alert & orientedx3, cranial nerves grossly intact. moves all 4 extremities w/o difficulty. Affect pleasant  He has significant volume overload in setting of recurrent AFL - start IV amio - continue eliquis  - diurese with lasix  80 IV bid - add metolazone  as needed - place compression hose - adjust GDMT (decrease carvedilol , add spiro, restart Jardiance and Entresto  as tolerated) - aggressive pulmonary toilet - TEE/DC-CV Monday if does not convert prior  Almee Pelphrey, MD  4:57 PM

## 2023-08-17 NOTE — Progress Notes (Signed)
 PROGRESS NOTE  [name]. FAO:[id number] DOB: Jan 29, 1951 DOA: 08/14/2023 PCP: [facility]  HPI/Recap of past 24 hours: [name]. is a 73 y.o. 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 Pt was told to hold his lasix  2/2 ?hyponatremia prior to d/c to SNF. Pt was placed on CPAP with EMS. Upon arrival to the ED, patient is found to be afebrile, on CPAP with tachypnea, elevated BP.  Labs are most notable for sodium 128, troponin 83, and BNP 489.  CTA chest is negative for PE but notable for nondisplaced right posterior first rib fracture and small bilateral pleural effusions with partial loculation on the left. Cardiothoracic surgery was consulted by the EDP.  Patient admitted for further management.    Today, patient still reporting shortness of breath, reports some shallow breath, denies any chest pain.    Assessment/Plan: Principal Problem:   Acute on chronic diastolic CHF (congestive heart failure) (HCC) Active Problems:   Essential hypertension, benign   Type 2 diabetes mellitus with hyperglycemia, with long-term current use of insulin  (HCC)   Sleep apnea   Hyponatremia   COPD (chronic obstructive pulmonary disease) (HCC)   Coronary artery disease involving native coronary artery of native heart without angina pectoris   Paroxysmal atrial fibrillation (HCC)   Generalized anxiety disorder   Acute on chronic HFpEF; pleural effusions   Currently on CPAP, alternating with 3 L of O2 nasal cannula BNP 488.7, troponin 82->80, flat trend EKG noted atrial flutter, rate controlled, noted QTc prolongation Repeat echo with EF of 50 to 55%, noted regional wall motion abnormality CTA chest with no evidence of PE, no  pneumothorax, small bilateral pleural effusion, no frank interstitial edema Chest x-ray with cardiomegaly with mild congestive HF Continue diuresing with IV Lasix , Coreg  Cardiothoracic surgery consulted for further management Cardiology consulted, appreciate recs, increase Lasix , spironolactone, Jardiance Monitor weight and I/Os, monitor renal function and electrolytes  Hypokalemia Replace as needed   CAD s/p CABG  S/p CABG on 07/30/23 No anginal symptoms  Continue ASA and beta-blocker    PAF QTc prolongation EKG showed atrial flutter, QTc prolongation Start IV amiodarone Continue Eliquis , Coreg   Cardiology on board, may require cardioversion  Normocytic anemia Hemoglobin around baseline Anemia panel showed iron 34, sats 11 Gave 1 dose of IV iron on 6/19, start oral iron supplementation Daily CBC   Diabetes mellitus type 2 Last A1c 6.2 on 06/2023 SSI, Accu-Cheks, Semglee , hypoglycemic protocol   Hyponatremia  Improving Daily BMP   OSA on CPAP Continue CPAP   Anxiety, insomnia  Continue ativan  and trazodone      Single non-displaced rib fracture  Pain-control, incentive spirometry   Class I obesity Lifestyle modification advised    Estimated body mass index is 34.91 kg/m as calculated from the following:   Height as of this encounter: 6' 2 (1.88 m).   Weight as of this encounter: 123.3 kg.     Code Status: Full  Family Communication: Discussed with daughter on 08/15/2023  Disposition Plan: Status is: Inpatient Remains inpatient appropriate because: Level of care      Consultants: Cardiothoracic surgery Cardiology  Procedures: None  Antimicrobials: None  DVT prophylaxis: Eliquis    Objective: Vitals:   08/17/23 0003 08/17/23 0431 08/17/23 0757 08/17/23  1157  BP: 134/69 125/76 116/66 117/69  Pulse: 86 87  86  Resp: 17 20  20   Temp: 98.4 F (36.9 C) (!) 97.5 F (36.4 C) 98.6 F (37 C) 97.6 F (36.4 C)  TempSrc: Oral Oral  Oral  SpO2:  98% 98%  95%  Weight:      Height:        Intake/Output Summary (Last 24 hours) at 08/17/2023 1645 Last data filed at 08/17/2023 0957 Gross per 24 hour  Intake 240 ml  Output 1800 ml  Net -1560 ml   Filed Weights   08/15/23 1316  Weight: 123.3 kg    Exam: General: NAD  Cardiovascular: S1, S2 present Respiratory: Bibasilar crackles noted Abdomen: Soft, nontender, nondistended, bowel sounds present Musculoskeletal: +bilateral pedal edema noted Skin: Noted bruising on right lower extremity thigh around vein harvest sites Psychiatry: Normal mood     Data Reviewed: CBC: Recent Labs  Lab 08/14/23 2200 08/15/23 0647 08/16/23 0328 08/17/23 0255  WBC 10.5 11.5* 9.4 7.7  HGB 11.0* 11.0* 10.7* 10.5*  HCT 34.2* 33.9* 32.1* 31.8*  MCV 90.0 90.9 89.7 88.1  PLT 559* 569* 513* 500*   Basic Metabolic Panel: Recent Labs  Lab 08/14/23 2200 08/15/23 0647 08/16/23 0328 08/17/23 0255 08/17/23 0945  NA 128* 130* 130* 130*  --   K 4.1 3.6 3.6 3.4*  --   CL 90* 93* 91* 88*  --   CO2 27 28 32 32  --   GLUCOSE 207* 141* 107* 106*  --   BUN 13 10 11 11   --   CREATININE 0.73 0.75 0.80 0.71  --   CALCIUM  8.3* 7.9* 8.3* 8.2*  --   MG  --  2.1  --   --  2.0   GFR: Estimated Creatinine Clearance: 116.4 mL/min (by C-G formula based on SCr of 0.71 mg/dL). Liver Function Tests: No results for input(s): AST, ALT, ALKPHOS, BILITOT, PROT, ALBUMIN  in the last 168 hours. No results for input(s): LIPASE, AMYLASE in the last 168 hours. No results for input(s): AMMONIA in the last 168 hours. Coagulation Profile: No results for input(s): INR, PROTIME in the last 168 hours. Cardiac Enzymes: No results for input(s): CKTOTAL, CKMB, CKMBINDEX, TROPONINI in the last 168 hours. BNP (last 3 results) No results for input(s): PROBNP in the last 8760 hours. HbA1C: No results for input(s): HGBA1C in the last 72 hours. CBG: Recent Labs  Lab 08/16/23 2002  08/17/23 0002 08/17/23 0430 08/17/23 0954 08/17/23 1205  GLUCAP 195* 152* 116* 161* 134*   Lipid Profile: No results for input(s): CHOL, HDL, LDLCALC, TRIG, CHOLHDL, LDLDIRECT in the last 72 hours. Thyroid Function Tests: No results for input(s): TSH, T4TOTAL, FREET4, T3FREE, THYROIDAB in the last 72 hours. Anemia Panel: Recent Labs    08/16/23 0328  VITAMINB12 975*  FOLATE >40.0  FERRITIN 99  TIBC 305  IRON 34*   Urine analysis:    Component Value Date/Time   COLORURINE YELLOW 08/08/2023 0916   APPEARANCEUR HAZY (A) 08/08/2023 0916   LABSPEC 1.014 08/08/2023 0916   PHURINE 6.0 08/08/2023 0916   GLUCOSEU NEGATIVE 08/08/2023 0916   HGBUR SMALL (A) 08/08/2023 0916   BILIRUBINUR NEGATIVE 08/08/2023 0916   KETONESUR NEGATIVE 08/08/2023 0916   PROTEINUR NEGATIVE 08/08/2023 0916   UROBILINOGEN 1.0 12/21/2012 1341   NITRITE NEGATIVE 08/08/2023 0916   LEUKOCYTESUR LARGE (A) 08/08/2023 0916   Sepsis Labs: @LABRCNTIP (procalcitonin:4,lacticidven:4)  ) Recent Results (from the past 240 hours)  Urine Culture (for pregnant, neutropenic  or urologic patients or patients with an indwelling urinary catheter)     Status: Abnormal   Collection Time: 08/08/23  6:16 PM   Specimen: Urine, Clean Catch  Result Value Ref Range Status   Specimen Description URINE, CLEAN CATCH  Final   Special Requests NONE  Final   Culture (A)  Final    <10,000 COLONIES/mL INSIGNIFICANT GROWTH Performed at [facility], [address], [place], [place] [address]    Report Status 08/10/2023 FINAL  Final      Studies: DG Chest 2 View Result Date: 08/17/2023 CLINICAL DATA:  Dyspnea and diastolic heart failure EXAM: CHEST - 2 VIEW COMPARISON:  08/14/2023 FINDINGS: Median sternotomy for CABG.  Valve repair. Midline trachea. Cardiomegaly accentuated by AP portable technique. Small bilateral pleural effusions. No pneumothorax. Mild interstitial edema is not significantly changed.  Left greater than right base airspace disease is similar given differences in technique. IMPRESSION: 1. Cardiomegaly with similar mild congestive heart failure. 2. Similar bibasilar airspace disease, favoring atelectasis. Electronically Signed   By: Lore Rode M.D.   On: 08/17/2023 13:22     Scheduled Meds:  ALPRAZolam   0.25 mg Oral BID   apixaban   5 mg Oral BID   aspirin  EC  81 mg Oral Daily   azelastine  1 spray Each Nare BID   carvedilol   12.5 mg Oral BID   feeding supplement  237 mL Oral BID BM   ferrous gluconate  324 mg Oral Q breakfast   fluticasone   2 spray Each Nare BID   furosemide   40 mg Intravenous BID   insulin  aspart  0-6 Units Subcutaneous Q4H   insulin  glargine-yfgn  20 Units Subcutaneous Daily   ipratropium-albuterol   3 mL Nebulization TID   olopatadine   1 drop Both Eyes Q12H   polyethylene glycol  34 g Oral QHS   potassium chloride   40 mEq Oral BID   sodium chloride  flush  3 mL Intravenous Q12H   terazosin   2 mg Oral QHS   [START ON 08/18/2023] Tiotropium Bromide  Monohydrate  2 puff Inhalation Daily   traZODone   100 mg Oral QHS    Continuous Infusions:   LOS: 2 days     Veronica Gordon, MD [facility]  If 7PM-7AM, please contact night-coverage [email or web address] 08/17/2023, 4:45 PM

## 2023-08-17 NOTE — Evaluation (Signed)
 Occupational Therapy Evaluation Patient Details Name: [name]. MRN: [id number] DOB: 03/12/1950 Today's Date: 08/17/2023   History of Present Illness   Donavin Audino. is a 73 y.o. male admitted 08/14/23 for CHF exacerbation. PMHx: HTN, T2DM, chronic diastolic CHF, PAF on Eliquis , obesity, OSA, and CAD s/p CABG x3 on 07/30/2023.     Clinical Impressions Pt admitted based on above, and was seen based on problem list below. Prior to CABG was independent with ADLs and IADLs. Since procedure pt was completing STR at [facility]. Today pt is requiring set up  to total for ADLs. Bed mobility was mod assist and functional transfers are up to  mod assist depending on surface height. Pt recalling precautions but difficulty maintaining precautions functionally, often verbalizing  I can't.  Pt would benefit from returning to <3 hours of skilled rehab daily. OT will continue to follow acutely to maximize functional independence.        If plan is discharge home, recommend the following:   A little help with walking and/or transfers;A lot of help with bathing/dressing/bathroom;Help with stairs or ramp for entrance     Functional Status Assessment   Patient has had a recent decline in their functional status and demonstrates the ability to make significant improvements in function in a reasonable and predictable amount of time.     Equipment Recommendations   Other (comment) (Defer to next venue)      Precautions/Restrictions   Precautions Precautions: Sternal;Fall Precaution Booklet Issued: No (Has his from previous admission) Recall of Precautions/Restrictions: Impaired Precaution/Restrictions Comments: Pt remembered move in the tube, but required cues for to maintain functionally reporting  I can't Restrictions Weight Bearing Restrictions Per Provider Order: No     Mobility Bed Mobility Overal bed mobility: Needs Assistance Bed Mobility: Rolling, Sidelying to  Sit Rolling: Contact guard assist Sidelying to sit: Mod assist, HOB elevated, Used rails       General bed mobility comments: At pt request HOB highly elevated, mod assist for trunk    Transfers Overall transfer level: Needs assistance Equipment used: Rolling walker (2 wheels) Transfers: Sit to/from Stand, Bed to chair/wheelchair/BSC Sit to Stand: From elevated surface, Contact guard assist, Mod assist     Step pivot transfers: Contact guard assist     General transfer comment: From highly elevated surface CGA, regular height, up tp mod assist, once in standing CGA      Balance Overall balance assessment: Needs assistance Sitting-balance support: Feet supported Sitting balance-Leahy Scale: Good     Standing balance support: Bilateral upper extremity supported, During functional activity, Reliant on assistive device for balance Standing balance-Leahy Scale: Poor Standing balance comment: Pt dependent on RW and CGA for stability       ADL either performed or assessed with clinical judgement   ADL Overall ADL's : Needs assistance/impaired Eating/Feeding: Independent;Sitting   Grooming: Contact guard assist;Standing;Wash/dry face;Oral care Grooming Details (indicate cue type and reason): CGA for balance Upper Body Bathing: Sitting;Set up   Lower Body Bathing: Moderate assistance;Sit to/from stand   Upper Body Dressing : Sitting;Set up   Lower Body Dressing: Moderate assistance;Sit to/from stand   Toilet Transfer: Minimal assistance;Ambulation;Rolling walker (2 wheels);BSC/3in1 Toilet Transfer Details (indicate cue type and reason): Assist to rise, while maintaining precautions Toileting- Clothing Manipulation and Hygiene: Total assistance;Sit to/from stand Toileting - Clothing Manipulation Details (indicate cue type and reason): total assist to wipe in standing     Functional mobility during ADLs: Minimal assistance;Rolling walker (2 wheels)  General ADL Comments:  pt recalling technique for UB dressing, difficulty reaching BLEs for LB ADLs     Vision Baseline Vision/History: 1 Wears glasses Vision Assessment?: No apparent visual deficits            Pertinent Vitals/Pain Pain Assessment Pain Assessment: 0-10 Pain Score: 8  Pain Location: chest Pain Descriptors / Indicators: Pressure Pain Intervention(s): Monitored during session, Repositioned, Patient requesting pain meds-RN notified     Extremity/Trunk Assessment Upper Extremity Assessment Upper Extremity Assessment: Overall WFL for tasks assessed   Lower Extremity Assessment Lower Extremity Assessment: Defer to PT evaluation       Communication Communication Communication: No apparent difficulties   Cognition Arousal: Alert Behavior During Therapy: WFL for tasks assessed/performed Cognition: No apparent impairments   Following commands: Intact       Cueing  General Comments   Cueing Techniques: Verbal cues  Pt received on CPAP, session completed on 3L [place], replaced on CPAP at pt's request           Home Living Family/patient expects to be discharged to:: Skilled nursing facility Living Arrangements: Spouse/significant other Available Help at Discharge: Family;Available 24 hours/day Type of Home: House Home Access: Stairs to enter [employer] of Steps: 1 Entrance Stairs-Rails: None Home Layout: One level     Bathroom Shower/Tub: Producer, television/film/video: Handicapped height     Home Equipment: Hand held shower head;Shower Counsellor (2 wheels);Rollator (4 wheels);Cane - quad;Cane - single point   Additional Comments: Cares for wife who has Parkinson's (provides PRN physical assistance) & daughters assist him daily  Lives With: Spouse    Prior Functioning/Environment Prior Level of Function : Independent/Modified Independent;Driving;Working/employed             Mobility Comments: Independent, ambulates without AD. Able to  negotiate 3 flights of stairs at work with increased time and some difficultlty. Denies falls. 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 Indep with ADLs. Cares for his wife with Parkinson's, providing PRN physical assist.    OT Problem List: Decreased strength;Decreased activity tolerance;Impaired balance (sitting and/or standing);Decreased knowledge of use of DME or AE;Decreased knowledge of precautions;Obesity;Pain   OT Treatment/Interventions: Self-care/ADL training;Energy conservation;DME and/or AE instruction;Therapeutic activities;Patient/family education;Balance training      OT Goals(Current goals can be found in the care plan section)   Acute Rehab OT Goals Patient Stated Goal: To rest OT Goal Formulation: With patient Time For Goal Achievement: 08/31/23 Potential to Achieve Goals: Good   OT Frequency:  Min 2X/week       AM-PAC OT 6 Clicks Daily Activity     Outcome Measure Help from another person eating meals?: None Help from another person taking care of personal grooming?: A Little Help from another person toileting, which includes using toliet, bedpan, or urinal?: Total Help from another person bathing (including washing, rinsing, drying)?: A Lot Help from another person to put on and taking off regular upper body clothing?: A Lot Help from another person to put on and taking off regular lower body clothing?: A Lot 6 Click Score: 14   End of Session Equipment Utilized During Treatment: Gait belt;Rolling walker (2 wheels) Nurse Communication: Mobility status  Activity Tolerance: Patient tolerated treatment well Patient left: with call bell/phone within reach  OT Visit Diagnosis: Unsteadiness on feet (R26.81);Other abnormalities of gait and mobility (R26.89);Pain;Muscle weakness (generalized) (M62.81);Other (comment)                Time: 2841-3244 OT  Time Calculation (min): 42 min Charges:  OT General Charges $OT  Visit: 1 Visit OT Evaluation $OT Eval Moderate Complexity: 1 Mod OT Treatments $Self Care/Home Management : 23-37 mins  Delmer Ferraris, OT  Acute Rehabilitation Services Office [phone] Secure chat preferred    Mickael Alamo 08/17/2023, 8:47 AM

## 2023-08-17 NOTE — Progress Notes (Addendum)
      [address]       [name] [address]             [phone]          Subjective: Awake, trying to eat breakfast.  [name] he is still feeling short of breath with no improvement and feels his breathing is restricted due to pain across his anterior chest. He is avoiding the opioid analgesics because he is afraid they will make his breathing more difficult.   Walked a short distance with PT yesterday.  Objective: Vital signs in last 24 hours: Temp:  [97.5 F (36.4 C)-98.5 F (36.9 C)] 97.5 F (36.4 C) (06/20 0431) Pulse Rate:  [79-92] 87 (06/20 0431) Cardiac Rhythm: Atrial flutter (06/19 2034) Resp:  [17-25] 20 (06/20 0431) BP: (103-135)/(56-85) 125/76 (06/20 0431) SpO2:  [95 %-100 %] 98 % (06/20 0431)     Intake/Output from previous day: 06/19 0701 - 06/20 0700 In: 1070 [P.O.:960; IV Piggyback:110] Out: 2200 [Urine:2200] Intake/Output this shift: No intake/output data recorded.  General appearance: alert, cooperative, and mild distress Neurologic: intact Heart: A-flutter with CVR Lungs: O2 sat 99% on CPAP. Normal respiratory effort at rest. Breath sounds clear but shallow.   Abdomen: soft, no tenderness, active bowel sounds.  Extremities: Expected bruising right thigh, LE edema slowly improving. Wound: the sternotomy incision is intact and dry.   Lab Results: Recent Labs    08/16/23 0328 08/17/23 0255  WBC 9.4 7.7  HGB 10.7* 10.5*  HCT 32.1* 31.8*  PLT 513* 500*   BMET:  Recent Labs    08/16/23 0328 08/17/23 0255  NA 130* 130*  K 3.6 3.4*  CL 91* 88*  CO2 32 32  GLUCOSE 107* 106*  BUN 11 11  CREATININE 0.80 0.71  CALCIUM  8.3* 8.2*    PT/INR: No results for input(s): LABPROT, INR in the last 72 hours. ABG    Component Value Date/Time   PHART 7.330 (L) 07/31/2023 0134   HCO3 24.8 07/31/2023 0134   TCO2 26 07/31/2023 0134   ACIDBASEDEF 1.0 07/31/2023 0134   O2SAT 97 07/31/2023 0134   CBG (last 3)  Recent Labs     08/16/23 2002 08/17/23 0002 08/17/23 0430  GLUCAP 195* 152* 116*    Assessment/Plan:  -Re-admission post CABG with progressively worsening shortness of breath. CT was negative for PE but showed small bilateral pleural effusions and ATX in both lower lobes. Echo this admission showing EF 50-55%, elevated EDP, RV was not well visualized.   Sx initially improved with diuresis.  He is is now saying he thinks his breathing is restricted by chest wall pain.  He does not want to take opioids but agreed to try tramadol  along with the Tylenol  today.  Agree with continued diuresis.  Continuing to encourage pulmonary hygiene and mobilization.       LOS: 2 days    Alan G. Roddenberry, PA-C 08/17/2023 Looks better this afternoon and has taken a couple of walks but still c/o inability to take a deep breath. Still volume overloaded- continue diuresis Have asked Cardiology to see re: meds- was on Entresto  at some point but also question of angioedema to ACE-I  Alan Schubring C. Luna Salinas, MD [facility] [phone]

## 2023-08-18 DIAGNOSIS — I483 Typical atrial flutter: Secondary | ICD-10-CM

## 2023-08-18 DIAGNOSIS — I5033 Acute on chronic diastolic (congestive) heart failure: Secondary | ICD-10-CM | POA: Diagnosis not present

## 2023-08-18 LAB — BASIC METABOLIC PANEL WITH GFR
Anion gap: 10 (ref 5–15)
BUN: 13 mg/dL (ref 8–23)
CO2: 30 mmol/L (ref 22–32)
Calcium: 8.4 mg/dL — ABNORMAL LOW (ref 8.9–10.3)
Chloride: 90 mmol/L — ABNORMAL LOW (ref 98–111)
Creatinine, Ser: 0.85 mg/dL (ref 0.61–1.24)
GFR, Estimated: >60 mL/min (ref >60)
Glucose, Bld: 122 mg/dL — ABNORMAL HIGH (ref 70–99)
Potassium: 4.1 mmol/L (ref 3.5–5.1)
Sodium: 130 mmol/L — ABNORMAL LOW (ref 135–145)

## 2023-08-18 LAB — GLUCOSE, CAPILLARY
Glucose-Capillary: 136 mg/dL — ABNORMAL HIGH (ref 70–99)
Glucose-Capillary: 166 mg/dL — ABNORMAL HIGH (ref 70–99)
Glucose-Capillary: 137 mg/dL — ABNORMAL HIGH (ref 70–99)
Glucose-Capillary: 183 mg/dL — ABNORMAL HIGH (ref 70–99)
Glucose-Capillary: 174 mg/dL — ABNORMAL HIGH (ref 70–99)

## 2023-08-18 LAB — CBC
HCT: 32.2 % — ABNORMAL LOW (ref 39.0–52.0)
Hemoglobin: 10.4 g/dL — ABNORMAL LOW (ref 13.0–17.0)
MCH: 28.7 pg (ref 26.0–34.0)
MCHC: 32.3 g/dL (ref 30.0–36.0)
MCV: 89 fL (ref 80.0–100.0)
Platelets: 465 10*3/uL — ABNORMAL HIGH (ref 150–400)
RBC: 3.62 MIL/uL — ABNORMAL LOW (ref 4.22–5.81)
RDW: 14.1 % (ref 11.5–15.5)
WBC: 7.7 10*3/uL (ref 4.0–10.5)
nRBC: 0 % (ref 0.0–0.2)

## 2023-08-18 MED ORDER — EMPAGLIFLOZIN 10 MG PO TABS
10.0000 mg | ORAL_TABLET | Freq: Every day | ORAL | Status: DC
  Administered 2023-08-18 – 2023-08-27 (×10): 10 mg via ORAL
  Filled 2023-08-18 (×10): qty 1

## 2023-08-18 NOTE — Progress Notes (Signed)
 DAILY PROGRESS NOTE   Patient Name: [name]. Date of Encounter: 08/18/2023 Cardiologist: None  Chief Complaint   Breathing was reportedly difficult today  Patient Profile   [name]. is a 73 y.o. male with a hx of PAF on eliquis , HFpEF, alcohol use, cannabis use, hx of tobacco use, HLD with statin intolerance, COPD, DM2, CKD2, OSA on CPAP, obesity, remote hx of ICH, provoked DVT who is being seen 08/17/2023 for the evaluation of CHF medication adjustments at the request of Dr. Donnamarie.   Subjective   Net negative 2L overnight- now 6.2L negative. On amiodarone , but remains in rate-controlled atrial flutter. Creatinine stable at 0.85.  Objective   Vitals:   08/17/23 2205 08/17/23 2359 08/18/23 0447 08/18/23 0918  BP:  116/65 128/68 124/60  Pulse: 87 69 67 84  Resp: (!) 22 20 20 20   Temp:  98.1 F (36.7 C) 97.9 F (36.6 C) 98.1 F (36.7 C)  TempSrc:  Oral Oral Oral  SpO2: 99% 98% 100% 98%  Weight:      Height:        Intake/Output Summary (Last 24 hours) at 08/18/2023 9062 Last data filed at 08/18/2023 0447 Gross per 24 hour  Intake 619.28 ml  Output 2700 ml  Net -2080.72 ml   Filed Weights   08/15/23 1316  Weight: 123.3 kg    Physical Exam   General appearance: alert, no distress, moderately obese, and pale Neck: JVD - 4 cm above sternal notch, no carotid bruit, and thyroid not enlarged, symmetric, no tenderness/mass/nodules Lungs: diminished breath sounds bibasilar Heart: irregularly irregular rhythm Extremities: edema trace sockline Neurologic: Grossly normal   Inpatient Medications    Scheduled Meds:  ALPRAZolam   0.25 mg Oral BID   apixaban   5 mg Oral BID   aspirin  EC  81 mg Oral Daily   azelastine   1 spray Each Nare BID   carvedilol   3.125 mg Oral BID   feeding supplement  237 mL Oral BID BM   ferrous gluconate   324 mg Oral Q breakfast   fluticasone   2 spray Each Nare BID   furosemide   80 mg Intravenous BID   insulin  aspart   0-6 Units Subcutaneous Q4H   insulin  glargine-yfgn  20 Units Subcutaneous Daily   ipratropium-albuterol   3 mL Nebulization TID   olopatadine   1 drop Both Eyes Q12H   polyethylene glycol  34 g Oral QHS   sodium chloride  flush  3 mL Intravenous Q12H   spironolactone   12.5 mg Oral Daily   terazosin   2 mg Oral QHS   Tiotropium Bromide  Monohydrate  2 puff Inhalation Daily   traZODone   100 mg Oral QHS    Continuous Infusions:  amiodarone  30 mg/hr (08/18/23 0811)    PRN Meds: acetaminophen  **OR** acetaminophen , fentaNYL  (SUBLIMAZE ) injection, hydrOXYzine , senna-docusate, traMADol , trimethobenzamide   Labs   Results for orders placed or performed during the hospital encounter of 08/14/23 (from the past 48 hours)  Glucose, capillary     Status: Abnormal   Collection Time: 08/16/23 10:11 AM  Result Value Ref Range   Glucose-Capillary 167 (H) 70 - 99 mg/dL    Comment: Glucose reference range applies only to samples taken after fasting for at least 8 hours.  Glucose, capillary     Status: Abnormal   Collection Time: 08/16/23 12:02 PM  Result Value Ref Range   Glucose-Capillary 139 (H) 70 - 99 mg/dL    Comment: Glucose reference range applies only to samples  taken after fasting for at least 8 hours.  Glucose, capillary     Status: Abnormal   Collection Time: 08/16/23  5:18 PM  Result Value Ref Range   Glucose-Capillary 170 (H) 70 - 99 mg/dL    Comment: Glucose reference range applies only to samples taken after fasting for at least 8 hours.  Glucose, capillary     Status: Abnormal   Collection Time: 08/16/23  8:02 PM  Result Value Ref Range   Glucose-Capillary 195 (H) 70 - 99 mg/dL    Comment: Glucose reference range applies only to samples taken after fasting for at least 8 hours.   Comment 1 Notify RN    Comment 2 Document in Chart   Glucose, capillary     Status: Abnormal   Collection Time: 08/17/23 12:02 AM  Result Value Ref Range   Glucose-Capillary 152 (H) 70 - 99 mg/dL     Comment: Glucose reference range applies only to samples taken after fasting for at least 8 hours.   Comment 1 Notify RN    Comment 2 Document in Chart   Basic metabolic panel     Status: Abnormal   Collection Time: 08/17/23  2:55 AM  Result Value Ref Range   Sodium 130 (L) 135 - 145 mmol/L   Potassium 3.4 (L) 3.5 - 5.1 mmol/L   Chloride 88 (L) 98 - 111 mmol/L   CO2 32 22 - 32 mmol/L   Glucose, Bld 106 (H) 70 - 99 mg/dL    Comment: Glucose reference range applies only to samples taken after fasting for at least 8 hours.   BUN 11 8 - 23 mg/dL   Creatinine, Ser 9.28 0.61 - 1.24 mg/dL   Calcium  8.2 (L) 8.9 - 10.3 mg/dL   GFR, Estimated >39 >39 mL/min    Comment: (NOTE) Calculated using the CKD-EPI Creatinine Equation (2021)    Anion gap 10 5 - 15    Comment: Performed at [facility], [address], [place], [place] [address]  CBC     Status: Abnormal   Collection Time: 08/17/23  2:55 AM  Result Value Ref Range   WBC 7.7 4.0 - 10.5 K/uL   RBC 3.61 (L) 4.22 - 5.81 MIL/uL   Hemoglobin 10.5 (L) 13.0 - 17.0 g/dL   HCT 68.1 (L) 60.9 - 47.9 %   MCV 88.1 80.0 - 100.0 fL   MCH 29.1 26.0 - 34.0 pg   MCHC 33.0 30.0 - 36.0 g/dL   RDW 86.1 88.4 - 84.4 %   Platelets 500 (H) 150 - 400 K/uL   nRBC 0.0 0.0 - 0.2 %    Comment: Performed at [facility], [address] [place], [place] [address]  Glucose, capillary     Status: Abnormal   Collection Time: 08/17/23  4:30 AM  Result Value Ref Range   Glucose-Capillary 116 (H) 70 - 99 mg/dL    Comment: Glucose reference range applies only to samples taken after fasting for at least 8 hours.   Comment 1 Notify RN    Comment 2 Document in Chart   Magnesium      Status: None   Collection Time: 08/17/23  9:45 AM  Result Value Ref Range   Magnesium  2.0 1.7 - 2.4 mg/dL    Comment: Performed at [facility], [address] [place], [place] [address]  Glucose, capillary     Status: Abnormal   Collection Time: 08/17/23  9:54 AM   Result Value  Ref Range   Glucose-Capillary 161 (H) 70 - 99 mg/dL    Comment: Glucose reference range applies only to samples taken after fasting for at least 8 hours.  Glucose, capillary     Status: Abnormal   Collection Time: 08/17/23 12:05 PM  Result Value Ref Range   Glucose-Capillary 134 (H) 70 - 99 mg/dL    Comment: Glucose reference range applies only to samples taken after fasting for at least 8 hours.  Glucose, capillary     Status: Abnormal   Collection Time: 08/17/23  4:54 PM  Result Value Ref Range   Glucose-Capillary 160 (H) 70 - 99 mg/dL    Comment: Glucose reference range applies only to samples taken after fasting for at least 8 hours.  Glucose, capillary     Status: Abnormal   Collection Time: 08/17/23  8:44 PM  Result Value Ref Range   Glucose-Capillary 181 (H) 70 - 99 mg/dL    Comment: Glucose reference range applies only to samples taken after fasting for at least 8 hours.  Glucose, capillary     Status: Abnormal   Collection Time: 08/17/23 11:57 PM  Result Value Ref Range   Glucose-Capillary 147 (H) 70 - 99 mg/dL    Comment: Glucose reference range applies only to samples taken after fasting for at least 8 hours.   Comment 1 Notify RN    Comment 2 Document in Chart   Basic metabolic panel     Status: Abnormal   Collection Time: 08/18/23  4:01 AM  Result Value Ref Range   Sodium 130 (L) 135 - 145 mmol/L   Potassium 4.1 3.5 - 5.1 mmol/L   Chloride 90 (L) 98 - 111 mmol/L   CO2 30 22 - 32 mmol/L   Glucose, Bld 122 (H) 70 - 99 mg/dL    Comment: Glucose reference range applies only to samples taken after fasting for at least 8 hours.   BUN 13 8 - 23 mg/dL   Creatinine, Ser 9.14 0.61 - 1.24 mg/dL   Calcium  8.4 (L) 8.9 - 10.3 mg/dL   GFR, Estimated >39 >39 mL/min    Comment: (NOTE) Calculated using the CKD-EPI Creatinine Equation (2021)    Anion gap 10 5 - 15    Comment: Performed at [facility], [address], [place], [place] [address]  CBC      Status: Abnormal   Collection Time: 08/18/23  4:01 AM  Result Value Ref Range   WBC 7.7 4.0 - 10.5 K/uL   RBC 3.62 (L) 4.22 - 5.81 MIL/uL   Hemoglobin 10.4 (L) 13.0 - 17.0 g/dL   HCT 67.7 (L) 60.9 - 47.9 %   MCV 89.0 80.0 - 100.0 fL   MCH 28.7 26.0 - 34.0 pg   MCHC 32.3 30.0 - 36.0 g/dL   RDW 85.8 88.4 - 84.4 %   Platelets 465 (H) 150 - 400 K/uL   nRBC 0.0 0.0 - 0.2 %    Comment: Performed at [facility], [address] [place], [place] [address]  Glucose, capillary     Status: Abnormal   Collection Time: 08/18/23  4:46 AM  Result Value Ref Range   Glucose-Capillary 136 (H) 70 - 99 mg/dL    Comment: Glucose reference range applies only to samples taken after fasting for at least 8 hours.   Comment 1 Notify RN    Comment 2 Document in Chart   Glucose, capillary     Status: Abnormal   Collection  Time: 08/18/23  9:15 AM  Result Value Ref Range   Glucose-Capillary 166 (H) 70 - 99 mg/dL    Comment: Glucose reference range applies only to samples taken after fasting for at least 8 hours.   Comment 1 Notify RN    Comment 2 Document in Chart     ECG   N/A  Telemetry   Rate controlled atrial flutter - Personally Reviewed  Radiology    DG Chest 2 View Result Date: 08/17/2023 CLINICAL DATA:  Dyspnea and diastolic heart failure EXAM: CHEST - 2 VIEW COMPARISON:  08/14/2023 FINDINGS: Median sternotomy for CABG.  Valve repair. Midline trachea. Cardiomegaly accentuated by AP portable technique. Small bilateral pleural effusions. No pneumothorax. Mild interstitial edema is not significantly changed. Left greater than right base airspace disease is similar given differences in technique. IMPRESSION: 1. Cardiomegaly with similar mild congestive heart failure. 2. Similar bibasilar airspace disease, favoring atelectasis. Electronically Signed   By: Rockey Kilts M.D.   On: 08/17/2023 13:22    Cardiac Studies   N/A  Assessment   Principal Problem:   Acute on chronic diastolic CHF  (congestive heart failure) (HCC) Active Problems:   Essential hypertension, benign   Type 2 diabetes mellitus with hyperglycemia, with long-term current use of insulin  (HCC)   Sleep apnea   Hyponatremia   COPD (chronic obstructive pulmonary disease) (HCC)   Coronary artery disease involving native coronary artery of native heart without angina pectoris   Paroxysmal atrial fibrillation (HCC)   Generalized anxiety disorder   Plan   Mr. Lembke has still been dyspneic, however, had a good response to increased dose IV lasix  - will continue today. Continue IV amiodarone  to fully load and then transition to oral amiodarone  tomorrow. Suspect he will need TEE/DCCV early this coming week. Will restart jardiance  10 mg daily today. Continue pulmonary toileting.  Time Spent Directly with Patient:  I have spent a total of 25 minutes with the patient reviewing hospital notes, telemetry, EKGs, labs and examining the patient as well as establishing an assessment and plan that was discussed personally with the patient.  > 50% of time was spent in direct patient care.  Length of Stay:  LOS: 3 days   Vinie KYM Maxcy, MD, [facility], FNLA, FACP  [place]  [facility]  Medical Director of the [facility]  [facility] Diplomate of the American Board of Clinical Lipidology Attending Cardiologist  Direct Dial: [phone]  Fax: [phone]  Website:  [email or web address] Vinie JAYSON Maxcy 08/18/2023, 9:37 AM

## 2023-08-18 NOTE — Plan of Care (Signed)

## 2023-08-18 NOTE — Progress Notes (Signed)
 PROGRESS NOTE  [name]. FMW:[id number] DOB: 04-20-1950 DOA: 08/14/2023 PCP: [facility]  HPI/Recap of past 24 hours: Alan Kaps. is a 73 y.o. 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 Pt was told to hold his lasix  2/2 ?hyponatremia prior to d/c to SNF. Pt was placed on CPAP with EMS. Upon arrival to the ED, patient is found to be afebrile, on CPAP with tachypnea, elevated BP.  Labs are most notable for sodium 128, troponin 83, and BNP 489.  CTA chest is negative for PE but notable for nondisplaced right posterior first rib fracture and small bilateral pleural effusions with partial loculation on the left. Cardiothoracic surgery was consulted by the EDP.  Patient admitted for further management.    Today, patient still reporting some shortness of breath, although noted some improvement.  Denies any chest pain, abdominal pain, nausea/vomiting, fever/chills.  Patient has been able to ambulate in the hallway on 3 L of O2.   Assessment/Plan: Principal Problem:   Acute on chronic diastolic CHF (congestive heart failure) (HCC) Active Problems:   Essential hypertension, benign   Type 2 diabetes mellitus with hyperglycemia, with long-term current use of insulin  (HCC)   Sleep apnea   Hyponatremia   COPD (chronic obstructive pulmonary disease) (HCC)   Coronary artery disease involving native coronary artery of native heart without angina pectoris   Paroxysmal atrial fibrillation (HCC)   Generalized anxiety disorder   Acute on chronic HFpEF; pleural effusions   Currently on 3 L of O2 nasal cannula BNP 488.7, troponin 82->80, flat trend EKG noted atrial flutter, rate controlled, noted QTc prolongation Repeat echo with EF of 50  to 55%, noted regional wall motion abnormality CTA chest with no evidence of PE, no pneumothorax, small bilateral pleural effusion, no frank interstitial edema Chest x-ray with cardiomegaly with mild congestive HF Continue diuresing with IV Lasix  Cardiothoracic surgery consulted for further management Cardiology consulted, appreciate recs, increased Lasix , metolazone  prn, continue decreased dose of Coreg , spironolactone , Jardiance  Monitor weight and I/Os, monitor renal function and electrolytes  Hypokalemia Replace as needed   CAD s/p CABG  S/p CABG on 07/30/23 No anginal symptoms  Continue ASA and beta-blocker    PAF QTc prolongation EKG showed atrial flutter, QTc prolongation Continue IV amiodarone  Continue Eliquis , Coreg   Cardiology on board, may require cardioversion early next week  Normocytic anemia Hemoglobin around baseline Anemia panel showed iron  34, sats 11 Gave 1 dose of IV iron  on 6/19, start oral iron  supplementation Daily CBC   Diabetes mellitus type 2 Last A1c 6.2 on 06/2023 SSI, Accu-Cheks, Semglee , hypoglycemic protocol   Hyponatremia  Improving Daily BMP   OSA on CPAP Continue CPAP   Anxiety, insomnia  Continue ativan  and trazodone      Single non-displaced rib fracture  Pain-control, incentive spirometry   Class I obesity Lifestyle modification advised    Estimated body mass index is 34.91 kg/m as calculated from the following:   Height as of this encounter: 6' 2 (1.88 m).   Weight as of this encounter: 123.3 kg.     Code Status: Full  Family Communication: Discussed with daughter on 08/15/2023  Disposition Plan: Status is: Inpatient Remains inpatient appropriate because: Level of care      Consultants:  Cardiothoracic surgery Cardiology  Procedures: None  Antimicrobials: None  DVT prophylaxis: Eliquis    Objective: Vitals:   08/17/23 2359 08/18/23 0447 08/18/23 0918 08/18/23 1217  BP: 116/65 128/68 124/60 117/67   Pulse: 69 67 84 83  Resp: 20 20 20 20   Temp: 98.1 F (36.7 C) 97.9 F (36.6 C) 98.1 F (36.7 C) 98 F (36.7 C)  TempSrc: Oral Oral Oral Oral  SpO2: 98% 100% 98% 97%  Weight:      Height:        Intake/Output Summary (Last 24 hours) at 08/18/2023 1517 Last data filed at 08/18/2023 9188 Gross per 24 hour  Intake 619.28 ml  Output 3000 ml  Net -2380.72 ml   Filed Weights   08/15/23 1316  Weight: 123.3 kg    Exam: General: NAD  Cardiovascular: S1, S2 present Respiratory: Bibasilar crackles noted Abdomen: Soft, nontender, nondistended, bowel sounds present Musculoskeletal: +bilateral pedal edema noted Skin: Noted bruising on right lower extremity thigh around vein harvest sites Psychiatry: Normal mood     Data Reviewed: CBC: Recent Labs  Lab 08/14/23 2200 08/15/23 0647 08/16/23 0328 08/17/23 0255 08/18/23 0401  WBC 10.5 11.5* 9.4 7.7 7.7  HGB 11.0* 11.0* 10.7* 10.5* 10.4*  HCT 34.2* 33.9* 32.1* 31.8* 32.2*  MCV 90.0 90.9 89.7 88.1 89.0  PLT 559* 569* 513* 500* 465*   Basic Metabolic Panel: Recent Labs  Lab 08/14/23 2200 08/15/23 0647 08/16/23 0328 08/17/23 0255 08/17/23 0945 08/18/23 0401  NA 128* 130* 130* 130*  --  130*  K 4.1 3.6 3.6 3.4*  --  4.1  CL 90* 93* 91* 88*  --  90*  CO2 27 28 32 32  --  30  GLUCOSE 207* 141* 107* 106*  --  122*  BUN 13 10 11 11   --  13  CREATININE 0.73 0.75 0.80 0.71  --  0.85  CALCIUM  8.3* 7.9* 8.3* 8.2*  --  8.4*  MG  --  2.1  --   --  2.0  --    GFR: Estimated Creatinine Clearance: 109.6 mL/min (by C-G formula based on SCr of 0.85 mg/dL). Liver Function Tests: No results for input(s): AST, ALT, ALKPHOS, BILITOT, PROT, ALBUMIN  in the last 168 hours. No results for input(s): LIPASE, AMYLASE in the last 168 hours. No results for input(s): AMMONIA in the last 168 hours. Coagulation Profile: No results for input(s): INR, PROTIME in the last 168 hours. Cardiac Enzymes: No results for input(s):  CKTOTAL, CKMB, CKMBINDEX, TROPONINI in the last 168 hours. BNP (last 3 results) No results for input(s): PROBNP in the last 8760 hours. HbA1C: No results for input(s): HGBA1C in the last 72 hours. CBG: Recent Labs  Lab 08/17/23 2044 08/17/23 2357 08/18/23 0446 08/18/23 0915 08/18/23 1215  GLUCAP 181* 147* 136* 166* 137*   Lipid Profile: No results for input(s): CHOL, HDL, LDLCALC, TRIG, CHOLHDL, LDLDIRECT in the last 72 hours. Thyroid Function Tests: No results for input(s): TSH, T4TOTAL, FREET4, T3FREE, THYROIDAB in the last 72 hours. Anemia Panel: Recent Labs    08/16/23 0328  VITAMINB12 975*  FOLATE >40.0  FERRITIN 99  TIBC 305  IRON  34*   Urine analysis:    Component Value Date/Time   COLORURINE YELLOW 08/08/2023 0916   APPEARANCEUR HAZY (A) 08/08/2023 0916   LABSPEC 1.014 08/08/2023 0916   PHURINE 6.0 08/08/2023 0916   GLUCOSEU NEGATIVE 08/08/2023 0916   HGBUR SMALL (A) 08/08/2023 0916   BILIRUBINUR NEGATIVE 08/08/2023 0916   KETONESUR NEGATIVE 08/08/2023 9083  PROTEINUR NEGATIVE 08/08/2023 0916   UROBILINOGEN 1.0 12/21/2012 1341   NITRITE NEGATIVE 08/08/2023 0916   LEUKOCYTESUR LARGE (A) 08/08/2023 0916   Sepsis Labs: @LABRCNTIP (procalcitonin:4,lacticidven:4)  ) Recent Results (from the past 240 hours)  Urine Culture (for pregnant, neutropenic or urologic patients or patients with an indwelling urinary catheter)     Status: Abnormal   Collection Time: 08/08/23  6:16 PM   Specimen: Urine, Clean Catch  Result Value Ref Range Status   Specimen Description URINE, CLEAN CATCH  Final   Special Requests NONE  Final   Culture (A)  Final    <10,000 COLONIES/mL INSIGNIFICANT GROWTH Performed at [facility], [address], [place], [place] [address]    Report Status 08/10/2023 FINAL  Final      Studies: No results found.    Scheduled Meds:  ALPRAZolam   0.25 mg Oral BID   apixaban   5 mg Oral BID   aspirin  EC   81 mg Oral Daily   azelastine   1 spray Each Nare BID   carvedilol   3.125 mg Oral BID   empagliflozin   10 mg Oral Daily   feeding supplement  237 mL Oral BID BM   ferrous gluconate   324 mg Oral Q breakfast   fluticasone   2 spray Each Nare BID   furosemide   80 mg Intravenous BID   insulin  aspart  0-6 Units Subcutaneous Q4H   insulin  glargine-yfgn  20 Units Subcutaneous Daily   ipratropium-albuterol   3 mL Nebulization TID   olopatadine   1 drop Both Eyes Q12H   polyethylene glycol  34 g Oral QHS   sodium chloride  flush  3 mL Intravenous Q12H   spironolactone   12.5 mg Oral Daily   terazosin   2 mg Oral QHS   Tiotropium Bromide  Monohydrate  2 puff Inhalation Daily   traZODone   100 mg Oral QHS    Continuous Infusions:  amiodarone  30 mg/hr (08/18/23 0811)     LOS: 3 days     Lebron JINNY Cage, MD [facility]  If 7PM-7AM, please contact night-coverage [email or web address] 08/18/2023, 3:17 PM

## 2023-08-18 NOTE — Progress Notes (Addendum)
                  [address]           Thurmon BROCKS [place], [place] [address]                     [phone]            Subjective: Patient states very difficult to breath this am. He put CPAP back on as he felt [place] was not sufficient. Nurse and I adjusted patient (stuck in the middle of the bed).He has good oxygenation this am. He walked two times yesterday  Objective: Vital signs in last 24 hours: Temp:  [97.6 F (36.4 C)-98.6 F (37 C)] 97.9 F (36.6 C) (06/21 0447) Pulse Rate:  [67-88] 67 (06/21 0447) Cardiac Rhythm: Atrial flutter;Bundle branch block (06/20 1900) Resp:  [20-22] 20 (06/21 0447) BP: (116-138)/(65-72) 128/68 (06/21 0447) SpO2:  [95 %-100 %] 100 % (06/21 0447)   Current Weight  08/15/23 123.3 kg  Weight day of discharge (after surgery) was 122.9 kg    Intake/Output from previous day: 06/20 0701 - 06/21 0700 In: 619.3 [P.O.:400; I.V.:219.3] Out: 2700 [Urine:2700]   Physical Exam:  Cardiovascular: IRRR Pulmonary: Clear but shallow breath sounds bilaterally  Abdomen: Soft, non tender, bowel sounds present. Extremities: Mild bilateral lower extremity edema. Ecchymosis right thigh Wounds: Clean and dry.  No erythema or signs of infection.  Lab Results: CBC: Recent Labs    08/17/23 0255 08/18/23 0401  WBC 7.7 7.7  HGB 10.5* 10.4*  HCT 31.8* 32.2*  PLT 500* 465*   BMET:  Recent Labs    08/17/23 0255 08/18/23 0401  NA 130* 130*  K 3.4* 4.1  CL 88* 90*  CO2 32 30  GLUCOSE 106* 122*  BUN 11 13  CREATININE 0.71 0.85  CALCIUM  8.2* 8.4*    PT/INR:  Lab Results  Component Value Date   INR 1.4 (H) 07/30/2023   INR 0.9 07/27/2023   INR 0.98 01/09/2018   ABG:  INR: Will add last result for INR, ABG once components are confirmed Will add last 4 CBG results once components are confirmed  Assessment/Plan:  1. 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 He has been in  a flutter with CVR. On Amiodarone  drip, Coreg  3.125 mg bid and Apixaban  5 mg bid. With rate controlled, likely transition to oral Amiodarone . Per Dr. Nelle note yesterday, could consider DCCV but if done prior to Thursday, will need TEE. 2.  Pulmonary - History of OSA;on CPAP. On 3 liters of oxygen  via [place]. Wean as able. On Duo nebs and Tiotropium bromide  inhaler 3. Acute on chronic diastolic heart failure- Previously, on Lasix  40 mg IV bid and now on Lasix  80 mg IV bid and Spironolactone  12.5 mg daily. Appreciate Cardiology's assistance 4.  Expected post op acute blood loss anemia -H and H this am stable at 10.4 and 32.2. Continue [place]. 5. DM-CBGs 181/147/136. On Insulin . Pre op HGA1C 6.2 6. Persistent hyponatremia-sodium this am 130 7. Deconditioned-continue with PT 8. Disposition-once diuresed more and ambulation improves, will consider discharge  Kyla HERO ZimmermanPA-C 7:44 AM Patient seen and examined, agree with findings and plan outlined above  Elspeth C. Kerrin, MD [facility] [phone]

## 2023-08-18 NOTE — Progress Notes (Signed)
 Mobility Specialist Progress Note:    08/18/23 1151  Mobility  Activity Ambulated with assistance in hallway  Level of Assistance Minimal assist, patient does 75% or more  Assistive Device Front wheel walker  Distance Ambulated (ft) 80 ft  RUE Weight Bearing Per Provider Order NWB  LUE Weight Bearing Per Provider Order NWB  Activity Response Tolerated well  Mobility Referral Yes  Mobility visit 1 Mobility  Mobility Specialist Start Time (ACUTE ONLY) 1020  Mobility Specialist Stop Time (ACUTE ONLY) 1035  Mobility Specialist Time Calculation (min) (ACUTE ONLY) 15 min   Pt received in bed agreeable to mobility. Pt required MinA for bed mobility to maintain sternal precautions. No c/o throughout. Ambulated on 3L/min, VSS. Returned to room w/o fault. Left on 3L/min. Call bell and personal belongings in reach. All needs met. RN aware.  Thersia Minder Mobility Specialist  Please contact vis Secure Chat or  Rehab Office [phone]

## 2023-08-19 ENCOUNTER — Inpatient Hospital Stay (HOSPITAL_COMMUNITY)

## 2023-08-19 DIAGNOSIS — I5033 Acute on chronic diastolic (congestive) heart failure: Secondary | ICD-10-CM | POA: Diagnosis not present

## 2023-08-19 DIAGNOSIS — I483 Typical atrial flutter: Secondary | ICD-10-CM | POA: Diagnosis not present

## 2023-08-19 DIAGNOSIS — I480 Paroxysmal atrial fibrillation: Secondary | ICD-10-CM | POA: Diagnosis not present

## 2023-08-19 LAB — GLUCOSE, CAPILLARY
Glucose-Capillary: 101 mg/dL — ABNORMAL HIGH (ref 70–99)
Glucose-Capillary: 109 mg/dL — ABNORMAL HIGH (ref 70–99)
Glucose-Capillary: 129 mg/dL — ABNORMAL HIGH (ref 70–99)
Glucose-Capillary: 138 mg/dL — ABNORMAL HIGH (ref 70–99)
Glucose-Capillary: 139 mg/dL — ABNORMAL HIGH (ref 70–99)
Glucose-Capillary: 141 mg/dL — ABNORMAL HIGH (ref 70–99)

## 2023-08-19 LAB — CBC WITH DIFFERENTIAL/PLATELET
Abs Immature Granulocytes: 0.05 10*3/uL (ref 0.00–0.07)
Basophils Absolute: 0.1 10*3/uL (ref 0.0–0.1)
Basophils Relative: 1 %
Eosinophils Absolute: 0.1 10*3/uL (ref 0.0–0.5)
Eosinophils Relative: 2 %
HCT: 35.4 % — ABNORMAL LOW (ref 39.0–52.0)
Hemoglobin: 11.5 g/dL — ABNORMAL LOW (ref 13.0–17.0)
Immature Granulocytes: 1 %
Lymphocytes Relative: 13 %
Lymphs Abs: 1.1 10*3/uL (ref 0.7–4.0)
MCH: 28.5 pg (ref 26.0–34.0)
MCHC: 32.5 g/dL (ref 30.0–36.0)
MCV: 87.6 fL (ref 80.0–100.0)
Monocytes Absolute: 0.8 10*3/uL (ref 0.1–1.0)
Monocytes Relative: 9 %
Neutro Abs: 6.4 10*3/uL (ref 1.7–7.7)
Neutrophils Relative %: 74 %
Platelets: 525 10*3/uL — ABNORMAL HIGH (ref 150–400)
RBC: 4.04 MIL/uL — ABNORMAL LOW (ref 4.22–5.81)
RDW: 14 % (ref 11.5–15.5)
WBC: 8.4 10*3/uL (ref 4.0–10.5)
nRBC: 0 % (ref 0.0–0.2)

## 2023-08-19 LAB — BASIC METABOLIC PANEL WITH GFR
Anion gap: 10 (ref 5–15)
BUN: 13 mg/dL (ref 8–23)
CO2: 32 mmol/L (ref 22–32)
Calcium: 8.3 mg/dL — ABNORMAL LOW (ref 8.9–10.3)
Chloride: 86 mmol/L — ABNORMAL LOW (ref 98–111)
Creatinine, Ser: 0.94 mg/dL (ref 0.61–1.24)
GFR, Estimated: >60 mL/min (ref >60)
Glucose, Bld: 119 mg/dL — ABNORMAL HIGH (ref 70–99)
Potassium: 3.9 mmol/L (ref 3.5–5.1)
Sodium: 128 mmol/L — ABNORMAL LOW (ref 135–145)

## 2023-08-19 MED ORDER — SODIUM CHLORIDE 0.9 % IV SOLN
2.0000 g | Freq: Three times a day (TID) | INTRAVENOUS | Status: DC
  Administered 2023-08-19 – 2023-08-20 (×2): 2 g via INTRAVENOUS
  Filled 2023-08-19 (×3): qty 12.5

## 2023-08-19 MED ORDER — SODIUM CHLORIDE 0.9 % IV SOLN: INTRAVENOUS | Status: DC

## 2023-08-19 MED ORDER — AMIODARONE HCL 200 MG PO TABS
400.0000 mg | ORAL_TABLET | Freq: Two times a day (BID) | ORAL | Status: DC
  Administered 2023-08-19 – 2023-08-25 (×14): 400 mg via ORAL
  Filled 2023-08-19 (×14): qty 2

## 2023-08-19 MED ORDER — SENNOSIDES-DOCUSATE SODIUM 8.6-50 MG PO TABS
1.0000 | ORAL_TABLET | Freq: Every day | ORAL | Status: DC
  Administered 2023-08-19 – 2023-08-23 (×5): 1 via ORAL
  Filled 2023-08-19 (×8): qty 1

## 2023-08-19 MED ORDER — FUROSEMIDE 10 MG/ML IJ SOLN
80.0000 mg | Freq: Every day | INTRAMUSCULAR | Status: DC
  Filled 2023-08-19: qty 8

## 2023-08-19 NOTE — Progress Notes (Addendum)
 PROGRESS NOTE  [name]. FMW:[id number] DOB: 1950-12-23 DOA: 08/14/2023 PCP: Clinic, [name]  HPI/Recap of past 24 hours: [name]. is a 73 y.o. 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 Pt was told to hold his lasix  2/2 ?hyponatremia prior to d/c to SNF. Pt was placed on CPAP with EMS. Upon arrival to the ED, patient is found to be afebrile, on CPAP with tachypnea, elevated BP.  Labs are most notable for sodium 128, troponin 83, and BNP 489.  CTA chest is negative for PE but notable for nondisplaced right posterior first rib fracture and small bilateral pleural effusions with partial loculation on the left. Cardiothoracic surgery was consulted by the EDP.  Patient admitted for further management.    Today, patient still reporting some shortness of breath, although noted some improvement.  Denies any chest pain, abdominal pain, nausea/vomiting, fever/chills.  Patient has been able to ambulate in the hallway on 3 L of O2.   Assessment/Plan: Principal Problem:   Acute on chronic diastolic CHF (congestive heart failure) (HCC) Active Problems:   Essential hypertension, benign   Type 2 diabetes mellitus with hyperglycemia, with long-term current use of insulin  (HCC)   Sleep apnea   Hyponatremia   COPD (chronic obstructive pulmonary disease) (HCC)   Coronary artery disease involving native coronary artery of native heart without angina pectoris   Paroxysmal atrial fibrillation (HCC)   Generalized anxiety disorder   Acute on chronic HFpEF; pleural effusions   Currently on 3 L of O2 nasal cannula, plan to wean off as able BNP 488.7, troponin 82->80, flat trend EKG noted atrial flutter, rate controlled, noted QTc  prolongation Repeat echo with EF of 50 to 55%, noted regional wall motion abnormality CTA chest with no evidence of PE, no pneumothorax, small bilateral pleural effusion, no frank interstitial edema Repeat chest x-ray showed stable to slight interval worsening of left base opacification likely moderate effusion with associated atelectasis.  Infection in left base is possible Continue diuresing with IV Lasix  Cardiothoracic surgery consulted for further management Cardiology consulted, appreciate recs, continue reduced dose of Lasix , metolazone  prn, continue decreased dose of Coreg , spironolactone , Jardiance  Monitor weight and I/Os, monitor renal function and electrolytes  ??HCAP ?Small left loculated effusion Afebrile, no leukocytosis Dyspnea has improved Procalcitonin pending Repeat chest x-ray showed stable to slight interval worsening of left base opacification likely moderate effusion with associated atelectasis.  Infection in left base is possible Start IV cefepime for now given recent CABG and hospitalization  Hypokalemia Replace as needed   CAD s/p CABG  S/p CABG on 07/30/23 No anginal symptoms  Continue ASA and beta-blocker    PAF QTc prolongation EKG showed atrial flutter, QTc prolongation Continue amiodarone  Continue Eliquis , Coreg   Cardiology on board, plan for cardioversion early next week  Normocytic anemia Hemoglobin around baseline Anemia panel showed iron  34, sats 11 Gave 1 dose of IV iron  on 6/19, start oral iron  supplementation Daily CBC   Diabetes mellitus type 2 Last A1c 6.2 on 06/2023 SSI, Accu-Cheks, Semglee , hypoglycemic protocol   Hyponatremia  Daily BMP   OSA on CPAP Continue CPAP   Anxiety, insomnia  Continue ativan  and trazodone      Single non-displaced rib fracture  Pain-control, incentive spirometry  Class I obesity Lifestyle modification advised    Estimated body mass index is 33.99 kg/m as calculated from the following:   Height  as of this encounter: 6' 2 (1.88 m).   Weight as of this encounter: 120.1 kg.     Code Status: Full  Family Communication: Discussed with daughter on 08/15/2023  Disposition Plan: Status is: Inpatient Remains inpatient appropriate because: Level of care      Consultants: Cardiothoracic surgery Cardiology  Procedures: None  Antimicrobials: Cefepime  DVT prophylaxis: Eliquis    Objective: Vitals:   08/19/23 0500 08/19/23 0816 08/19/23 0839 08/19/23 1300  BP:  (!) 146/70  (!) 121/96  Pulse: 70 83 86 82  Resp: 16 16 16 20   Temp:  98.3 F (36.8 C)  98.1 F (36.7 C)  TempSrc:  Oral  Oral  SpO2: 96% 99% 100% 100%  Weight: 120.1 kg     Height:        Intake/Output Summary (Last 24 hours) at 08/19/2023 1611 Last data filed at 08/19/2023 1536 Gross per 24 hour  Intake 675.24 ml  Output 3675 ml  Net -2999.76 ml   Filed Weights   08/15/23 1316 08/19/23 0500  Weight: 123.3 kg 120.1 kg    Exam: General: NAD  Cardiovascular: S1, S2 present Respiratory: Diminished breath sounds bilaterally Abdomen: Soft, nontender, nondistended, bowel sounds present Musculoskeletal: +bilateral pedal edema noted Skin: Noted bruising on right lower extremity thigh around vein harvest sites Psychiatry: Normal mood     Data Reviewed: CBC: Recent Labs  Lab 08/15/23 0647 08/16/23 0328 08/17/23 0255 08/18/23 0401 08/19/23 0354  WBC 11.5* 9.4 7.7 7.7 8.4  NEUTROABS  --   --   --   --  6.4  HGB 11.0* 10.7* 10.5* 10.4* 11.5*  HCT 33.9* 32.1* 31.8* 32.2* 35.4*  MCV 90.9 89.7 88.1 89.0 87.6  PLT 569* 513* 500* 465* 525*   Basic Metabolic Panel: Recent Labs  Lab 08/15/23 0647 08/16/23 0328 08/17/23 0255 08/17/23 0945 08/18/23 0401 08/19/23 0354  NA 130* 130* 130*  --  130* 128*  K 3.6 3.6 3.4*  --  4.1 3.9  CL 93* 91* 88*  --  90* 86*  CO2 28 32 32  --  30 32  GLUCOSE 141* 107* 106*  --  122* 119*  BUN 10 11 11   --  13 13  CREATININE 0.75 0.80 0.71  --  0.85 0.94   CALCIUM  7.9* 8.3* 8.2*  --  8.4* 8.3*  MG 2.1  --   --  2.0  --   --    GFR: Estimated Creatinine Clearance: 97.9 mL/min (by C-G formula based on SCr of 0.94 mg/dL). Liver Function Tests: No results for input(s): AST, ALT, ALKPHOS, BILITOT, PROT, ALBUMIN  in the last 168 hours. No results for input(s): LIPASE, AMYLASE in the last 168 hours. No results for input(s): AMMONIA in the last 168 hours. Coagulation Profile: No results for input(s): INR, PROTIME in the last 168 hours. Cardiac Enzymes: No results for input(s): CKTOTAL, CKMB, CKMBINDEX, TROPONINI in the last 168 hours. BNP (last 3 results) No results for input(s): PROBNP in the last 8760 hours. HbA1C: No results for input(s): HGBA1C in the last 72 hours. CBG: Recent Labs  Lab 08/18/23 2050 08/18/23 2359 08/19/23 0324 08/19/23 0820 08/19/23 1256  GLUCAP 174* 138* 109* 101* 139*   Lipid Profile: No results for input(s): CHOL, HDL, LDLCALC, TRIG, CHOLHDL, LDLDIRECT in the last 72 hours. Thyroid Function Tests: No results for input(s): TSH, T4TOTAL,  FREET4, T3FREE, THYROIDAB in the last 72 hours. Anemia Panel: No results for input(s): VITAMINB12, FOLATE, FERRITIN, TIBC, IRON , RETICCTPCT in the last 72 hours.  Urine analysis:    Component Value Date/Time   COLORURINE YELLOW 08/08/2023 0916   APPEARANCEUR HAZY (A) 08/08/2023 0916   LABSPEC 1.014 08/08/2023 0916   PHURINE 6.0 08/08/2023 0916   GLUCOSEU NEGATIVE 08/08/2023 0916   HGBUR SMALL (A) 08/08/2023 0916   BILIRUBINUR NEGATIVE 08/08/2023 0916   KETONESUR NEGATIVE 08/08/2023 0916   PROTEINUR NEGATIVE 08/08/2023 0916   UROBILINOGEN 1.0 12/21/2012 1341   NITRITE NEGATIVE 08/08/2023 0916   LEUKOCYTESUR LARGE (A) 08/08/2023 0916   Sepsis Labs: @LABRCNTIP (procalcitonin:4,lacticidven:4)  ) No results found for this or any previous visit (from the past 240 hours).     Studies: DG Chest 2  View Result Date: 08/19/2023 CLINICAL DATA:  CABG. EXAM: CHEST - 2 VIEW COMPARISON:  08/17/2023 FINDINGS: Sternotomy wires unchanged. Lungs are adequately inflated demonstrate stable to slight interval worsening of left base opacification likely moderate effusion with associated Lexis. Infection in the left base is possible. Stable cardiomegaly. Remainder of the exam is unchanged. IMPRESSION: 1. Stable to slight interval worsening of left base opacification likely moderate effusion with associated atelectasis. Infection in the left base is possible. 2. Stable cardiomegaly. Electronically Signed   By: Toribio Agreste M.D.   On: 08/19/2023 13:47      Scheduled Meds:  ALPRAZolam   0.25 mg Oral BID   amiodarone   400 mg Oral BID   apixaban   5 mg Oral BID   aspirin  EC  81 mg Oral Daily   azelastine   1 spray Each Nare BID   carvedilol   3.125 mg Oral BID   empagliflozin   10 mg Oral Daily   feeding supplement  237 mL Oral BID BM   ferrous gluconate   324 mg Oral Q breakfast   fluticasone   2 spray Each Nare BID   [START ON 08/20/2023] furosemide   80 mg Intravenous Daily   insulin  aspart  0-6 Units Subcutaneous Q4H   insulin  glargine-yfgn  20 Units Subcutaneous Daily   olopatadine   1 drop Both Eyes Q12H   polyethylene glycol  34 g Oral QHS   sodium chloride  flush  3 mL Intravenous Q12H   spironolactone   12.5 mg Oral Daily   terazosin   2 mg Oral QHS   Tiotropium Bromide  Monohydrate  2 puff Inhalation Daily   traZODone   100 mg Oral QHS    Continuous Infusions:     LOS: 4 days     Lebron JINNY Cage, MD [facility]  If 7PM-7AM, please contact night-coverage [email or web address] 08/19/2023, 4:11 PM

## 2023-08-19 NOTE — Progress Notes (Deleted)
 Cardiology Office Note    Date:  08/19/2023  ID:  [name], DOB [birth year], MRN [id number] PCP:  [facility]  Cardiologist:  None  Electrophysiologist:  None   Chief Complaint: ***  History of Present Illness: .    [name]. is a 73 y.o. male with visit-pertinent history of ***  Labwork independently reviewed:   ROS: .   *** denies chest pain, shortness of breath, lower extremity edema, fatigue, palpitations, melena, hematuria, hemoptysis, diaphoresis, weakness, presyncope, syncope, orthopnea, and PND.  All other systems are reviewed and otherwise negative.  Studies Reviewed: SABRA    EKG:  EKG is ordered today, personally reviewed, demonstrating ***     CV Studies: Cardiac studies reviewed are outlined and summarized above. Otherwise please see EMR for full report. Cardiac Studies & Procedures   ______________________________________________________________________________________________ CARDIAC CATHETERIZATION  CARDIAC CATHETERIZATION 07/27/2023  Conclusion   Prox RCA lesion is 40% stenosed.   Dist RCA lesion is 75% stenosed.   RPDA lesion is 80% stenosed.   1st RPL lesion is 80% stenosed.   1st Mrg lesion is 90% stenosed.   1st Diag lesion is 95% stenosed.   Prox LAD to Mid LAD lesion is 90% stenosed.   Mid LAD to Dist LAD lesion is 90% stenosed.   Dist LAD lesion is 90% stenosed.   LV end diastolic pressure is normal.  1.  Patent left main with no significant stenosis 2.  Severe diffuse LAD stenosis with predominant disease in the mid and distal vessel 3.  Severe first diagonal stenosis 4.  Severe diffuse first OM stenosis 5.  Severe RCA and PDA/PLA stenoses 6.  Normal LVEDP 7.  Right heart catheterization data: Preserved cardiac output and index of 5.6 and 2.6, respectively RA mean 7 mmHg RV 39/10 mmHg PA 36/13 mean 24 mmHg Pulmonary capillary wedge pressure mean 15 mmHg  Recommendations: Resume IV heparin  post  catheterization.  Cardiac surgical consultation for consideration of CABG.  Targets are marginal and if turndown for surgery we will treat with aggressive medical therapy.  Not a candidate for PCI due to severe multivessel diffuse disease.  Findings Coronary Findings Diagnostic  Dominance: Right  Left Main There is mild diffuse disease throughout the vessel.  Left Anterior Descending Prox LAD to Mid LAD lesion is 90% stenosed. Mid LAD to Dist LAD lesion is 90% stenosed. Dist LAD lesion is 90% stenosed.  First Diagonal Branch 1st Diag lesion is 95% stenosed.  Left Circumflex  First Obtuse Marginal Branch 1st Mrg lesion is 90% stenosed.  Right Coronary Artery Prox RCA lesion is 40% stenosed. Dist RCA lesion is 75% stenosed.  Right Posterior Descending Artery RPDA lesion is 80% stenosed.  First Right Posterolateral Branch 1st RPL lesion is 80% stenosed.  Intervention  No interventions have been documented.   STRESS TESTS  NM MYOCAR MULTI W/SPECT W 07/28/2008  Narrative Exercise Stress Myovue:  Indication: Chest Pain  Protocol:  The patient exercised 6 minutes on a standard Bruce protocol. Exercise was stopped due to fatigue and SOB.  Resting ECG showed borderline LVH with insifnificatn Q's in inferior leads. With stress there was no ischemia.  Maximum HR was 142 bpm.  Findings:  Images were reconstructed in the verticle, horizontal and short axis.  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  There was no ischemia.  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 Date of Exam: 08/15/2023 Medical Rec #:  [id number]           Height:       74.0 in Accession #:    [id number]          Weight:       271.9  lb Date of Birth:  04-Apr-1950            BSA:          2.477 m Patient Age:    72 years            BP:           122/68 mmHg Patient Gender: M                   HR:           85 bpm. Exam Location:  Inpatient  Procedure: 2D Echo, Color Doppler and Cardiac Doppler (Both Spectral and Color Flow Doppler were utilized during procedure).  Indications:    Dyspnea  History:        Patient has prior history of Echocardiogram examinations, most recent 07/25/2023. Risk Factors:Diabetes and Hypertension.  Sonographer:    Benard Stallion Referring Phys: [id number] MYRON G RODDENBERRY  IMPRESSIONS   1. Left ventricular ejection fraction, by estimation, is 50 to 55%. The left ventricle has low normal function. The left ventricle demonstrates regional wall motion abnormalities (see scoring diagram/findings for description). There is mild left ventricular hypertrophy. Left ventricular diastolic parameters are indeterminate. Elevated left ventricular end-diastolic pressure. There is moderate hypokinesis of the left ventricular, mid-apical septal wall and apical segment. 2. Right ventricular systolic function was not well visualized. The right ventricular size is not well visualized. 3. Left atrial size was mildly dilated. 4. The mitral valve is degenerative. Trivial mitral valve regurgitation. No evidence of mitral stenosis. The mean mitral valve gradient is 4.0 mmHg. Severe mitral annular calcification. 5. The aortic valve was not well visualized. Aortic valve regurgitation is not visualized. Aortic valve sclerosis/calcification is present, without any evidence of aortic stenosis. Aortic valve mean gradient measures 7.0 mmHg. 6. Rhythm strip during this exam demonstrates atrial flutter.  Comparison(s): Changes from prior study are noted. 07/25/2023: LVEF 55-60%.  FINDINGS Left Ventricle: Left ventricular ejection fraction, by estimation, is 50 to 55%. The left ventricle has low normal function. The left  ventricle demonstrates regional wall motion abnormalities. Moderate hypokinesis of the left ventricular, mid-apical septal wall and apical segment. There is mild left ventricular hypertrophy. Left ventricular diastolic function could not be evaluated due to atrial flutter. Left ventricular diastolic parameters are indeterminate. Elevated left ventricular end-diastolic pressure.   LV Wall Scoring: The apical septal segment, apical anterior segment, and apex are hypokinetic.  Right Ventricle: The right ventricular size is not well visualized. Right vetricular wall thickness was not well visualized. Right ventricular systolic function was not well visualized.  Left Atrium: Left atrial size was mildly dilated.  Right Atrium: Right atrial size was normal in size.  Pericardium: There is no evidence of pericardial effusion.  Mitral Valve: The mitral valve is degenerative in appearance. Severe mitral annular calcification. Trivial mitral valve regurgitation. No evidence of mitral valve stenosis. MV peak gradient, 12.8 mmHg. The mean mitral valve gradient is 4.0 mmHg.  Tricuspid Valve: The  tricuspid valve is grossly normal. Tricuspid valve regurgitation is trivial.  Aortic Valve: The aortic valve was not well visualized. Aortic valve regurgitation is not visualized. Aortic valve sclerosis/calcification is present, without any evidence of aortic stenosis. Aortic valve mean gradient measures 7.0 mmHg. Aortic valve peak gradient measures 13.1 mmHg. Aortic valve area, by VTI measures 2.42 cm.  Pulmonic Valve: The pulmonic valve was normal in structure. Pulmonic valve regurgitation is not visualized.  Aorta: The aortic root and ascending aorta are structurally normal, with no evidence of dilitation.  IAS/Shunts: No atrial level shunt detected by color flow Doppler.  EKG: Rhythm strip during this exam demonstrates atrial flutter.   LEFT VENTRICLE PLAX 2D LVIDd:         4.60 cm       Diastology LVIDs:         2.90 cm      LV e' medial:    4.68 cm/s LV PW:         1.20 cm      LV E/e' medial:  38.2 LV IVS:        1.30 cm      LV e' lateral:   6.64 cm/s LVOT diam:     2.30 cm      LV E/e' lateral: 27.0 LV SV:         84 LV SV Index:   34 LVOT Area:     4.15 cm  LV Volumes (MOD) LV vol d, MOD A2C: 102.0 ml LV vol d, MOD A4C: 87.1 ml LV vol s, MOD A2C: 48.0 ml LV vol s, MOD A4C: 36.8 ml LV SV MOD A2C:     54.0 ml LV SV MOD A4C:     87.1 ml LV SV MOD BP:      52.3 ml  RIGHT VENTRICLE RV S prime:     6.53 cm/s TAPSE (M-mode): 2.0 cm  LEFT ATRIUM             Index        RIGHT ATRIUM           Index LA Vol (A2C):   99.6 ml 40.22 ml/m  RA Area:     22.50 cm LA Vol (A4C):   79.6 ml 32.14 ml/m  RA Volume:   63.20 ml  25.52 ml/m LA Biplane Vol: 90.3 ml 36.46 ml/m AORTIC VALVE AV Area (Vmax):    2.52 cm AV Area (Vmean):   2.30 cm AV Area (VTI):     2.42 cm AV Vmax:           181.00 cm/s AV Vmean:          122.000 cm/s AV VTI:            0.349 m AV Peak Grad:      13.1 mmHg AV Mean Grad:      7.0 mmHg LVOT Vmax:         110.00 cm/s LVOT Vmean:        67.500 cm/s LVOT VTI:          0.203 m LVOT/AV VTI ratio: 0.58  MITRAL VALVE                TRICUSPID VALVE MV Area (PHT): 3.99 cm     TR Peak grad:   21.0 mmHg MV Area VTI:   1.68 cm     TR Vmax:        229.00 cm/s MV Peak grad:  12.8 mmHg MV Mean  grad:  4.0 mmHg     SHUNTS MV Vmax:       1.79 m/s     Systemic VTI:  0.20 m MV Vmean:      86.3 cm/s    Systemic Diam: 2.30 cm MV Decel Time: 190 msec MV E velocity: 179.00 cm/s MV A velocity: 88.60 cm/s MV E/A ratio:  2.02  Vinie Maxcy MD Electronically signed by Vinie Maxcy MD Signature Date/Time: 08/15/2023/5:22:44 PM    Final   TEE  ECHO INTRAOPERATIVE TEE 07/30/2023  Narrative *INTRAOPERATIVE TRANSESOPHAGEAL REPORT *    Patient Name:   [name]. Date of Exam: 07/30/2023 Medical Rec #:  [id number]           Height:       74.0  in Accession #:    [id number]          Weight:       263.2 lb Date of Birth:  [birth year]            BSA:          2.44 m Patient Age:    72 years            BP:           147/58 mmHg Patient Gender: M                   HR:           61 bpm. Exam Location:  Anesthesiology  Transesophogeal exam was perform intraoperatively during surgical procedure. Patient was closely monitored under general anesthesia during the entirety of examination.  Indications:     CAD Performing Phys: [id number] STEVEN C HENDRICKSON  Complications: No known complications during this procedure. POST-OP IMPRESSIONS _ Left Ventricle: The left ventricle is unchanged from pre-bypass. has normal systolic function. The cavity size was normal. _ Right Ventricle: The right ventricle appears unchanged from pre-bypass The cavity was normal. _ Aorta: The aorta appears unchanged from pre-bypass. _ Left Atrial Appendage: Has been surgically closed and the repair appears to be oversewn. _ Aortic Valve: The aortic valve appears unchanged from pre-bypass. There is no regurgitation. No regurgitation post repair. The gradient recorded across the prosthetic valve is within the expected range. _ Mitral Valve: The mitral valve appears unchanged from pre-bypass. There is no regurgitation. No regurgitation post repair. The gradient recorded across the prosthetic valve is within the expected range. _ Tricuspid Valve: The tricuspid valve appears unchanged from pre-bypass. There is no regurgitation. No regurgitation post repair. The gradient recorded across the prosthetic valve is within the expected range. _ Pulmonic Valve: The pulmonic valve appears unchanged from pre-bypass. _ Interatrial Septum: The interatrial septum appears unchanged from pre-bypass.  PRE-OP FINDINGS Left Ventricle: The left ventricle has normal systolic function, with an ejection fraction of 55-60%. The cavity size was normal. There is moderately increased left  ventricular wall thickness. There is moderate concentric left ventricular hypertrophy.   Right Ventricle: The right ventricle has normal systolic function. The cavity was normal. There is increased right ventricular wall thickness.  Left Atrium: Left atrial size was normal in size. No left atrial/left atrial appendage thrombus was detected.  Right Atrium: Right atrial size was normal in size.  Interatrial Septum: No atrial level shunt detected by color flow Doppler.  Pericardium: A small pericardial effusion is present.  Mitral Valve: The mitral valve is normal in structure. Mitral valve regurgitation is mild by color flow Doppler. There is Mild mitral stenosis.  There is severe thickening and severe calcifcation present on the mitral valve posterior cusp with severely decreased mobility and there is mild thickening and mild calcification present on the mitral valve anterior cusp with normal mobility.  Tricuspid Valve: The tricuspid valve was normal in structure. Tricuspid valve regurgitation is mild by color flow Doppler. There is no evidence of tricuspid valve vegetation.  Aortic Valve: The aortic valve is tricuspid Aortic valve regurgitation is trivial by color flow Doppler. There is mild stenosis of the aortic valve. There is no evidence of aortic valve vegetation. There is severe thickening and severe calcifcation present on the aortic valve left coronary cusp with severely decreased mobility.  Pulmonic Valve: The pulmonic valve was normal in structure, with normal. The gradient recorded across the pulmonic valve is within the expected range. Pulmonic valve regurgitation is trivial by color flow Doppler.   Aorta: There is evidence of plaque in the descending aorta; Grade I, measuring 1-63mm in size.  Venous: The inferior vena cava is normal in size with greater than 50% respiratory variability, suggesting right atrial pressure of 3 mmHg.  +-------------+------------++ AORTIC VALVE               +-------------+------------++ AV Vmax:     184.00 cm/s  +-------------+------------++ AV Vmean:    125.000 cm/s +-------------+------------++ AV VTI:      0.492 m      +-------------+------------++ AV Peak Grad:13.5 mmHg    +-------------+------------++ AV Mean Grad:13.0 mmHg    +-------------+------------++  +-------------+---------++ MITRAL VALVE           +-------------+---------++ MV Peak grad:6.4 mmHg  +-------------+---------++ MV Mean grad:3.0 mmHg  +-------------+---------++ MV Vmax:     1.26 m/s  +-------------+---------++ MV Vmean:    71.3 cm/s +-------------+---------++ MV VTI:      0.42 m    +-------------+---------++   Norleen Pope MD Electronically signed by Norleen Pope MD Signature Date/Time: 08/02/2023/4:59:25 PM    Final        ______________________________________________________________________________________________       Current Reported Medications:.    No outpatient medications have been marked as taking for the 08/20/23 encounter (Appointment) with Ariona Deschene D, NP.    Physical Exam:    VS:  There were no vitals taken for this visit.   Wt Readings from Last 3 Encounters:  08/19/23 264 lb 12.4 oz (120.1 kg)  08/10/23 270 lb 15.1 oz (122.9 kg)  09/08/20 250 lb (113.4 kg)    GEN: Well nourished, well developed in no acute distress NECK: No JVD; No carotid bruits CARDIAC: ***RRR, no murmurs, rubs, gallops RESPIRATORY:  Clear to auscultation without rales, wheezing or rhonchi  ABDOMEN: Soft, non-tender, non-distended EXTREMITIES:  No edema; No acute deformity     Asessement and Plan:.     ***  {The patient has an active order for outpatient cardiac rehabilitation.   Please indicate if the patient is ready to start. Do NOT delete this.  It will auto delete.  Refresh note, then sign.              Click here to document readiness and see contraindications.   :1}  Cardiac Rehabilitation Eligibility Assessment      Disposition: F/u with ***  Signed, Vallery Mcdade D Aaralynn Shepheard, NP

## 2023-08-19 NOTE — Progress Notes (Signed)
 DAILY PROGRESS NOTE   Patient Name: [name]. Date of Encounter: 08/19/2023 Cardiologist: None  Chief Complaint   Breathing has improved  Patient Profile   [name]. is a 73 y.o. male with a hx of PAF on eliquis , HFpEF, alcohol use, cannabis use, hx of tobacco use, HLD with statin intolerance, COPD, DM2, CKD2, OSA on CPAP, obesity, remote hx of ICH, provoked DVT who is being seen 08/17/2023 for the evaluation of CHF medication adjustments at the request of Dr. Donnamarie.   Subjective   Negative another 2.8L overnight- now 9.1L negative. Remains in atrial flutter- rate controlled on IV amiodarone .  Objective   Vitals:   08/19/23 0326 08/19/23 0500 08/19/23 0816 08/19/23 0839  BP: 134/71  (!) 146/70   Pulse: 81 70 83 86  Resp: 18 16 16 16   Temp: 98.5 F (36.9 C)  98.3 F (36.8 C)   TempSrc: Oral  Oral   SpO2: 99% 96% 99% 100%  Weight:  120.1 kg    Height:        Intake/Output Summary (Last 24 hours) at 08/19/2023 0916 Last data filed at 08/19/2023 9182 Gross per 24 hour  Intake 435.24 ml  Output 3675 ml  Net -3239.76 ml   Filed Weights   08/15/23 1316 08/19/23 0500  Weight: 123.3 kg 120.1 kg    Physical Exam   General appearance: alert, no distress, moderately obese, and pale Neck: JVD - 4 cm above sternal notch, no carotid bruit, and thyroid not enlarged, symmetric, no tenderness/mass/nodules Lungs: diminished breath sounds bibasilar Heart: irregularly irregular rhythm Extremities: edema trace sockline Neurologic: Grossly normal   Inpatient Medications    Scheduled Meds:  ALPRAZolam   0.25 mg Oral BID   amiodarone   400 mg Oral BID   apixaban   5 mg Oral BID   aspirin  EC  81 mg Oral Daily   azelastine   1 spray Each Nare BID   carvedilol   3.125 mg Oral BID   empagliflozin   10 mg Oral Daily   feeding supplement  237 mL Oral BID BM   ferrous gluconate   324 mg Oral Q breakfast   fluticasone   2 spray Each Nare BID   furosemide   80 mg  Intravenous BID   insulin  aspart  0-6 Units Subcutaneous Q4H   insulin  glargine-yfgn  20 Units Subcutaneous Daily   olopatadine   1 drop Both Eyes Q12H   polyethylene glycol  34 g Oral QHS   sodium chloride  flush  3 mL Intravenous Q12H   spironolactone   12.5 mg Oral Daily   terazosin   2 mg Oral QHS   Tiotropium Bromide  Monohydrate  2 puff Inhalation Daily   traZODone   100 mg Oral QHS    Continuous Infusions:  amiodarone  30 mg/hr (08/19/23 0645)    PRN Meds: acetaminophen  **OR** acetaminophen , hydrOXYzine , senna-docusate, traMADol , trimethobenzamide   Labs   Results for orders placed or performed during the hospital encounter of 08/14/23 (from the past 48 hours)  Magnesium      Status: None   Collection Time: 08/17/23  9:45 AM  Result Value Ref Range   Magnesium  2.0 1.7 - 2.4 mg/dL    Comment: Performed at [facility], [address], [place], [place] [address]  Glucose, capillary     Status: Abnormal   Collection Time: 08/17/23  9:54 AM  Result Value Ref Range   Glucose-Capillary 161 (H) 70 - 99 mg/dL    Comment: Glucose reference range applies only to samples  taken after fasting for at least 8 hours.  Glucose, capillary     Status: Abnormal   Collection Time: 08/17/23 12:05 PM  Result Value Ref Range   Glucose-Capillary 134 (H) 70 - 99 mg/dL    Comment: Glucose reference range applies only to samples taken after fasting for at least 8 hours.  Glucose, capillary     Status: Abnormal   Collection Time: 08/17/23  4:54 PM  Result Value Ref Range   Glucose-Capillary 160 (H) 70 - 99 mg/dL    Comment: Glucose reference range applies only to samples taken after fasting for at least 8 hours.  Glucose, capillary     Status: Abnormal   Collection Time: 08/17/23  8:44 PM  Result Value Ref Range   Glucose-Capillary 181 (H) 70 - 99 mg/dL    Comment: Glucose reference range applies only to samples taken after fasting for at least 8 hours.  Glucose, capillary     Status: Abnormal    Collection Time: 08/17/23 11:57 PM  Result Value Ref Range   Glucose-Capillary 147 (H) 70 - 99 mg/dL    Comment: Glucose reference range applies only to samples taken after fasting for at least 8 hours.   Comment 1 Notify RN    Comment 2 Document in Chart   Basic metabolic panel     Status: Abnormal   Collection Time: 08/18/23  4:01 AM  Result Value Ref Range   Sodium 130 (L) 135 - 145 mmol/L   Potassium 4.1 3.5 - 5.1 mmol/L   Chloride 90 (L) 98 - 111 mmol/L   CO2 30 22 - 32 mmol/L   Glucose, Bld 122 (H) 70 - 99 mg/dL    Comment: Glucose reference range applies only to samples taken after fasting for at least 8 hours.   BUN 13 8 - 23 mg/dL   Creatinine, Ser 9.14 0.61 - 1.24 mg/dL   Calcium  8.4 (L) 8.9 - 10.3 mg/dL   GFR, Estimated >39 >39 mL/min    Comment: (NOTE) Calculated using the CKD-EPI Creatinine Equation (2021)    Anion gap 10 5 - 15    Comment: Performed at [facility], [address] [place], [place] [address]  CBC     Status: Abnormal   Collection Time: 08/18/23  4:01 AM  Result Value Ref Range   WBC 7.7 4.0 - 10.5 K/uL   RBC 3.62 (L) 4.22 - 5.81 MIL/uL   Hemoglobin 10.4 (L) 13.0 - 17.0 g/dL   HCT 67.7 (L) 60.9 - 47.9 %   MCV 89.0 80.0 - 100.0 fL   MCH 28.7 26.0 - 34.0 pg   MCHC 32.3 30.0 - 36.0 g/dL   RDW 85.8 88.4 - 84.4 %   Platelets 465 (H) 150 - 400 K/uL   nRBC 0.0 0.0 - 0.2 %    Comment: Performed at [facility], [address], [place], [place] [address]  Glucose, capillary     Status: Abnormal   Collection Time: 08/18/23  4:46 AM  Result Value Ref Range   Glucose-Capillary 136 (H) 70 - 99 mg/dL    Comment: Glucose reference range applies only to samples taken after fasting for at least 8 hours.   Comment 1 Notify RN    Comment 2 Document in Chart   Glucose, capillary     Status: Abnormal   Collection Time: 08/18/23  9:15 AM  Result Value Ref Range   Glucose-Capillary 166 (H) 70 - 99 mg/dL  Comment: Glucose reference range applies  only to samples taken after fasting for at least 8 hours.   Comment 1 Notify RN    Comment 2 Document in Chart   Glucose, capillary     Status: Abnormal   Collection Time: 08/18/23 12:15 PM  Result Value Ref Range   Glucose-Capillary 137 (H) 70 - 99 mg/dL    Comment: Glucose reference range applies only to samples taken after fasting for at least 8 hours.   Comment 1 Notify RN    Comment 2 Document in Chart   Glucose, capillary     Status: Abnormal   Collection Time: 08/18/23  5:20 PM  Result Value Ref Range   Glucose-Capillary 183 (H) 70 - 99 mg/dL    Comment: Glucose reference range applies only to samples taken after fasting for at least 8 hours.  Glucose, capillary     Status: Abnormal   Collection Time: 08/18/23  8:50 PM  Result Value Ref Range   Glucose-Capillary 174 (H) 70 - 99 mg/dL    Comment: Glucose reference range applies only to samples taken after fasting for at least 8 hours.   Comment 1 Notify RN    Comment 2 Document in Chart   Glucose, capillary     Status: Abnormal   Collection Time: 08/18/23 11:59 PM  Result Value Ref Range   Glucose-Capillary 138 (H) 70 - 99 mg/dL    Comment: Glucose reference range applies only to samples taken after fasting for at least 8 hours.   Comment 1 Notify RN    Comment 2 Document in Chart   Glucose, capillary     Status: Abnormal   Collection Time: 08/19/23  3:24 AM  Result Value Ref Range   Glucose-Capillary 109 (H) 70 - 99 mg/dL    Comment: Glucose reference range applies only to samples taken after fasting for at least 8 hours.   Comment 1 Notify RN    Comment 2 Document in Chart   Basic metabolic panel with GFR     Status: Abnormal   Collection Time: 08/19/23  3:54 AM  Result Value Ref Range   Sodium 128 (L) 135 - 145 mmol/L   Potassium 3.9 3.5 - 5.1 mmol/L   Chloride 86 (L) 98 - 111 mmol/L   CO2 32 22 - 32 mmol/L   Glucose, Bld 119 (H) 70 - 99 mg/dL    Comment: Glucose reference range applies only to samples taken after  fasting for at least 8 hours.   BUN 13 8 - 23 mg/dL   Creatinine, Ser 9.05 0.61 - 1.24 mg/dL   Calcium  8.3 (L) 8.9 - 10.3 mg/dL   GFR, Estimated >39 >39 mL/min    Comment: (NOTE) Calculated using the CKD-EPI Creatinine Equation (2021)    Anion gap 10 5 - 15    Comment: Performed at [facility], [address], [place], [place] [address]  CBC with Differential/Platelet     Status: Abnormal   Collection Time: 08/19/23  3:54 AM  Result Value Ref Range   WBC 8.4 4.0 - 10.5 K/uL   RBC 4.04 (L) 4.22 - 5.81 MIL/uL   Hemoglobin 11.5 (L) 13.0 - 17.0 g/dL   HCT 64.5 (L) 60.9 - 47.9 %   MCV 87.6 80.0 - 100.0 fL   MCH 28.5 26.0 - 34.0 pg   MCHC 32.5 30.0 - 36.0 g/dL   RDW 85.9 88.4 - 84.4 %   Platelets 525 (H) 150 - 400 K/uL  nRBC 0.0 0.0 - 0.2 %   Neutrophils Relative % 74 %   Neutro Abs 6.4 1.7 - 7.7 K/uL   Lymphocytes Relative 13 %   Lymphs Abs 1.1 0.7 - 4.0 K/uL   Monocytes Relative 9 %   Monocytes Absolute 0.8 0.1 - 1.0 K/uL   Eosinophils Relative 2 %   Eosinophils Absolute 0.1 0.0 - 0.5 K/uL   Basophils Relative 1 %   Basophils Absolute 0.1 0.0 - 0.1 K/uL   Immature Granulocytes 1 %   Abs Immature Granulocytes 0.05 0.00 - 0.07 K/uL    Comment: Performed at [facility], [address] [place], [place] [address]  Glucose, capillary     Status: Abnormal   Collection Time: 08/19/23  8:20 AM  Result Value Ref Range   Glucose-Capillary 101 (H) 70 - 99 mg/dL    Comment: Glucose reference range applies only to samples taken after fasting for at least 8 hours.   Comment 1 Notify RN    Comment 2 Document in Chart     ECG   N/A  Telemetry   Rate controlled atrial flutter - Personally Reviewed  Radiology    DG Chest 2 View Result Date: 08/17/2023 CLINICAL DATA:  Dyspnea and diastolic heart failure EXAM: CHEST - 2 VIEW COMPARISON:  08/14/2023 FINDINGS: Median sternotomy for CABG.  Valve repair. Midline trachea. Cardiomegaly accentuated by AP portable technique.  Small bilateral pleural effusions. No pneumothorax. Mild interstitial edema is not significantly changed. Left greater than right base airspace disease is similar given differences in technique. IMPRESSION: 1. Cardiomegaly with similar mild congestive heart failure. 2. Similar bibasilar airspace disease, favoring atelectasis. Electronically Signed   By: Rockey Kilts M.D.   On: 08/17/2023 13:22    Cardiac Studies   N/A  Assessment   Principal Problem:   Acute on chronic diastolic CHF (congestive heart failure) (HCC) Active Problems:   Essential hypertension, benign   Type 2 diabetes mellitus with hyperglycemia, with long-term current use of insulin  (HCC)   Sleep apnea   Hyponatremia   COPD (chronic obstructive pulmonary disease) (HCC)   Coronary artery disease involving native coronary artery of native heart without angina pectoris   Paroxysmal atrial fibrillation (HCC)   Generalized anxiety disorder   Plan   Mr. Luviano has had good urine output overnight- overall 10L negative. He remains in atrial flutter.  Creatinine stable, although sodium lower today at 128. Decrease lasix  to 80 mg IV Daily.  Will switch amiodarone  from IV to 400 mg PO BID today - continue x 7 days, then reduce to 200 mg BID.  Plan is for likely TEE/DCCV next week - will try to schedule tomorrow, but unsure if there is availability.  Informed Consent   Shared Decision Making/Informed Consent   The risks [stroke, cardiac arrhythmias rarely resulting in the need for a temporary or permanent pacemaker, skin irritation or burns, esophageal damage, perforation (1:10,000 risk), bleeding, pharyngeal hematoma as well as other potential complications associated with conscious sedation including aspiration, arrhythmia, respiratory failure and death], benefits (treatment guidance, restoration of normal sinus rhythm, diagnostic support) and alternatives of a transesophageal echocardiogram guided cardioversion were discussed in  detail with Mr. Noda and he is willing to proceed.     Time Spent Directly with Patient:  I have spent a total of 25 minutes with the patient reviewing hospital notes, telemetry, EKGs, labs and examining the patient as well as establishing an assessment and plan that was discussed personally with the  patient.  > 50% of time was spent in direct patient care.  Length of Stay:  LOS: 4 days   Vinie KYM Maxcy, MD, [facility], FNLA, FACP  [place]  [facility]  Medical Director of the [facility]  Card[facility]ovascular [facility] Clinic Diplomate of the American Board of Clinical Lipidology Attending Cardiologist  Direct Dial: [phone]  Fax: [phone]  Website:  [email or web address] Vinie JAYSON Maxcy 08/19/2023, 9:16 AM

## 2023-08-19 NOTE — Progress Notes (Addendum)
                  [address]           [place], [place] [address]                     [phone]            Subjective: Patient just waking up this am. He states I have been peeing a lot. He has no specific complaint this am.  Objective: Vital signs in last 24 hours: Temp:  [97.9 F (36.6 C)-99.3 F (37.4 C)] 98.5 F (36.9 C) (06/22 0326) Pulse Rate:  [70-86] 70 (06/22 0500) Cardiac Rhythm: Atrial flutter;Bundle branch block (06/21 1900) Resp:  [16-20] 16 (06/22 0500) BP: (116-137)/(60-71) 134/71 (06/22 0326) SpO2:  [93 %-99 %] 96 % (06/22 0500) Weight:  [120.1 kg] 120.1 kg (06/22 0500)   Current Weight  08/19/23 120.1 kg  Weight day of discharge (after surgery) was 122.9 kg    Intake/Output from previous day: 06/21 0701 - 06/22 0700 In: 435.2 [I.V.:435.2] Out: 3325 [Urine:3325]   Physical Exam:  Cardiovascular: IRRR Pulmonary: Clear but shallow breath sounds bilaterally  Abdomen: Soft, non tender, bowel sounds present. Extremities: Trace bilateral lower extremity edema. Ecchymosis right thigh Wounds: Clean and dry.  No erythema or signs of infection.  Lab Results: CBC: Recent Labs    08/18/23 0401 08/19/23 0354  WBC 7.7 8.4  HGB 10.4* 11.5*  HCT 32.2* 35.4*  PLT 465* 525*   BMET:  Recent Labs    08/18/23 0401 08/19/23 0354  NA 130* 128*  K 4.1 3.9  CL 90* 86*  CO2 30 32  GLUCOSE 122* 119*  BUN 13 13  CREATININE 0.85 0.94  CALCIUM  8.4* 8.3*    PT/INR:  Lab Results  Component Value Date   INR 1.4 (H) 07/30/2023   INR 0.9 07/27/2023   INR 0.98 01/09/2018   ABG:  INR: Will add last result for INR, ABG once components are confirmed Will add last 4 CBG results once components are confirmed  Assessment/Plan:  1. 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 He has been in a flutter with CVR. On Amiodarone  drip, Coreg  3.125 mg bid and Apixaban  5 mg bid. With rate  controlled, transition to oral Amiodarone  today. Per Dr. Nelle note , could consider DCCV but if done prior to Thursday, will need TEE. 2.  Pulmonary - History of OSA;on CPAP. On 3 liters of oxygen  via [place]. Wean as able. On Duo nebs and Tiotropium bromide  inhaler 3. Acute on chronic diastolic heart failure- Previously, on Lasix  40 mg IV bid and now on Lasix  80 mg IV bid and Spironolactone  12.5 mg daily. Appreciate Cardiology's assistance 4.  Expected post op acute blood loss anemia -H and H this am increased to 11.5 and 35.4. Continue Fergon. 5. DM-CBGs 174/138/109. On Insulin  and Jardiance  10 mg daily. Pre op HGA1C 6.2 6. Persistent hyponatremia-sodium this am 128 7. Deconditioned-continue with PT 8. Disposition-once diuresed more, ambulation improves, +/- DDCV, will consider discharge  Donielle M ZimmermanPA-C 7:52 AM Patient seen and examined, agree with above Looks better Continue diuresis  Elspeth C. Kerrin, MD [facility] [phone]

## 2023-08-19 NOTE — Plan of Care (Signed)

## 2023-08-20 ENCOUNTER — Inpatient Hospital Stay (HOSPITAL_COMMUNITY)

## 2023-08-20 ENCOUNTER — Encounter (HOSPITAL_COMMUNITY): Payer: Self-pay | Admitting: Family Medicine

## 2023-08-20 ENCOUNTER — Ambulatory Visit: Admitting: Cardiology

## 2023-08-20 ENCOUNTER — Encounter (HOSPITAL_COMMUNITY)
Admission: EM | Disposition: A | Discharge status: Skilled Nursing Facility | Payer: Self-pay | Source: Skilled Nursing Facility | Attending: Internal Medicine

## 2023-08-20 DIAGNOSIS — I340 Nonrheumatic mitral (valve) insufficiency: Secondary | ICD-10-CM | POA: Diagnosis not present

## 2023-08-20 DIAGNOSIS — I4892 Unspecified atrial flutter: Secondary | ICD-10-CM | POA: Diagnosis not present

## 2023-08-20 DIAGNOSIS — I2510 Atherosclerotic heart disease of native coronary artery without angina pectoris: Secondary | ICD-10-CM

## 2023-08-20 DIAGNOSIS — I110 Hypertensive heart disease with heart failure: Secondary | ICD-10-CM

## 2023-08-20 DIAGNOSIS — I483 Typical atrial flutter: Secondary | ICD-10-CM | POA: Diagnosis not present

## 2023-08-20 DIAGNOSIS — I5033 Acute on chronic diastolic (congestive) heart failure: Secondary | ICD-10-CM

## 2023-08-20 HISTORY — PX: CARDIOVERSION: EP1203

## 2023-08-20 HISTORY — PX: TRANSESOPHAGEAL ECHOCARDIOGRAM (CATH LAB): EP1270

## 2023-08-20 LAB — BASIC METABOLIC PANEL WITH GFR
Anion gap: 8 (ref 5–15)
BUN: 14 mg/dL (ref 8–23)
CO2: 33 mmol/L — ABNORMAL HIGH (ref 22–32)
Calcium: 8.4 mg/dL — ABNORMAL LOW (ref 8.9–10.3)
Chloride: 89 mmol/L — ABNORMAL LOW (ref 98–111)
Creatinine, Ser: 0.84 mg/dL (ref 0.61–1.24)
GFR, Estimated: >60 mL/min
Glucose, Bld: 108 mg/dL — ABNORMAL HIGH (ref 70–99)
Potassium: 3.7 mmol/L (ref 3.5–5.1)
Sodium: 130 mmol/L — ABNORMAL LOW (ref 135–145)

## 2023-08-20 LAB — GLUCOSE, CAPILLARY
Glucose-Capillary: 105 mg/dL — ABNORMAL HIGH (ref 70–99)
Glucose-Capillary: 116 mg/dL — ABNORMAL HIGH (ref 70–99)
Glucose-Capillary: 121 mg/dL — ABNORMAL HIGH (ref 70–99)
Glucose-Capillary: 136 mg/dL — ABNORMAL HIGH (ref 70–99)
Glucose-Capillary: 156 mg/dL — ABNORMAL HIGH (ref 70–99)
Glucose-Capillary: 202 mg/dL — ABNORMAL HIGH (ref 70–99)
Glucose-Capillary: 99 mg/dL (ref 70–99)

## 2023-08-20 LAB — CBC
HCT: 34.8 % — ABNORMAL LOW (ref 39.0–52.0)
Hemoglobin: 11.1 g/dL — ABNORMAL LOW (ref 13.0–17.0)
MCH: 28.5 pg (ref 26.0–34.0)
MCHC: 31.9 g/dL (ref 30.0–36.0)
MCV: 89.2 fL (ref 80.0–100.0)
Platelets: 465 10*3/uL — ABNORMAL HIGH (ref 150–400)
RBC: 3.9 MIL/uL — ABNORMAL LOW (ref 4.22–5.81)
RDW: 13.9 % (ref 11.5–15.5)
WBC: 7.9 10*3/uL (ref 4.0–10.5)
nRBC: 0 % (ref 0.0–0.2)

## 2023-08-20 LAB — PROCALCITONIN: Procalcitonin: 0.15 ng/mL

## 2023-08-20 LAB — ECHO TEE

## 2023-08-20 SURGERY — TRANSESOPHAGEAL ECHOCARDIOGRAM (TEE) (CATHLAB)
Anesthesia: Monitor Anesthesia Care

## 2023-08-20 MED ORDER — SODIUM CHLORIDE 0.9 % IV SOLN
2.0000 g | Freq: Three times a day (TID) | INTRAVENOUS | Status: DC
  Administered 2023-08-20 – 2023-08-22 (×7): 2 g via INTRAVENOUS
  Filled 2023-08-20 (×7): qty 12.5

## 2023-08-20 MED ORDER — POTASSIUM CHLORIDE CRYS ER 20 MEQ PO TBCR: 40.0000 meq | EXTENDED_RELEASE_TABLET | Freq: Once | ORAL | Status: DC

## 2023-08-20 MED ORDER — PROPOFOL 10 MG/ML IV BOLUS
INTRAVENOUS | Status: DC | PRN
  Administered 2023-08-20: 60 mg via INTRAVENOUS

## 2023-08-20 MED ORDER — FUROSEMIDE 10 MG/ML IJ SOLN
80.0000 mg | Freq: Two times a day (BID) | INTRAMUSCULAR | Status: DC
  Administered 2023-08-20: 80 mg via INTRAVENOUS

## 2023-08-20 MED ORDER — PROPOFOL 500 MG/50ML IV EMUL
INTRAVENOUS | Status: DC | PRN
  Administered 2023-08-20: 100 ug/kg/min via INTRAVENOUS

## 2023-08-20 MED ORDER — ACETAZOLAMIDE 250 MG PO TABS
500.0000 mg | ORAL_TABLET | Freq: Two times a day (BID) | ORAL | Status: AC
  Administered 2023-08-20: 500 mg via ORAL
  Filled 2023-08-20 (×2): qty 2

## 2023-08-20 SURGICAL SUPPLY — 1 items: PAD DEFIB RADIO PHYSIO CONN (PAD) ×1 IMPLANT

## 2023-08-20 NOTE — Anesthesia Preprocedure Evaluation (Addendum)
 Anesthesia Evaluation  Patient identified by MRN, date of birth, ID band Patient awake    Reviewed: Allergy & Precautions, NPO status , Patient's Chart, lab work & pertinent test results, reviewed documented beta blocker date and time   Airway Mallampati: II  TM Distance: >3 FB Neck ROM: Full    Dental no notable dental hx. (+) Teeth Intact, Dental Advisory Given   Pulmonary sleep apnea , COPD, Patient abstained from smoking., former smoker   Pulmonary exam normal breath sounds clear to auscultation       Cardiovascular hypertension, Pt. on home beta blockers and Pt. on medications + CAD, + Past MI, + CABG (07/30/23), + Peripheral Vascular Disease, +CHF and + DVT  Normal cardiovascular exam+ dysrhythmias Atrial Fibrillation  Rhythm:Regular Rate:Normal  TTE 2025  1. Left ventricular ejection fraction, by estimation, is 50 to 55%. The  left ventricle has low normal function. The left ventricle demonstrates  regional wall motion abnormalities (see scoring diagram/findings for  description). There is mild left  ventricular hypertrophy. Left ventricular diastolic parameters are  indeterminate. Elevated left ventricular end-diastolic pressure. There is  moderate hypokinesis of the left ventricular, mid-apical septal wall and  apical segment.   2. Right ventricular systolic function was not well visualized. The right  ventricular size is not well visualized.   3. Left atrial size was mildly dilated.   4. The mitral valve is degenerative. Trivial mitral valve regurgitation.  No evidence of mitral stenosis. The mean mitral valve gradient is 4.0  mmHg. Severe mitral annular calcification.   5. The aortic valve was not well visualized. Aortic valve regurgitation  is not visualized. Aortic valve sclerosis/calcification is present,  without any evidence of aortic stenosis. Aortic valve mean gradient  measures 7.0 mmHg.   6. Rhythm strip during  this exam demonstrates atrial flutter.     Neuro/Psych  PSYCHIATRIC DISORDERS Anxiety     negative neurological ROS     GI/Hepatic negative GI ROS,,,(+)     substance abuse  alcohol use and marijuana use  Endo/Other  diabetes, Type 2, Insulin  Dependent    Renal/GU negative Renal ROS  negative genitourinary   Musculoskeletal  (+) Arthritis ,    Abdominal   Peds  Hematology  (+) Blood dyscrasia (eliquis )   Anesthesia Other Findings 73 y.o. male with a hx of PAF on eliquis , HFpEF, alcohol use, cannabis use, hx of tobacco use, HLD with statin intolerance, COPD, DM2, CKD2, OSA on CPAP, obesity, remote hx of ICH, provoked DVT. Admitted with A/D HFpEF 2/2 atrial flutter.   Reproductive/Obstetrics                             Anesthesia Physical Anesthesia Plan  ASA: 3  Anesthesia Plan: MAC   Post-op Pain Management:    Induction: Intravenous  PONV Risk Score and Plan: Propofol  infusion and Treatment may vary due to age or medical condition  Airway Management Planned: Natural Airway  Additional Equipment:   Intra-op Plan:   Post-operative Plan:   Informed Consent: I have reviewed the patients History and Physical, chart, labs and discussed the procedure including the risks, benefits and alternatives for the proposed anesthesia with the patient or authorized representative who has indicated his/her understanding and acceptance.     Dental advisory given  Plan Discussed with: CRNA  Anesthesia Plan Comments:        Anesthesia Quick Evaluation

## 2023-08-20 NOTE — Progress Notes (Signed)
   08/20/23 2243  BiPAP/CPAP/SIPAP  BiPAP/CPAP/SIPAP Pt Type Adult  BiPAP/CPAP/SIPAP Resmed  Respiratory Rate 18 breaths/min  EPAP  (16,5)  Flow Rate 4 lpm  Patient Home Machine No  Patient Home Mask No  Patient Home Tubing No  Auto Titrate Yes  Minimum cmH2O 5 cmH2O  Maximum cmH2O 16 cmH2O  Device Plugged into RED Power Outlet Yes  BiPAP/CPAP /SiPAP Vitals  Pulse Rate 90  Resp (!) 21  SpO2 100 %  MEWS Score/Color  MEWS Score 1  MEWS Score Color [name]

## 2023-08-20 NOTE — Progress Notes (Signed)
 PT Cancellation Note  Patient Details Name: [name]. MRN: [id number] DOB: 1950-07-29   Cancelled Treatment:    Reason Eval/Treat Not Completed: (P) Patient at procedure or test/unavailable (pt off unit for TEE) Will continue efforts per PT plan of care as schedule permits.   Zalaya Astarita M Alysabeth Scalia 08/20/2023, 10:22 AM

## 2023-08-20 NOTE — Progress Notes (Signed)
     Transesophageal Echocardiogram Note  Reiner Loewen [id number] 23-Jun-1950  Procedure: Transesophageal Echocardiogram Indications: Atrial flutter   Procedure Details Consent: Obtained Time Out: Verified patient identification, verified procedure, site/side was marked, verified correct patient position, special equipment/implants available, Radiology Safety Procedures followed,  medications/allergies/relevent history reviewed, required imaging and test results available.  Performed  Medications:  Pt sedated by anesthesia with diprovan 260 mg IV total.  Normal LV function; moderate LAE; LAA clipped with small residual; no thrombus noted; mild RAE; trivial pericardial effusion; trileaflet aortic valve with fixed left cusp; trace AI; Mac with mild MR; mild TR and PI; no IAFC.  Pt subsequently underwent successful DCCV with 200J to sinus rhythm.   Complications: No apparent complications Patient did tolerate procedure well.  [name], MD

## 2023-08-20 NOTE — Progress Notes (Addendum)
 Advanced Heart Failure Rounding Note  Cardiologist: None  Chief Complaint: SOB Subjective:    Feels ok. Still gets SOB with activity. Wearing his CPAP. Denies CP.   Gets up to ~750 on IS.   Objective:   Weight Range: 118 kg Body mass index is 33.39 kg/m.   Vital Signs:   Temp:  [97.8 F (36.6 C)-98.6 F (37 C)] 97.8 F (36.6 C) (06/23 0804) Pulse Rate:  [68-91] 91 (06/23 0804) Resp:  [16-20] 18 (06/23 0804) BP: (121-147)/(65-96) 134/69 (06/23 0804) SpO2:  [96 %-100 %] 99 % (06/23 0804) Weight:  [881 kg] 118 kg (06/23 0500) Last BM Date : 08/17/23  Weight change: Filed Weights   08/15/23 1316 08/19/23 0500 08/20/23 0500  Weight: 123.3 kg 120.1 kg 118 kg    Intake/Output:   Intake/Output Summary (Last 24 hours) at 08/20/2023 0826 Last data filed at 08/20/2023 0500 Gross per 24 hour  Intake 380.97 ml  Output 2200 ml  Net -1819.03 ml    Physical Exam  General:  elderly appearing.  HEENT: +CPAP Neck: JVD difficult to see.  Cor: PMI nondisplaced. Regular rate & irregular rhythm. No rubs, gallops or murmurs. Lungs: clear, diminished bases Extremities: no cyanosis, clubbing, rash, +2 BLE edema. Up to L thigh. Ecchymosis to R leg Neuro: alert & oriented x 3. Affect pleasant.   Telemetry   Atrial flutter 70s-80s (Personally reviewed)    EKG    No new EKG to review  Labs    CBC Recent Labs    08/19/23 0354 08/20/23 0414  WBC 8.4 7.9  NEUTROABS 6.4  --   HGB 11.5* 11.1*  HCT 35.4* 34.8*  MCV 87.6 89.2  PLT 525* 465*   Basic Metabolic Panel Recent Labs    93/79/74 0945 08/18/23 0401 08/19/23 0354 08/20/23 0414  NA  --    < > 128* 130*  K  --    < > 3.9 3.7  CL  --    < > 86* 89*  CO2  --    < > 32 33*  GLUCOSE  --    < > 119* 108*  BUN  --    < > 13 14  CREATININE  --    < > 0.94 0.84  CALCIUM   --    < > 8.3* 8.4*  MG 2.0  --   --   --    < > = values in this interval not displayed.   Liver Function Tests No results for input(s):  AST, ALT, ALKPHOS, BILITOT, PROT, ALBUMIN  in the last 72 hours. No results for input(s): LIPASE, AMYLASE in the last 72 hours. Cardiac Enzymes No results for input(s): CKTOTAL, CKMB, CKMBINDEX, TROPONINI in the last 72 hours.  BNP: BNP (last 3 results) Recent Labs    07/24/23 1224 08/15/23 0006  BNP 243.3* 488.7*    ProBNP (last 3 results) No results for input(s): PROBNP in the last 8760 hours.   D-Dimer No results for input(s): DDIMER in the last 72 hours. Hemoglobin A1C No results for input(s): HGBA1C in the last 72 hours. Fasting Lipid Panel No results for input(s): CHOL, HDL, LDLCALC, TRIG, CHOLHDL, LDLDIRECT in the last 72 hours. Thyroid Function Tests No results for input(s): TSH, T4TOTAL, T3FREE, THYROIDAB in the last 72 hours.  Invalid input(s): FREET3  Other results:   Imaging    DG Chest 2 View Result Date: 08/19/2023 CLINICAL DATA:  CABG. EXAM: CHEST - 2 VIEW COMPARISON:  08/17/2023 FINDINGS: Sternotomy wires  unchanged. Lungs are adequately inflated demonstrate stable to slight interval worsening of left base opacification likely moderate effusion with associated Lexis. Infection in the left base is possible. Stable cardiomegaly. Remainder of the exam is unchanged. IMPRESSION: 1. Stable to slight interval worsening of left base opacification likely moderate effusion with associated atelectasis. Infection in the left base is possible. 2. Stable cardiomegaly. Electronically Signed   By: Toribio Agreste M.D.   On: 08/19/2023 13:47     Medications:     Scheduled Medications:  ALPRAZolam   0.25 mg Oral BID   amiodarone   400 mg Oral BID   apixaban   5 mg Oral BID   aspirin  EC  81 mg Oral Daily   azelastine   1 spray Each Nare BID   carvedilol   3.125 mg Oral BID   empagliflozin   10 mg Oral Daily   feeding supplement  237 mL Oral BID BM   ferrous gluconate   324 mg Oral Q breakfast   fluticasone   2 spray Each Nare  BID   furosemide   80 mg Intravenous Daily   insulin  aspart  0-6 Units Subcutaneous Q4H   insulin  glargine-yfgn  20 Units Subcutaneous Daily   olopatadine   1 drop Both Eyes Q12H   polyethylene glycol  34 g Oral QHS   senna-docusate  1 tablet Oral QHS   sodium chloride  flush  3 mL Intravenous Q12H   spironolactone   12.5 mg Oral Daily   terazosin   2 mg Oral QHS   Tiotropium Bromide  Monohydrate  2 puff Inhalation Daily   traZODone   100 mg Oral QHS    Infusions:  sodium chloride  20 mL/hr at 08/20/23 0430   ceFEPime (MAXIPIME) IV 2 g (08/20/23 0218)    PRN Medications: acetaminophen  **OR** acetaminophen , hydrOXYzine , traMADol , trimethobenzamide  Patient Profile   [name]. is a 73 y.o. male with a hx of PAF on eliquis , HFpEF, alcohol use, cannabis use, hx of tobacco use, HLD with statin intolerance, COPD, DM2, CKD2, OSA on CPAP, obesity, remote hx of ICH, provoked DVT. Admitted with A/D HFpEF 2/2 atrial flutter.   Assessment/Plan  Acute on chronic diastolic heart failure LVEF 50-55% - per notes, pt was taking daily 20 mg lasix , 25 mg coreg  BID, 97/103 mg entresto  BID, and 12.5 mg jardiance  outpatient prior to CABG (primarily follows at the [facility], last cards appt 06/29/23) - entresto  discontinued after CABG due to hypotension last admission - echo this admission with LVEF 50-55% - GDTM has been limited by BP - Continue 3.125 mg coreg  BID - Increase 12.5>25 mg spironolactone  - Volume remains elevated. Increase lasix  to 80 mg IV daily to BID. Follow response. Will also give diamox 500 mg BID.  - Continue Jardiance  10 mg today - Place compression hose.    CABG x 3 07/30/23 - no chest pain, expected chest soreness - continue ASA, no plavix given eliquis    PAF Atrial flutter with CVR - eliquis  was resumed on 08/02/23 after CABG - EKG with atrial flutter with variable block - Currently rate controlled - Continue coreg  3.125 mg BID - Suspect flutter is contributing to SOB - Plan  for TEE/DCCV today.    Chronic anticoagulation - on ASA post CABG in the setting of ACS presentation - on eliquis  without bleeding - restarted on 08/02/23 - PLT trending down to 500   Hyperlipidemia with LDL goal < 55 Consider lower LDL goal given smoking history 07/25/2023: Cholesterol 214; HDL 44; LDL Cholesterol 154; Triglycerides 79; VLDL 16 He is statin  intolerant and reported oral swelling with zetia - will need PCSK9i OP - defer to [facility] unless he wants to transition care to [facility]   Prior DVT - 2006 Prior subdural hematoma - 2010 - has done well on eliquis    Length of Stay: 5  Alan LITTIE Coe, NP  08/20/2023, 8:26 AM  Advanced Heart Failure Team Pager [phone] (M-F; 7a - 5p)  Please contact [facility] for night-coverage after hours (5p -7a ) and weekends on [email or web address]

## 2023-08-20 NOTE — Plan of Care (Signed)

## 2023-08-20 NOTE — Interval H&P Note (Signed)
 History and Physical Interval Note:  08/20/2023 11:28 AM  [name].  has presented today for surgery, with the diagnosis of aflutter.  The various methods of treatment have been discussed with the patient and family. After consideration of risks, benefits and other options for treatment, the patient has consented to  Procedure(s): TRANSESOPHAGEAL ECHOCARDIOGRAM (N/A) CARDIOVERSION (N/A) as a surgical intervention.  The patient's history has been reviewed, patient examined, no change in status, stable for surgery.  I have reviewed the patient's chart and labs.  Questions were answered to the patient's satisfaction.     [name]

## 2023-08-20 NOTE — H&P (View-Only) (Signed)
      [address]       [name] [address]             [phone]        Procedure(s) (LRB): TRANSESOPHAGEAL ECHOCARDIOGRAM (N/A) CARDIOVERSION (N/A) Subjective: Resting in bed with C-PAP in place. He doesn't feel his breathing is any different from 3 days ago when I last saw him. He is comfortable at rest but gets short of breath with minimal activity.   Net 12L diuresis since admission.   Objective: Vital signs in last 24 hours: Temp:  [98.1 F (36.7 C)-98.6 F (37 C)] 98.6 F (37 C) (06/23 0420) Pulse Rate:  [68-91] 68 (06/23 0420) Cardiac Rhythm: Atrial flutter (06/22 2201) Resp:  [16-20] 16 (06/23 0420) BP: (121-147)/(65-96) 141/66 (06/23 0420) SpO2:  [96 %-100 %] 100 % (06/23 0420) Weight:  [881 kg] 118 kg (06/23 0500)    Intake/Output from previous day: 06/22 0701 - 06/23 0700 In: 381 [P.O.:240; I.V.:141] Out: 3200 [Urine:3200] Intake/Output this shift: No intake/output data recorded.  General appearance: alert, cooperative, and mild distress Neurologic: intact Heart: a-flutter with VR ~80-90 Lungs: breath sounds clear, shallow. Normal work of breathing on C-PAP.  Abdomen: obese, soft, no tenderness Extremities: bruising right thigh unchanged, no palpable hematoma. Wound: the sternotomy incision is healing well.   Lab Results: Recent Labs    08/19/23 0354 08/20/23 0414  WBC 8.4 7.9  HGB 11.5* 11.1*  HCT 35.4* 34.8*  PLT 525* 465*   BMET:  Recent Labs    08/19/23 0354 08/20/23 0414  NA 128* 130*  K 3.9 3.7  CL 86* 89*  CO2 32 33*  GLUCOSE 119* 108*  BUN 13 14  CREATININE 0.94 0.84  CALCIUM  8.3* 8.4*    PT/INR: No results for input(s): LABPROT, INR in the last 72 hours. ABG    Component Value Date/Time   PHART 7.330 (L) 07/31/2023 0134   HCO3 24.8 07/31/2023 0134   TCO2 26 07/31/2023 0134   ACIDBASEDEF 1.0 07/31/2023 0134   O2SAT 97 07/31/2023 0134   CBG (last 3)  Recent Labs    08/19/23 2001 08/20/23 0017  08/20/23 0412  GLUCAP 129* 136* 99    Assessment/Plan: S/P Procedure(s) (LRB): TRANSESOPHAGEAL ECHOCARDIOGRAM (N/A) CARDIOVERSION (N/A)  -Hospital day 5 re-admission post CABG with progressive shortness of breath, acute on chronic diastolic HF, a-flutter. Appreciate mgt per hospitalists and cardiology. Has had net 12L diuresis since admission.   -A-Flutter: now being loaded with oral amiodarone . On apixaban . TEE with DC cardioversion planned for later this morning.   -ID: has worsening density left base concerning for HCAP.  Started on IV cefepime. Not febrile and has normal WBC.  Continue to encourage work on pulmonary hygiene and mobility.     LOS: 5 days    [name] G. Alan Leavelle, PA-C 08/20/2023

## 2023-08-20 NOTE — Progress Notes (Addendum)
 Physical Therapy Treatment Patient Details Name: [name]. MRN: [id number] DOB: [birth year] Today's Date: 08/20/2023   History of Present Illness [name]. is a 73 y.o. male admitted 08/14/23 for CHF exacerbation. PMHx: HTN, T2DM, chronic diastolic CHF, PAF on Eliquis , obesity, OSA, and CAD s/p CABG x3 on 07/30/2023.    PT Comments  Pt received in supine after return from procedure, pt pleasantly agreeable to therapy session and eager to work on OOB to chair and for arrival of his lunch. Pt needing up to minA for bed mobility and transfers with [facility] fully elevated and bed height significantly raised per pt request to simulate home set-up. Pt needing up to CGA for short household distance gait trial in his room. BP WFL supine and sitting and SpO2 WFL on 2L O2 [place] during gait trial. Pressure relief air cushion in his chair and pt set up to eat lunch in his recliner at end of session. Patient will benefit from continued inpatient follow up therapy, <3 hours/day.    If plan is discharge home, recommend the following: Assistance with cooking/housework;Assist for transportation;Help with stairs or ramp for entrance;A little help with walking and/or transfers;A little help with bathing/dressing/bathroom   Can travel by private vehicle     Yes  Equipment Recommendations  None recommended by PT    Recommendations for Other Services       Precautions / Restrictions Precautions Precautions: Sternal;Fall Precaution Booklet Issued: Yes (comment) Recall of Precautions/Restrictions: Impaired Precaution/Restrictions Comments: Pt remembered move in the tube, but required frequent cues to maintain functionally. Restrictions Weight Bearing Restrictions Per Provider Order: No Other Position/Activity Restrictions: sternal precs     Mobility  Bed Mobility Overal bed mobility: Needs Assistance Bed Mobility: Rolling, Sidelying to Sit Rolling: Supervision Sidelying to sit: HOB elevated,  Used rails, Min assist       General bed mobility comments: At pt request HOB highly elevated, min assist for trunk toward L EOB, dense cues before and during to avoid him winging his L elbow into mattress while sitting up.    Transfers Overall transfer level: Needs assistance Equipment used: Rolling walker (2 wheels) Transfers: Sit to/from Stand, Bed to chair/wheelchair/BSC Sit to Stand: From elevated surface, Contact guard assist, Mod assist, Min assist           General transfer comment: From highly elevated surface CGA (to simulate pt's bed height), minA for stand>sit to lower surface height with cues for safer UE placement during transfers.    Ambulation/Gait Ambulation/Gait assistance: Contact guard assist Gait Distance (Feet): 20 Feet Assistive device: Rolling walker (2 wheels) Gait Pattern/deviations: Step-through pattern, Trunk flexed, Decreased stride length Gait velocity: decreased     General Gait Details: Distance limited 2/2 pt request after recent return from TEE/DCCV procedure, pt requesting to sit in chair and eat as he had been NPO for procedure until this time. Pt needing up to CGA for safety, min cues for Move in the Tube precs and not to keep RW too far advanced. SpO2 WFL on 2L O2 [place] throughout, RN notified.   Stairs             Wheelchair Mobility     Tilt Bed    Modified Rankin (Stroke Patients Only)       Balance Overall balance assessment: Needs assistance Sitting-balance support: Feet supported Sitting balance-Leahy Scale: Good     Standing balance support: Bilateral upper extremity supported, During functional activity, Reliant on assistive device for balance Standing  balance-Leahy Scale: Poor Standing balance comment: RW and CGA for safety with dynamic standing tasks; Supervision for static standing at RW.                            Communication Communication Communication: No apparent difficulties  Cognition  Arousal: Alert Behavior During Therapy: WFL for tasks assessed/performed   PT - Cognitive impairments: Memory, Sequencing, Problem solving                       PT - Cognition Comments: Pt A&O x 4, but needs reminders for sternal precs during functional tasks. Following commands: Intact      Cueing Cueing Techniques: Verbal cues, Gestural cues, Visual cues  Exercises      General Comments General comments (skin integrity, edema, etc.): pt has TED hose ordered but none seen in room, RN notified.      Pertinent Vitals/Pain Pain Assessment Pain Assessment: Faces Faces Pain Scale: Hurts a little bit Pain Location: scrotal area with positional changes Pain Descriptors / Indicators: Guarding, Grimacing Pain Intervention(s): Monitored during session, Repositioned    Home Living                          Prior Function            PT Goals (current goals can now be found in the care plan section) Acute Rehab PT Goals Patient Stated Goal: To return to rehab so I can get strong enough to care for my wife, who has advanced Parkinson's. PT Goal Formulation: With patient Time For Goal Achievement: 08/30/23 Progress towards PT goals: Progressing toward goals    Frequency    Min 2X/week      PT Plan      Co-evaluation              AM-PAC PT 6 Clicks Mobility   Outcome Measure  Help needed turning from your back to your side while in a flat bed without using bedrails?: A Little Help needed moving from lying on your back to sitting on the side of a flat bed without using bedrails?: A Little Help needed moving to and from a bed to a chair (including a wheelchair)?: A Little Help needed standing up from a chair using your arms (e.g., wheelchair or bedside chair)?: A Little Help needed to walk in hospital room?: A Little Help needed climbing 3-5 steps with a railing? : A Lot 6 Click Score: 17    End of Session Equipment Utilized During Treatment:  Gait belt;Oxygen  Activity Tolerance: Patient tolerated treatment well;Other (comment) (pt c/o fatigue due to not eating in AM, pt defers longer distance) Patient left: in chair;with call bell/phone within reach;Other (comment) (no chair alarm in chair, pt A&O agreeable to use call bell prior to getting up.) Nurse Communication: Mobility status;Other (comment) (pt on 2L O2 [place] and WFL amb and sitting; order for TED hose but don't see any in his room yet) PT Visit Diagnosis: Difficulty in walking, not elsewhere classified (R26.2);Other abnormalities of gait and mobility (R26.89) Pain - part of body:  (scrotal region)     Time: 1310-1346 PT Time Calculation (min) (ACUTE ONLY): 36 min  Charges:    $Gait Training: 8-22 mins $Therapeutic Activity: 8-22 mins PT General Charges $$ ACUTE PT VISIT: 1 Visit  Connell SQUIBB., PTA Acute Rehabilitation Services Secure Chat Preferred 9a-5:30pm Office: [phone]    Connell HERO [facility] 08/20/2023, 4:11 PM

## 2023-08-20 NOTE — Progress Notes (Signed)
 PROGRESS NOTE  [name]. FMW:[id number] DOB: 04-30-1950 DOA: 08/14/2023 PCP: [facility]  HPI/Recap of past 24 hours: Tiberius Loftus. is a 73 y.o. 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 Pt was told to hold his lasix  2/2 ?hyponatremia prior to d/c to SNF. Pt was placed on CPAP with EMS. Upon arrival to the ED, patient is found to be afebrile, on CPAP with tachypnea, elevated BP.  Labs are most notable for sodium 128, troponin 83, and BNP 489.  CTA chest is negative for PE but notable for nondisplaced right posterior first rib fracture and small bilateral pleural effusions with partial loculation on the left. Cardiothoracic surgery was consulted by the EDP.  Patient admitted for further management.    Today, saw patient after cardioversion and TEE, denies any new complaints.  Remains in normal sinus rhythm rate controlled.  Still reporting some SOB but overall has improved from admission.   Assessment/Plan: Principal Problem:   Acute on chronic diastolic CHF (congestive heart failure) (HCC) Active Problems:   Essential hypertension, benign   Type 2 diabetes mellitus with hyperglycemia, with long-term current use of insulin  (HCC)   Sleep apnea   Hyponatremia   COPD (chronic obstructive pulmonary disease) (HCC)   Coronary artery disease involving native coronary artery of native heart without angina pectoris   Paroxysmal atrial fibrillation (HCC)   Generalized anxiety disorder   Acute on chronic HFpEF; pleural effusions   Currently on 3 L of O2 nasal cannula, plan to wean off as able BNP 488.7, troponin 82->80, flat trend EKG noted atrial flutter, rate controlled, noted QTc prolongation Repeat echo with EF of 50 to 55%,  noted regional wall motion abnormality CTA chest with no evidence of PE, no pneumothorax, small bilateral pleural effusion, no frank interstitial edema Repeat chest x-ray showed stable to slight interval worsening of left base opacification likely moderate effusion with associated atelectasis.  Infection in left base is possible Continue diuresing with IV Lasix  Cardiothoracic surgery consulted for further management Cardiology consulted, appreciate recs, continue Lasix , Coreg , spironolactone , Jardiance  Monitor weight and I/Os, monitor renal function and electrolytes  ??HCAP ?Small left loculated effusion Afebrile, no leukocytosis Dyspnea has improved Procalcitonin 0.15 Repeat chest x-ray showed stable to slight interval worsening of left base opacification likely moderate effusion with associated atelectasis.  Infection in left base is possible Continue IV cefepime for now given recent CABG and hospitalization  Hypokalemia Replace as needed   CAD s/p CABG  S/p CABG on 07/30/23 No anginal symptoms  Continue ASA and beta-blocker    PAF s/p cardioversion on 6/23 QTc prolongation Now in sinus rhythm Continue amiodarone  Continue Eliquis , Coreg   Cardiology on board, s/p TEE on 6/23 with normal LV function, no thrombus noted, trivial pericardial effusion, subsequently underwent successful DCCV to sinus rhythm   Normocytic anemia Hemoglobin around baseline Anemia panel showed iron  34, sats 11 Gave 1 dose of IV iron  on 6/19, start oral iron  supplementation Daily CBC   Diabetes mellitus type 2 Last A1c 6.2 on 06/2023 SSI, Accu-Cheks, Semglee , hypoglycemic protocol   Hyponatremia  Daily BMP   OSA on CPAP Continue CPAP   Anxiety, insomnia  Continue ativan  and trazodone      Single non-displaced rib fracture  Pain-control, incentive  spirometry   Class I obesity Lifestyle modification advised    Estimated body mass index is 33.39 kg/m as calculated from the following:    Height as of this encounter: 6' 2 (1.88 m).   Weight as of this encounter: 118 kg.     Code Status: Full  Family Communication: Discussed with daughter on 08/15/2023  Disposition Plan: Status is: Inpatient Remains inpatient appropriate because: Level of care      Consultants: Cardiothoracic surgery Cardiology/HF  Procedures: None  Antimicrobials: Cefepime  DVT prophylaxis: Eliquis    Objective: Vitals:   08/20/23 1156 08/20/23 1200 08/20/23 1210 08/20/23 1228  BP:  129/63 134/61 139/62  Pulse: 82 83 83 82  Resp: (!) 22 (!) 25 (!) 24 18  Temp:    97.6 F (36.4 C)  TempSrc:    Oral  SpO2: 98% 98% 100% 100%  Weight:      Height:        Intake/Output Summary (Last 24 hours) at 08/20/2023 1400 Last data filed at 08/20/2023 0500 Gross per 24 hour  Intake 380.97 ml  Output 2200 ml  Net -1819.03 ml   Filed Weights   08/15/23 1316 08/19/23 0500 08/20/23 0500  Weight: 123.3 kg 120.1 kg 118 kg    Exam: General: NAD  Cardiovascular: S1, S2 present Respiratory: Diminished breath sounds bilaterally Abdomen: Soft, nontender, nondistended, bowel sounds present Musculoskeletal: +bilateral pedal edema noted Skin: Noted bruising on right lower extremity thigh around vein harvest sites Psychiatry: Normal mood     Data Reviewed: CBC: Recent Labs  Lab 08/16/23 0328 08/17/23 0255 08/18/23 0401 08/19/23 0354 08/20/23 0414  WBC 9.4 7.7 7.7 8.4 7.9  NEUTROABS  --   --   --  6.4  --   HGB 10.7* 10.5* 10.4* 11.5* 11.1*  HCT 32.1* 31.8* 32.2* 35.4* 34.8*  MCV 89.7 88.1 89.0 87.6 89.2  PLT 513* 500* 465* 525* 465*   Basic Metabolic Panel: Recent Labs  Lab 08/15/23 0647 08/16/23 0328 08/17/23 0255 08/17/23 0945 08/18/23 0401 08/19/23 0354 08/20/23 0414  NA 130* 130* 130*  --  130* 128* 130*  K 3.6 3.6 3.4*  --  4.1 3.9 3.7  CL 93* 91* 88*  --  90* 86* 89*  CO2 28 32 32  --  30 32 33*  GLUCOSE 141* 107* 106*  --  122* 119* 108*  BUN 10 11 11   --  13 13 14    CREATININE 0.75 0.80 0.71  --  0.85 0.94 0.84  CALCIUM  7.9* 8.3* 8.2*  --  8.4* 8.3* 8.4*  MG 2.1  --   --  2.0  --   --   --    GFR: Estimated Creatinine Clearance: 108.5 mL/min (by C-G formula based on SCr of 0.84 mg/dL). Liver Function Tests: No results for input(s): AST, ALT, ALKPHOS, BILITOT, PROT, ALBUMIN  in the last 168 hours. No results for input(s): LIPASE, AMYLASE in the last 168 hours. No results for input(s): AMMONIA in the last 168 hours. Coagulation Profile: No results for input(s): INR, PROTIME in the last 168 hours. Cardiac Enzymes: No results for input(s): CKTOTAL, CKMB, CKMBINDEX, TROPONINI in the last 168 hours. BNP (last 3 results) No results for input(s): PROBNP in the last 8760 hours. HbA1C: No results for input(s): HGBA1C in the last 72 hours. CBG: Recent Labs  Lab 08/19/23 2001 08/20/23 0017 08/20/23 0412 08/20/23 0806 08/20/23 1227  GLUCAP 129* 136* 99 105* 121*   Lipid Profile: No results for input(s): CHOL, HDL, LDLCALC,  TRIG, CHOLHDL, LDLDIRECT in the last 72 hours. Thyroid Function Tests: No results for input(s): TSH, T4TOTAL, FREET4, T3FREE, THYROIDAB in the last 72 hours. Anemia Panel: No results for input(s): VITAMINB12, FOLATE, FERRITIN, TIBC, IRON , RETICCTPCT in the last 72 hours.  Urine analysis:    Component Value Date/Time   COLORURINE YELLOW 08/08/2023 0916   APPEARANCEUR HAZY (A) 08/08/2023 0916   LABSPEC 1.014 08/08/2023 0916   PHURINE 6.0 08/08/2023 0916   GLUCOSEU NEGATIVE 08/08/2023 0916   HGBUR SMALL (A) 08/08/2023 0916   BILIRUBINUR NEGATIVE 08/08/2023 0916   KETONESUR NEGATIVE 08/08/2023 0916   PROTEINUR NEGATIVE 08/08/2023 0916   UROBILINOGEN 1.0 12/21/2012 1341   NITRITE NEGATIVE 08/08/2023 0916   LEUKOCYTESUR LARGE (A) 08/08/2023 0916   Sepsis Labs: @LABRCNTIP (procalcitonin:4,lacticidven:4)  ) No results found for this or any previous visit  (from the past 240 hours).     Studies: ECHO TEE Result Date: 08/20/2023    TRANSESOPHOGEAL ECHO REPORT   Patient Name:   Bush Murdoch. Date of Exam: 08/20/2023 Medical Rec #:  [id number]           Height:       74.0 in Accession #:    [id number]          Weight:       260.1 lb Date of Birth:  Apr 29, 1950            BSA:          2.430 m Patient Age:    72 years            BP:           153/81 mmHg Patient Gender: M                   HR:           83 bpm. Exam Location:  Inpatient Procedure: Transesophageal Echo, Cardiac Doppler and Color Doppler (Both            Spectral and Color Flow Doppler were utilized during procedure). Indications:     A flutter  History:         Patient has prior history of Echocardiogram examinations, most                  recent 08/15/2023. CAD, COPD; Risk Factors:Hypertension.  Sonographer:     Philomena Daring Referring Phys:  [id number] JON GARRE DUKE Diagnosing Phys: [name] MD PROCEDURE: After discussion of the risks and benefits of a TEE, an informed consent was obtained from the patient. The transesophogeal probe was passed without difficulty through the esophogus of the patient. Imaged were obtained with the patient in a left lateral decubitus position. Sedation performed by different physician. The patient was monitored while under deep sedation. Anesthestetic sedation was provided intravenously by Anesthesiology: 260mg  of Propofol . Image quality was good. The patient developed no complications during the procedure. A successful direct current cardioversion was performed at 200 joules with 1 attempt.  IMPRESSIONS  1. Left ventricular ejection fraction, by estimation, is 60 to 65%. The left ventricle has normal function. The left ventricle has no regional wall motion abnormalities.  2. Right ventricular systolic function is normal. The right ventricular size is normal.  3. Left atrial appendage previously clipped with small residual. Left atrial size was moderately  dilated. No left atrial/left atrial appendage thrombus was detected.  4. Right atrial size was mildly dilated.  5. The mitral valve is normal in structure. Mild mitral valve  regurgitation.  6. Left coronary cusp is fixed. The aortic valve is tricuspid. Aortic valve regurgitation is trivial.  7. There is mild (Grade II) plaque involving the descending aorta. FINDINGS  Left Ventricle: Left ventricular ejection fraction, by estimation, is 60 to 65%. The left ventricle has normal function. The left ventricle has no regional wall motion abnormalities. The left ventricular internal cavity size was normal in size. Right Ventricle: The right ventricular size is normal. Right ventricular systolic function is normal. Left Atrium: Left atrial appendage previously clipped with small residual. Left atrial size was moderately dilated. No left atrial/left atrial appendage thrombus was detected. Right Atrium: Right atrial size was mildly dilated. Pericardium: Trivial pericardial effusion is present. Mitral Valve: The mitral valve is normal in structure. Mild mitral annular calcification. Mild mitral valve regurgitation. Tricuspid Valve: The tricuspid valve is normal in structure. Tricuspid valve regurgitation is mild. Aortic Valve: Left coronary cusp is fixed. The aortic valve is tricuspid. Aortic valve regurgitation is trivial. Pulmonic Valve: The pulmonic valve was normal in structure. Pulmonic valve regurgitation is mild. Aorta: The aortic root is normal in size and structure. There is mild (Grade II) plaque involving the descending aorta. IAS/Shunts: No atrial level shunt detected by color flow Doppler.   AORTA Ao Root diam: 3.20 cm Ao Asc diam:  3.20 cm [name] MD Electronically signed by [name] MD Signature Date/Time: 08/20/2023/12:26:35 PM    Final    EP STUDY Result Date: 08/20/2023 See surgical note for result.     Scheduled Meds:  acetaZOLAMIDE  500 mg Oral BID   ALPRAZolam   0.25 mg Oral BID    amiodarone   400 mg Oral BID   apixaban   5 mg Oral BID   aspirin  EC  81 mg Oral Daily   azelastine   1 spray Each Nare BID   carvedilol   3.125 mg Oral BID   empagliflozin   10 mg Oral Daily   feeding supplement  237 mL Oral BID BM   ferrous gluconate   324 mg Oral Q breakfast   fluticasone   2 spray Each Nare BID   furosemide   80 mg Intravenous BID   insulin  aspart  0-6 Units Subcutaneous Q4H   insulin  glargine-yfgn  20 Units Subcutaneous Daily   olopatadine   1 drop Both Eyes Q12H   polyethylene glycol  34 g Oral QHS   potassium chloride   40 mEq Oral Once   senna-docusate  1 tablet Oral QHS   sodium chloride  flush  3 mL Intravenous Q12H   spironolactone   12.5 mg Oral Daily   terazosin   2 mg Oral QHS   Tiotropium Bromide  Monohydrate  2 puff Inhalation Daily   traZODone   100 mg Oral QHS    Continuous Infusions:  ceFEPime (MAXIPIME) IV 2 g (08/20/23 0218)      LOS: 5 days     Lebron JINNY Cage, MD [facility]  If 7PM-7AM, please contact night-coverage [email or web address] 08/20/2023, 2:00 PM

## 2023-08-20 NOTE — Transfer of Care (Signed)
 Immediate Anesthesia Transfer of Care Note  Patient: [name].  Procedure(s) Performed: TRANSESOPHAGEAL ECHOCARDIOGRAM CARDIOVERSION  Patient Location: Cath Lab  Anesthesia Type:General  Level of Consciousness: drowsy  Airway & Oxygen  Therapy: Patient Spontanous Breathing and Patient connected to face mask oxygen   Post-op Assessment: Report given to RN and Post -op Vital signs reviewed and stable  Post vital signs: Reviewed and stable  Last Vitals:  Vitals Value Taken Time  BP    Temp    Pulse    Resp    SpO2      Last Pain:  Vitals:   08/20/23 0944  TempSrc:   PainSc: 0-No pain      Patients Stated Pain Goal: 0 (08/15/23 1509)  Complications: No notable events documented.

## 2023-08-20 NOTE — Progress Notes (Signed)
      [address]       [name] [address]             [phone]        Procedure(s) (LRB): TRANSESOPHAGEAL ECHOCARDIOGRAM (N/A) CARDIOVERSION (N/A) Subjective: Resting in bed with C-PAP in place. He doesn't feel his breathing is any different from 3 days ago when I last saw him. He is comfortable at rest but gets short of breath with minimal activity.   Net 12L diuresis since admission.   Objective: Vital signs in last 24 hours: Temp:  [98.1 F (36.7 C)-98.6 F (37 C)] 98.6 F (37 C) (06/23 0420) Pulse Rate:  [68-91] 68 (06/23 0420) Cardiac Rhythm: Atrial flutter (06/22 2201) Resp:  [16-20] 16 (06/23 0420) BP: (121-147)/(65-96) 141/66 (06/23 0420) SpO2:  [96 %-100 %] 100 % (06/23 0420) Weight:  [881 kg] 118 kg (06/23 0500)    Intake/Output from previous day: 06/22 0701 - 06/23 0700 In: 381 [P.O.:240; I.V.:141] Out: 3200 [Urine:3200] Intake/Output this shift: No intake/output data recorded.  General appearance: alert, cooperative, and mild distress Neurologic: intact Heart: a-flutter with VR ~80-90 Lungs: breath sounds clear, shallow. Normal work of breathing on C-PAP.  Abdomen: obese, soft, no tenderness Extremities: bruising right thigh unchanged, no palpable hematoma. Wound: the sternotomy incision is healing well.   Lab Results: Recent Labs    08/19/23 0354 08/20/23 0414  WBC 8.4 7.9  HGB 11.5* 11.1*  HCT 35.4* 34.8*  PLT 525* 465*   BMET:  Recent Labs    08/19/23 0354 08/20/23 0414  NA 128* 130*  K 3.9 3.7  CL 86* 89*  CO2 32 33*  GLUCOSE 119* 108*  BUN 13 14  CREATININE 0.94 0.84  CALCIUM  8.3* 8.4*    PT/INR: No results for input(s): LABPROT, INR in the last 72 hours. ABG    Component Value Date/Time   PHART 7.330 (L) 07/31/2023 0134   HCO3 24.8 07/31/2023 0134   TCO2 26 07/31/2023 0134   ACIDBASEDEF 1.0 07/31/2023 0134   O2SAT 97 07/31/2023 0134   CBG (last 3)  Recent Labs    08/19/23 2001 08/20/23 0017  08/20/23 0412  GLUCAP 129* 136* 99    Assessment/Plan: S/P Procedure(s) (LRB): TRANSESOPHAGEAL ECHOCARDIOGRAM (N/A) CARDIOVERSION (N/A)  -Hospital day 5 re-admission post CABG with progressive shortness of breath, acute on chronic diastolic HF, a-flutter. Appreciate mgt per hospitalists and cardiology. Has had net 12L diuresis since admission.   -A-Flutter: now being loaded with oral amiodarone . On apixaban . TEE with DC cardioversion planned for later this morning.   -ID: has worsening density left base concerning for HCAP.  Started on IV cefepime. Not febrile and has normal WBC.  Continue to encourage work on pulmonary hygiene and mobility.     LOS: 5 days    Margie Urbanowicz G. Claudean Leavelle, PA-C 08/20/2023

## 2023-08-21 ENCOUNTER — Encounter (HOSPITAL_COMMUNITY): Payer: Self-pay | Admitting: Cardiology

## 2023-08-21 DIAGNOSIS — I483 Typical atrial flutter: Secondary | ICD-10-CM | POA: Diagnosis not present

## 2023-08-21 DIAGNOSIS — I5033 Acute on chronic diastolic (congestive) heart failure: Secondary | ICD-10-CM | POA: Diagnosis not present

## 2023-08-21 LAB — GLUCOSE, CAPILLARY
Glucose-Capillary: 101 mg/dL — ABNORMAL HIGH (ref 70–99)
Glucose-Capillary: 103 mg/dL — ABNORMAL HIGH (ref 70–99)
Glucose-Capillary: 134 mg/dL — ABNORMAL HIGH (ref 70–99)
Glucose-Capillary: 141 mg/dL — ABNORMAL HIGH (ref 70–99)
Glucose-Capillary: 144 mg/dL — ABNORMAL HIGH (ref 70–99)
Glucose-Capillary: 179 mg/dL — ABNORMAL HIGH (ref 70–99)

## 2023-08-21 LAB — BASIC METABOLIC PANEL WITH GFR
Anion gap: 12 (ref 5–15)
BUN: 17 mg/dL (ref 8–23)
CO2: 31 mmol/L (ref 22–32)
Calcium: 8.5 mg/dL — ABNORMAL LOW (ref 8.9–10.3)
Chloride: 90 mmol/L — ABNORMAL LOW (ref 98–111)
Creatinine, Ser: 0.86 mg/dL (ref 0.61–1.24)
GFR, Estimated: >60 mL/min (ref >60)
Glucose, Bld: 104 mg/dL — ABNORMAL HIGH (ref 70–99)
Potassium: 3.4 mmol/L — ABNORMAL LOW (ref 3.5–5.1)
Sodium: 133 mmol/L — ABNORMAL LOW (ref 135–145)

## 2023-08-21 LAB — MAGNESIUM: Magnesium: 2.1 mg/dL (ref 1.7–2.4)

## 2023-08-21 MED ORDER — ACETAZOLAMIDE 250 MG PO TABS
500.0000 mg | ORAL_TABLET | Freq: Two times a day (BID) | ORAL | Status: AC
  Administered 2023-08-21 (×2): 500 mg via ORAL
  Filled 2023-08-21 (×2): qty 2

## 2023-08-21 MED ORDER — POTASSIUM CHLORIDE CRYS ER 20 MEQ PO TBCR: 40.0000 meq | EXTENDED_RELEASE_TABLET | Freq: Once | ORAL | Status: DC

## 2023-08-21 MED ORDER — POTASSIUM CHLORIDE CRYS ER 20 MEQ PO TBCR
40.0000 meq | EXTENDED_RELEASE_TABLET | Freq: Two times a day (BID) | ORAL | Status: AC
  Administered 2023-08-21 (×2): 40 meq via ORAL
  Filled 2023-08-21 (×2): qty 2

## 2023-08-21 MED ORDER — AMLODIPINE BESYLATE 5 MG PO TABS: 5.0000 mg | ORAL_TABLET | Freq: Every day | ORAL | Status: DC

## 2023-08-21 MED ORDER — CARVEDILOL 6.25 MG PO TABS
6.2500 mg | ORAL_TABLET | Freq: Two times a day (BID) | ORAL | Status: DC
  Administered 2023-08-21 – 2023-08-23 (×6): 6.25 mg via ORAL
  Filled 2023-08-21 (×6): qty 1

## 2023-08-21 MED ORDER — SPIRONOLACTONE 25 MG PO TABS
25.0000 mg | ORAL_TABLET | Freq: Every day | ORAL | Status: DC
  Administered 2023-08-21 – 2023-08-27 (×7): 25 mg via ORAL
  Filled 2023-08-21 (×7): qty 1

## 2023-08-21 MED ORDER — POTASSIUM CHLORIDE CRYS ER 20 MEQ PO TBCR
20.0000 meq | EXTENDED_RELEASE_TABLET | Freq: Once | ORAL | Status: AC
  Administered 2023-08-21: 20 meq via ORAL
  Filled 2023-08-21: qty 1

## 2023-08-21 MED ORDER — FUROSEMIDE 10 MG/ML IJ SOLN
80.0000 mg | Freq: Three times a day (TID) | INTRAMUSCULAR | Status: AC
  Administered 2023-08-21 (×3): 80 mg via INTRAVENOUS
  Filled 2023-08-21 (×3): qty 8

## 2023-08-21 NOTE — Anesthesia Postprocedure Evaluation (Signed)
 Anesthesia Post Note  Patient: [name].  Procedure(s) Performed: TRANSESOPHAGEAL ECHOCARDIOGRAM CARDIOVERSION     Patient location during evaluation: Cath Lab Anesthesia Type: MAC Level of consciousness: awake and alert Pain management: pain level controlled Vital Signs Assessment: post-procedure vital signs reviewed and stable Respiratory status: spontaneous breathing, nonlabored ventilation, respiratory function stable and patient connected to nasal cannula oxygen  Cardiovascular status: blood pressure returned to baseline and stable Postop Assessment: no apparent nausea or vomiting Anesthetic complications: no  No notable events documented.  Last Vitals:  Vitals:   08/21/23 0727 08/21/23 1126  BP: (!) 155/74 (!) 142/68  Pulse: 84 82  Resp: 20 (!) 25  Temp: 36.6 C 36.8 C  SpO2: 97% 95%    Last Pain:  Vitals:   08/21/23 1126  TempSrc: Oral  PainSc:    Pain Goal: Patients Stated Pain Goal: 0 (08/15/23 1509)                 Jashawn Floyd L Grady Mohabir

## 2023-08-21 NOTE — Progress Notes (Signed)
 PROGRESS NOTE  [name]. FMW:[id number] DOB: May 11, 1950 DOA: 08/14/2023 PCP: [facility]  HPI/Recap of past 24 hours: Alan Ding. is a 73 y.o. 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 Pt was told to hold his lasix  2/2 ?hyponatremia prior to d/c to SNF. Pt was placed on CPAP with EMS. Upon arrival to the ED, patient is found to be afebrile, on CPAP with tachypnea, elevated BP.  Labs are most notable for sodium 128, troponin 83, and BNP 489.  CTA chest is negative for PE but notable for nondisplaced right posterior first rib fracture and small bilateral pleural effusions with partial loculation on the left. Cardiothoracic surgery was consulted by the EDP.  Patient admitted for further management.    Today, patient denies any new complaints.   Assessment/Plan: Principal Problem:   Acute on chronic diastolic CHF (congestive heart failure) (HCC) Active Problems:   Essential hypertension, benign   Type 2 diabetes mellitus with hyperglycemia, with long-term current use of insulin  (HCC)   Sleep apnea   Hyponatremia   COPD (chronic obstructive pulmonary disease) (HCC)   Coronary artery disease involving native coronary artery of native heart without angina pectoris   Paroxysmal atrial fibrillation (HCC)   Generalized anxiety disorder   Acute on chronic HFpEF; pleural effusions   Currently on 3 L of O2 nasal cannula, plan to wean off as able BNP 488.7, troponin 82->80, flat trend EKG noted atrial flutter, rate controlled, noted QTc prolongation Repeat echo with EF of 50 to 55%, noted regional wall motion abnormality CTA chest with no evidence of PE, no pneumothorax, small bilateral pleural effusion, no frank interstitial  edema Repeat chest x-ray showed stable to slight interval worsening of left base opacification likely moderate effusion with associated atelectasis.  Infection in left base is possible Continue diuresing with IV Lasix  Cardiothoracic surgery consulted for further management Cardiology consulted, appreciate recs, continue Lasix , Coreg , spironolactone , Jardiance  Monitor weight and I/Os, monitor renal function and electrolytes  ??HCAP ?Small left loculated effusion Afebrile, no leukocytosis Dyspnea has improved Procalcitonin 0.15 Repeat chest x-ray showed stable to slight interval worsening of left base opacification likely moderate effusion with associated atelectasis.  Infection in left base is possible Continue IV cefepime for now given recent CABG and hospitalization  Hypokalemia Replace as needed   CAD s/p CABG  S/p CABG on 07/30/23 No anginal symptoms  Continue ASA and beta-blocker    PAF s/p cardioversion on 6/23 QTc prolongation Now in sinus rhythm Continue amiodarone  Continue Eliquis , Coreg   Cardiology on board, s/p TEE on 6/23 with normal LV function, no thrombus noted, trivial pericardial effusion, subsequently underwent successful DCCV to sinus rhythm   Normocytic anemia Hemoglobin around baseline Anemia panel showed iron  34, sats 11 Gave 1 dose of IV iron  on 6/19, start oral iron  supplementation Daily CBC   Diabetes mellitus type 2 Last A1c 6.2 on 06/2023 SSI, Accu-Cheks, Semglee , hypoglycemic protocol   Hyponatremia  Daily BMP   OSA on CPAP Continue CPAP   Anxiety, insomnia  Continue ativan  and trazodone      Single non-displaced rib fracture  Pain-control, incentive spirometry   Class I obesity Lifestyle modification advised    Estimated body mass index is 33.75 kg/m as calculated from the following:  Height as of this encounter: 6' 2 (1.88 m).   Weight as of this encounter: 119.3 kg.     Code Status: Full  Family Communication: Discussed with  daughter on 08/20/2023  Disposition Plan: Status is: Inpatient Remains inpatient appropriate because: Level of care      Consultants: Cardiothoracic surgery Cardiology/HF  Procedures: None  Antimicrobials: Cefepime  DVT prophylaxis: Eliquis    Objective: Vitals:   08/21/23 0616 08/21/23 0727 08/21/23 1126 08/21/23 1601  BP:  (!) 155/74 (!) 142/68 (!) 141/70  Pulse:  84 82 71  Resp: 20 20 (!) 25 17  Temp:  97.9 F (36.6 C) 98.2 F (36.8 C) 97.6 F (36.4 C)  TempSrc:  Oral Oral Oral  SpO2:  97% 95% 99%  Weight: 119.3 kg     Height:        Intake/Output Summary (Last 24 hours) at 08/21/2023 1910 Last data filed at 08/21/2023 1704 Gross per 24 hour  Intake 650 ml  Output 3200 ml  Net -2550 ml   Filed Weights   08/19/23 0500 08/20/23 0500 08/21/23 0616  Weight: 120.1 kg 118 kg 119.3 kg    Exam: General: NAD  Cardiovascular: S1, S2 present Respiratory: Diminished breath sounds bilaterally Abdomen: Soft, nontender, nondistended, bowel sounds present Musculoskeletal: +bilateral pedal edema noted Skin: Noted bruising on right lower extremity thigh around vein harvest sites Psychiatry: Normal mood     Data Reviewed: CBC: Recent Labs  Lab 08/16/23 0328 08/17/23 0255 08/18/23 0401 08/19/23 0354 08/20/23 0414  WBC 9.4 7.7 7.7 8.4 7.9  NEUTROABS  --   --   --  6.4  --   HGB 10.7* 10.5* 10.4* 11.5* 11.1*  HCT 32.1* 31.8* 32.2* 35.4* 34.8*  MCV 89.7 88.1 89.0 87.6 89.2  PLT 513* 500* 465* 525* 465*   Basic Metabolic Panel: Recent Labs  Lab 08/15/23 0647 08/16/23 0328 08/17/23 0255 08/17/23 0945 08/18/23 0401 08/19/23 0354 08/20/23 0414 08/21/23 0259  NA 130*   < > 130*  --  130* 128* 130* 133*  K 3.6   < > 3.4*  --  4.1 3.9 3.7 3.4*  CL 93*   < > 88*  --  90* 86* 89* 90*  CO2 28   < > 32  --  30 32 33* 31  GLUCOSE 141*   < > 106*  --  122* 119* 108* 104*  BUN 10   < > 11  --  13 13 14 17   CREATININE 0.75   < > 0.71  --  0.85 0.94 0.84 0.86   CALCIUM  7.9*   < > 8.2*  --  8.4* 8.3* 8.4* 8.5*  MG 2.1  --   --  2.0  --   --   --  2.1   < > = values in this interval not displayed.   GFR: Estimated Creatinine Clearance: 106.5 mL/min (by C-G formula based on SCr of 0.86 mg/dL). Liver Function Tests: No results for input(s): AST, ALT, ALKPHOS, BILITOT, PROT, ALBUMIN  in the last 168 hours. No results for input(s): LIPASE, AMYLASE in the last 168 hours. No results for input(s): AMMONIA in the last 168 hours. Coagulation Profile: No results for input(s): INR, PROTIME in the last 168 hours. Cardiac Enzymes: No results for input(s): CKTOTAL, CKMB, CKMBINDEX, TROPONINI in the last 168 hours. BNP (last 3 results) No results for input(s): PROBNP in the last 8760 hours. HbA1C: No results for input(s): HGBA1C in the last 72 hours. CBG: Recent Labs  Lab  08/20/23 2340 08/21/23 0330 08/21/23 0726 08/21/23 1128 08/21/23 1623  GLUCAP 116* 101* 103* 134* 141*   Lipid Profile: No results for input(s): CHOL, HDL, LDLCALC, TRIG, CHOLHDL, LDLDIRECT in the last 72 hours. Thyroid Function Tests: No results for input(s): TSH, T4TOTAL, FREET4, T3FREE, THYROIDAB in the last 72 hours. Anemia Panel: No results for input(s): VITAMINB12, FOLATE, FERRITIN, TIBC, IRON , RETICCTPCT in the last 72 hours.  Urine analysis:    Component Value Date/Time   COLORURINE YELLOW 08/08/2023 0916   APPEARANCEUR HAZY (A) 08/08/2023 0916   LABSPEC 1.014 08/08/2023 0916   PHURINE 6.0 08/08/2023 0916   GLUCOSEU NEGATIVE 08/08/2023 0916   HGBUR SMALL (A) 08/08/2023 0916   BILIRUBINUR NEGATIVE 08/08/2023 0916   KETONESUR NEGATIVE 08/08/2023 0916   PROTEINUR NEGATIVE 08/08/2023 0916   UROBILINOGEN 1.0 12/21/2012 1341   NITRITE NEGATIVE 08/08/2023 0916   LEUKOCYTESUR LARGE (A) 08/08/2023 0916   Sepsis Labs: @LABRCNTIP (procalcitonin:4,lacticidven:4)  ) No results found for this or any  previous visit (from the past 240 hours).     Studies: No results found.     Scheduled Meds:  acetaZOLAMIDE  500 mg Oral BID   ALPRAZolam   0.25 mg Oral BID   amiodarone   400 mg Oral BID   apixaban   5 mg Oral BID   aspirin  EC  81 mg Oral Daily   azelastine   1 spray Each Nare BID   carvedilol   6.25 mg Oral BID   empagliflozin   10 mg Oral Daily   feeding supplement  237 mL Oral BID BM   ferrous gluconate   324 mg Oral Q breakfast   fluticasone   2 spray Each Nare BID   furosemide   80 mg Intravenous TID   insulin  aspart  0-6 Units Subcutaneous Q4H   insulin  glargine-yfgn  20 Units Subcutaneous Daily   olopatadine   1 drop Both Eyes Q12H   polyethylene glycol  34 g Oral QHS   potassium chloride   40 mEq Oral BID   senna-docusate  1 tablet Oral QHS   sodium chloride  flush  3 mL Intravenous Q12H   spironolactone   25 mg Oral Daily   terazosin   2 mg Oral QHS   Tiotropium Bromide  Monohydrate  2 puff Inhalation Daily   traZODone   100 mg Oral QHS    Continuous Infusions:  ceFEPime (MAXIPIME) IV 2 g (08/21/23 1603)      LOS: 6 days     Alan JINNY Cage, MD [facility]  If 7PM-7AM, please contact night-coverage [email or web address] 08/21/2023, 7:10 PM

## 2023-08-21 NOTE — Progress Notes (Signed)
 Mobility Specialist Progress Note:    08/21/23 1115  Mobility  Activity Ambulated with assistance in hallway  Level of Assistance Minimal assist, patient does 75% or more  Assistive Device Front wheel walker  Distance Ambulated (ft) 180 ft  RUE Weight Bearing Per Provider Order WBAT  LUE Weight Bearing Per Provider Order WBAT  Activity Response Tolerated well  Mobility Referral Yes  Mobility visit 1 Mobility  Mobility Specialist Start Time (ACUTE ONLY) 1020  Mobility Specialist Stop Time (ACUTE ONLY) 1037  Mobility Specialist Time Calculation (min) (ACUTE ONLY) 17 min   Received pt in bed agreeable to mobility. Required MinA to stand, but contact guard during ambulation. C/o discomfort from recent s/p and SOB, SPO2 89% on RA. Returned to room without fault. Pt left in chair w/ RN in room. SPO2 94% on 2L (C-PAP). Personal belongings and call light within reach. All needs met.  Lavanda Pollack Mobility Specialist  Please contact via [employer] or  Rehab Office [phone]

## 2023-08-21 NOTE — Progress Notes (Signed)
 Orthopedic Tech Progress Note Patient Details:  [name] 05/10/1950 [id number]  Patient ID: Alan LELON Abran Mickey., male   DOB: 09/27/1950, 72 y.o.   MRN: [id number] Forgot to note* spoke with RN earlier and agreed that unna boots should be changed on tues/Friday to maximize benefit [name] 08/21/2023, 5:14 PM

## 2023-08-21 NOTE — Progress Notes (Signed)
 1 Day Post-Op Procedure(s) (LRB): TRANSESOPHAGEAL ECHOCARDIOGRAM (N/A) CARDIOVERSION (N/A) Subjective: Frustrated he is back in a flutter  Objective: Vital signs in last 24 hours: Temp:  [97.6 F (36.4 C)-98.8 F (37.1 C)] 97.9 F (36.6 C) (06/24 0727) Pulse Rate:  [75-90] 84 (06/24 0727) Cardiac Rhythm: Normal sinus rhythm (06/23 2109) Resp:  [17-25] 20 (06/24 0727) BP: (106-155)/(57-77) 155/74 (06/24 0727) SpO2:  [96 %-100 %] 97 % (06/24 0727) Weight:  [119.3 kg] 119.3 kg (06/24 0616)  Hemodynamic parameters for last 24 hours:    Intake/Output from previous day: 06/23 0701 - 06/24 0700 In: 650 [P.O.:450; IV Piggyback:200] Out: 2600 [Urine:2600] Intake/Output this shift: No intake/output data recorded.  General appearance: alert, cooperative, and no distress Neurologic: intact Heart: regular rate and rhythm Lungs: diminished breath sounds bibasilar and L>R Extremities: edema improved  Lab Results: Recent Labs    08/19/23 0354 08/20/23 0414  WBC 8.4 7.9  HGB 11.5* 11.1*  HCT 35.4* 34.8*  PLT 525* 465*   BMET:  Recent Labs    08/20/23 0414 08/21/23 0259  NA 130* 133*  K 3.7 3.4*  CL 89* 90*  CO2 33* 31  GLUCOSE 108* 104*  BUN 14 17  CREATININE 0.84 0.86  CALCIUM  8.4* 8.5*    PT/INR: No results for input(s): LABPROT, INR in the last 72 hours. ABG    Component Value Date/Time   PHART 7.330 (L) 07/31/2023 0134   HCO3 24.8 07/31/2023 0134   TCO2 26 07/31/2023 0134   ACIDBASEDEF 1.0 07/31/2023 0134   O2SAT 97 07/31/2023 0134   CBG (last 3)  Recent Labs    08/20/23 2340 08/21/23 0330 08/21/23 0726  GLUCAP 116* 101* 103*    Assessment/Plan: S/P Procedure(s) (LRB): TRANSESOPHAGEAL ECHOCARDIOGRAM (N/A) CARDIOVERSION (N/A) - Went back into Atrial flutter overnight Still using CPAP Edema improved Continuing to diurese Will check repeat CXR in AM- may benefit from thoracentesis if there is significant fluid present   LOS: 6 days     Elspeth JAYSON Millers 08/21/2023

## 2023-08-21 NOTE — Progress Notes (Addendum)
 Advanced Heart Failure Rounding Note  Cardiologist: None  HF Cardiologist: Dr. Cherrie Chief Complaint: SOB Subjective:    NSR overnight with frequent PACs, rate in 80s 2.6L UOP (net - 1.9L), although weight up ~3lbs.   Lying in bed. Remains with intermittent SOB requiring CPAP for comfort. No appetite. No CP or palpitations. Using IS, ambulating in hall with PT. Plans to get up to the chair soon.   Objective:    Vital Signs:   Temp:  [97.6 F (36.4 C)-98.8 F (37.1 C)] 97.9 F (36.6 C) (06/24 0727) Pulse Rate:  [75-91] 84 (06/24 0727) Resp:  [17-25] 20 (06/24 0727) BP: (106-155)/(57-77) 155/74 (06/24 0727) SpO2:  [96 %-100 %] 97 % (06/24 0727) Weight:  [119.3 kg] 119.3 kg (06/24 0616) Last BM Date : 08/17/23  Weight change: Filed Weights   08/19/23 0500 08/20/23 0500 08/21/23 0616  Weight: 120.1 kg 118 kg 119.3 kg   Intake/Output:  Intake/Output Summary (Last 24 hours) at 08/21/2023 0751 Last data filed at 08/21/2023 0600 Gross per 24 hour  Intake 650 ml  Output 2600 ml  Net -1950 ml    Physical Exam   General: Weak appearing. No distress on RA Cardiac: JVP difficult to assess. S1 and S2 present. No murmurs or rub. Resp: Lung sounds coarse throughout Abdomen: Taut, non-tender, distended.  Extremities: Warm and dry.  2+ pitting edema.  Neuro: Alert and oriented x3. Affect pleasant. Moves all extremities without difficulty.  Telemetry   SR overnight with rates in 70-80s, frequent PACs (personally reviewed)  EKG    EKG ordered.   Labs    CBC Recent Labs    08/19/23 0354 08/20/23 0414  WBC 8.4 7.9  NEUTROABS 6.4  --   HGB 11.5* 11.1*  HCT 35.4* 34.8*  MCV 87.6 89.2  PLT 525* 465*   Basic Metabolic Panel Recent Labs    93/76/74 0414 08/21/23 0259  NA 130* 133*  K 3.7 3.4*  CL 89* 90*  CO2 33* 31  GLUCOSE 108* 104*  BUN 14 17  CREATININE 0.84 0.86  CALCIUM  8.4* 8.5*   BNP (last 3 results) Recent Labs    07/24/23 1224  08/15/23 0006  BNP 243.3* 488.7*   Medications:    Scheduled Medications:  acetaZOLAMIDE  500 mg Oral BID   ALPRAZolam   0.25 mg Oral BID   amiodarone   400 mg Oral BID   apixaban   5 mg Oral BID   aspirin  EC  81 mg Oral Daily   azelastine   1 spray Each Nare BID   carvedilol   3.125 mg Oral BID   empagliflozin   10 mg Oral Daily   feeding supplement  237 mL Oral BID BM   ferrous gluconate   324 mg Oral Q breakfast   fluticasone   2 spray Each Nare BID   furosemide   80 mg Intravenous BID   insulin  aspart  0-6 Units Subcutaneous Q4H   insulin  glargine-yfgn  20 Units Subcutaneous Daily   olopatadine   1 drop Both Eyes Q12H   polyethylene glycol  34 g Oral QHS   potassium chloride   40 mEq Oral Once   senna-docusate  1 tablet Oral QHS   sodium chloride  flush  3 mL Intravenous Q12H   spironolactone   12.5 mg Oral Daily   terazosin   2 mg Oral QHS   Tiotropium Bromide  Monohydrate  2 puff Inhalation Daily   traZODone   100 mg Oral QHS   Infusions:  ceFEPime (MAXIPIME) IV 2 g (08/21/23 0037)  PRN Medications: acetaminophen  **OR** acetaminophen , hydrOXYzine , traMADol , trimethobenzamide  Patient Profile   [name]. is a 73 y.o. male with a hx of PAF on eliquis , HFpEF, alcohol use, cannabis use, hx of tobacco use, HLD with statin intolerance, COPD, DM2, CKD2, OSA on CPAP, obesity, remote hx of ICH, provoked DVT. Admitted with A/D HFpEF 2/2 atrial flutter.   Assessment/Plan   Acute on chronic diastolic heart failure, LVEF 50-55% - per notes, pt was taking daily 20 mg lasix , 25 mg coreg  BID, 97/103 mg entresto  BID, and 12.5 mg jardiance  outpatient prior to CABG (primarily follows at the [facility], last cards appt 06/29/23) - echo this admission with LVEF 50-55% - Remains significantly volume up. Increase lasix  to 80 mg to TID dosing + diamox 500 mg BID today - started on entresto  after CABG, stopped for hypotension. Would not restart with angioedema to ACE and cough to losartan - Increase  coreg  6.25 mg bid - Increase spirolactone to 25 mg daily. Replete K. - continue Jardiance  10 mg today - Place UNNA boots   CABG x 3 07/30/23 - no chest pain, expected chest soreness - continue ASA, no plavix given eliquis    PAF Atrial flutter with CVR - eliquis  was resumed on 08/02/23 after CABG - Admit EKG with atrial flutter with variable block - Suspect flutter is contributing to SOB - s/p TEE/DCCV 6/23 > NSR. Frequent PACs - Obtain 12 lead EKG  - Increase coreg  6.25 mg bid - Continue amio 400 mg bid - Continue eliquis  5 mg bid   Chronic anticoagulation - on ASA post CABG in the setting of ACS presentation - on eliquis  without bleeding - restarted on 08/02/23 - PLT trending down to 500   Hyperlipidemia with LDL goal < 55 - Consider lower LDL goal given smoking history - 5/25: Cholesterol 214; HDL 44; LDL Cholesterol 154; Triglycerides 79; VLDL 16 - He is statin intolerant and reported oral swelling with zetia - will need PCSK9i OP - [facility]   Prior DVT - 2006 Prior subdural hematoma - 2010 - has done well on eliquis   HTN - increase spiro and coreg   ID: - started on IV cefepime for CAP coverage - afebrile; no elevated WBC  Length of Stay: 6  [place] Carlicia Leavens, NP  08/21/2023, 7:51 AM  Advanced Heart Failure Team Pager [phone] (M-F; 7a - 5p)  Please contact [facility] for night-coverage after hours (5p -7a ) and weekends on [email or web address]

## 2023-08-21 NOTE — TOC Progression Note (Signed)
 Transition of Care (TOC) - Progression Note    Patient Details  Name: [name]. MRN: [id number] Date of Birth: 1951/01/04  Transition of Care [facility]) CM/SW Contact  Arlana JINNY Nicholaus [place] Phone Number: [phone] 08/21/2023, 1:49 PM  Clinical Narrative:   HF CSW contacted Randine from [facility] to confirm that the patient can return once medically ready. Patients EDD is is TBD.   TOC will continue following.          Expected Discharge Plan and Services                                               Social Determinants of Health (SDOH) Interventions SDOH Screenings   Food Insecurity: No Food Insecurity (08/15/2023)  Housing: Low Risk  (08/15/2023)  Transportation Needs: No Transportation Needs (08/15/2023)  Utilities: Not At Risk (08/15/2023)  Social Connections: Moderately Integrated (08/15/2023)  Tobacco Use: Medium Risk (08/20/2023)    Readmission Risk Interventions     No data to display

## 2023-08-21 NOTE — Progress Notes (Signed)
 Orthopedic Tech Progress Note Patient Details:  [name]. 11/30/1950 [id number]  Ortho Devices Type of Ortho Device: Nonie boot Ortho Device/Splint Location: Bilateral LE Ortho Device/Splint Interventions: Ordered, Application, Adjustment   Post Interventions Patient Tolerated: Well Instructions Provided: Adjustment of device  [name] 08/21/2023, 5:12 PM

## 2023-08-21 NOTE — Progress Notes (Signed)
 Occupational Therapy Treatment Patient Details Name: [name]. MRN: [id number] DOB: 1950-03-02 Today's Date: 08/21/2023   History of present illness [name]. is a 73 y.o. male admitted 08/14/23 for CHF exacerbation. PMHx: HTN, T2DM, chronic diastolic CHF, PAF on Eliquis , obesity, OSA, and CAD s/p CABG x3 on 07/30/2023.   OT comments  Pt making good progress towards OT goals though remains limited by strength and endurance deficits. Pt continues to require assistance for bed mobility and LB ADLs but making good progress with sit to stand transfers and in-room mobility using RW. Pt eager to complete inpatient rehab stay to maximize independence in order to return home.   SpO2 99% on RA at rest, 91% post activity on RA      If plan is discharge home, recommend the following:  A little help with walking and/or transfers;A lot of help with bathing/dressing/bathroom;Help with stairs or ramp for entrance   Equipment Recommendations  Other (comment) (TBD)    Recommendations for Other Services      Precautions / Restrictions Precautions Precautions: Sternal;Fall Precaution/Restrictions Comments: monitor O2 Restrictions Weight Bearing Restrictions Per Provider Order: No       Mobility Bed Mobility Overal bed mobility: Needs Assistance Bed Mobility: Supine to Sit, Sit to Supine     Supine to sit: Min assist, HOB elevated Sit to supine: Min assist   General bed mobility comments: Requested HOB elevated (reports bed will do this at home), able to swing LE to EOB but difficulty bringing trunk forward to EOB requiring assist. Min A for BLE back to bed    Transfers Overall transfer level: Needs assistance Equipment used: Rolling walker (2 wheels) Transfers: Sit to/from Stand Sit to Stand: Contact guard assist, From elevated surface           General transfer comment: elevated bed to simulate bed at home with pt able to stand successfully on second attempt      Balance Overall balance assessment: Needs assistance Sitting-balance support: Feet supported Sitting balance-Leahy Scale: Good     Standing balance support: Bilateral upper extremity supported, During functional activity, Reliant on assistive device for balance Standing balance-Leahy Scale: Fair                             ADL either performed or assessed with clinical judgement   ADL Overall ADL's : Needs assistance/impaired     Grooming: Supervision/safety;Standing;Oral care               Lower Body Dressing: Minimal assistance;Sitting/lateral leans;Sit to/from stand Lower Body Dressing Details (indicate cue type and reason): simulated LB dressing. pt able to cross LE with effort to reach socks. able to reach down to ankles. discussed techniques for donning pants with pt verbalizing understanding, reports wearing slip on shoes at baseline             Functional mobility during ADLs: Contact guard assist;Rolling walker (2 wheels) General ADL Comments: Discussed techniques for bed mobility, ADL modifications, O2 monitoring and considerations for toileting hygiene    Extremity/Trunk Assessment Upper Extremity Assessment Upper Extremity Assessment: Overall WFL for tasks assessed;Right hand dominant   Lower Extremity Assessment Lower Extremity Assessment: Defer to PT evaluation        Vision   Vision Assessment?: No apparent visual deficits   Perception     Praxis     Communication Communication Communication: No apparent difficulties   Cognition Arousal: Alert Behavior  During Therapy: WFL for tasks assessed/performed Cognition: No apparent impairments                               Following commands: Intact        Cueing   Cueing Techniques: Verbal cues, Gestural cues, Visual cues  Exercises      Shoulder Instructions       General Comments SpO2 99% on RA at rest, 91% on RA post activity    Pertinent Vitals/ Pain        Pain Assessment Pain Assessment: Faces Faces Pain Scale: Hurts a little bit Pain Location: chest, sternal area Pain Descriptors / Indicators: Guarding, Grimacing Pain Intervention(s): Monitored during session  Home Living                                          Prior Functioning/Environment              Frequency  Min 2X/week        Progress Toward Goals  OT Goals(current goals can now be found in the care plan section)  Progress towards OT goals: Progressing toward goals  Acute Rehab OT Goals Patient Stated Goal: be how i was before the heart attack OT Goal Formulation: With patient Time For Goal Achievement: 08/31/23 Potential to Achieve Goals: Good ADL Goals Pt Will Perform Grooming: with supervision;standing Pt Will Perform Upper Body Dressing: with set-up;sitting Pt Will Perform Lower Body Dressing: with supervision;sit to/from stand Pt Will Transfer to Toilet: with supervision;ambulating;regular height toilet Pt Will Perform Toileting - Clothing Manipulation and hygiene: with supervision;sit to/from stand Additional ADL Goal #1: Pt will generalize sternal precautions in mobility and ADLs. Additional ADL Goal #2: Pt will generalize energy conservation strategies during ADLs and mobililty.  Plan      Co-evaluation                 AM-PAC OT 6 Clicks Daily Activity     Outcome Measure   Help from another person eating meals?: None Help from another person taking care of personal grooming?: A Little Help from another person toileting, which includes using toliet, bedpan, or urinal?: A Lot Help from another person bathing (including washing, rinsing, drying)?: A Lot Help from another person to put on and taking off regular upper body clothing?: A Little Help from another person to put on and taking off regular lower body clothing?: A Lot 6 Click Score: 16    End of Session Equipment Utilized During Treatment: Rolling walker (2  wheels)  OT Visit Diagnosis: Unsteadiness on feet (R26.81);Other abnormalities of gait and mobility (R26.89);Pain;Muscle weakness (generalized) (M62.81);Other (comment)   Activity Tolerance Patient tolerated treatment well   Patient Left in bed;with call bell/phone within reach   Nurse Communication          Time: 0812-0843 OT Time Calculation (min): 31 min  Charges: OT General Charges $OT Visit: 1 Visit OT Treatments $Self Care/Home Management : 8-22 mins $Therapeutic Activity: 8-22 mins  Mliss NOVAK, OTR/L Acute Rehab Services Office: [phone]   Mliss Fish 08/21/2023, 10:54 AM

## 2023-08-22 ENCOUNTER — Inpatient Hospital Stay (HOSPITAL_COMMUNITY)

## 2023-08-22 DIAGNOSIS — I10 Essential (primary) hypertension: Secondary | ICD-10-CM

## 2023-08-22 DIAGNOSIS — I480 Paroxysmal atrial fibrillation: Secondary | ICD-10-CM | POA: Diagnosis not present

## 2023-08-22 DIAGNOSIS — I5033 Acute on chronic diastolic (congestive) heart failure: Secondary | ICD-10-CM | POA: Diagnosis not present

## 2023-08-22 DIAGNOSIS — I2510 Atherosclerotic heart disease of native coronary artery without angina pectoris: Secondary | ICD-10-CM | POA: Diagnosis not present

## 2023-08-22 HISTORY — PX: IR THORACENTESIS RIGHT ASP PLEURAL SPACE W/IMG GUIDE: IMG5380

## 2023-08-22 LAB — CBC WITH DIFFERENTIAL/PLATELET
Abs Immature Granulocytes: 0.05 10*3/uL (ref 0.00–0.07)
Basophils Absolute: 0.1 10*3/uL (ref 0.0–0.1)
Basophils Relative: 1 %
Eosinophils Absolute: 0.3 10*3/uL (ref 0.0–0.5)
Eosinophils Relative: 3 %
HCT: 34.7 % — ABNORMAL LOW (ref 39.0–52.0)
Hemoglobin: 11.4 g/dL — ABNORMAL LOW (ref 13.0–17.0)
Immature Granulocytes: 1 %
Lymphocytes Relative: 11 %
Lymphs Abs: 0.9 10*3/uL (ref 0.7–4.0)
MCH: 29.1 pg (ref 26.0–34.0)
MCHC: 32.9 g/dL (ref 30.0–36.0)
MCV: 88.5 fL (ref 80.0–100.0)
Monocytes Absolute: 0.8 10*3/uL (ref 0.1–1.0)
Monocytes Relative: 10 %
Neutro Abs: 6.1 10*3/uL (ref 1.7–7.7)
Neutrophils Relative %: 74 %
Platelets: 468 10*3/uL — ABNORMAL HIGH (ref 150–400)
RBC: 3.92 MIL/uL — ABNORMAL LOW (ref 4.22–5.81)
RDW: 14 % (ref 11.5–15.5)
WBC: 8.2 10*3/uL (ref 4.0–10.5)
nRBC: 0 % (ref 0.0–0.2)

## 2023-08-22 LAB — GLUCOSE, CAPILLARY
Glucose-Capillary: 152 mg/dL — ABNORMAL HIGH (ref 70–99)
Glucose-Capillary: 135 mg/dL — ABNORMAL HIGH (ref 70–99)
Glucose-Capillary: 132 mg/dL — ABNORMAL HIGH (ref 70–99)
Glucose-Capillary: 148 mg/dL — ABNORMAL HIGH (ref 70–99)
Glucose-Capillary: 173 mg/dL — ABNORMAL HIGH (ref 70–99)

## 2023-08-22 LAB — BASIC METABOLIC PANEL WITH GFR
Anion gap: 9 (ref 5–15)
BUN: 17 mg/dL (ref 8–23)
CO2: 29 mmol/L (ref 22–32)
Calcium: 8.4 mg/dL — ABNORMAL LOW (ref 8.9–10.3)
Chloride: 93 mmol/L — ABNORMAL LOW (ref 98–111)
Creatinine, Ser: 0.86 mg/dL (ref 0.61–1.24)
GFR, Estimated: >60 mL/min (ref >60)
Glucose, Bld: 119 mg/dL — ABNORMAL HIGH (ref 70–99)
Potassium: 3.7 mmol/L (ref 3.5–5.1)
Sodium: 131 mmol/L — ABNORMAL LOW (ref 135–145)

## 2023-08-22 LAB — MAGNESIUM: Magnesium: 2.2 mg/dL (ref 1.7–2.4)

## 2023-08-22 MED ORDER — LIDOCAINE HCL 1 % IJ SOLN
INTRAMUSCULAR | Status: AC
  Filled 2023-08-22: qty 20

## 2023-08-22 MED ORDER — ACETAZOLAMIDE 250 MG PO TABS
500.0000 mg | ORAL_TABLET | Freq: Two times a day (BID) | ORAL | Status: AC
  Administered 2023-08-22 (×2): 500 mg via ORAL
  Filled 2023-08-22 (×2): qty 2

## 2023-08-22 MED ORDER — LIDOCAINE HCL 1 % IJ SOLN
20.0000 mL | Freq: Once | INTRAMUSCULAR | Status: AC
  Administered 2023-08-22: 10 mL

## 2023-08-22 MED ORDER — INSULIN ASPART 100 UNIT/ML IJ SOLN
0.0000 [IU] | Freq: Three times a day (TID) | INTRAMUSCULAR | Status: DC
  Administered 2023-08-23 – 2023-08-24 (×3): 1 [IU] via SUBCUTANEOUS
  Administered 2023-08-25: 2 [IU] via SUBCUTANEOUS
  Administered 2023-08-25: 1 [IU] via SUBCUTANEOUS
  Administered 2023-08-25 – 2023-08-26 (×2): 3 [IU] via SUBCUTANEOUS
  Administered 2023-08-26: 1 [IU] via SUBCUTANEOUS
  Administered 2023-08-26: 2 [IU] via SUBCUTANEOUS
  Administered 2023-08-27: 1 [IU] via SUBCUTANEOUS

## 2023-08-22 MED ORDER — AMLODIPINE BESYLATE 5 MG PO TABS
5.0000 mg | ORAL_TABLET | Freq: Every day | ORAL | Status: DC
  Administered 2023-08-22 – 2023-08-25 (×4): 5 mg via ORAL
  Filled 2023-08-22 (×4): qty 1

## 2023-08-22 MED ORDER — POTASSIUM CHLORIDE CRYS ER 20 MEQ PO TBCR
60.0000 meq | EXTENDED_RELEASE_TABLET | ORAL | Status: AC
  Administered 2023-08-22 (×2): 60 meq via ORAL
  Filled 2023-08-22 (×2): qty 3

## 2023-08-22 MED ORDER — FUROSEMIDE 10 MG/ML IJ SOLN
80.0000 mg | Freq: Three times a day (TID) | INTRAMUSCULAR | Status: AC
  Administered 2023-08-22 (×3): 80 mg via INTRAVENOUS
  Filled 2023-08-22 (×3): qty 8

## 2023-08-22 NOTE — Procedures (Signed)
 PROCEDURE SUMMARY:  Successful US  guided left thoracentesis. Yielded 1.3 of dark amber fluid. Patient tolerated procedure well. No immediate complications. EBL = trace  Specimen sent for labs.  Post procedure chest X-ray reveals no pneumothorax  Clancey Welton CHRISTELLA Bal PA-C 08/22/2023 11:05 AM

## 2023-08-22 NOTE — Assessment & Plan Note (Addendum)
 Patient was placed on insulin  sliding scale and basal insulin  for glucose cover and monitoring  He developed uncontrolled hyperglycemia due to steroids

## 2023-08-22 NOTE — Assessment & Plan Note (Addendum)
 Recent CABG 05/27 to 08/10/23  No chest pain continue blood pressure control.

## 2023-08-22 NOTE — Assessment & Plan Note (Addendum)
 Sp direct current cardioversion 08/20/23  Continue rate control with amiodarone , will continue oral load with 200 mg bid for 2 weeks and then transition to 200 mg daily.  Anticoagulation with apixaban 

## 2023-08-22 NOTE — Progress Notes (Addendum)
      [address]       [name] [address]             [phone]      2 Days Post-Op Procedure(s) (LRB): TRANSESOPHAGEAL ECHOCARDIOGRAM (N/A) CARDIOVERSION (N/A) Subjective: Eating breakfast. Has been on RA for past hour with O2 sat 94% currentlyl.  He remains comfortable at rest but gets short of breath with minimal activity.    Net 18L diuresis since admission.   Objective: Vital signs in last 24 hours: Temp:  [97.6 F (36.4 C)-98.2 F (36.8 C)] 98.2 F (36.8 C) (06/25 0627) Pulse Rate:  [70-82] 70 (06/25 0627) Cardiac Rhythm: Normal sinus rhythm (06/24 2208) Resp:  [17-25] 20 (06/25 0627) BP: (122-153)/(60-70) 122/68 (06/25 0627) SpO2:  [93 %-99 %] 94 % (06/25 0627) Weight:  [117.1 kg] (P) 117.1 kg (06/25 0627)    Intake/Output from previous day: 06/24 0701 - 06/25 0700 In: -  Out: 4400 [Urine:4400] Intake/Output this shift: No intake/output data recorded.  General appearance: alert, cooperative, and no distress Neurologic: intact Heart: appears to be holding SR  Lungs: breath sounds clear, shallow. Normal work of breathing on RA this morning.  Abdomen: obese, soft, no tenderness Extremities: bruising right thigh unchanged, no palpable hematoma. Now has Unna boots in place Wound: the sternotomy incision is healing well.   Lab Results: Recent Labs    08/20/23 0414 08/22/23 0323  WBC 7.9 8.2  HGB 11.1* 11.4*  HCT 34.8* 34.7*  PLT 465* 468*   BMET:  Recent Labs    08/21/23 0259 08/22/23 0323  NA 133* 131*  K 3.4* 3.7  CL 90* 93*  CO2 31 29  GLUCOSE 104* 119*  BUN 17 17  CREATININE 0.86 0.86  CALCIUM  8.5* 8.4*    PT/INR: No results for input(s): LABPROT, INR in the last 72 hours. ABG    Component Value Date/Time   PHART 7.330 (L) 07/31/2023 0134   HCO3 24.8 07/31/2023 0134   TCO2 26 07/31/2023 0134   ACIDBASEDEF 1.0 07/31/2023 0134   O2SAT 97 07/31/2023 0134   CBG (last 3)  Recent Labs    08/21/23 2011  08/21/23 2337 08/22/23 0623  GLUCAP 179* 144* 152*    Assessment/Plan: S/P Procedure(s) (LRB): TRANSESOPHAGEAL ECHOCARDIOGRAM (N/A) CARDIOVERSION (N/A)  -Hospital day 7 re-admission post CABG with progressive shortness of breath, acute on chronic diastolic HF, a-flutter, and volume excess. Appreciate mgt per hospitalists and cardiology. Continues to have brisk response to diuresis.  -A-Flutter: appears to be in SR after cardioversion on 6/23. On oral amiodarone  and abixaban.  -Left Pleural effusion: appears larger and more defined on follow up CXR this morning. Will request ultrasound-guided left thoracentesis by IR today.   -ID: has worsening density left base concerning for HCAP.  On day 3 IV cefepime. Not febrile and has normal WBC.  Continue to encourage work on pulmonary hygiene and mobility.     LOS: 7 days    Myron G. Roddenberry, PA-C 08/22/2023 Patient seen and examined, agree with above Will have IR try to do left thoracentesis May benefit from a prednisone taper as left effusion likely inflammatory due to recent CABG. Will reassess tomorrow  Elspeth C. Kerrin, MD [facility] [phone]

## 2023-08-22 NOTE — Assessment & Plan Note (Signed)
 No signs of acute exacerbation,  Continue oxymetry monitoring

## 2023-08-22 NOTE — TOC Progression Note (Signed)
 Transition of Care (TOC) - Progression Note    Patient Details  Name: [name]. MRN: [id number] Date of Birth: [birth year]  Transition of Care [facility]) CM/SW Contact  Arlana JINNY Nicholaus [place] Phone Number: [phone] 08/22/2023, 11:15 AM  Clinical Narrative:   10:49 AM- HF CSW attempted to meet with patient at bedside. Patient was not in the room. CSW will continue to make attempts to follow up with patient.   TOC will continue following.          Expected Discharge Plan and Services                                               Social Determinants of Health (SDOH) Interventions SDOH Screenings   Food Insecurity: No Food Insecurity (08/15/2023)  Housing: Low Risk  (08/15/2023)  Transportation Needs: No Transportation Needs (08/15/2023)  Utilities: Not At Risk (08/15/2023)  Social Connections: Moderately Integrated (08/15/2023)  Tobacco Use: Medium Risk (08/20/2023)    Readmission Risk Interventions     No data to display

## 2023-08-22 NOTE — Progress Notes (Signed)
 Mobility Specialist Progress Note:    08/22/23 1434  Mobility  Activity Ambulated with assistance to bathroom;Ambulated with assistance in room  Level of Assistance Standby assist, set-up cues, supervision of patient - no hands on  Assistive Device Front wheel walker  Distance Ambulated (ft) 220 ft  RUE Weight Bearing Per Provider Order WBAT  LUE Weight Bearing Per Provider Order WBAT  Activity Response Tolerated well  Mobility Referral Yes  Mobility visit 1 Mobility  Mobility Specialist Start Time (ACUTE ONLY) 1420  Mobility Specialist Stop Time (ACUTE ONLY) 1434  Mobility Specialist Time Calculation (min) (ACUTE ONLY) 14 min   Pt received in chair, agreeable to mobility session. Ambulated in hallway with RW and SV. Tolerated well. See sats below. Returned pt to room, sitting up in chair with all needs met.  Pre Mobility: Sitting: SpO2 94% on RA Standing, SpO2 84% on RA, reported dizziness. Recovered, SpO2 96% on RA Post Mobility: SpO2 96% on RA.  Contessa Preuss Mobility Specialist Please contact via Special educational needs teacher or  Rehab office at [phone]   Darvell Monteforte Mobility Specialist Please contact via SecureChat or  Rehab office at [phone]

## 2023-08-22 NOTE — Progress Notes (Signed)
 Progress Note   Patient: [name]. FMW:[id number] DOB: 1950/08/08 DOA: 08/14/2023     7 DOS: the patient was seen and examined on 08/22/2023   Brief hospital course: Mr.,[name] was admitted to the hospital with the working diagnosis of heart failure decompensation   73 y.o. 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   Pt was told to hold his lasix  2/2 hyponatremia prior to d/c to SNF. Pt was placed on CPAP with EMS.  Upon arrival to the ED, patient is found to be afebrile, on CPAP with tachypnea, elevated BP.   Labs are most notable for sodium 128, troponin 83, and BNP 489.   CTA chest is negative for PE but notable for nondisplaced right posterior first rib fracture and small bilateral pleural effusions with partial loculation on the left.   Assessment and Plan: * Acute on chronic diastolic CHF (congestive heart failure) (HCC) Echocardiogram with preserved LV systolic function with EF 50 to 55%, mild LVH, moderate hypokinesis of the left ventricular mid apical septal wall and apical segment. RV not well visualized, LA with mild dilatation, no significant valvular disease.   Urine output 4,400 cc Systolic blood pressure 120 mmHg.   Plan to continue diuresis with furosemide  80 mg tid Continue carvedilol , SGLT 2 inh, and spironolactone   Acetazolamide 500 mg bid.   Acute cardiogenic pulmonary edema with left pleural effusion.  Left pleural effusion, sp thoracentesis 1.3 L  Chest radiograph with improvement in left pleural effusion, no fever and no leukocytosis, will dc antibiotic therapy and have close follow up.   Coronary artery disease involving native coronary artery of native heart without angina pectoris Recent CABG 05/27 to 08/10/23   No chest pain continue blood pressure control.   Essential hypertension, benign Continue blood pressure control with amlodipine , carvedilol  and spironolactone    Paroxysmal atrial fibrillation (HCC) Continue rate control with amiodarone   Anticoagulation with apixaban    Hyponatremia Renal function stable with serum cr at 0,86 with K at 3,7 and serum bicarbonate at 29 Na 131, Mg 2,2   Plan to add Kcl to avoid hypokalemia.  Continue diuresis with furosemide , spironolactone , acetazolamide and SGLT 2 inh   COPD (chronic obstructive pulmonary disease) (HCC) No signs of acute exacerbation,  Continue oxymetry monitoring   Sleep apnea Cpap   Type 2 diabetes mellitus with hyperglycemia, with long-term current use of insulin  (HCC) Continue glucose cover and monitoring with insulin  sliding scale  Basal insulin  20 units.  Fasting glucose today 119 mg/dl   Generalized anxiety disorder Continue with alprazolam .         Subjective: Patient with improvement in dyspnea and edema, no chest pain, he has been ambulating with assistance/   Physical Exam: Vitals:   08/22/23 1040 08/22/23 1049 08/22/23 1227 08/22/23 1541  BP: 122/64 (!) 128/49 (!) 127/59 124/65  Pulse:   73 67  Resp:   16 20  Temp:   97.8 F (36.6 C) 97.6 F (36.4 C)  TempSrc:   Axillary Axillary  SpO2:   92% 95%  Weight:      Height:       Neurology awake and alert ENT with mild pallor Cardiovascular with S1 and S2 present and regular with no gallops or rubs, positive systolic murmur at the  apex No JVD No lower extremity edema Respiratory with no wheezing or rhonchi, mild rale at the dependent zones Abdomen with no distention  Data Reviewed:    Family Communication: no family at the bedside   Disposition: Status is: Inpatient Remains inpatient appropriate because: recovering heart failure   Planned Discharge Destination: Home     Author: Elidia Toribio Furnace, MD 08/22/2023 6:55 PM  For on call  review [email or web address].

## 2023-08-22 NOTE — Assessment & Plan Note (Addendum)
 Continue blood pressure control with amlodipine , carvedilol  and spironolactone 

## 2023-08-22 NOTE — Hospital Course (Addendum)
 Mr.,Stierwalt was admitted to the hospital with the working diagnosis of heart failure decompensation   73 y.o. male with medical history significant for HTN, DM2, heart failure, paroxysmal atrial fibrillation. obesity, OSA, and CAD s/p CABG who presents from his SNF for evaluation of worsening dyspnea and lower extremity edema over the past several days,  Recent hospitalization 05/27 to 08/10/23, for NSTEMI that required revascularization with CABG. He was discharge to SNF with instruction to  hold entresto  and resume furosemide  on 08/13/23.  On the day of admission EMS was called to SNF due to patient having respiratory distress. He was placed on Cpap and transported to the ED.  On his initial physical examination his blood pressure was 162/85, HR 82, RR 19 and 25 and 02 saturation 94% on Cpap and supplemental 02.   Na 128, K 4,1 Cl 90, bicarbonate 27 glucose 207 bun 13 cr 0.73  BNP 488 High sensitive troponin 83, 82 and 80  Wbc 10,5 hgb 11.0 plt 559   Chest radiograph with cardiomegaly, bilateral hilar vascular congestion, bilateral central interstitial infiltrates. Small left pleural effusion, sternotomy wires in place,   EKG 89 bpm, left axis deviation, left bundle branch block, qtc 503 atrial fibrillation rhythm with no significant ST segment or T wave changes.   Ct chest with no evidence of pulmonary embolism. Nondisplaced right posterior 1st rib fracture. No pneumothorax.  Small bilateral pleural effusions, partially loculated on the left.   Placed on IV furosemide  for diuresis 06/23 TEE direct current cardioversion, converting back to sinus rhythm.  Placed on amiodarone  load.   06/28 patient medically stable, plan to transfer to SNF on Monday.  06/29 continue euvolemic and tolerating well heart failure regimen, plan for transfer to SNF on 08/27/23  Follow up as outpatient.

## 2023-08-22 NOTE — Assessment & Plan Note (Signed)
 Seems calm at admission  Plan Xanax prn

## 2023-08-22 NOTE — Assessment & Plan Note (Addendum)
 Echocardiogram with preserved LV systolic function with EF 50 to 55%, mild LVH, moderate hypokinesis of the left ventricular mid apical septal wall and apical segment. RV not well visualized, LA with mild dilatation, no significant valvular disease.   Urine output 800 cc Systolic blood pressure 120 mmHg.   Diuresis with po furosemide  40 mg po bid.  Continue carvedilol , SGLT 2 inh, and spironolactone   Sp Acetazolamide  500 mg bid x 2 doses   Acute cardiogenic pulmonary edema with left pleural effusion.  Left pleural effusion, sp thoracentesis 1.3 L  Chest radiograph with improvement in left pleural effusion, no fever and no leukocytosis, Continue to hold on antibiotic therapy  On steroids per CT surgery

## 2023-08-22 NOTE — Progress Notes (Signed)
 Advanced Heart Failure Rounding Note  Cardiologist: None  HF Cardiologist: Dr. Cherrie Chief Complaint: SOB Subjective:    Remaining in NSR with improvement in PVCs  Diuretics increased yesterday, -4.4L UOP, (overall net - 18.2L). No weight this morning.   Feels fine this morning. Denies CP/SOB. Plan for L thoracentesis today.   Objective:    Vital Signs:   Temp:  [97.6 F (36.4 C)-98.2 F (36.8 C)] 98.2 F (36.8 C) (06/25 0627) Pulse Rate:  [70-82] 70 (06/25 0627) Resp:  [17-25] 20 (06/25 0627) BP: (122-153)/(60-70) 122/68 (06/25 0627) SpO2:  [93 %-99 %] 94 % (06/25 0627) Weight:  [117.1 kg] (P) 117.1 kg (06/25 0627) Last BM Date : 08/17/23  Weight change: Filed Weights   08/20/23 0500 08/21/23 0616 08/22/23 0627  Weight: 118 kg 119.3 kg (P) 117.1 kg   Intake/Output:  Intake/Output Summary (Last 24 hours) at 08/22/2023 0816 Last data filed at 08/22/2023 0007 Gross per 24 hour  Intake --  Output 4400 ml  Net -4400 ml    Physical Exam  General:  elderly appearing.  No respiratory difficulty Neck: supple. JVD ~9 cm.  Cor: PMI nondisplaced. Regular rate & rhythm. No rubs, gallops or murmurs. Lungs: diminished Extremities: no cyanosis, clubbing, rash, +1 BLE edema. + UNNA boots Neuro: alert & oriented x 3. Moves all 4 extremities w/o difficulty. Affect pleasant.   Telemetry   NSR 70s (personally reviewed)  EKG    EKG ordered.   Labs    CBC Recent Labs    08/20/23 0414 08/22/23 0323  WBC 7.9 8.2  NEUTROABS  --  6.1  HGB 11.1* 11.4*  HCT 34.8* 34.7*  MCV 89.2 88.5  PLT 465* 468*   Basic Metabolic Panel Recent Labs    93/75/74 0259 08/22/23 0323  NA 133* 131*  K 3.4* 3.7  CL 90* 93*  CO2 31 29  GLUCOSE 104* 119*  BUN 17 17  CREATININE 0.86 0.86  CALCIUM  8.5* 8.4*  MG 2.1 2.2   BNP (last 3 results) Recent Labs    07/24/23 1224 08/15/23 0006  BNP 243.3* 488.7*   Medications:    Scheduled Medications:  ALPRAZolam   0.25 mg Oral  BID   amiodarone   400 mg Oral BID   amLODipine   5 mg Oral Daily   apixaban   5 mg Oral BID   aspirin  EC  81 mg Oral Daily   azelastine   1 spray Each Nare BID   carvedilol   6.25 mg Oral BID   empagliflozin   10 mg Oral Daily   feeding supplement  237 mL Oral BID BM   ferrous gluconate   324 mg Oral Q breakfast   fluticasone   2 spray Each Nare BID   insulin  aspart  0-6 Units Subcutaneous Q4H   insulin  glargine-yfgn  20 Units Subcutaneous Daily   olopatadine   1 drop Both Eyes Q12H   polyethylene glycol  34 g Oral QHS   senna-docusate  1 tablet Oral QHS   sodium chloride  flush  3 mL Intravenous Q12H   spironolactone   25 mg Oral Daily   terazosin   2 mg Oral QHS   Tiotropium Bromide  Monohydrate  2 puff Inhalation Daily   traZODone   100 mg Oral QHS   Infusions:  ceFEPime (MAXIPIME) IV 2 g (08/22/23 0700)   PRN Medications: acetaminophen  **OR** acetaminophen , hydrOXYzine , traMADol , trimethobenzamide  Patient Profile   [name]. is a 73 y.o. male with a hx of PAF on eliquis , HFpEF, alcohol use, cannabis  use, hx of tobacco use, HLD with statin intolerance, COPD, DM2, CKD2, OSA on CPAP, obesity, remote hx of ICH, provoked DVT. Admitted with A/D HFpEF 2/2 atrial flutter.   Assessment/Plan   Acute on chronic diastolic heart failure, LVEF 50-55% - per notes, pt was taking daily 20 mg lasix , 25 mg coreg  BID, 97/103 mg entresto  BID, and 12.5 mg jardiance  outpatient prior to CABG (primarily follows at the [facility], last cards appt 06/29/23) - echo this admission with LVEF 50-55% - Remains volume up. Repeat lasix  80 mg TID + diamox 500 mg BID today. Plan for PO diuretics tomorrow.  - started on entresto  after CABG, stopped for hypotension. Would not restart with angioedema to ACE and cough to losartan - Start amlodipine  5 mg daily - Continue coreg  6.25 mg bid - Continue spirolactone 25 mg daily. - Continue Jardiance  10 mg today - Continue UNNA boots   CABG x 3 07/30/23 - no chest pain,  expected chest soreness - continue ASA, no plavix given eliquis    PAF Atrial flutter with CVR - eliquis  was resumed on 08/02/23 after CABG - Admit EKG with atrial flutter with variable block - Suspect flutter is contributing to SOB - s/p TEE/DCCV 6/23 > NSR. PACs improving - Continue coreg  6.25 mg bid - Continue amio 400 mg bid - Continue eliquis  5 mg bid   Chronic anticoagulation - on ASA post CABG in the setting of ACS presentation - on eliquis  without bleeding - restarted on 08/02/23 - PLT trending down   Hyperlipidemia with LDL goal < 55 - Consider lower LDL goal given smoking history - 5/25: Cholesterol 214; HDL 44; LDL Cholesterol 154; Triglycerides 79; VLDL 16 - He is statin intolerant and reported oral swelling with zetia - will need PCSK9i OP - [facility]   Prior DVT - 2006 Prior subdural hematoma - 2010 - has done well on eliquis   HTN - Continue spiro and coreg  - Start amlodipine  5 mg daily  ID: - started on IV cefepime for CAP coverage - afebrile; no elevated WBC  L pleural effusion - Plan for thoracentesis today  Length of Stay: 7  Beckey LITTIE Coe, NP  08/22/2023, 8:16 AM  Advanced Heart Failure Team Pager [phone] (M-F; 7a - 5p)  Please contact [facility] for night-coverage after hours (5p -7a ) and weekends on [email or web address]

## 2023-08-22 NOTE — Assessment & Plan Note (Signed)
 Cpap

## 2023-08-22 NOTE — Assessment & Plan Note (Addendum)
 Renal function stable with serum cr at 1,0 with K at 4,3 and serum bicarbonate at 28  Na 130   Continue diuresis with furosemide , spironolactone  and SGLT 2 inh.

## 2023-08-23 ENCOUNTER — Other Ambulatory Visit: Payer: Self-pay | Admitting: Thoracic Surgery (Cardiothoracic Vascular Surgery)

## 2023-08-23 ENCOUNTER — Inpatient Hospital Stay (HOSPITAL_COMMUNITY)

## 2023-08-23 DIAGNOSIS — I10 Essential (primary) hypertension: Secondary | ICD-10-CM | POA: Diagnosis not present

## 2023-08-23 DIAGNOSIS — Z951 Presence of aortocoronary bypass graft: Secondary | ICD-10-CM

## 2023-08-23 DIAGNOSIS — I2510 Atherosclerotic heart disease of native coronary artery without angina pectoris: Secondary | ICD-10-CM | POA: Diagnosis not present

## 2023-08-23 DIAGNOSIS — I480 Paroxysmal atrial fibrillation: Secondary | ICD-10-CM | POA: Diagnosis not present

## 2023-08-23 DIAGNOSIS — I5033 Acute on chronic diastolic (congestive) heart failure: Secondary | ICD-10-CM | POA: Diagnosis not present

## 2023-08-23 LAB — CBC
HCT: 38 % — ABNORMAL LOW (ref 39.0–52.0)
Hemoglobin: 12.5 g/dL — ABNORMAL LOW (ref 13.0–17.0)
MCH: 29 pg (ref 26.0–34.0)
MCHC: 32.9 g/dL (ref 30.0–36.0)
MCV: 88.2 fL (ref 80.0–100.0)
Platelets: 485 10*3/uL — ABNORMAL HIGH (ref 150–400)
RBC: 4.31 MIL/uL (ref 4.22–5.81)
RDW: 13.8 % (ref 11.5–15.5)
WBC: 8 10*3/uL (ref 4.0–10.5)
nRBC: 0 % (ref 0.0–0.2)

## 2023-08-23 LAB — GLUCOSE, CAPILLARY
Glucose-Capillary: 114 mg/dL — ABNORMAL HIGH (ref 70–99)
Glucose-Capillary: 147 mg/dL — ABNORMAL HIGH (ref 70–99)
Glucose-Capillary: 148 mg/dL — ABNORMAL HIGH (ref 70–99)
Glucose-Capillary: 165 mg/dL — ABNORMAL HIGH (ref 70–99)
Glucose-Capillary: 192 mg/dL — ABNORMAL HIGH (ref 70–99)
Glucose-Capillary: 195 mg/dL — ABNORMAL HIGH (ref 70–99)
Glucose-Capillary: 280 mg/dL — ABNORMAL HIGH (ref 70–99)

## 2023-08-23 LAB — BASIC METABOLIC PANEL WITH GFR
Anion gap: 11 (ref 5–15)
BUN: 19 mg/dL (ref 8–23)
CO2: 30 mmol/L (ref 22–32)
Calcium: 9 mg/dL (ref 8.9–10.3)
Chloride: 92 mmol/L — ABNORMAL LOW (ref 98–111)
Creatinine, Ser: 0.94 mg/dL (ref 0.61–1.24)
GFR, Estimated: >60 mL/min (ref >60)
Glucose, Bld: 121 mg/dL — ABNORMAL HIGH (ref 70–99)
Potassium: 3.8 mmol/L (ref 3.5–5.1)
Sodium: 133 mmol/L — ABNORMAL LOW (ref 135–145)

## 2023-08-23 LAB — MAGNESIUM: Magnesium: 2.2 mg/dL (ref 1.7–2.4)

## 2023-08-23 MED ORDER — PREDNISONE 10 MG (21) PO TBPK
10.0000 mg | ORAL_TABLET | Freq: Four times a day (QID) | ORAL | Status: DC
  Administered 2023-08-25 – 2023-08-27 (×8): 10 mg via ORAL

## 2023-08-23 MED ORDER — PREDNISONE 10 MG (21) PO TBPK
20.0000 mg | ORAL_TABLET | Freq: Every morning | ORAL | Status: AC
  Administered 2023-08-23: 20 mg via ORAL
  Filled 2023-08-23: qty 21

## 2023-08-23 MED ORDER — PREDNISONE 10 MG (21) PO TBPK
10.0000 mg | ORAL_TABLET | Freq: Three times a day (TID) | ORAL | Status: AC
  Administered 2023-08-24 (×3): 10 mg via ORAL

## 2023-08-23 MED ORDER — FUROSEMIDE 40 MG PO TABS
40.0000 mg | ORAL_TABLET | Freq: Every day | ORAL | Status: DC
  Administered 2023-08-23: 40 mg via ORAL
  Filled 2023-08-23: qty 1

## 2023-08-23 MED ORDER — PREDNISONE 10 MG (21) PO TBPK
20.0000 mg | ORAL_TABLET | Freq: Every evening | ORAL | Status: AC
  Administered 2023-08-24: 20 mg via ORAL

## 2023-08-23 MED ORDER — PREDNISONE 10 MG (21) PO TBPK
10.0000 mg | ORAL_TABLET | ORAL | Status: AC
  Administered 2023-08-23: 10 mg via ORAL

## 2023-08-23 MED ORDER — PREDNISONE 10 MG (21) PO TBPK
20.0000 mg | ORAL_TABLET | Freq: Every evening | ORAL | Status: AC
  Administered 2023-08-23: 20 mg via ORAL

## 2023-08-23 MED ORDER — FUROSEMIDE 40 MG PO TABS
40.0000 mg | ORAL_TABLET | Freq: Two times a day (BID) | ORAL | Status: DC
  Administered 2023-08-23 – 2023-08-27 (×8): 40 mg via ORAL
  Filled 2023-08-23 (×8): qty 1

## 2023-08-23 MED ORDER — POTASSIUM CHLORIDE CRYS ER 20 MEQ PO TBCR
40.0000 meq | EXTENDED_RELEASE_TABLET | Freq: Once | ORAL | Status: AC
  Administered 2023-08-23: 40 meq via ORAL
  Filled 2023-08-23: qty 2

## 2023-08-23 NOTE — Progress Notes (Signed)
 Progress Note   Patient: [name]. FMW:[id number] DOB: [birth year] DOA: 08/14/2023     8 DOS: the patient was seen and examined on 08/23/2023   Brief hospital course: Mr.,Petta was admitted to the hospital with the working diagnosis of heart failure decompensation   73 y.o. 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   Pt was told to hold his lasix  2/2 hyponatremia prior to d/c to SNF. Pt was placed on CPAP with EMS.  Upon arrival to the ED, patient is found to be afebrile, on CPAP with tachypnea, elevated BP.   Labs are most notable for sodium 128, troponin 83, and BNP 489.   CTA chest is negative for PE but notable for nondisplaced right posterior first rib fracture and small bilateral pleural effusions with partial loculation on the left.   Assessment and Plan: * Acute on chronic diastolic CHF (congestive heart failure) (HCC) Echocardiogram with preserved LV systolic function with EF 50 to 55%, mild LVH, moderate hypokinesis of the left ventricular mid apical septal wall and apical segment. RV not well visualized, LA with mild dilatation, no significant valvular disease.   Urine output 4,350  cc Systolic blood pressure 120 mmHg.   Transition to po furosemide  40 mg po bid.  Continue carvedilol , SGLT 2 inh, and spironolactone   Sp Acetazolamide 500 mg bid x 2 doses   Acute cardiogenic pulmonary edema with left pleural effusion.  Left pleural effusion, sp thoracentesis 1.3 L  Chest radiograph with improvement in left pleural effusion, no fever and no leukocytosis, Continue to hold on antibiotic therapy  On steroids per CT surgery   Coronary artery disease involving native coronary artery of native heart without angina pectoris Recent  CABG 05/27 to 08/10/23  No chest pain continue blood pressure control.   Essential hypertension, benign Continue blood pressure control with amlodipine , carvedilol  and spironolactone    Paroxysmal atrial fibrillation (HCC) Continue rate control with amiodarone   Anticoagulation with apixaban    Hyponatremia Today renal function with serum cr at 0,94 with K at 3,8 and serum bicarbonate at 30  Na 133 Mg 2,2   Plan to add Kcl to avoid hypokalemia.  Continue diuresis with furosemide , spironolactone , acetazolamide and SGLT 2 inh   COPD (chronic obstructive pulmonary disease) (HCC) No signs of acute exacerbation,  Continue oxymetry monitoring   Sleep apnea Cpap   Type 2 diabetes mellitus with hyperglycemia, with long-term current use of insulin  (HCC) Continue glucose cover and monitoring with insulin  sliding scale  Basal insulin  20 units.  Fasting glucose today 121 mg/dl   Generalized anxiety disorder Continue with alprazolam .       Subjective: patient with improvement in dyspnea and edema, no chest pain, today he is sitting in the chair   Physical Exam: Vitals:   08/22/23 2010 08/22/23 2306 08/23/23 0538 08/23/23 0841  BP: (!) 148/66 (!) 158/72 (!) 145/67 129/69  Pulse: 72 76 80 73  Resp: 20 19 17 15   Temp: 98.2 F (36.8 C) 98.1 F (36.7 C) 97.7 F (36.5 C)   TempSrc: Oral Oral Oral Oral  SpO2: 95% 96% 92% 96%  Weight:   113.6 kg   Height:       Neurology awake and alert ENT with mild pallor Cardiovascular with S1 and S2  present and regular with no gallops or rubs, no murmurs No JVD Respiratory with mild decreased breath sounds at bases with no wheezing or rhonchi  Abdomen with no distention, non tender No lower extremity edema  Data Reviewed:    Family Communication: no family at the bedside   Disposition: Status is: Inpatient Remains inpatient appropriate because: heart failure   Planned Discharge Destination: SNF    Author: Elidia Toribio Furnace,  MD 08/23/2023 11:18 AM  For on call review [email or web address].

## 2023-08-23 NOTE — Progress Notes (Signed)
 Physical Therapy Treatment Patient Details Name: [name]. MRN: [id number] DOB: 1950-06-22 Today's Date: 08/23/2023   History of Present Illness [name]. is a 73 y.o. male admitted 08/14/23 for CHF exacerbation. PMHx: HTN, T2DM, chronic diastolic CHF, PAF on Eliquis , obesity, OSA, and CAD s/p CABG x3 on 07/30/2023.    PT Comments  Pt received in recliner, pleasantly agreeable to therapy session and with good participation and tolerance for transfer, gait and simulated porch step negotiation. Pt needing increased time/effort and heavy minA for lift assist from lower chair and to sit on lower bed surface and needed x2 external assist in addition to RW support for balance correction with LOB during gait trials. Lift/steadying assist negotiating 7 step in room with BUE support. Cues frequently for improved compliance with sternal precs, pt reminded to follow up with cardiologist to confirm how long he needs to maintain and that he still needs to be compliant with Move in the Tube precs. Patient will benefit from continued inpatient follow up therapy, <3 hours/day.    If plan is discharge home, recommend the following: Assistance with cooking/housework;Assist for transportation;Help with stairs or ramp for entrance;A little help with walking and/or transfers;A little help with bathing/dressing/bathroom   Can travel by private vehicle     Yes  Equipment Recommendations  None recommended by PT    Recommendations for Other Services       Precautions / Restrictions Precautions Precautions: Sternal;Fall Precaution Booklet Issued: Yes (comment) Recall of Precautions/Restrictions: Impaired Precaution/Restrictions Comments: monitor O2, decreased compliance with sternal precs at times Restrictions Weight Bearing Restrictions Per Provider Order: No Other Position/Activity Restrictions: sternal precs     Mobility  Bed Mobility Overal bed mobility: Needs Assistance Bed Mobility:  Sit to Sidelying   Sidelying to sit: Min assist, Used rails       General bed mobility comments: cues for sternal precs and pt using heart pillow for comfort and to prevent accidental winging of elbow underneath him when returning to bed via sidelying. Needs BLE assist over edge of bed. Using legs to assist with roll to supine and shifting hips laterally for bridging to middle of bed, with arms holding pillow to maintain sternal precs.    Transfers Overall transfer level: Needs assistance Equipment used: Rolling walker (2 wheels) Transfers: Sit to/from Stand Sit to Stand: Min assist           General transfer comment: from chair height>RW and RW>lowest bed height, decreased eccentric control to sit. Cues for improved technique but pt unable to stand wtih only CGA from lower surfaces. Heavy minA to rise/stabilize. Pt reports sitting in chair >5 hours. Reinforced importance of standing every hour or so with staff assist to reduce risk of sores from bed/chair postures.    Ambulation/Gait Ambulation/Gait assistance: Min assist, Contact guard assist Gait Distance (Feet): 40 Feet (x3 with breaks) Assistive device: Rolling walker (2 wheels) Gait Pattern/deviations: Step-through pattern, Trunk flexed, Decreased stride length Gait velocity: decreased     General Gait Details: Lateral loss of balance despite use of RW while navigating in narrow spaces in the room, requiring minA to correct. Pt c/o incresaed fatigue after performing standing exercise, see below. VSS on RA.   Stairs Stairs: Yes Stairs assistance: Min assist Stair Management: Two rails, Step to pattern, Backwards, Forwards Number of Stairs: 1 General stair comments: ascending/descending single platform step in room (7.5 step) x1 rep with good leg, pt attempting with bad leg but cued to only perform  foot taps on step with RLE due to pt c/o incisional pain, after discussion pt agrees this is safer. RW to simulate bil  rails and needing multimodal cues to prevent winging elbows while pushing up to step.   Wheelchair Mobility     Tilt Bed    Modified Rankin (Stroke Patients Only)       Balance Overall balance assessment: Needs assistance Sitting-balance support: Feet supported Sitting balance-Leahy Scale: Good     Standing balance support: During functional activity, Reliant on assistive device for balance, No upper extremity supported Standing balance-Leahy Scale: Poor Standing balance comment: x2 LOB using RW, unsteady when unsupported                            Communication Communication Communication: No apparent difficulties  Cognition Arousal: Alert Behavior During Therapy: WFL for tasks assessed/performed   PT - Cognitive impairments: Memory, Sequencing, Problem solving                       PT - Cognition Comments: Pt A&O x 4, but needs reminders for sternal precs during functional tasks at times Following commands: Intact      Cueing Cueing Techniques: Verbal cues, Gestural cues, Tactile cues  Exercises Other Exercises Other Exercises: standing BLE AROM; foot taps on 7 platform x10 reps ea leg Other Exercises: reviewed use of IS/frequency, pt reports compliance    General Comments General comments (skin integrity, edema, etc.): Pressure relief cushion in chair but PTA reviewing with him to request assist to stand every hour at least while seated to reduce risk of pressure sores on bottom/tailbone from bed or chair. SpO2/HR WFL on RA      Pertinent Vitals/Pain Pain Assessment Pain Assessment: Faces Faces Pain Scale: Hurts a little bit Pain Location: incisional discomfort occasionally, and bottom pain from sitting prolonged in chair Pain Descriptors / Indicators: Guarding, Grimacing Pain Intervention(s): Monitored during session, Repositioned    Home Living                          Prior Function            PT Goals (current goals  can now be found in the care plan section) Acute Rehab PT Goals Patient Stated Goal: To return to rehab so I can get strong enough to care for my wife, who has advanced Parkinson's. PT Goal Formulation: With patient Time For Goal Achievement: 08/30/23 Progress towards PT goals: Progressing toward goals    Frequency    Min 2X/week      PT Plan      Co-evaluation              AM-PAC PT 6 Clicks Mobility   Outcome Measure  Help needed turning from your back to your side while in a flat bed without using bedrails?: A Little Help needed moving from lying on your back to sitting on the side of a flat bed without using bedrails?: A Lot Help needed moving to and from a bed to a chair (including a wheelchair)?: A Little Help needed standing up from a chair using your arms (e.g., wheelchair or bedside chair)?: A Little Help needed to walk in hospital room?: A Little Help needed climbing 3-5 steps with a railing? : A Lot 6 Click Score: 16    End of Session Equipment Utilized During Treatment: Gait belt Activity Tolerance: Patient tolerated  treatment well Patient left: in bed;with call bell/phone within reach;with bed alarm set Nurse Communication: Mobility status;Other (comment) (urinary suction canister needs to be emptied on wall) PT Visit Diagnosis: Difficulty in walking, not elsewhere classified (R26.2);Other abnormalities of gait and mobility (R26.89)     Time: 8452-8388 PT Time Calculation (min) (ACUTE ONLY): 24 min  Charges:    $Gait Training: 8-22 mins $Therapeutic Activity: 8-22 mins PT General Charges $$ ACUTE PT VISIT: 1 Visit                     Lynnetta Tom P., PTA Acute Rehabilitation Services Secure Chat Preferred 9a-5:30pm Office: [phone]    Connell HERO [facility] 08/23/2023, 4:44 PM

## 2023-08-23 NOTE — Progress Notes (Signed)
      [address]       [name] [address]             [phone]      3 Days Post-Op Procedure(s) (LRB): TRANSESOPHAGEAL ECHOCARDIOGRAM (N/A) CARDIOVERSION (N/A) Subjective: Reports breathing is better today. Able to pull on the incentive spirometer. Walked 220' yesterday with minimal assistance.     Objective: Vital signs in last 24 hours: Temp:  [97.6 F (36.4 C)-98.2 F (36.8 C)] 97.7 F (36.5 C) (06/26 0538) Pulse Rate:  [67-80] 80 (06/26 0538) Cardiac Rhythm: Heart block (06/25 1920) Resp:  [16-20] 17 (06/26 0538) BP: (122-158)/(49-72) 145/67 (06/26 0538) SpO2:  [92 %-96 %] 92 % (06/26 0538) FiO2 (%):  [32 %] 32 % (06/25 2358) Weight:  [113.6 kg] 113.6 kg (06/26 0538)    Intake/Output from previous day: 06/25 0701 - 06/26 0700 In: 1906.4 [P.O.:1440; I.V.:48.6; IV Piggyback:417.8] Out: 4350 [Urine:4350] Intake/Output this shift: No intake/output data recorded.  General appearance: alert, cooperative, and no distress Neurologic: intact Heart: stable SR  Lungs: breath sounds clear. Normal work of breathing on RA this morning.  Abdomen: obese, soft, no tenderness Extremities: bruising right thigh slowly improving, no palpable hematoma.  Unna boots in place Wound: the sternotomy incision is healing well.   Lab Results: Recent Labs    08/22/23 0323 08/23/23 0341  WBC 8.2 8.0  HGB 11.4* 12.5*  HCT 34.7* 38.0*  PLT 468* 485*   BMET:  Recent Labs    08/22/23 0323 08/23/23 0341  NA 131* 133*  K 3.7 3.8  CL 93* 92*  CO2 29 30  GLUCOSE 119* 121*  BUN 17 19  CREATININE 0.86 0.94  CALCIUM  8.4* 9.0    PT/INR: No results for input(s): LABPROT, INR in the last 72 hours. ABG    Component Value Date/Time   PHART 7.330 (L) 07/31/2023 0134   HCO3 24.8 07/31/2023 0134   TCO2 26 07/31/2023 0134   ACIDBASEDEF 1.0 07/31/2023 0134   O2SAT 97 07/31/2023 0134   CBG (last 3)  Recent Labs    08/22/23 2012 08/23/23 0005  08/23/23 0541  GLUCAP 173* 165* 114*    Assessment/Plan: S/P Procedure(s) (LRB): TRANSESOPHAGEAL ECHOCARDIOGRAM (N/A) CARDIOVERSION (N/A)  -Hospital day 8 re-admission post CABG with progressive shortness of breath, acute on chronic diastolic HF, a-flutter, and volume excess. Appreciate mgt per hospitalists and cardiology.   -A-Flutter: appears to be in SR after cardioversion on 6/23. On oral amiodarone  and abixaban.  -Left Pleural effusion: s/p ultrasound-guided left thoracentesis by IR 6/25 with 1.3L drained. The CXR is showing minimal residual fluid on the left and similar very small effusion on the right. Will give PO steroid taper to to minimize inflammation and risk of recurrent effusion.    -ID: no evidence of infiltrate on CXR after drainage of the effusion. No leukocytosis or fever. The ABX have been discontinued.    LOS: 8 days   Aarya Robinson G. Jaece Ducharme, PA-C 08/23/2023

## 2023-08-23 NOTE — TOC Progression Note (Signed)
 Transition of Care (TOC) - Progression Note    Patient Details  Name: [name]. MRN: [id number] Date of Birth: [birth year]  Transition of [facility]) CM/SW Contact  Arlana JINNY Nicholaus [place] Phone Number: [phone] 08/23/2023, 11:19 AM  Clinical Narrative:   Per MD, patient would like to return to [facility] on Monday. CSW spoke with Randine, who confirmed that the patient can return on Monday. CSW will work patient up for return on Monday 6/30.   TOC will continue following.     Expected Discharge Plan: Skilled Nursing Facility Barriers to Discharge: Continued Medical Work up  Expected Discharge Plan and Services                                               Social Determinants of Health (SDOH) Interventions SDOH Screenings   Food Insecurity: No Food Insecurity (08/15/2023)  Housing: Low Risk  (08/15/2023)  Transportation Needs: No Transportation Needs (08/15/2023)  Utilities: Not At Risk (08/15/2023)  Social Connections: Moderately Integrated (08/15/2023)  Tobacco Use: Medium Risk (08/20/2023)    Readmission Risk Interventions     No data to display

## 2023-08-23 NOTE — Progress Notes (Addendum)
 Advanced Heart Failure Rounding Note  Cardiologist: None  HF Cardiologist: Dr. Cherrie Chief Complaint: SOB Subjective:    S/p left thoracentesis yesterday, 1.3 of dark amber fluid removed + 4.4L in UOP w/ IV Lasix . Wt down 8 lb.  F/u CXR today w/ minimal residual fluid on the left.   Scr stable, 0.94. K 3.8   Maintaining NSR.  Feels better today. Breathing improved but says he still feels weak and deconditioned. No other complaints.     Objective:    Vital Signs:   Temp:  [97.6 F (36.4 C)-98.2 F (36.8 C)] 97.7 F (36.5 C) (06/26 0538) Pulse Rate:  [67-80] 80 (06/26 0538) Resp:  [16-20] 17 (06/26 0538) BP: (122-158)/(49-72) 145/67 (06/26 0538) SpO2:  [92 %-96 %] 92 % (06/26 0538) FiO2 (%):  [32 %] 32 % (06/25 2358) Weight:  [113.6 kg] 113.6 kg (06/26 0538) Last BM Date : 08/17/23  Weight change: Filed Weights   08/21/23 0616 08/22/23 0627 08/23/23 0538  Weight: 119.3 kg 117.1 kg 113.6 kg   Intake/Output:  Intake/Output Summary (Last 24 hours) at 08/23/2023 0752 Last data filed at 08/23/2023 0300 Gross per 24 hour  Intake 1906.39 ml  Output 4350 ml  Net -2443.61 ml    Physical Exam   General:  Well appearing. No respiratory difficulty HEENT: normal Neck: supple. JVD 7 cm  Cor: Regular rate & rhythm. No rubs, gallops or murmurs. Lungs: decreased BS bilaterally  Abdomen: obese, soft, nontender, non distended.  Extremities: no cyanosis, clubbing, rash, trace b/l LE edema, legs cool, + unna boots  Neuro: alert & oriented x 3, cranial nerves grossly intact. moves all 4 extremities w/o difficulty. Affect pleasant.   Telemetry   NSR 70s (personally reviewed)  EKG    EKG ordered.   Labs    CBC Recent Labs    08/22/23 0323 08/23/23 0341  WBC 8.2 8.0  NEUTROABS 6.1  --   HGB 11.4* 12.5*  HCT 34.7* 38.0*  MCV 88.5 88.2  PLT 468* 485*   Basic Metabolic Panel Recent Labs    93/74/74 0323 08/23/23 0341  NA 131* 133*  K 3.7 3.8  CL 93*  92*  CO2 29 30  GLUCOSE 119* 121*  BUN 17 19  CREATININE 0.86 0.94  CALCIUM  8.4* 9.0  MG 2.2 2.2   BNP (last 3 results) Recent Labs    07/24/23 1224 08/15/23 0006  BNP 243.3* 488.7*   Medications:    Scheduled Medications:  ALPRAZolam   0.25 mg Oral BID   amiodarone   400 mg Oral BID   amLODipine   5 mg Oral Daily   apixaban   5 mg Oral BID   aspirin  EC  81 mg Oral Daily   azelastine   1 spray Each Nare BID   carvedilol   6.25 mg Oral BID   empagliflozin   10 mg Oral Daily   feeding supplement  237 mL Oral BID BM   ferrous gluconate   324 mg Oral Q breakfast   fluticasone   2 spray Each Nare BID   insulin  aspart  0-6 Units Subcutaneous TID WC   insulin  glargine-yfgn  20 Units Subcutaneous Daily   olopatadine   1 drop Both Eyes Q12H   polyethylene glycol  34 g Oral QHS   potassium chloride   40 mEq Oral Once   senna-docusate  1 tablet Oral QHS   sodium chloride  flush  3 mL Intravenous Q12H   spironolactone   25 mg Oral Daily   terazosin   2 mg Oral QHS  Tiotropium Bromide  Monohydrate  2 puff Inhalation Daily   traZODone   100 mg Oral QHS   Infusions:   PRN Medications: acetaminophen  **OR** acetaminophen , hydrOXYzine , traMADol , trimethobenzamide  Patient Profile   [name]. is a 73 y.o. male with a hx of CAD s/p CABG, PAF on eliquis , HFpEF, alcohol use, cannabis use, hx of tobacco use, HLD with statin intolerance, COPD, DM2, CKD2, OSA on CPAP, obesity, remote hx of ICH, provoked DVT. Admitted with A/D HFpEF 2/2 atrial flutter.   Assessment/Plan   Acute on chronic diastolic heart failure, LVEF 50-55% - per notes, pt was taking daily 20 mg lasix , 25 mg coreg  BID, 97/103 mg entresto  BID, and 12.5 mg jardiance  outpatient prior to CABG (primarily follows at the [facility], last cards appt 06/29/23) - echo this admission with LVEF 50-55% - Volume improved w/ IV Lasix . Near euvolemic. Transition to PO diuretics Lasix  40 mg bid. If poor response, can switch to torsemide tomorrow  -  started on entresto  after CABG, stopped for hypotension. Would not restart with angioedema to ACE and cough to losartan - Continue amlodipine  5 mg daily - Continue coreg  6.25 mg bid - Continue spirolactone 25 mg daily. - Continue Jardiance  10 mg today - Continue UNNA boots   CABG x 3 07/30/23 - no chest pain, expected chest soreness - continue ASA, no plavix given eliquis    PAF Atrial flutter with CVR - eliquis  was resumed on 08/02/23 after CABG - Admit EKG with atrial flutter with variable block - s/p TEE/DCCV 6/23 > NSR. PACs improving - Continue coreg  6.25 mg bid - Continue amio 400 mg bid - Continue eliquis  5 mg bid   Chronic anticoagulation - on ASA post CABG in the setting of ACS presentation - on eliquis  without bleeding - restarted on 08/02/23 - PLT trending down   Hyperlipidemia with LDL goal < 55 - Consider lower LDL goal given smoking history - 5/25: Cholesterol 214; HDL 44; LDL Cholesterol 154; Triglycerides 79; VLDL 16 - He is statin intolerant and reported oral swelling with zetia - will need PCSK9i OP [facility]   Prior DVT - 2006 Prior subdural hematoma - 2010 - has done well on eliquis   HTN - controlled on current regimen - GDMT per above   ID: - completed course of cefepime for CAP  L pleural effusion - s/p thoracentesis 6/25, 1.3L removed - F/u CXR today w/ no residual fluid   Length of Stay: [address], PA-C  08/23/2023, 7:52 AM  Advanced Heart Failure Team Pager [phone] (M-F; 7a - 5p)  Please contact [facility] for night-coverage after hours (5p -7a ) and weekends on [email or web address]

## 2023-08-23 NOTE — Progress Notes (Signed)
 Mobility Specialist Progress Note:   08/23/23 0913  Mobility  Activity Ambulated with assistance in room;Ambulated with assistance in hallway  Level of Assistance Minimal assist, patient does 75% or more  Assistive Device Front wheel walker  Distance Ambulated (ft) 220 ft  RUE Weight Bearing Per Provider Order WBAT  LUE Weight Bearing Per Provider Order WBAT  Activity Response Tolerated well  Mobility Referral Yes  Mobility visit 1 Mobility  Mobility Specialist Start Time (ACUTE ONLY) 0913  Mobility Specialist Stop Time (ACUTE ONLY) 0925  Mobility Specialist Time Calculation (min) (ACUTE ONLY) 12 min   Pt received in bed, agreeable to mobility session. Required MinA to sit EOB. SV during ambulation. SpO2 94% on RA sitting EOB. Took one standing rest break when pt felt SOB, SpO2 87% on RA. Recovered, SpO2 96% on RA. Returned pt to room, sitting in chair with all needs met.   Baudelio Karnes Mobility Specialist Please contact via Special educational needs teacher or  Rehab office at [phone]

## 2023-08-24 DIAGNOSIS — I10 Essential (primary) hypertension: Secondary | ICD-10-CM | POA: Diagnosis not present

## 2023-08-24 DIAGNOSIS — I2510 Atherosclerotic heart disease of native coronary artery without angina pectoris: Secondary | ICD-10-CM | POA: Diagnosis not present

## 2023-08-24 DIAGNOSIS — I5033 Acute on chronic diastolic (congestive) heart failure: Secondary | ICD-10-CM | POA: Diagnosis not present

## 2023-08-24 DIAGNOSIS — I480 Paroxysmal atrial fibrillation: Secondary | ICD-10-CM | POA: Diagnosis not present

## 2023-08-24 LAB — GLUCOSE, CAPILLARY
Glucose-Capillary: 142 mg/dL — ABNORMAL HIGH (ref 70–99)
Glucose-Capillary: 138 mg/dL — ABNORMAL HIGH (ref 70–99)
Glucose-Capillary: 167 mg/dL — ABNORMAL HIGH (ref 70–99)
Glucose-Capillary: 193 mg/dL — ABNORMAL HIGH (ref 70–99)
Glucose-Capillary: 200 mg/dL — ABNORMAL HIGH (ref 70–99)

## 2023-08-24 LAB — BASIC METABOLIC PANEL WITH GFR
Anion gap: 13 (ref 5–15)
BUN: 22 mg/dL (ref 8–23)
CO2: 25 mmol/L (ref 22–32)
Calcium: 9 mg/dL (ref 8.9–10.3)
Chloride: 90 mmol/L — ABNORMAL LOW (ref 98–111)
Creatinine, Ser: 0.95 mg/dL (ref 0.61–1.24)
GFR, Estimated: >60 mL/min (ref >60)
Glucose, Bld: 167 mg/dL — ABNORMAL HIGH (ref 70–99)
Potassium: 4.2 mmol/L (ref 3.5–5.1)
Sodium: 128 mmol/L — ABNORMAL LOW (ref 135–145)

## 2023-08-24 MED ORDER — CARVEDILOL 12.5 MG PO TABS
12.5000 mg | ORAL_TABLET | Freq: Two times a day (BID) | ORAL | Status: DC
  Administered 2023-08-24 – 2023-08-27 (×7): 12.5 mg via ORAL
  Filled 2023-08-24 (×7): qty 1

## 2023-08-24 MED ORDER — SORBITOL 70 % SOLN
30.0000 mL | Freq: Once | Status: AC
  Administered 2023-08-24: 30 mL via ORAL
  Filled 2023-08-24 (×2): qty 30

## 2023-08-24 NOTE — Progress Notes (Signed)
 4 Days Post-Op Procedure(s) (LRB): TRANSESOPHAGEAL ECHOCARDIOGRAM (N/A) CARDIOVERSION (N/A) Subjective: Feels better post diuresis and thoracentesis Concerned about Atrial flutter  Objective: Vital signs in last 24 hours: Temp:  [97.6 F (36.4 C)-97.9 F (36.6 C)] 97.7 F (36.5 C) (06/27 0800) Pulse Rate:  [64-76] 73 (06/27 0800) Cardiac Rhythm: Heart block;Bundle branch block (06/27 0800) Resp:  [13-20] 13 (06/27 0800) BP: (119-144)/(61-78) 144/70 (06/27 0800) SpO2:  [95 %-99 %] 99 % (06/27 0800) Weight:  [114.4 kg] 114.4 kg (06/27 0522)  Hemodynamic parameters for last 24 hours:    Intake/Output from previous day: 06/26 0701 - 06/27 0700 In: 470 [P.O.:470] Out: 2550 [Urine:2550] Intake/Output this shift: No intake/output data recorded.  General appearance: alert, cooperative, and no distress Neurologic: intact Heart: regular rate and rhythm  Lab Results: Recent Labs    08/22/23 0323 08/23/23 0341  WBC 8.2 8.0  HGB 11.4* 12.5*  HCT 34.7* 38.0*  PLT 468* 485*   BMET:  Recent Labs    08/22/23 0323 08/23/23 0341  NA 131* 133*  K 3.7 3.8  CL 93* 92*  CO2 29 30  GLUCOSE 119* 121*  BUN 17 19  CREATININE 0.86 0.94  CALCIUM  8.4* 9.0    PT/INR: No results for input(s): LABPROT, INR in the last 72 hours. ABG    Component Value Date/Time   PHART 7.330 (L) 07/31/2023 0134   HCO3 24.8 07/31/2023 0134   TCO2 26 07/31/2023 0134   ACIDBASEDEF 1.0 07/31/2023 0134   O2SAT 97 07/31/2023 0134   CBG (last 3)  Recent Labs    08/23/23 2143 08/24/23 0601 08/24/23 0801  GLUCAP 192* 142* 138*    Assessment/Plan: S/P Procedure(s) (LRB): TRANSESOPHAGEAL ECHOCARDIOGRAM (N/A) CARDIOVERSION (N/A) S/p CABG 07/30/2023  Acute on chronic diastolic heart failure in setting of atrial flutter Has diuresed extensively and had 1.3 L taken off with thoracentesis Stable from a surgical standpoint   LOS: 9 days    [name] 08/24/2023

## 2023-08-24 NOTE — Progress Notes (Signed)
 Mobility Specialist Progress Note:   08/24/23 1522  Mobility  Activity Ambulated with assistance in room;Ambulated with assistance in hallway;Transferred from bed to chair  Level of Assistance Standby assist, set-up cues, supervision of patient - no hands on  Assistive Device Front wheel walker  Distance Ambulated (ft) 220 ft  RUE Weight Bearing Per Provider Order NWB  LUE Weight Bearing Per Provider Order NWB  Activity Response Tolerated well  Mobility Referral Yes  Mobility visit 1 Mobility  Mobility Specialist Start Time (ACUTE ONLY) 1515  Mobility Specialist Stop Time (ACUTE ONLY) 1522  Mobility Specialist Time Calculation (min) (ACUTE ONLY) 7 min   Pt received in bed, agreeable to mobility session. Ambulated in hallway with RW and SV. Tolerated well, asx throughout. Returned pt to room, sitting up in chair with all needs met, call bell in reach.  Detrich Rakestraw Mobility Specialist Please contact via Special educational needs teacher or  Rehab office at [phone]

## 2023-08-24 NOTE — NC FL2 (Signed)
 [place]  MEDICAID FL2 LEVEL OF CARE FORM     IDENTIFICATION  Patient Name: [name]. Birthdate: [birth year] Sex: male Admission Date (Current Location): 08/14/2023  [facility] and [place] Number:  Producer, television/film/video and Address:  [facility], [address] [place], [place] [address]      Provider Number: [id number]  Attending Physician Name and Address:  Noralee Elidia Sieving,*  Relative Name and Phone Number:  Jersey Ravenscroft; Daughter; [phone]    Current Level of Care: Hospital Recommended Level of Care: Skilled Nursing Facility Prior Approval Number:    Date Approved/Denied:   PASRR Number: [id number]  Discharge Plan: SNF    Current Diagnoses: Patient Active Problem List   Diagnosis Date Noted   Acute on chronic diastolic CHF (congestive heart failure) (HCC) 08/15/2023   Persistent atrial fibrillation (HCC) 08/03/2023   Ischemic cardiomyopathy 07/30/2023   Hypertension associated with diabetes (HCC) 07/30/2023   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   Cataract of left eye 07/28/2023   Benign prostatic hyperplasia with lower urinary tract symptoms 07/28/2023   Gastric contents in larynx causing other injury, initial encounter 07/28/2023   Generalized anxiety disorder 07/28/2023   Personal history of colon polyps, unspecified 07/28/2023   Morbid obesity (HCC) 07/28/2023   Non-toxic multinodular goiter 07/28/2023   Phlebitis and thrombophlebitis of lower extremities 07/28/2023   Sensorineural hearing loss, bilateral 07/28/2023   Statin not tolerated 07/28/2023   Coronary artery disease involving native coronary artery of native heart without angina pectoris 07/27/2023   NSTEMI (non-ST elevated myocardial infarction) (HCC)  Cardiomyopathy 07/24/2023   T2DM (type 2 diabetes mellitus) (HCC) 07/24/2023   COPD (chronic obstructive pulmonary disease) (HCC) 07/24/2023   Alcohol use  Hyponatremia 07/24/2023   Chronic health problem 07/24/2023    Paroxysmal atrial fibrillation (HCC) 09/22/2021   Mixed hyperlipidemia 09/20/2021   Hyponatremia 09/07/2019   Diverticulitis 09/07/2019   Constipated 09/07/2019   Dehydration 09/07/2019   Constipation 09/07/2019   Colon stricture - diverticular (HCC) 09/07/2019   Chest pain 07/31/2019   Sleep apnea 07/31/2019   Essential hypertension, benign    Dyspepsia    Type 2 diabetes mellitus with hyperglycemia, with long-term current use of insulin  (HCC)     Orientation RESPIRATION BLADDER Height & Weight     Self, Time, Situation, Place  Normal Continent Weight: 252 lb 3.3 oz (114.4 kg) Height:  6' 2 (188 cm)  BEHAVIORAL SYMPTOMS/MOOD NEUROLOGICAL BOWEL NUTRITION STATUS      Continent Diet (See dc summary)  AMBULATORY STATUS COMMUNICATION OF NEEDS Skin   Limited Assist Verbally Normal                       Personal Care Assistance Level of Assistance  Bathing, Feeding, Dressing Bathing Assistance: Limited assistance Feeding assistance: Limited assistance Dressing Assistance: Limited assistance     Functional Limitations Info  Sight, Hearing, Speech Sight Info: Adequate Hearing Info: Adequate Speech Info: Adequate    SPECIAL CARE FACTORS FREQUENCY  PT (By licensed PT), OT (By licensed OT)     PT Frequency: 5x OT Frequency: 5x            Contractures      Additional Factors Info  Code Status, Allergies, Insulin  Sliding Scale Code Status Info: Full code Allergies Info: Ace Inhibitors; Gabapentin; Peanut Oil; Tamiflu; Losartan; Lyrica; Oxcarbazepine; Statins; Avelox (Moxifloxacin Hcl In Nacl); Ciprofloxacin ; Citalopram; Victoza (Liraglutide)   Insulin  Sliding Scale Info: Please see discharge summary  Current Medications (08/24/2023):  This is the current hospital active medication list Current Facility-Administered Medications  Medication Dose Route Frequency Provider Last Rate Last Admin   acetaminophen  (TYLENOL ) tablet 650 mg  650 mg Oral Q6H PRN  Opyd, Timothy S, MD   650 mg at 08/24/23 1014   Or   acetaminophen  (TYLENOL ) suppository 650 mg  650 mg Rectal Q6H PRN Opyd, Timothy S, MD       ALPRAZolam  (XANAX ) tablet 0.25 mg  0.25 mg Oral BID Ezenduka, Nkeiruka J, MD   0.25 mg at 08/24/23 9177   amiodarone  (PACERONE ) tablet 400 mg  400 mg Oral BID Zimmerman, Donielle M, PA-C   400 mg at 08/24/23 0821   amLODipine  (NORVASC ) tablet 5 mg  5 mg Oral Daily Stoner, Benjamin J, MD   5 mg at 08/24/23 9178   apixaban  (ELIQUIS ) tablet 5 mg  5 mg Oral BID Opyd, Timothy S, MD   5 mg at 08/24/23 9178   aspirin  EC tablet 81 mg  81 mg Oral Daily Opyd, Timothy S, MD   81 mg at 08/24/23 9178   azelastine  (ASTELIN ) 0.1 % nasal spray 1 spray  1 spray Each Nare BID Ezenduka, Nkeiruka J, MD   1 spray at 08/23/23 2201   carvedilol  (COREG ) tablet 12.5 mg  12.5 mg Oral BID Lee, [place], NP   12.5 mg at 08/24/23 9167   empagliflozin  (JARDIANCE ) tablet 10 mg  10 mg Oral Daily Mona Vinie BROCKS, MD   10 mg at 08/24/23 9178   feeding supplement (ENSURE PLUS HIGH PROTEIN) liquid 237 mL  237 mL Oral BID BM Ezenduka, Nkeiruka J, MD   237 mL at 08/24/23 9176   ferrous gluconate  (FERGON) tablet 324 mg  324 mg Oral Q breakfast Ezenduka, Nkeiruka J, MD   324 mg at 08/24/23 9176   fluticasone  (FLONASE ) 50 MCG/ACT nasal spray 2 spray  2 spray Each Nare BID Ezenduka, Nkeiruka J, MD   2 spray at 08/24/23 9176   furosemide  (LASIX ) tablet 40 mg  40 mg Oral BID Marcine Catalan M, PA-C   40 mg at 08/24/23 0820   hydrOXYzine  (ATARAX ) tablet 25 mg  25 mg Oral BID PRN Bitonti, Michael T, RPH   25 mg at 08/22/23 2224   insulin  aspart (novoLOG ) injection 0-6 Units  0-6 Units Subcutaneous TID WC Arrien, Mauricio Daniel, MD   1 Units at 08/23/23 1722   insulin  glargine-yfgn (SEMGLEE ) injection 20 Units  20 Units Subcutaneous Daily Opyd, Timothy S, MD   20 Units at 08/24/23 0825   olopatadine  (PATANOL) 0.1 % ophthalmic solution 1 drop  1 drop Both Eyes Q12H Roddenberry, Myron G, PA-C   1 drop  at 08/24/23 0546   polyethylene glycol (MIRALAX  / GLYCOLAX ) packet 34 g  34 g Oral QHS Ezenduka, Nkeiruka J, MD   34 g at 08/23/23 2202   predniSONE  (STERAPRED UNI-PAK 21 TAB) tablet 10 mg  10 mg Oral 3 x daily with food Arrien, Mauricio Daniel, MD   10 mg at 08/24/23 0825   [START ON 08/25/2023] predniSONE  (STERAPRED UNI-PAK 21 TAB) tablet 10 mg  10 mg Oral 4X daily taper Arrien, Mauricio Daniel, MD       predniSONE  (STERAPRED UNI-PAK 21 TAB) tablet 20 mg  20 mg Oral Nightly Arrien, Elidia Sieving, MD       senna-docusate (Senokot-S) tablet 1 tablet  1 tablet Oral QHS Ezenduka, Nkeiruka J, MD   1 tablet at 08/23/23 2159   sodium chloride  flush (  NS) 0.9 % injection 3 mL  3 mL Intravenous Q12H Opyd, Timothy S, MD   3 mL at 08/24/23 0825   sorbitol 70 % solution 30 mL  30 mL Oral Once Lee, [place], NP       spironolactone  (ALDACTONE ) tablet 25 mg  25 mg Oral Daily Lee, [place], NP   25 mg at 08/24/23 9178   terazosin  (HYTRIN ) capsule 2 mg  2 mg Oral QHS Opyd, Timothy S, MD   2 mg at 08/23/23 2200   Tiotropium Bromide  Monohydrate AERS 2 puff  2 puff Inhalation Daily Ezenduka, Nkeiruka J, MD   2 puff at 08/21/23 1052   traMADol  (ULTRAM ) tablet 50 mg  50 mg Oral Q6H PRN Roddenberry, Myron G, PA-C   50 mg at 08/24/23 0547   traZODone  (DESYREL ) tablet 100 mg  100 mg Oral QHS Opyd, Timothy S, MD   100 mg at 08/23/23 2159   trimethobenzamide  (TIGAN ) injection 200 mg  200 mg Intramuscular Q6H PRN Opyd, Timothy S, MD         Discharge Medications: Please see discharge summary for a list of discharge medications.  Relevant Imaging Results:  Relevant Lab Results:   Additional Information SS# [id number]  Arlana JINNY Moats, LCSWA

## 2023-08-24 NOTE — Progress Notes (Signed)
 Advanced Heart Failure Rounding Note  Cardiologist: None  HF Cardiologist: Dr. Cherrie Chief Complaint: SOB Subjective:    S/p left thoracentesis yesterday, 1.3 of dark amber fluid removed 6/26. 2.6L UOP, I/Os not complete. Weight up 2 lbs, ?accuracy. Labs pending.  Lying in bed on CPAP. Breathing is much improved. No CP. Has been ambulating without O2 during the day.   Objective:    Vital Signs:   Temp:  [97.6 F (36.4 C)-97.9 F (36.6 C)] 97.6 F (36.4 C) (06/27 0522) Pulse Rate:  [64-76] 76 (06/27 0522) Resp:  [15-20] 16 (06/27 0522) BP: (119-137)/(61-78) 136/70 (06/27 0522) SpO2:  [95 %-99 %] 99 % (06/27 0522) Weight:  [114.4 kg] 114.4 kg (06/27 0522) Last BM Date : 08/17/23  Weight change: Filed Weights   08/22/23 0627 08/23/23 0538 08/24/23 0522  Weight: 117.1 kg 113.6 kg 114.4 kg   Intake/Output:  Intake/Output Summary (Last 24 hours) at 08/24/2023 0744 Last data filed at 08/24/2023 0600 Gross per 24 hour  Intake 470 ml  Output 2550 ml  Net -2080 ml    Physical Exam   General: Elderly appearing. No distress on RA Cardiac: JVP flat. S1 and S2 present. No murmurs or rub. Extremities: Warm and dry.  No peripheral edema. + UNNA boots Neuro: Alert and oriented x3. Affect pleasant. Moves all extremities without difficulty.  Telemetry   SR in 70s (personally reviewed)  EKG    No new EKG to review  Labs    CBC Recent Labs    08/22/23 0323 08/23/23 0341  WBC 8.2 8.0  NEUTROABS 6.1  --   HGB 11.4* 12.5*  HCT 34.7* 38.0*  MCV 88.5 88.2  PLT 468* 485*   Basic Metabolic Panel Recent Labs    93/74/74 0323 08/23/23 0341  NA 131* 133*  K 3.7 3.8  CL 93* 92*  CO2 29 30  GLUCOSE 119* 121*  BUN 17 19  CREATININE 0.86 0.94  CALCIUM  8.4* 9.0  MG 2.2 2.2   BNP (last 3 results) Recent Labs    07/24/23 1224 08/15/23 0006  BNP 243.3* 488.7*   Medications:    Scheduled Medications:  ALPRAZolam   0.25 mg Oral BID   amiodarone   400 mg Oral  BID   amLODipine   5 mg Oral Daily   apixaban   5 mg Oral BID   aspirin  EC  81 mg Oral Daily   azelastine   1 spray Each Nare BID   carvedilol   6.25 mg Oral BID   empagliflozin   10 mg Oral Daily   feeding supplement  237 mL Oral BID BM   ferrous gluconate   324 mg Oral Q breakfast   fluticasone   2 spray Each Nare BID   furosemide   40 mg Oral BID   insulin  aspart  0-6 Units Subcutaneous TID WC   insulin  glargine-yfgn  20 Units Subcutaneous Daily   olopatadine   1 drop Both Eyes Q12H   polyethylene glycol  34 g Oral QHS   predniSONE   10 mg Oral 3 x daily with food   [START ON 08/25/2023] predniSONE   10 mg Oral 4X daily taper   predniSONE   20 mg Oral Nightly   senna-docusate  1 tablet Oral QHS   sodium chloride  flush  3 mL Intravenous Q12H   spironolactone   25 mg Oral Daily   terazosin   2 mg Oral QHS   Tiotropium Bromide  Monohydrate  2 puff Inhalation Daily   traZODone   100 mg Oral QHS   Infusions:  PRN Medications: acetaminophen  **OR** acetaminophen , hydrOXYzine , traMADol , trimethobenzamide   Patient Profile   [name]. is a 73 y.o. male with a hx of CAD s/p CABG, PAF on eliquis , HFpEF, alcohol use, cannabis use, hx of tobacco use, HLD with statin intolerance, COPD, DM2, CKD2, OSA on CPAP, obesity, remote hx of ICH, provoked DVT. Admitted with A/D HFpEF 2/2 atrial flutter.   Assessment/Plan   Acute on chronic diastolic heart failure, LVEF 50-55% - per notes, pt was taking daily 20 mg lasix , 25 mg coreg  BID, 97/103 mg entresto  BID, and 12.5 mg jardiance  outpatient prior to CABG (primarily follows at the [facility], last cards appt 06/29/23) - echo this admission with LVEF 50-55% - started on entresto  after CABG, stopped for hypotension. Would not restart with angioedema to ACE and cough to losartan - Appears near euvolemic. Continue Lasix  40 mg bid - Continue amlodipine  5 mg daily - Increase coreg  to 12.5 mg bid (home dose) - Continue spirolactone 25 mg daily. Labs pending.  -  Continue Jardiance  10 mg today   CABG x 3 07/30/23 - no chest pain, expected chest soreness - continue ASA, no plavix given eliquis    PAF Atrial flutter with CVR - eliquis  was resumed on 08/02/23 after CABG - Admit EKG with atrial flutter with variable block - s/p TEE/DCCV 6/23 > NSR. PACs improving - Increase coreg  to 12.5 mg bid (home dose) - Continue amio 400 mg bid - Continue eliquis  5 mg bid   Chronic anticoagulation - on ASA post CABG in the setting of ACS presentation - on eliquis  without bleeding - restarted on 08/02/23 - PLT trending down   Hyperlipidemia with LDL goal < 55 - Consider lower LDL goal given smoking history - 5/25: Cholesterol 214; HDL 44; LDL Cholesterol 154; Triglycerides 79; VLDL 16 - He is statin intolerant and reported oral swelling with zetia - will need PCSK9i OP [facility]   Prior DVT - 2006 Prior subdural hematoma - 2010 - has done well on eliquis   HTN - controlled on current regimen - GDMT per above   ID: - completed course of cefepime  for CAP  L pleural effusion - s/p thoracentesis 6/25, 1.3L removed - F/u CXR today w/ no residual fluid  - SOB much improved  Anticipate discharge soon.   Length of Stay: 45  [place] Jasmine Mcbeth, NP  08/24/2023, 7:44 AM  Advanced Heart Failure Team Pager [phone] (M-F; 7a - 5p)  Please contact [facility] for night-coverage after hours (5p -7a ) and weekends on [email or web address]

## 2023-08-24 NOTE — Progress Notes (Signed)
 Progress Note   Patient: [name]. FMW:[id number] DOB: 1950-10-24 DOA: 08/14/2023     9 DOS: the patient was seen and examined on 08/24/2023   Brief hospital course: Mr.,Peltz was admitted to the hospital with the working diagnosis of heart failure decompensation   73 y.o. male with medical history significant for HTN, DM2, heart failure, paroxysmal atrial fibrillation. obesity, OSA, and CAD s/p CABG who presents from his SNF for evaluation of worsening dyspnea and lower extremity edema over the past several days,  Recent hospitalization 05/27 to 08/10/23, for NSTEMI that required revascularization with CABG. He was discharge to SNF with instruction to  hold entresto  and resume furosemide  on 08/13/23.  On the day of admission EMS was called to SNF due to patient having respiratory distress. He was placed on Cpap and transported to the ED.  On his initial physical examination his blood pressure was 162/85, HR 82, RR 19 and 25 and 02 saturation 94% on Cpap and supplemental 02.   Na 128, K 4,1 Cl 90, bicarbonate 27 glucose 207 bun 13 cr 0.73  BNP 488 High sensitive troponin 83, 82 and 80  Wbc 10,5 hgb 11.0 plt 559   Chest radiograph with cardiomegaly, bilateral hilar vascular congestion, bilateral central interstitial infiltrates. Small left pleural effusion, sternotomy wires in place,   EKG 89 bpm, left axis deviation, left bundle branch block, qtc 503 atrial fibrillation rhythm with no significant ST segment or T wave changes.   Ct chest with no evidence of pulmonary embolism. Nondisplaced right posterior 1st rib fracture. No pneumothorax.  Small bilateral pleural effusions, partially loculated on the left.   Assessment and Plan: * Acute on chronic diastolic CHF (congestive heart failure) (HCC) Echocardiogram with preserved LV systolic function with EF 50 to 55%, mild LVH, moderate hypokinesis of the left ventricular mid apical septal wall and apical segment. RV not well visualized,  LA with mild dilatation, no significant valvular disease.   Urine output 2,550  cc Systolic blood pressure 120 mmHg.   Diuresis with po furosemide  40 mg po bid.  Continue carvedilol , SGLT 2 inh, and spironolactone   Sp Acetazolamide  500 mg bid x 2 doses  Patient requesting off unna boots, will place ted hose instead.   Acute cardiogenic pulmonary edema with left pleural effusion.  Left pleural effusion, sp thoracentesis 1.3 L  Chest radiograph with improvement in left pleural effusion, no fever and no leukocytosis, Continue to hold on antibiotic therapy  On steroids per CT surgery   Coronary artery disease involving native coronary artery of native heart without angina pectoris Recent CABG 05/27 to 08/10/23  No chest pain continue blood pressure control.   Essential hypertension, benign Continue blood pressure control with amlodipine , carvedilol  and spironolactone    Paroxysmal atrial fibrillation (HCC) Continue rate control with amiodarone   Anticoagulation with apixaban    Hyponatremia Follow up renal function with serum cr at 0,95, with K at 4,2 and serum bicarbonate ate 25  Na 128   Continue diuresis with furosemide , spironolactone  and SGLT 2 inh. Follow up renal function and electrolytes.    COPD (chronic obstructive pulmonary disease) (HCC) No signs of acute exacerbation,  Continue oxymetry monitoring   Sleep apnea Cpap   Type 2 diabetes mellitus with hyperglycemia, with long-term current use of insulin  (HCC) Continue glucose cover and monitoring with insulin  sliding scale  Basal insulin  20 units.  Fasting glucose today 167 mg/dl   Generalized anxiety disorder Continue with alprazolam .  Subjective: patient is feeling better, no chest pain and no dyspnea, no lower extremity edema, no PND or orthopnea.   Physical Exam: Vitals:   08/23/23 2123 08/24/23 0522 08/24/23 0800 08/24/23 1158  BP:  136/70 (!) 144/70 108/81  Pulse:  76 73 73  Resp: 20 16 13  19   Temp:  97.6 F (36.4 C) 97.7 F (36.5 C)   TempSrc:  Oral Oral   SpO2:  99% 99% 98%  Weight:  114.4 kg    Height:       Neurology awake and alert ENT with mild pallor Cardiovascular with S1 and S2 present and regular with no gallops, rubs or murmurs Respiratory with no rales or wheezing, mild decreased breath sounds at bases with no rhonchi  Abdomen with no distention  Trace lower extremity edema, unna boots in place  Data Reviewed:    Family Communication: no family at the bedside   Disposition: Status is: Inpatient Remains inpatient appropriate because: heart failure recovery   Planned Discharge Destination: Skilled nursing facility     Author: Elidia Toribio Furnace, MD 08/24/2023 2:04 PM  For on call review [email or web address].

## 2023-08-24 NOTE — TOC Progression Note (Addendum)
 Transition of Care (TOC) - Progression Note    Patient Details  Name: [name]. MRN: [id number] Date of Birth: 10-04-1950  Transition of Care [facility]) CM/SW Contact  Arlana JINNY Nicholaus [place] Phone Number: [phone] 08/24/2023, 12:23 PM  Clinical Narrative:   HF CSW updated the patients FL2 and completed insurance auth. The plan is for the patient to return back to [facility].   Auth ID: [id number]; Currently pending. Will update once status changes.   TOC will continue following.     Expected Discharge Plan: Skilled Nursing Facility Barriers to Discharge: Continued Medical Work up  Expected Discharge Plan and Services                                               Social Determinants of Health (SDOH) Interventions SDOH Screenings   Food Insecurity: No Food Insecurity (08/15/2023)  Housing: Low Risk  (08/15/2023)  Transportation Needs: No Transportation Needs (08/15/2023)  Utilities: Not At Risk (08/15/2023)  Social Connections: Moderately Integrated (08/15/2023)  Tobacco Use: Medium Risk (08/20/2023)    Readmission Risk Interventions     No data to display

## 2023-08-25 DIAGNOSIS — I10 Essential (primary) hypertension: Secondary | ICD-10-CM | POA: Diagnosis not present

## 2023-08-25 DIAGNOSIS — I2510 Atherosclerotic heart disease of native coronary artery without angina pectoris: Secondary | ICD-10-CM | POA: Diagnosis not present

## 2023-08-25 DIAGNOSIS — I5033 Acute on chronic diastolic (congestive) heart failure: Secondary | ICD-10-CM | POA: Diagnosis not present

## 2023-08-25 DIAGNOSIS — I480 Paroxysmal atrial fibrillation: Secondary | ICD-10-CM | POA: Diagnosis not present

## 2023-08-25 LAB — BASIC METABOLIC PANEL WITH GFR
Anion gap: 10 (ref 5–15)
BUN: 29 mg/dL — ABNORMAL HIGH (ref 8–23)
CO2: 28 mmol/L (ref 22–32)
Calcium: 9.1 mg/dL (ref 8.9–10.3)
Chloride: 92 mmol/L — ABNORMAL LOW (ref 98–111)
Creatinine, Ser: 1.04 mg/dL (ref 0.61–1.24)
GFR, Estimated: >60 mL/min (ref >60)
Glucose, Bld: 254 mg/dL — ABNORMAL HIGH (ref 70–99)
Potassium: 4.3 mmol/L (ref 3.5–5.1)
Sodium: 130 mmol/L — ABNORMAL LOW (ref 135–145)

## 2023-08-25 LAB — GLUCOSE, CAPILLARY
Glucose-Capillary: 184 mg/dL — ABNORMAL HIGH (ref 70–99)
Glucose-Capillary: 253 mg/dL — ABNORMAL HIGH (ref 70–99)
Glucose-Capillary: 209 mg/dL — ABNORMAL HIGH (ref 70–99)
Glucose-Capillary: 246 mg/dL — ABNORMAL HIGH (ref 70–99)

## 2023-08-25 NOTE — Progress Notes (Signed)
 Physical Therapy Treatment Patient Details Name: [name]. MRN: [id number] DOB: 05-30-1950 Today's Date: 08/25/2023   History of Present Illness [name]. is a 73 y.o. male admitted 08/14/23 for CHF exacerbation. PMHx: HTN, T2DM, chronic diastolic CHF, PAF on Eliquis , obesity, OSA, and CAD s/p CABG x3 on 07/30/2023.    PT Comments  Pt has significant difficulty remembering sternal precautions and adhering to them while performing functional activities. Currently pt is Min A to CGA for sit to stand and gait. Pt was able to significantly increase gait distance with rollator. Pt requires encouragement to increase gait distance due to pt concerns of fatigue/shortness of breathe. O2 sats remained in the 90's and no significant shortness of breathe noted during session. Due to pt current functional status, home set up and available assistance at home recommending skilled physical therapy services < 3 hours/day in order to address strength, balance and functional mobility to decrease risk for falls, injury, immobility, skin break down and re-hospitalization.     If plan is discharge home, recommend the following: Assistance with cooking/housework;Assist for transportation;Help with stairs or ramp for entrance;A little help with walking and/or transfers   Can travel by private vehicle     Yes  Equipment Recommendations  None recommended by PT       Precautions / Restrictions Precautions Precautions: Sternal;Fall Precaution Booklet Issued: No Recall of Precautions/Restrictions: Impaired Precaution/Restrictions Comments: monitor O2, decreased compliance with sternal precs at times Restrictions Weight Bearing Restrictions Per Provider Order: Yes RUE Weight Bearing Per Provider Order: Non weight bearing LUE Weight Bearing Per Provider Order: Non weight bearing Other Position/Activity Restrictions: sternal precs     Mobility  Bed Mobility     General bed mobility comments: pt  in recliner on arrival and departure.    Transfers Overall transfer level: Needs assistance Equipment used: Rollator (4 wheels) Transfers: Sit to/from Stand Sit to Stand: Contact guard assist, Min assist           General transfer comment: verbal cues constantly for safe hand placement during sit to stand in order to maintain sternal precautions. MIn A to CGA for instability in standing.    Ambulation/Gait Ambulation/Gait assistance: Contact guard assist Gait Distance (Feet): 150 Feet (80) Assistive device: Rollator (4 wheels) Gait Pattern/deviations: Step-through pattern, Trunk flexed, Decreased stride length Gait velocity: decreased Gait velocity interpretation: <1.8 ft/sec, indicate of risk for recurrent falls   General Gait Details: 80 ft then 150 ft with rollator, pt reporting shortness of breathe no shortness of breathe noted pt required no recovery time once in sitting. Pt stood at chair to arrange pillows and linens after 150 ft of gait this was pointed out to pt in order to improve pt confidence.     Balance Overall balance assessment: Needs assistance Sitting-balance support: Feet supported Sitting balance-Leahy Scale: Good Sitting balance - Comments: pt sitting recliner at mod I   Standing balance support: During functional activity, Reliant on assistive device for balance, No upper extremity supported Standing balance-Leahy Scale: Fair Standing balance comment: no overt LOB      Communication Communication Communication: No apparent difficulties Factors Affecting Communication: Hearing impaired  Cognition Arousal: Alert Behavior During Therapy: WFL for tasks assessed/performed   PT - Cognitive impairments: Memory, Sequencing, Problem solving   PT - Cognition Comments: Pt A&O x 4, but needs reminders for sternal precs during functional tasks at times Following commands: Intact      Cueing Cueing Techniques: Verbal cues, Gestural cues, Tactile  cues          Pertinent Vitals/Pain Pain Assessment Pain Assessment: Faces Faces Pain Scale: Hurts a little bit Breathing: normal Negative Vocalization: none Facial Expression: smiling or inexpressive Body Language: relaxed Consolability: no need to console PAINAD Score: 0 Pain Intervention(s): Monitored during session     PT Goals (current goals can now be found in the care plan section) Acute Rehab PT Goals Patient Stated Goal: To return to rehab so I can get strong enough to care for my wife, who has advanced Parkinson's. Time For Goal Achievement: 08/30/23 Potential to Achieve Goals: Good Progress towards PT goals: Progressing toward goals    Frequency    Min 2X/week      PT Plan  Continue with current POC        AM-PAC PT 6 Clicks Mobility   Outcome Measure  Help needed turning from your back to your side while in a flat bed without using bedrails?: A Little Help needed moving from lying on your back to sitting on the side of a flat bed without using bedrails?: A Lot Help needed moving to and from a bed to a chair (including a wheelchair)?: A Little Help needed standing up from a chair using your arms (e.g., wheelchair or bedside chair)?: A Little Help needed to walk in hospital room?: A Little Help needed climbing 3-5 steps with a railing? : A Little 6 Click Score: 17    End of Session Equipment Utilized During Treatment: Gait belt Activity Tolerance: Patient tolerated treatment well Patient left: in chair;with call bell/phone within reach Nurse Communication: Mobility status PT Visit Diagnosis: Difficulty in walking, not elsewhere classified (R26.2);Other abnormalities of gait and mobility (R26.89)     Time: 8743-8674 PT Time Calculation (min) (ACUTE ONLY): 29 min  Charges:    $Therapeutic Activity: 23-37 mins PT General Charges $$ ACUTE PT VISIT: 1 Visit                     Dorothyann Maier, DPT, CLT  Acute Rehabilitation Services Office:  [phone] (Secure chat preferred)    Dorothyann VEAR Maier 08/25/2023, 1:38 PM

## 2023-08-25 NOTE — TOC Progression Note (Signed)
 Transition of Care (TOC) - Initial/Assessment Note    Patient Details  Name: [name]. MRN: [id number] Date of Birth: 09/23/1950  [facility]) CM/SW Contact:    Britt JULIANNA Bennetts, LCSW Phone Number: 08/25/2023, 3:39 PM  Clinical Narrative:                 Insurance authorization for SNF has been approved.  Authorization number- [id number] Approved from 08/25/2023-08/28/2023  [facility] will continue to follow.  Expected Discharge Plan: Skilled Nursing Facility Barriers to Discharge: Continued Medical Work up   Patient Goals and CMS Choice            Expected Discharge Plan and Services                                              Prior Living Arrangements/Services                       Activities of Daily Living   ADL Screening (condition at time of admission) Independently performs ADLs?: No Does the patient have a NEW difficulty with bathing/dressing/toileting/self-feeding that is expected to last >3 days?: No Does the patient have a NEW difficulty with getting in/out of bed, walking, or climbing stairs that is expected to last >3 days?: No Does the patient have a NEW difficulty with communication that is expected to last >3 days?: No Is the patient deaf or have difficulty hearing?: No Does the patient have difficulty seeing, even when wearing glasses/contacts?: No Does the patient have difficulty concentrating, remembering, or making decisions?: No  Permission Sought/Granted                  Emotional Assessment              Admission diagnosis:  SOB (shortness of breath) [R06.02] Acute on chronic diastolic CHF (congestive heart failure) (HCC) [I50.33] Patient Active Problem List   Diagnosis Date Noted   Acute on chronic diastolic CHF (congestive heart failure) (HCC) 08/15/2023   Persistent atrial fibrillation (HCC) 08/03/2023   Ischemic cardiomyopathy 07/30/2023   Hypertension associated with diabetes (HCC)  07/30/2023   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   Cataract of left eye 07/28/2023   Benign prostatic hyperplasia with lower urinary tract symptoms 07/28/2023   Gastric contents in larynx causing other injury, initial encounter 07/28/2023   Generalized anxiety disorder 07/28/2023   Personal history of colon polyps, unspecified 07/28/2023   Morbid obesity (HCC) 07/28/2023   Non-toxic multinodular goiter 07/28/2023   Phlebitis and thrombophlebitis of lower extremities 07/28/2023   Sensorineural hearing loss, bilateral 07/28/2023   Statin not tolerated 07/28/2023   Coronary artery disease involving native coronary artery of native heart without angina pectoris 07/27/2023   NSTEMI (non-ST elevated myocardial infarction) (HCC)  Cardiomyopathy 07/24/2023   T2DM (type 2 diabetes mellitus) (HCC) 07/24/2023   COPD (chronic obstructive pulmonary disease) (HCC) 07/24/2023   Alcohol use  Hyponatremia 07/24/2023   Chronic health problem 07/24/2023   Paroxysmal atrial fibrillation (HCC) 09/22/2021   Mixed hyperlipidemia 09/20/2021   Hyponatremia 09/07/2019   Diverticulitis 09/07/2019   Constipated 09/07/2019   Dehydration 09/07/2019   Constipation 09/07/2019   Colon stricture - diverticular (HCC) 09/07/2019   Chest pain 07/31/2019   Sleep apnea 07/31/2019   Essential hypertension, benign    Dyspepsia    Type 2 diabetes mellitus  with hyperglycemia, with long-term current use of insulin  [facility])    PCP:  Clinic, [facility] Pharmacy:   [facility] - [place], [place] - [address] [place] [address] Phone: [phone] Fax: [phone]     Social Drivers of Health (SDOH) Social History: SDOH Screenings   Food Insecurity: No Food Insecurity (08/15/2023)  Housing: Low Risk  (08/15/2023)  Transportation Needs: No Transportation Needs (08/15/2023)  Utilities: Not At Risk (08/15/2023)  Social Connections: Moderately  Integrated (08/15/2023)  Tobacco Use: Medium Risk (08/20/2023)   SDOH Interventions:     Readmission Risk Interventions     No data to display

## 2023-08-25 NOTE — Progress Notes (Signed)
 pa Progress Note   Patient: [name]. FMW:[id number] DOB: 06/18/1950 DOA: 08/14/2023     10 DOS: the patient was seen and examined on 08/25/2023   Brief hospital course: Mr.,[name] was admitted to the hospital with the working diagnosis of heart failure decompensation   73 y.o. male with medical history significant for HTN, DM2, heart failure, paroxysmal atrial fibrillation. obesity, OSA, and CAD s/p CABG who presents from his SNF for evaluation of worsening dyspnea and lower extremity edema over the past several days,  Recent hospitalization 05/27 to 08/10/23, for NSTEMI that required revascularization with CABG. He was discharge to SNF with instruction to  hold entresto  and resume furosemide  on 08/13/23.  On the day of admission EMS was called to SNF due to patient having respiratory distress. He was placed on Cpap and transported to the ED.  On his initial physical examination his blood pressure was 162/85, HR 82, RR 19 and 25 and 02 saturation 94% on Cpap and supplemental 02.   Na 128, K 4,1 Cl 90, bicarbonate 27 glucose 207 bun 13 cr 0.73  BNP 488 High sensitive troponin 83, 82 and 80  Wbc 10,5 hgb 11.0 plt 559   Chest radiograph with cardiomegaly, bilateral hilar vascular congestion, bilateral central interstitial infiltrates. Small left pleural effusion, sternotomy wires in place,   EKG 89 bpm, left axis deviation, left bundle branch block, qtc 503 atrial fibrillation rhythm with no significant ST segment or T wave changes.   Ct chest with no evidence of pulmonary embolism. Nondisplaced right posterior 1st rib fracture. No pneumothorax.  Small bilateral pleural effusions, partially loculated on the left.   06/28 patient medically stable, plan to transfer to SNF on Monday.   Assessment and Plan: * Acute on chronic diastolic CHF (congestive heart failure) (HCC) Echocardiogram with preserved LV systolic function with EF 50 to 55%, mild LVH, moderate hypokinesis of the left  ventricular mid apical septal wall and apical segment. RV not well visualized, LA with mild dilatation, no significant valvular disease.   Urine output 800 cc Systolic blood pressure 120 mmHg.   Diuresis with po furosemide  40 mg po bid.  Continue carvedilol , SGLT 2 inh, and spironolactone   Sp Acetazolamide  500 mg bid x 2 doses   Acute cardiogenic pulmonary edema with left pleural effusion.  Left pleural effusion, sp thoracentesis 1.3 L  Chest radiograph with improvement in left pleural effusion, no fever and no leukocytosis, Continue to hold on antibiotic therapy  On steroids per CT surgery   Coronary artery disease involving native coronary artery of native heart without angina pectoris Recent CABG 05/27 to 08/10/23  No chest pain continue blood pressure control.   Essential hypertension, benign Continue blood pressure control with amlodipine , carvedilol  and spironolactone    Paroxysmal atrial fibrillation (HCC) Continue rate control with amiodarone   Anticoagulation with apixaban    Hyponatremia Renal function stable with serum cr at 1,0 with K at 4,3 and serum bicarbonate at 28  Na 130   Continue diuresis with furosemide , spironolactone  and SGLT 2 inh.  COPD (chronic obstructive pulmonary disease) (HCC) No signs of acute exacerbation,  Continue oxymetry monitoring   Sleep apnea Cpap   Type 2 diabetes mellitus with hyperglycemia, with long-term current use of insulin  (HCC) Continue glucose cover and monitoring with insulin  sliding scale  Basal insulin  20 units.  Fasting glucose today 254 mg/dl  Uncontrolled hyperglycemia   Generalized anxiety disorder Continue with alprazolam .         Subjective: patient has been  working with physical therapy no chest pain and no dyspnea, no PND or orthopnea   Physical Exam: Vitals:   08/25/23 0604 08/25/23 0621 08/25/23 0828 08/25/23 1154  BP: (!) 147/70  (!) 128/59 139/61  Pulse: 70   62  Resp: 15  18 18   Temp: 97.9 F  (36.6 C)  98 F (36.7 C) 97.9 F (36.6 C)  TempSrc: Oral   Oral  SpO2:   96% 100%  Weight:  112.1 kg    Height:       Neurology awake and alert ENT with mild pallor Cardiovascular with S1 and S2 present and regular with no gallops, rubs or murmurs Respiratory with no rales or wheezing, no rhonchi  Abdomen with no distention  No lower extremity edema   Data Reviewed:    Family Communication: no family at the bedside   Disposition: Status is: Inpatient Remains inpatient appropriate because: pending transfer to SNF   Planned Discharge Destination: Skilled nursing facility    Author: Elidia Toribio Furnace, MD 08/25/2023 2:44 PM  For on call review [email or web address].

## 2023-08-25 NOTE — Progress Notes (Signed)
 Rounding Note   Patient Name: [name]. Date of Encounter: 08/25/2023  [facility]: [facility]  Subjective No complaints  Scheduled Meds:  ALPRAZolam   0.25 mg Oral BID   amiodarone   400 mg Oral BID   amLODipine   5 mg Oral Daily   apixaban   5 mg Oral BID   aspirin  EC  81 mg Oral Daily   azelastine   1 spray Each Nare BID   carvedilol   12.5 mg Oral BID   empagliflozin   10 mg Oral Daily   feeding supplement  237 mL Oral BID BM   ferrous gluconate   324 mg Oral Q breakfast   fluticasone   2 spray Each Nare BID   furosemide   40 mg Oral BID   insulin  aspart  0-6 Units Subcutaneous TID WC   insulin  glargine-yfgn  20 Units Subcutaneous Daily   olopatadine   1 drop Both Eyes Q12H   polyethylene glycol  34 g Oral QHS   predniSONE   10 mg Oral 4X daily taper   senna-docusate  1 tablet Oral QHS   sodium chloride  flush  3 mL Intravenous Q12H   spironolactone   25 mg Oral Daily   terazosin   2 mg Oral QHS   Tiotropium Bromide  Monohydrate  2 puff Inhalation Daily   traZODone   100 mg Oral QHS   Continuous Infusions:  PRN Meds: acetaminophen  **OR** acetaminophen , hydrOXYzine , traMADol , trimethobenzamide    Vital Signs  Vitals:   08/25/23 0031 08/25/23 0604 08/25/23 0621 08/25/23 0828  BP:  (!) 147/70  (!) 128/59  Pulse:  70    Resp: 16 15  18   Temp:  97.9 F (36.6 C)  98 F (36.7 C)  TempSrc:  Oral    SpO2:    96%  Weight:   112.1 kg   Height:        Intake/Output Summary (Last 24 hours) at 08/25/2023 0917 Last data filed at 08/25/2023 0622 Gross per 24 hour  Intake 150 ml  Output 800 ml  Net -650 ml      08/25/2023    6:21 AM 08/24/2023    5:22 AM 08/23/2023    5:38 AM  Last 3 Weights  Weight (lbs) 247 lb 2.2 oz 252 lb 3.3 oz 250 lb 6.4 oz  Weight (kg) 112.1 kg 114.4 kg 113.581 kg      Telemetry NSR - Personally Reviewed  ECG  N/a - Personally Reviewed  Physical Exam  GEN: No acute distress.   Neck: No JVD Cardiac: RRR, no murmurs,  rubs, or gallops.  Respiratory: Clear to auscultation bilaterally. GI: Soft, nontender, non-distended  MS: No edema; No deformity. Neuro:  Nonfocal  Psych: Normal affect   Labs High Sensitivity Troponin:   Recent Labs  Lab 08/14/23 2200 08/15/23 0006 08/15/23 0912  TROPONINIHS 83* 82* 80*     Chemistry Recent Labs  Lab 08/21/23 0259 08/22/23 0323 08/23/23 0341 08/24/23 0901  NA 133* 131* 133* 128*  K 3.4* 3.7 3.8 4.2  CL 90* 93* 92* 90*  CO2 31 29 30 25   GLUCOSE 104* 119* 121* 167*  BUN 17 17 19 22   CREATININE 0.86 0.86 0.94 0.95  CALCIUM  8.5* 8.4* 9.0 9.0  MG 2.1 2.2 2.2  --   GFRNONAA >60 >60 >60 >60  ANIONGAP 12 9 11 13     Lipids No results for input(s): CHOL, TRIG, HDL, LABVLDL, LDLCALC, CHOLHDL in the last 168 hours.  Hematology Recent Labs  Lab 08/20/23 0414 08/22/23 0323 08/23/23 0341  WBC  7.9 8.2 8.0  RBC 3.90* 3.92* 4.31  HGB 11.1* 11.4* 12.5*  HCT 34.8* 34.7* 38.0*  MCV 89.2 88.5 88.2  MCH 28.5 29.1 29.0  MCHC 31.9 32.9 32.9  RDW 13.9 14.0 13.8  PLT 465* 468* 485*   Thyroid No results for input(s): TSH, FREET4 in the last 168 hours.  BNPNo results for input(s): BNP, PROBNP in the last 168 hours.  DDimer No results for input(s): DDIMER in the last 168 hours.   Radiology  No results found.  Cardiac Studies  Patient Profile   [name]. is a 73 y.o. male with a hx of CAD s/p CABG, PAF on eliquis , HFpEF, alcohol use, cannabis use, hx of tobacco use, HLD with statin intolerance, COPD, DM2, CKD2, OSA on CPAP, obesity, remote hx of ICH, provoked DVT. Admitted with A/D HFpEF 2/2 atrial flutter.   Assessment & Plan  1.Acute on chronic HFpEF - follows at [place], home regimen more suggesting of prior HFrEF - echo this admit LVEF 50-55%, indet diastolic function, elevated LA pressure -Angioedema to ACE and cough to losartan - on oral lasix  40mg  bid, I/Os incomplete. Weights reported as down 5 lbs from yesterday.   - denies  any symptoms   2. CAD - CABG 3 vessel 07/30/23   3. PAF/aflutter - on eliquis  for stroke preovention - s/p TEE/DCCV 08/20/23 - on amio 400mg  bid, coreg  12.5mg  bid. Per notes plans to transition to amio 200mg  daily at discharge.  For questions or updates, please contact [facility] Please consult [email or web address] for contact info under     Signed, Alvan Carrier, MD  08/25/2023, 9:17 AM

## 2023-08-25 NOTE — Plan of Care (Signed)
  Problem: Skin Integrity: Goal: Risk for impaired skin integrity will decrease Outcome: Progressing   Problem: Education: Goal: Knowledge of General Education information will improve Description: Including pain rating scale, medication(s)/side effects and non-pharmacologic comfort measures Outcome: Progressing   Problem: Health Behavior/Discharge Planning: Goal: Ability to manage health-related needs will improve Outcome: Progressing   Problem: Coping: Goal: Level of anxiety will decrease Outcome: Progressing

## 2023-08-25 NOTE — Progress Notes (Signed)
 Mobility Specialist Progress Note;   08/25/23 0956  Mobility  Activity Ambulated with assistance in hallway;Transferred from bed to chair  Level of Assistance Standby assist, set-up cues, supervision of patient - no hands on  Assistive Device Front wheel walker  Distance Ambulated (ft) 220 ft  RUE Weight Bearing Per Provider Order NWB  LUE Weight Bearing Per Provider Order NWB  Activity Response Tolerated well  Mobility Referral Yes  Mobility visit 1 Mobility  Mobility Specialist Start Time (ACUTE ONLY) 0956  Mobility Specialist Stop Time (ACUTE ONLY) 1010  Mobility Specialist Time Calculation (min) (ACUTE ONLY) 14 min   Pt eager for mobility. Required no physical assistance duirng ambulation, SV. Took 1x standing rest break. VSS throughout and no c/o when asked. Transferred pt to chair at [facility]. Pt left in chair with all needs met, alarm on.   Lauraine Erm Mobility Specialist Please contact via SecureChat or [employer] [phone]

## 2023-08-26 DIAGNOSIS — I480 Paroxysmal atrial fibrillation: Secondary | ICD-10-CM | POA: Diagnosis not present

## 2023-08-26 DIAGNOSIS — I5033 Acute on chronic diastolic (congestive) heart failure: Secondary | ICD-10-CM | POA: Diagnosis not present

## 2023-08-26 DIAGNOSIS — I2510 Atherosclerotic heart disease of native coronary artery without angina pectoris: Secondary | ICD-10-CM | POA: Diagnosis not present

## 2023-08-26 DIAGNOSIS — I10 Essential (primary) hypertension: Secondary | ICD-10-CM | POA: Diagnosis not present

## 2023-08-26 LAB — GLUCOSE, CAPILLARY
Glucose-Capillary: 163 mg/dL — ABNORMAL HIGH (ref 70–99)
Glucose-Capillary: 216 mg/dL — ABNORMAL HIGH (ref 70–99)
Glucose-Capillary: 251 mg/dL — ABNORMAL HIGH (ref 70–99)
Glucose-Capillary: 249 mg/dL — ABNORMAL HIGH (ref 70–99)

## 2023-08-26 MED ORDER — INSULIN GLARGINE-YFGN 100 UNIT/ML ~~LOC~~ SOLN: 20.0000 [IU] | Freq: Every day | SUBCUTANEOUS | 11 refills | Status: AC

## 2023-08-26 MED ORDER — FERROUS GLUCONATE 324 (38 FE) MG PO TABS: 324.0000 mg | ORAL_TABLET | Freq: Every day | ORAL | 0 refills | Status: AC

## 2023-08-26 MED ORDER — EMPAGLIFLOZIN 10 MG PO TABS: 10.0000 mg | ORAL_TABLET | Freq: Every day | ORAL | 0 refills | Status: DC

## 2023-08-26 MED ORDER — ALPRAZOLAM 0.25 MG PO TABS: 0.2500 mg | ORAL_TABLET | Freq: Two times a day (BID) | ORAL | 0 refills | Status: DC | PRN

## 2023-08-26 MED ORDER — FUROSEMIDE 40 MG PO TABS: 40.0000 mg | ORAL_TABLET | Freq: Two times a day (BID) | ORAL | 0 refills | Status: AC

## 2023-08-26 MED ORDER — AMIODARONE HCL 200 MG PO TABS: ORAL_TABLET | ORAL | 0 refills | Status: AC

## 2023-08-26 MED ORDER — PREDNISONE 10 MG (21) PO TBPK: ORAL_TABLET | ORAL | 0 refills | Status: DC

## 2023-08-26 MED ORDER — AMIODARONE HCL 200 MG PO TABS
200.0000 mg | ORAL_TABLET | Freq: Two times a day (BID) | ORAL | Status: DC
  Administered 2023-08-26 – 2023-08-27 (×3): 200 mg via ORAL
  Filled 2023-08-26 (×3): qty 1

## 2023-08-26 MED ORDER — ENSURE PLUS HIGH PROTEIN PO LIQD: 237.0000 mL | Freq: Two times a day (BID) | ORAL | 0 refills | Status: AC

## 2023-08-26 MED ORDER — SPIRONOLACTONE 25 MG PO TABS: 25.0000 mg | ORAL_TABLET | Freq: Every day | ORAL | 0 refills | Status: AC

## 2023-08-26 NOTE — Discharge Summary (Addendum)
 Physician Discharge Summary   Patient: [name]. MRN: [id number] DOB: 08-18-1950  Admit date:     08/14/2023  Discharge date: 08/27/23   Discharge Physician: Elidia Sieving Jamerion Cabello   PCP: [facility]   Recommendations at discharge:    Furosemide  increased to 40 mg bid, added spironolactone  and SGLT 2 inh. Continue carvedilol  and possible addition of ARB as outpatient if blood pressure stable Placed on amiodarone , continue oral load with 200 mg bid for 2 weeks and then continue with once daily.  Short course of taper steroids for inflammatory pleural effusion post CABG.  Follow up renal function and electrolytes in 7 days as outpatient Follow up with [facility] clinic in 7 to 10 after discharge from SNF Follow up with Cardiology as scheduled. Follow up with CT surgery as scheduled   Discharge Diagnoses: Principal Problem:   Acute on chronic diastolic CHF (congestive heart failure) (HCC) Active Problems:   Coronary artery disease involving native coronary artery of native heart without angina pectoris   Essential hypertension, benign   Paroxysmal atrial fibrillation (HCC)   Hyponatremia   COPD (chronic obstructive pulmonary disease) (HCC)   Sleep apnea   Type 2 diabetes mellitus with hyperglycemia, with long-term current use of insulin  (HCC)   Generalized anxiety disorder  Resolved Problems:   * No resolved hospital problems. *  Hospital Course: Mr.,Cruz was admitted to the hospital with the working diagnosis of heart failure decompensation   73 y.o. male with medical history significant for HTN, DM2, heart failure, paroxysmal atrial fibrillation. obesity, OSA, and CAD s/p CABG who presents from his SNF for evaluation of worsening dyspnea and lower extremity edema over the past several days,  Recent hospitalization 05/27 to 08/10/23, for NSTEMI that required revascularization with CABG. He was discharge to SNF with instruction to  hold entresto  and  resume furosemide  on 08/13/23.  On the day of admission EMS was called to SNF due to patient having respiratory distress. He was placed on Cpap and transported to the ED.  On his initial physical examination his blood pressure was 162/85, HR 82, RR 19 and 25 and 02 saturation 94% on Cpap and supplemental 02.   Na 128, K 4,1 Cl 90, bicarbonate 27 glucose 207 bun 13 cr 0.73  BNP 488 High sensitive troponin 83, 82 and 80  Wbc 10,5 hgb 11.0 plt 559   Chest radiograph with cardiomegaly, bilateral hilar vascular congestion, bilateral central interstitial infiltrates. Small left pleural effusion, sternotomy wires in place,   EKG 89 bpm, left axis deviation, left bundle branch block, qtc 503 atrial fibrillation rhythm with no significant ST segment or T wave changes.   Ct chest with no evidence of pulmonary embolism. Nondisplaced right posterior 1st rib fracture. No pneumothorax.  Small bilateral pleural effusions, partially loculated on the left.   06/28 patient medically stable, plan to transfer to SNF on Monday.  06/29 continue euvolemic and tolerating well heart failure regimen, plan for transfer to SNF on 08/27/23  Follow up as outpatient.   Assessment and Plan: * Acute on chronic diastolic CHF (congestive heart failure) (HCC) Echocardiogram with preserved LV systolic function with EF 50 to 55%, mild LVH, moderate hypokinesis of the left ventricular mid apical septal wall and apical segment. RV not well visualized, LA with mild dilatation, no significant valvular disease.   Patient was placed on IV furosemide  and oral acetazolamide  for diuresis, negative fluid balance was achieved, - 25,363 ml, with significant improvement in his symptoms.  Diuresis with po furosemide  40 mg po bid.  Continue carvedilol , SGLT 2 inh, and spironolactone    Acute cardiogenic pulmonary edema with left pleural effusion.  Left pleural effusion, sp thoracentesis 1.3 L  Follow up chest radiograph with improvement  in left pleural effusion. Pneumonia was ruled out  Short course of systemic steroids per CT surgery   Coronary artery disease involving native coronary artery of native heart without angina pectoris Recent CABG 05/27 to 08/10/23  No chest pain continue blood pressure control.   Essential hypertension, benign Continue blood pressure control with amlodipine , carvedilol  and spironolactone    Paroxysmal atrial fibrillation (HCC) Sp direct current cardioversion 08/20/23  Continue rate control with amiodarone , will continue oral load with 200 mg bid for 2 weeks and then transition to 200 mg daily.  Anticoagulation with apixaban    Hyponatremia Renal function stable with serum cr at 1,0 with K at 4,3 and serum bicarbonate at 28  Na 130   Continue diuresis with furosemide , spironolactone  and SGLT 2 inh.  COPD (chronic obstructive pulmonary disease) (HCC) No signs of acute exacerbation,  Continue oxymetry monitoring   Sleep apnea Cpap   Type 2 diabetes mellitus with hyperglycemia, with long-term current use of insulin  (HCC) Patient was placed on insulin  sliding scale and basal insulin  for glucose cover and monitoring  He developed uncontrolled hyperglycemia due to steroids   Generalized anxiety disorder Continue with alprazolam .          Consultants: Cardiology and CT surgery  Procedures performed: thoracentesis  Disposition: Skilled nursing facility Diet recommendation:  Cardiac and Carb modified diet DISCHARGE MEDICATION: Allergies as of 08/27/2023       Reactions   Ace Inhibitors Other (See Comments)   Angioedema per pt report to admiting MD On 07/31/2019 - lisinopril    Gabapentin Swelling, Other (See Comments)    Influenza-like illness   Peanut Oil Shortness Of Breath   Swelling throat, sob  Other reaction(s): anaphylaxis   Tamiflu [oseltamivir Phosphate] Shortness Of Breath   Losartan Swelling   Other reaction(s): Lip swelling   Lyrica [pregabalin] Swelling    Oxcarbazepine Swelling, Other (See Comments)   Lip swelling   Statins Other (See Comments)   Myalgia -- atorvastatin, simvastatin   Avelox [moxifloxacin Hcl In Nacl] Rash   Ciprofloxacin  Anxiety   Citalopram Anxiety   Other reaction(s): Drowsy, Anxiety   Victoza [liraglutide] Other (See Comments)   Pancreatitis per [facility]        Medication List     STOP taking these medications    insulin  glargine 100 UNIT/ML injection Commonly known as: LANTUS    oxyCODONE  5 MG immediate release tablet Commonly known as: Oxy IR/ROXICODONE    potassium gluconate 595 (99 K) MG Tabs tablet       TAKE these medications    acetaminophen  325 MG tablet Commonly known as: Tylenol  Take 2 tablets (650 mg total) by mouth every 6 (six) hours as needed for mild pain (pain score 1-3). What changed:  when to take this reasons to take this   ALPRAZolam  0.25 MG tablet Commonly known as: XANAX  Take 1 tablet (0.25 mg total) by mouth 2 (two) times daily as needed for anxiety. What changed:  when to take this reasons to take this   amiodarone  200 MG tablet Commonly known as: PACERONE  Take one tablet twice daily until July 01/2024, and then on July 13/2025, start taking one tablet daily   apixaban  5 MG Tabs tablet Commonly known as: ELIQUIS  Take 5 mg by mouth 2 (two)  times daily.   aspirin  EC 81 MG tablet Take 1 tablet (81 mg total) by mouth daily. Swallow whole.   Azelastine  HCl 137 MCG/SPRAY Soln Place 1 spray into both nostrils 2 (two) times daily.   carvedilol  12.5 MG tablet Commonly known as: COREG  Take 1 tablet (12.5 mg total) by mouth 2 (two) times daily.   CoQ10 100 MG Caps Take 100 mg by mouth daily.   cromolyn  4 % ophthalmic solution Commonly known as: OPTICROM  Place 1 drop into both eyes every 6 (six) hours as needed (itchy eyes).   empagliflozin  10 MG Tabs tablet Commonly known as: JARDIANCE  Take 1 tablet (10 mg total) by mouth daily.   feeding supplement Liqd Take 237 mLs  by mouth 2 (two) times daily between meals.   ferrous gluconate  324 MG tablet Commonly known as: FERGON Take 1 tablet (324 mg total) by mouth daily with breakfast.   fluticasone  50 MCG/ACT nasal spray Commonly known as: FLONASE  Place 2 sprays into both nostrils 2 (two) times daily.   furosemide  40 MG tablet Commonly known as: LASIX  Take 1 tablet (40 mg total) by mouth 2 (two) times daily. What changed:  medication strength how much to take when to take this   GLUCOSAMINE 1500 COMPLEX PO Take 1 tablet by mouth at bedtime.   hydrOXYzine  25 MG capsule Commonly known as: VISTARIL  Take 25 mg by mouth 2 (two) times daily as needed (sleep).   insulin  aspart 100 UNIT/ML injection Commonly known as: novoLOG  Inject 0-8 Units into the skin 3 (three) times daily before meals. Inject as per sliding scale: 0-150 = 0 units 151-200 = 2 units 201-250 = 3 units 251-300 = 4 units 301-350 = 6 units 351-400 = 8 units 401-999 = 8 units, Call MD   insulin  glargine-yfgn 100 UNIT/ML injection Commonly known as: SEMGLEE  Inject 0.2 mLs (20 Units total) into the skin daily.   loperamide 2 MG capsule Commonly known as: IMODIUM Take 1 capsule (2 mg total) by mouth every 8 (eight) hours as needed for diarrhea or loose stools.   MAGNESIUM  OXIDE PO Take 500 mg by mouth at bedtime.   multivitamin with minerals Tabs tablet Take 1 tablet by mouth every evening.   neomycin-polymyxin-hydrocortisone OTIC solution Commonly known as: CORTISPORIN Place 4 drops into the right ear 4 (four) times daily as needed (itchy ears).   olopatadine  0.1 % ophthalmic solution Commonly known as: PATANOL Place 1 drop into both eyes 2 (two) times daily.   polyethylene glycol powder 17 GM/SCOOP powder Commonly known as: GLYCOLAX /MIRALAX  Take 34 g by mouth daily as needed for mild constipation or moderate constipation. What changed:  when to take this reasons to take this   predniSONE  10 MG (21) Tbpk  tablet Commonly known as: STERAPRED UNI-PAK 21 TAB Take one tablet 4 times daily for one day, then one tablet 3 times per day for two days, then one tablet twicedaily for two days, then 1 tablet daily for two days, then half tablet daily for two days then stop.   PROBIOTIC DAILY PO Take 2 capsules by mouth daily.   Spiriva  Respimat 2.5 MCG/ACT Aers Generic drug: Tiotropium Bromide  Monohydrate Inhale 2 puffs into the lungs daily.   spironolactone  25 MG tablet Commonly known as: ALDACTONE  Take 1 tablet (25 mg total) by mouth daily.   terazosin  2 MG capsule Commonly known as: HYTRIN  Take 2 mg by mouth at bedtime.   traZODone  100 MG tablet Commonly known as: DESYREL  Take 100 mg by  mouth at bedtime.   vitamin B-12 500 MCG tablet Commonly known as: CYANOCOBALAMIN Take 500 mcg by mouth every evening.   Vitamin D 50 MCG (2000 UT) Caps Take 1 capsule by mouth at bedtime.        Follow-up Information     [facility] Follow up on 09/03/2023.   Specialty: Cardiology Why: at 11:30 am [facility], Entrance C Free Valet Parking Available Contact information: [address] [place]  [address] [phone]               Discharge Exam: [name]   08/23/23 0538 08/24/23 0522 08/25/23 9378  Weight: 113.6 kg 114.4 kg 112.1 kg   BP 135/70 (BP Location: Right Arm)   Pulse 65   Temp 98.1 F (36.7 C) (Oral)   Resp 18   Ht 6' 2 (1.88 m)   Wt 112.1 kg   SpO2 97%   BMI 31.73 kg/m   Patient is feeling better, no chest pain or dyspnea, edema has resolved he has been getting out of the bed to the chair  Neurology awake and alert ENT with mild pallor Cardiovascular with S1 and S2 present and regular with no gallops, rubs or murmurs Respiratory with no rales or wheezing, mild decreased breath sounds at bases Abdomen with no distention and non tender No lower extremity edema, ted hose in place    Condition at discharge: stable  The results of significant diagnostics from this hospitalization (including imaging, microbiology, ancillary and laboratory) are listed below for reference.   Imaging Studies: DG Chest 2 View Result Date: 08/23/2023 CLINICAL DATA:  Pleural effusion on left [id number] EXAM: CHEST - 2 VIEW COMPARISON:  08/22/2023 and chest CT 08/15/2023 FINDINGS: Heart and mediastinum are stable with post CABG changes. Small pleural densities along the lateral left lower chest most likely represent a small amount loculated pleural fluid. Small linear densities in the lower lungs probably represent atelectasis. No new airspace disease or lung consolidation. Negative for a pneumothorax. Evidence for an old right clavicle fracture. Degenerative changes in the left glenohumeral joint. IMPRESSION: 1. Small amount of loculated pleural fluid in the left lower chest. No significant change since 08/22/2023. 2. Mild bibasilar atelectasis. Electronically Signed   By: Juliene Balder M.D.   On: 08/23/2023 09:16   IR THORACENTESIS ASP PLEURAL SPACE W/IMG GUIDE Result Date: 08/22/2023 INDICATION: 73 year old male s/p CABG on 07/30/23 who recently presented to the ED with worsening shortness of breath. Imaging notable for bilateral pleural effusions L>R. Request for diagnostic and therapeutic left thoracentesis. EXAM: ULTRASOUND GUIDED Left THORACENTESIS MEDICATIONS: 1% lidocaine , 6 mL. COMPLICATIONS: None immediate. PROCEDURE: An ultrasound guided thoracentesis was thoroughly discussed with the patient and questions answered. The benefits, risks, alternatives and complications were also discussed. The patient understands and wishes to proceed with the procedure. Written consent was obtained. Ultrasound was performed to localize and mark an adequate pocket of fluid in the left chest. The area was then prepped and draped in the normal sterile fashion. 1% Lidocaine  was used for local anesthesia. Under ultrasound  guidance a 6 Fr Safe-T-Centesis catheter was introduced. Thoracentesis was performed. The catheter was removed and a dressing applied. FINDINGS: A total of approximately 1.3 L of dark amber fluid was removed. Samples were sent to the laboratory as requested by the clinical team. IMPRESSION: Successful ultrasound guided left thoracentesis yielding 1.3 L of pleural fluid. Procedure performed by: Sherrilee Bal, PA-C under the supervision of Dr.  KANDICE Banner Electronically Signed   By: Cordella Banner   On: 08/22/2023 12:42   DG Chest 1 View Result Date: 08/22/2023 CLINICAL DATA:  Post left thoracentesis. EXAM: CHEST  1 VIEW COMPARISON:  08/22/2023 and CT chest 08/15/2023. FINDINGS: Trachea is midline. Heart is enlarged, stable. Slight reduction in size of a loculated left pleural effusion. Streaky atelectasis in the left lung base. There may be tiny amount of loculated pleural fluid in the right hemithorax. No pneumothorax. IMPRESSION: 1. Small residual loculated left pleural effusion. No pneumothorax status post thoracentesis. 2. Left basilar atelectasis. 3. Probable small loculated right pleural effusion. Electronically Signed   By: Newell Eke M.D.   On: 08/22/2023 11:45   DG Chest 2 View Result Date: 08/22/2023 CLINICAL DATA:  Atelectasis, shortness of breath. EXAM: CHEST - 2 VIEW COMPARISON:  08/19/2023 and CT chest 08/15/2023. FINDINGS: Trachea is midline. Heart is enlarged, stable. Moderate partially loculated left pleural effusion with collapse/consolidation in the lingula and left lower lobe, unchanged. Small right pleural effusion. IMPRESSION: 1. Moderate partially loculated left pleural effusion with collapse/consolidation in the lingula and left lower lobe, likely due to atelectasis. Difficult to exclude pneumonia. 2. Small right pleural effusion. Electronically Signed   By: Newell Eke M.D.   On: 08/22/2023 09:20   ECHO TEE Result Date: 08/20/2023    TRANSESOPHOGEAL ECHO REPORT   Patient  Name:   [name]. Date of Exam: 08/20/2023 Medical Rec #:  [id number]           Height:       74.0 in Accession #:    [id number]          Weight:       260.1 lb Date of Birth:  [birth year]            BSA:          2.430 m Patient Age:    72 years            BP:           153/81 mmHg Patient Gender: M                   HR:           83 bpm. Exam Location:  Inpatient Procedure: Transesophageal Echo, Cardiac Doppler and Color Doppler (Both            Spectral and Color Flow Doppler were utilized during procedure). Indications:     A flutter  History:         Patient has prior history of Echocardiogram examinations, most                  recent 08/15/2023. CAD, COPD; Risk Factors:Hypertension.  Sonographer:     Philomena Daring Referring Phys:  [id number] JON GARRE DUKE Diagnosing Phys: [name] MD PROCEDURE: After discussion of the risks and benefits of a TEE, an informed consent was obtained from the patient. The transesophogeal probe was passed without difficulty through the esophogus of the patient. Imaged were obtained with the patient in a left lateral decubitus position. Sedation performed by different physician. The patient was monitored while under deep sedation. Anesthestetic sedation was provided intravenously by Anesthesiology: 260mg  of Propofol . Image quality was good. The patient developed no complications during the procedure. A successful direct current cardioversion was performed at 200 joules with 1 attempt.  IMPRESSIONS  1. Left ventricular ejection fraction, by estimation, is 60 to 65%. The left ventricle has  normal function. The left ventricle has no regional wall motion abnormalities.  2. Right ventricular systolic function is normal. The right ventricular size is normal.  3. Left atrial appendage previously clipped with small residual. Left atrial size was moderately dilated. No left atrial/left atrial appendage thrombus was detected.  4. Right atrial size was mildly dilated.  5. The  mitral valve is normal in structure. Mild mitral valve regurgitation.  6. Left coronary cusp is fixed. The aortic valve is tricuspid. Aortic valve regurgitation is trivial.  7. There is mild (Grade II) plaque involving the descending aorta. FINDINGS  Left Ventricle: Left ventricular ejection fraction, by estimation, is 60 to 65%. The left ventricle has normal function. The left ventricle has no regional wall motion abnormalities. The left ventricular internal cavity size was normal in size. Right Ventricle: The right ventricular size is normal. Right ventricular systolic function is normal. Left Atrium: Left atrial appendage previously clipped with small residual. Left atrial size was moderately dilated. No left atrial/left atrial appendage thrombus was detected. Right Atrium: Right atrial size was mildly dilated. Pericardium: Trivial pericardial effusion is present. Mitral Valve: The mitral valve is normal in structure. Mild mitral annular calcification. Mild mitral valve regurgitation. Tricuspid Valve: The tricuspid valve is normal in structure. Tricuspid valve regurgitation is mild. Aortic Valve: Left coronary cusp is fixed. The aortic valve is tricuspid. Aortic valve regurgitation is trivial. Pulmonic Valve: The pulmonic valve was normal in structure. Pulmonic valve regurgitation is mild. Aorta: The aortic root is normal in size and structure. There is mild (Grade II) plaque involving the descending aorta. IAS/Shunts: No atrial level shunt detected by color flow Doppler.   AORTA Ao Root diam: 3.20 cm Ao Asc diam:  3.20 cm [name] MD Electronically signed by [name] MD Signature Date/Time: 08/20/2023/12:26:35 PM    Final    EP STUDY Result Date: 08/20/2023 See surgical note for result.  DG Chest 2 View Result Date: 08/19/2023 CLINICAL DATA:  CABG. EXAM: CHEST - 2 VIEW COMPARISON:  08/17/2023 FINDINGS: Sternotomy wires unchanged. Lungs are adequately inflated demonstrate stable to slight  interval worsening of left base opacification likely moderate effusion with associated Lexis. Infection in the left base is possible. Stable cardiomegaly. Remainder of the exam is unchanged. IMPRESSION: 1. Stable to slight interval worsening of left base opacification likely moderate effusion with associated atelectasis. Infection in the left base is possible. 2. Stable cardiomegaly. Electronically Signed   By: Toribio Agreste M.D.   On: 08/19/2023 13:47   DG Chest 2 View Result Date: 08/17/2023 CLINICAL DATA:  Dyspnea and diastolic heart failure EXAM: CHEST - 2 VIEW COMPARISON:  08/14/2023 FINDINGS: Median sternotomy for CABG.  Valve repair. Midline trachea. Cardiomegaly accentuated by AP portable technique. Small bilateral pleural effusions. No pneumothorax. Mild interstitial edema is not significantly changed. Left greater than right base airspace disease is similar given differences in technique. IMPRESSION: 1. Cardiomegaly with similar mild congestive heart failure. 2. Similar bibasilar airspace disease, favoring atelectasis. Electronically Signed   By: Rockey Kilts M.D.   On: 08/17/2023 13:22   ECHOCARDIOGRAM COMPLETE Result Date: 08/15/2023    ECHOCARDIOGRAM REPORT   Patient Name:   [name]. Date of Exam: 08/15/2023 Medical Rec #:  [id number]           Height:       74.0 in Accession #:    [id number]          Weight:       271.9 lb Date  of Birth:  10-Mar-1950            BSA:          2.477 m Patient Age:    72 years            BP:           122/68 mmHg Patient Gender: M                   HR:           85 bpm. Exam Location:  Inpatient Procedure: 2D Echo, Color Doppler and Cardiac Doppler (Both Spectral and Color            Flow Doppler were utilized during procedure). Indications:    Dyspnea  History:        Patient has prior history of Echocardiogram examinations, most                 recent 07/25/2023. Risk Factors:Diabetes and Hypertension.  Sonographer:    Benard Stallion Referring Phys: [id number]  MYRON G RODDENBERRY IMPRESSIONS  1. Left ventricular ejection fraction, by estimation, is 50 to 55%. The left ventricle has low normal function. The left ventricle demonstrates regional wall motion abnormalities (see scoring diagram/findings for description). There is mild left ventricular hypertrophy. Left ventricular diastolic parameters are indeterminate. Elevated left ventricular end-diastolic pressure. There is moderate hypokinesis of the left ventricular, mid-apical septal wall and apical segment.  2. Right ventricular systolic function was not well visualized. The right ventricular size is not well visualized.  3. Left atrial size was mildly dilated.  4. The mitral valve is degenerative. Trivial mitral valve regurgitation. No evidence of mitral stenosis. The mean mitral valve gradient is 4.0 mmHg. Severe mitral annular calcification.  5. The aortic valve was not well visualized. Aortic valve regurgitation is not visualized. Aortic valve sclerosis/calcification is present, without any evidence of aortic stenosis. Aortic valve mean gradient measures 7.0 mmHg.  6. Rhythm strip during this exam demonstrates atrial flutter. Comparison(s): Changes from prior study are noted. 07/25/2023: LVEF 55-60%. FINDINGS  Left Ventricle: Left ventricular ejection fraction, by estimation, is 50 to 55%. The left ventricle has low normal function. The left ventricle demonstrates regional wall motion abnormalities. Moderate hypokinesis of the left ventricular, mid-apical septal wall and apical segment. There is mild left ventricular hypertrophy. Left ventricular diastolic function could not be evaluated due to atrial flutter. Left ventricular diastolic parameters are indeterminate. Elevated left ventricular end-diastolic  pressure.  LV Wall Scoring: The apical septal segment, apical anterior segment, and apex are hypokinetic. Right Ventricle: The right ventricular size is not well visualized. Right vetricular wall thickness was not  well visualized. Right ventricular systolic function was not well visualized. Left Atrium: Left atrial size was mildly dilated. Right Atrium: Right atrial size was normal in size. Pericardium: There is no evidence of pericardial effusion. Mitral Valve: The mitral valve is degenerative in appearance. Severe mitral annular calcification. Trivial mitral valve regurgitation. No evidence of mitral valve stenosis. MV peak gradient, 12.8 mmHg. The mean mitral valve gradient is 4.0 mmHg. Tricuspid Valve: The tricuspid valve is grossly normal. Tricuspid valve regurgitation is trivial. Aortic Valve: The aortic valve was not well visualized. Aortic valve regurgitation is not visualized. Aortic valve sclerosis/calcification is present, without any evidence of aortic stenosis. Aortic valve mean gradient measures 7.0 mmHg. Aortic valve peak gradient measures 13.1 mmHg. Aortic valve area, by VTI measures 2.42 cm. Pulmonic Valve: The pulmonic valve was normal in structure. Pulmonic valve  regurgitation is not visualized. Aorta: The aortic root and ascending aorta are structurally normal, with no evidence of dilitation. IAS/Shunts: No atrial level shunt detected by color flow Doppler. EKG: Rhythm strip during this exam demonstrates atrial flutter.  LEFT VENTRICLE PLAX 2D LVIDd:         4.60 cm      Diastology LVIDs:         2.90 cm      LV e' medial:    4.68 cm/s LV PW:         1.20 cm      LV E/e' medial:  38.2 LV IVS:        1.30 cm      LV e' lateral:   6.64 cm/s LVOT diam:     2.30 cm      LV E/e' lateral: 27.0 LV SV:         84 LV SV Index:   34 LVOT Area:     4.15 cm  LV Volumes (MOD) LV vol d, MOD A2C: 102.0 ml LV vol d, MOD A4C: 87.1 ml LV vol s, MOD A2C: 48.0 ml LV vol s, MOD A4C: 36.8 ml LV SV MOD A2C:     54.0 ml LV SV MOD A4C:     87.1 ml LV SV MOD BP:      52.3 ml RIGHT VENTRICLE RV S prime:     6.53 cm/s TAPSE (M-mode): 2.0 cm LEFT ATRIUM             Index        RIGHT ATRIUM           Index LA Vol (A2C):   99.6 ml  40.22 ml/m  RA Area:     22.50 cm LA Vol (A4C):   79.6 ml 32.14 ml/m  RA Volume:   63.20 ml  25.52 ml/m LA Biplane Vol: 90.3 ml 36.46 ml/m  AORTIC VALVE AV Area (Vmax):    2.52 cm AV Area (Vmean):   2.30 cm AV Area (VTI):     2.42 cm AV Vmax:           181.00 cm/s AV Vmean:          122.000 cm/s AV VTI:            0.349 m AV Peak Grad:      13.1 mmHg AV Mean Grad:      7.0 mmHg LVOT Vmax:         110.00 cm/s LVOT Vmean:        67.500 cm/s LVOT VTI:          0.203 m LVOT/AV VTI ratio: 0.58 MITRAL VALVE                TRICUSPID VALVE MV Area (PHT): 3.99 cm     TR Peak grad:   21.0 mmHg MV Area VTI:   1.68 cm     TR Vmax:        229.00 cm/s MV Peak grad:  12.8 mmHg MV Mean grad:  4.0 mmHg     SHUNTS MV Vmax:       1.79 m/s     Systemic VTI:  0.20 m MV Vmean:      86.3 cm/s    Systemic Diam: 2.30 cm MV Decel Time: 190 msec MV E velocity: 179.00 cm/s MV A velocity: 88.60 cm/s MV E/A ratio:  2.02 Vinie Maxcy MD Electronically signed by Vinie Maxcy MD Signature Date/Time: 08/15/2023/5:22:44 PM  Final    CT Angio Chest PE W and/or Wo Contrast Result Date: 08/15/2023 EXAM: CTA CHEST AORTA 08/15/2023 12:22:29 AM TECHNIQUE: CTA of the chest was performed after the administration of intravenous contrast. Multiplanar reformatted images are provided for review. MIP images are provided for review. Automated exposure control, iterative reconstruction, and/or weight based adjustment of the mA/kV was utilized to reduce the radiation dose to as low as reasonably achievable. COMPARISON: Chest radiograph yesterday and CTA chest dated 07/25/2008. CLINICAL HISTORY: Pulmonary embolism (PE) suspected, high prob. 75cc [id number]; SOB. Pt had a CABG last week on Monday and started having SOB today. Pt was taken off his fluid pill per him. Pt has edema all over. Pt placed on CPAP by EMS for increased work of breathing and swallow resps. FINDINGS: AORTA: No thoracic aortic dissection. No aneurysm. Thoracic aortic atherosclerosis.  MEDIASTINUM: No mediastinal lymphadenopathy. The heart and pericardium demonstrate no acute abnormality. Postsurgical changes related to prior CABG. Left atrial appendage clip. Moderate 3-vessel coronary atherosclerosis. LYMPH NODES: No mediastinal, hilar or axillary lymphadenopathy. LUNGS AND PLEURA: No evidence of pulmonary embolism. Small bilateral pleural effusions, partially loculated on the left. No frank interstitial edema. Compressive atelectasis of the bilateral lower lobes. No pneumothorax. UPPER ABDOMEN: Status post cholecystectomy. Atherosclerotic calcifications of the abdominal aorta. SOFT TISSUES AND BONES: Nondisplaced right posterior first rib fracture (image 16). Median sternotomy. Mild degenerative changes of the visualized thoracolumbar spine. IMPRESSION: 1. No evidence of pulmonary embolism. 2. Nondisplaced right posterior 1st rib fracture. No pneumothorax. 3. Small bilateral pleural effusions, partially loculated on the left. No frank interstitial edema. Electronically signed by: Pinkie Pebbles MD 08/15/2023 12:39 AM EDT RP Workstation: [id number]   DG Chest Portable 1 View Result Date: 08/14/2023 CLINICAL DATA:  sob EXAM: PORTABLE CHEST 1 VIEW COMPARISON:  Chest x-ray 08/09/2023, chest x-ray 08/01/2023 FINDINGS: Stable cardiomediastinal enlargement. The heart and mediastinal contours are unchanged. Left atrial appendage clip. No focal consolidation. Persistent mild pulmonary edema. Possible trace left pleural effusion. No pneumothorax. No acute osseous abnormality.  Sternotomy wires are intact. IMPRESSION: 1. Persistent mild pulmonary edema. 2. Possible trace left pleural effusion. Electronically Signed   By: Morgane  Naveau M.D.   On: 08/14/2023 22:26   DG Chest 2 View Result Date: 08/09/2023 CLINICAL DATA:  [id number].  Status post coronary artery bypass graft. EXAM: CHEST - 2 VIEW COMPARISON:  Portable chest 08/07/2023 at 6:05 a.m. FINDINGS: 5:07 a.m. There is persisting patchy  consolidation or atelectasis in the left lower lung field, unchanged but with improvement in prior opacities in the peripheral left upper lung field. Right lung remains clear. Small left pleural effusion is similar. Stable mediastinum with aortic atherosclerosis and tortuosity. Again noted CABG change, left atrial appendage clip. Stable cardiomegaly with normal caliber central vessels. IMPRESSION: 1. Persisting patchy consolidation or atelectasis in the left lower lung field, unchanged but with improvement in prior opacities in the peripheral left upper lung field. 2. Small left pleural effusion is similar.  No new abnormality. 3. Stable cardiomegaly. Electronically Signed   By: Francis Quam M.D.   On: 08/09/2023 05:31   DG CHEST PORT 1 VIEW Result Date: 08/07/2023 CLINICAL DATA:  [id number].  Status post CABG. EXAM: PORTABLE CHEST 1 VIEW COMPARISON:  Portable chest 08/04/2023 FINDINGS: There is patchy multifocal left-sided airspace disease, slightly improved today. Right lung remains clear. There is a small left pleural effusion. The overall aeration seems unchanged apart from slightly improved left-sided opacities. Stable sternotomy and CABG changes with cardiomegaly. No vascular congestion is  seen. The mediastinum is stable with mild widening and aortic atherosclerosis. Left atrial appendage clip. Pneumothorax is seen.  There are no new osseous findings. IMPRESSION: 1. Patchy multifocal left-sided airspace disease, slightly improved today. 2. Small left pleural effusion with no other changes in overall aeration. 3. Stable cardiomegaly.  CABG change. 4. Aortic atherosclerosis. Electronically Signed   By: Francis Quam M.D.   On: 08/07/2023 07:46   DG CHEST PORT 1 VIEW Result Date: 08/04/2023 CLINICAL DATA:  73 year old male status post CABG postoperative day 5. EXAM: PORTABLE CHEST 1 VIEW COMPARISON:  08/02/2023 chest radiographs and earlier. FINDINGS: Portable AP semi upright view at 0525 hours. Stable  cardiomegaly and mediastinal contours. Postoperative changes to the mediastinum. Left lung base ventilation appears mildly improved, small veiling opacity may persist. No pneumothorax, pulmonary edema, consolidation. Visualized tracheal air column is within normal limits. Stable visualized osseous structures. IMPRESSION: Small left pleural effusion may have regressed. Stable cardiomegaly and postoperative mediastinum. No new cardiopulmonary abnormality. Electronically Signed   By: VEAR Hurst M.D.   On: 08/04/2023 08:36   ECHO INTRAOPERATIVE TEE Result Date: 08/02/2023  *INTRAOPERATIVE TRANSESOPHAGEAL REPORT *  Patient Name:   [name]. Date of Exam: 07/30/2023 Medical Rec #:  [id number]           Height:       74.0 in Accession #:    [id number]          Weight:       263.2 lb Date of Birth:  02/02/1951            BSA:          2.44 m Patient Age:    72 years            BP:           147/58 mmHg Patient Gender: M                   HR:           61 bpm. Exam Location:  Anesthesiology Transesophogeal exam was perform intraoperatively during surgical procedure. Patient was closely monitored under general anesthesia during the entirety of examination. Indications:     CAD Performing Phys: [id number] STEVEN C HENDRICKSON Complications: No known complications during this procedure. POST-OP IMPRESSIONS _ Left Ventricle: The left ventricle is unchanged from pre-bypass. has normal systolic function. The cavity size was normal. _ Right Ventricle: The right ventricle appears unchanged from pre-bypass The cavity was normal. _ Aorta: The aorta appears unchanged from pre-bypass. _ Left Atrial Appendage: Has been surgically closed and the repair appears to be oversewn. _ Aortic Valve: The aortic valve appears unchanged from pre-bypass. There is no regurgitation. No regurgitation post repair. The gradient recorded across the prosthetic valve is within the expected range. _ Mitral Valve: The mitral valve appears unchanged from  pre-bypass. There is no regurgitation. No regurgitation post repair. The gradient recorded across the prosthetic valve is within the expected range. _ Tricuspid Valve: The tricuspid valve appears unchanged from pre-bypass. There is no regurgitation. No regurgitation post repair. The gradient recorded across the prosthetic valve is within the expected range. _ Pulmonic Valve: The pulmonic valve appears unchanged from pre-bypass. _ Interatrial Septum: The interatrial septum appears unchanged from pre-bypass. PRE-OP FINDINGS  Left Ventricle: The left ventricle has normal systolic function, with an ejection fraction of 55-60%. The cavity size was normal. There is moderately increased left ventricular wall thickness. There is moderate concentric left ventricular hypertrophy.  Right Ventricle: The right ventricle has normal systolic function. The cavity was normal. There is increased right ventricular wall thickness. Left Atrium: Left atrial size was normal in size. No left atrial/left atrial appendage thrombus was detected. Right Atrium: Right atrial size was normal in size. Interatrial Septum: No atrial level shunt detected by color flow Doppler. Pericardium: A small pericardial effusion is present. Mitral Valve: The mitral valve is normal in structure. Mitral valve regurgitation is mild by color flow Doppler. There is Mild mitral stenosis. There is severe thickening and severe calcifcation present on the mitral valve posterior cusp with severely decreased mobility and there is mild thickening and mild calcification present on the mitral valve anterior cusp with normal mobility. Tricuspid Valve: The tricuspid valve was normal in structure. Tricuspid valve regurgitation is mild by color flow Doppler. There is no evidence of tricuspid valve vegetation. Aortic Valve: The aortic valve is tricuspid Aortic valve regurgitation is trivial by color flow Doppler. There is mild stenosis of the aortic valve. There is no evidence of  aortic valve vegetation. There is severe thickening and severe calcifcation present on the aortic valve left coronary cusp with severely decreased mobility. Pulmonic Valve: The pulmonic valve was normal in structure, with normal. The gradient recorded across the pulmonic valve is within the expected range. Pulmonic valve regurgitation is trivial by color flow Doppler. Aorta: There is evidence of plaque in the descending aorta; Grade I, measuring 1-25mm in size. Venous: The inferior vena cava is normal in size with greater than 50% respiratory variability, suggesting right atrial pressure of 3 mmHg. +-------------+------------++ AORTIC VALVE              +-------------+------------++ AV Vmax:     184.00 cm/s  +-------------+------------++ AV Vmean:    125.000 cm/s +-------------+------------++ AV VTI:      0.492 m      +-------------+------------++ AV Peak Grad:13.5 mmHg    +-------------+------------++ AV Mean Grad:13.0 mmHg    +-------------+------------++ +-------------+---------++ MITRAL VALVE           +-------------+---------++ MV Peak grad:6.4 mmHg  +-------------+---------++ MV Mean grad:3.0 mmHg  +-------------+---------++ MV Vmax:     1.26 m/s  +-------------+---------++ MV Vmean:    71.3 cm/s +-------------+---------++ MV VTI:      0.42 m    +-------------+---------++  Norleen Pope MD Electronically signed by Norleen Pope MD Signature Date/Time: 08/02/2023/4:59:25 PM    Final    DG Chest 2 View Result Date: 08/02/2023 CLINICAL DATA:  Status post CABG. EXAM: CHEST - 2 VIEW COMPARISON:  08/01/2023 FINDINGS: Postoperative changes from median sternotomy and CABG procedure. Stable cardiomediastinal contours. Retrocardiac opacification is unchanged. Persistent left pleural effusion. The visualized osseous structures are intact. IMPRESSION: 1. No change in retrocardiac opacification and left pleural effusion. 2. Status post CABG procedure. Electronically Signed    By: Waddell Calk M.D.   On: 08/02/2023 07:16   DG Chest Port 1 View Result Date: 08/01/2023 CLINICAL DATA:  Status post CABG EXAM: PORTABLE CHEST 1 VIEW COMPARISON:  July 31, 2023 FINDINGS: Swan-Ganz removed Chest tube and mediastinal drains removed Mild bilateral residual interstitial prominence correlate with some residual congestive changes with basilar hypoventilatory atelectasis and small left pleural reaction and effusion. No pneumothorax Heart and mediastinum within normal limits IMPRESSION: Mild residual congestive changes with basilar hypoventilatory atelectasis and small left pleural reaction and effusion. Electronically Signed   By: Franky Chard M.D.   On: 08/01/2023 08:34   DG Chest Port 1 View Result Date: 07/31/2023 CLINICAL DATA:  Post CABG EXAM: PORTABLE CHEST 1 VIEW COMPARISON:  July 30, 2023 FINDINGS: Extubated, nasogastric tube removed Tip of the Swan-Ganz in the outflow portion of the main pulmonary artery Mediastinal drain and left chest tube remains in place without evidence of pneumothorax with minimal left lower lobe hypoventilatory atelectasis No significant pleural effusions IMPRESSION: *Extubated, nasogastric tube removed. *Minimal left lower lobe hypoventilatory atelectasis. Electronically Signed   By: Franky Chard M.D.   On: 07/31/2023 08:28   DG Chest Port 1 View Result Date: 07/30/2023 CLINICAL DATA:  Status post CABG. EXAM: PORTABLE CHEST 1 VIEW COMPARISON:  Radiograph 07/27/2023 FINDINGS: Interval median sternotomy and CABG. Patient is rotated. Endotracheal tube tip 5.6 cm from the carina. Enteric tube tip below the diaphragm in the stomach, side-port just beyond the gastroesophageal junction. Right internal jugular Swan-Ganz catheter tip is in the region of the main pulmonary outflow tract. Mediastinal drain and left chest tube. Left atrial clipping versus overlying artifact. Stable heart size. Suspected left pleural effusion and atelectasis. There is vascular congestion. No  visible pneumothorax. IMPRESSION: 1. Interval median sternotomy and CABG. 2. Support apparatus as described. 3. Suspected left pleural effusion and atelectasis. Vascular congestion. Electronically Signed   By: Andrea Gasman M.D.   On: 07/30/2023 20:02   VAS US  DOPPLER PRE CABG Result Date: 07/30/2023 PREOPERATIVE VASCULAR EVALUATION Patient Name:  [name]  Date of Exam:   07/29/2023 Medical Rec #: [id number]        Accession #:    [id number] Date of Birth: Jul 27, 1950         Patient Gender: M Patient Age:   17 years Exam Location:  [facility] Procedure:      VAS US  DOPPLER PRE CABG Referring Phys: --------------------------------------------------------------------------------  Indications:      Pre-CABG. Risk Factors:     Hypertension, hyperlipidemia, Diabetes, past history of                   smoking, prior MI, coronary artery disease. Other Factors:    Paroxysmal atrial fibrillation. Limitations:      Body habitus, plaque morphology, talking, multiple phone call                   interruptions Comparison Study: Prior carotid duplex at [facility] done 12/27/2012 indicating                   50-69% right ICA stenosis and no stenosis in the left ICA.                   Patient states he had recent carotid at the [facility] indicating high                   grade stenosis, however, there are no records available. Performing Technologist: Rachel Pellet RVS  Examination Guidelines: A complete evaluation includes B-mode imaging, spectral Doppler, color Doppler, and power Doppler as needed of all accessible portions of each vessel. Bilateral testing is considered an integral part of a complete examination. Limited examinations for reoccurring indications may be performed as noted.  Right Carotid Findings: +----------+--------+--------+--------+------------+--------+           PSV cm/sEDV cm/sStenosisDescribe    Comments +----------+--------+--------+--------+------------+--------+ CCA Prox  65      11               heterogenous         +----------+--------+--------+--------+------------+--------+ CCA Distal52      9  heterogenous         +----------+--------+--------+--------+------------+--------+ ICA Prox  226     68      60-79%  calcific             +----------+--------+--------+--------+------------+--------+ ICA Mid   190     33                                   +----------+--------+--------+--------+------------+--------+ ICA Distal140     41                                   +----------+--------+--------+--------+------------+--------+ ECA       189     12              calcific             +----------+--------+--------+--------+------------+--------+ +----------+--------+-------+--------+------------+           PSV cm/sEDV cmsDescribeArm Pressure +----------+--------+-------+--------+------------+ Subclavian73                     133          +----------+--------+-------+--------+------------+ +---------+--------+--+--------+--+ VertebralPSV cm/s63EDV cm/s22 +---------+--------+--+--------+--+ Left Carotid Findings: +----------+--------+--------+--------+----------------------+---------+           PSV cm/sEDV cm/sStenosisDescribe              Comments  +----------+--------+--------+--------+----------------------+---------+ CCA Prox  59      10              heterogenous                    +----------+--------+--------+--------+----------------------+---------+ CCA Distal45      8               heterogenous                    +----------+--------+--------+--------+----------------------+---------+ ICA Prox  250     56      60-79%  irregular and calcificShadowing +----------+--------+--------+--------+----------------------+---------+ ICA Mid   159     37                                              +----------+--------+--------+--------+----------------------+---------+ ICA Distal103     22                                               +----------+--------+--------+--------+----------------------+---------+ ECA       163     14              calcific                        +----------+--------+--------+--------+----------------------+---------+ +----------+--------+--------+--------+------------+ SubclavianPSV cm/sEDV cm/sDescribeArm Pressure +----------+--------+--------+--------+------------+           265                     174          +----------+--------+--------+--------+------------+ +---------+--------+--+--------+--+ VertebralPSV cm/s62EDV cm/s16 +---------+--------+--+--------+--+  ABI Findings: +------------------+-----+-----------+ Rt Pressure (mmHg)IndexWaveform    +------------------+-----+-----------+ 133  triphasic   +------------------+-----+-----------+ 156               0.90 multiphasic +------------------+-----+-----------+ 156               0.90 multiphasic +------------------+-----+-----------+ 139               0.80 Normal      +------------------+-----+-----------+ +------------------+-----+-----------+ Lt Pressure (mmHg)IndexWaveform    +------------------+-----+-----------+ 174                    triphasic   +------------------+-----+-----------+ 177               1.02 multiphasic +------------------+-----+-----------+ 164               0.94 multiphasic +------------------+-----+-----------+ 139               0.80 Normal      +------------------+-----+-----------+ +-------+---------------+ ABI/TBIToday's ABI/TBI +-------+---------------+ Right  0.90/0.80       +-------+---------------+ Left   1.02/0.80       +-------+---------------+  Right Doppler Findings: +--------+--------+---------+ Site    PressureDoppler   +--------+--------+---------+ [id number]     triphasic +--------+--------+---------+ Radial          triphasic +--------+--------+---------+ Ulnar           triphasic  +--------+--------+---------+  Left Doppler Findings: +--------+--------+---------+ Site    PressureDoppler   +--------+--------+---------+ [id number]     triphasic +--------+--------+---------+ Radial          triphasic +--------+--------+---------+ Ulnar           triphasic +--------+--------+---------+   Summary: Right Carotid: Velocities in the right ICA are consistent with a 60-79%                stenosis. Left Carotid: Velocities in the left ICA are consistent with a 60-79% stenosis. Vertebrals:  Bilateral vertebral arteries demonstrate antegrade flow. Subclavians: Normal flow hemodynamics were seen in bilateral subclavian              arteries. Right ABI: Resting right ankle-brachial index indicates mild right lower extremity arterial disease. The right toe-brachial index is normal. Left ABI: Resting left ankle-brachial index is within normal range. The left toe-brachial index is normal. Right Upper Extremity: Doppler waveforms remain within normal limits with right radial compression. Doppler waveforms decrease <50% with right ulnar compression. Left Upper Extremity: Doppler waveform obliterate with left radial compression. Doppler waveforms remain within normal limits with left ulnar compression.  Electronically signed by Norman Serve on 07/30/2023 at 3:48:28 PM.    Final    DG Chest 2 View Result Date: 07/27/2023 CLINICAL DATA:  Pulmonary edema EXAM: CHEST - 2 VIEW COMPARISON:  07/24/2023 FINDINGS: Opacity overlying the right peripheral lung base appears new from prior examination and may represent an object overlying the patient. Previously noted consolidation within the left lung base has resolved. Lungs are otherwise clear. No pneumothorax or pleural effusion. Cardiac size is within normal limits. IMPRESSION: 1. Interval resolution of left basilar consolidation. 2. New opacity overlying the right lung base may represent an object overlying the patient. Attention on follow-up.  Electronically Signed   By: Dorethia Molt M.D.   On: 07/27/2023 22:32    Microbiology: Results for orders placed or performed during the hospital encounter of 07/24/23  Surgical pcr screen     Status: None   Collection Time: 07/30/23  7:47 AM   Specimen: Nasal Mucosa; Nasal Swab  Result Value Ref Range Status   MRSA, PCR NEGATIVE NEGATIVE Final  Staphylococcus aureus NEGATIVE NEGATIVE Final    Comment: (NOTE) The Xpert SA Assay (FDA approved for NASAL specimens in patients 80 years of age and older), is one component of a comprehensive surveillance program. It is not intended to diagnose infection nor to guide or monitor treatment. Performed at [facility], [address], [place], [place] [address]   Urine Culture (for pregnant, neutropenic or urologic patients or patients with an indwelling urinary catheter)     Status: Abnormal   Collection Time: 08/08/23  6:16 PM   Specimen: Urine, Clean Catch  Result Value Ref Range Status   Specimen Description URINE, CLEAN CATCH  Final   Special Requests NONE  Final   Culture (A)  Final    <10,000 COLONIES/mL INSIGNIFICANT GROWTH Performed at [facility], [address] [place], [place] [address]    Report Status 08/10/2023 FINAL  Final    Labs: CBC: Recent Labs  Lab 08/20/23 0414 08/22/23 0323 08/23/23 0341  WBC 7.9 8.2 8.0  NEUTROABS  --  6.1  --   HGB 11.1* 11.4* 12.5*  HCT 34.8* 34.7* 38.0*  MCV 89.2 88.5 88.2  PLT 465* 468* 485*   Basic Metabolic Panel: Recent Labs  Lab 08/21/23 0259 08/22/23 0323 08/23/23 0341 08/24/23 0901 08/25/23 1124  NA 133* 131* 133* 128* 130*  K 3.4* 3.7 3.8 4.2 4.3  CL 90* 93* 92* 90* 92*  CO2 31 29 30 25 28   GLUCOSE 104* 119* 121* 167* 254*  BUN 17 17 19 22  29*  CREATININE 0.86 0.86 0.94 0.95 1.04  CALCIUM  8.5* 8.4* 9.0 9.0 9.1  MG 2.1 2.2 2.2  --   --    Liver Function Tests: No results for input(s): AST, ALT, ALKPHOS, BILITOT, PROT, ALBUMIN  in the  last 168 hours. CBG: Recent Labs  Lab 08/25/23 1150 08/25/23 1600 08/25/23 2145 08/26/23 0631 08/26/23 1248  GLUCAP 253* 209* 246* 163* 216*    Discharge time spent: greater than 30 minutes.  Signed: Elidia Toribio Furnace, MD [facility] 08/26/2023

## 2023-08-26 NOTE — Progress Notes (Signed)
 Rounding Note   Patient Name: Caulder Wehner. Date of Encounter: 08/26/2023  [facility]  Subjective No complaints  Scheduled Meds:  ALPRAZolam   0.25 mg Oral BID   amiodarone   400 mg Oral BID   amLODipine   5 mg Oral Daily   apixaban   5 mg Oral BID   aspirin  EC  81 mg Oral Daily   azelastine   1 spray Each Nare BID   carvedilol   12.5 mg Oral BID   empagliflozin   10 mg Oral Daily   feeding supplement  237 mL Oral BID BM   ferrous gluconate   324 mg Oral Q breakfast   fluticasone   2 spray Each Nare BID   furosemide   40 mg Oral BID   insulin  aspart  0-6 Units Subcutaneous TID WC   insulin  glargine-yfgn  20 Units Subcutaneous Daily   olopatadine   1 drop Both Eyes Q12H   polyethylene glycol  34 g Oral QHS   predniSONE   10 mg Oral 4X daily taper   senna-docusate  1 tablet Oral QHS   sodium chloride  flush  3 mL Intravenous Q12H   spironolactone   25 mg Oral Daily   terazosin   2 mg Oral QHS   Tiotropium Bromide  Monohydrate  2 puff Inhalation Daily   traZODone   100 mg Oral QHS   Continuous Infusions:  PRN Meds: acetaminophen  **OR** acetaminophen , hydrOXYzine , traMADol , trimethobenzamide    Vital Signs  Vitals:   08/25/23 0828 08/25/23 1154 08/25/23 1533 08/25/23 2006  BP: (!) 128/59 139/61 (!) 126/54 (!) 146/63  Pulse:  62 63 68  Resp: 18 18 18 20   Temp: 98 F (36.7 C) 97.9 F (36.6 C) 97.7 F (36.5 C) 97.8 F (36.6 C)  TempSrc:  Oral Oral Oral  SpO2: 96% 100% 100% 95%  Weight:      Height:        Intake/Output Summary (Last 24 hours) at 08/26/2023 0825 Last data filed at 08/26/2023 0650 Gross per 24 hour  Intake --  Output 1900 ml  Net -1900 ml      08/25/2023    6:21 AM 08/24/2023    5:22 AM 08/23/2023    5:38 AM  Last 3 Weights  Weight (lbs) 247 lb 2.2 oz 252 lb 3.3 oz 250 lb 6.4 oz  Weight (kg) 112.1 kg 114.4 kg 113.581 kg      Telemetry NSR - Personally Reviewed  ECG  N/a - Personally Reviewed  Physical Exam  GEN:  No acute distress.   Neck: No JVD Cardiac: RRR, no murmurs, rubs, or gallops.  Respiratory: Clear to auscultation bilaterally. GI: Soft, nontender, non-distended  MS: No edema; No deformity. Neuro:  Nonfocal  Psych: Normal affect   Labs High Sensitivity Troponin:   Recent Labs  Lab 08/14/23 2200 08/15/23 0006 08/15/23 0912  TROPONINIHS 83* 82* 80*     Chemistry Recent Labs  Lab 08/21/23 0259 08/22/23 0323 08/23/23 0341 08/24/23 0901 08/25/23 1124  NA 133* 131* 133* 128* 130*  K 3.4* 3.7 3.8 4.2 4.3  CL 90* 93* 92* 90* 92*  CO2 31 29 30 25 28   GLUCOSE 104* 119* 121* 167* 254*  BUN 17 17 19 22  29*  CREATININE 0.86 0.86 0.94 0.95 1.04  CALCIUM  8.5* 8.4* 9.0 9.0 9.1  MG 2.1 2.2 2.2  --   --   GFRNONAA >60 >60 >60 >60 >60  ANIONGAP 12 9 11 13 10     Lipids No results for input(s): CHOL, TRIG, HDL, LABVLDL, LDLCALC,  CHOLHDL in the last 168 hours.  Hematology Recent Labs  Lab 08/20/23 0414 08/22/23 0323 08/23/23 0341  WBC 7.9 8.2 8.0  RBC 3.90* 3.92* 4.31  HGB 11.1* 11.4* 12.5*  HCT 34.8* 34.7* 38.0*  MCV 89.2 88.5 88.2  MCH 28.5 29.1 29.0  MCHC 31.9 32.9 32.9  RDW 13.9 14.0 13.8  PLT 465* 468* 485*   Thyroid No results for input(s): TSH, FREET4 in the last 168 hours.  BNPNo results for input(s): BNP, PROBNP in the last 168 hours.  DDimer No results for input(s): DDIMER in the last 168 hours.   Radiology  No results found.    Patient Profile   Rowland Ericsson. is a 73 y.o. male with a hx of CAD s/p CABG, PAF on eliquis , HFpEF, alcohol use, cannabis use, hx of tobacco use, HLD with statin intolerance, COPD, DM2, CKD2, OSA on CPAP, obesity, remote hx of ICH, provoked DVT. Admitted with A/D HFpEF 2/2 atrial flutter.   Assessment & Plan  1.Acute on chronic HFpEF - follows at [facility] - echo this admit LVEF 50-55%, indet diastolic function, elevated LA pressure -Angioedema to ACE and cough to losartan. - he is on jardiance  10mg  daily - on  oral lasix  40mg  bid, I/Os incomplete. Weights pending this AM, were down yesterday by 5 lbs.  - denies any symptoms     2. CAD - CABG 3 vessel 07/30/23     3. PAF/aflutter - on eliquis  for stroke preovention - s/p TEE/DCCV 08/20/23 - on amio 400mg  bid, coreg  12.5mg  bid. Has received 7 days of amio 400mg  bid, transition to 200mg  bid x 2 weeks then 200mg  daily.  - tele shows maintaining NSR.   4. HTN - he reports LE edema on norvasc  in the past, we have discontinued   Ok for transfer to SNF from cardiac standpoint No additional cardiology recs, we will sign off inpatient care.   For questions or updates, please contact [facility] Please consult [email or web address] for contact info under     Signed, Alvan Carrier, MD  08/26/2023, 8:25 AM

## 2023-08-26 NOTE — Progress Notes (Signed)
 Mobility Specialist Progress Note;   08/26/23 1140  Mobility  Activity Ambulated with assistance in hallway  Level of Assistance Contact guard assist, steadying assist  Assistive Device Four wheel walker  Distance Ambulated (ft) 200 ft  RUE Weight Bearing Per Provider Order NWB  LUE Weight Bearing Per Provider Order NWB  Activity Response Tolerated well  Mobility Referral Yes  Mobility visit 1 Mobility  Mobility Specialist Start Time (ACUTE ONLY) 1140  Mobility Specialist Stop Time (ACUTE ONLY) 1153  Mobility Specialist Time Calculation (min) (ACUTE ONLY) 13 min   Pt eager for mobility, in chair upon arrival. Required MinG assistance to boost from chair, SV during ambulation. Took 1x seated rest break d/t SOB, VSS on RA. Requested to use BSC once returned to room. Pt left on [facility] w/ call bell in reach once finished.   Lauraine Erm Mobility Specialist Please contact via SecureChat or [employer] [phone]

## 2023-08-27 LAB — RENAL FUNCTION PANEL
Albumin: 3.4 g/dL — ABNORMAL LOW (ref 3.5–5.0)
Anion gap: 12 (ref 5–15)
BUN: 30 mg/dL — ABNORMAL HIGH (ref 8–23)
CO2: 30 mmol/L (ref 22–32)
Calcium: 9.4 mg/dL (ref 8.9–10.3)
Chloride: 92 mmol/L — ABNORMAL LOW (ref 98–111)
Creatinine, Ser: 1.03 mg/dL (ref 0.61–1.24)
GFR, Estimated: >60 mL/min (ref >60)
Glucose, Bld: 242 mg/dL — ABNORMAL HIGH (ref 70–99)
Phosphorus: 3.5 mg/dL (ref 2.5–4.6)
Potassium: 4.2 mmol/L (ref 3.5–5.1)
Sodium: 134 mmol/L — ABNORMAL LOW (ref 135–145)

## 2023-08-27 LAB — GLUCOSE, CAPILLARY: Glucose-Capillary: 157 mg/dL — ABNORMAL HIGH (ref 70–99)

## 2023-08-27 MED ORDER — LOPERAMIDE HCL 2 MG PO CAPS
2.0000 mg | ORAL_CAPSULE | Freq: Once | ORAL | Status: AC
  Administered 2023-08-27: 2 mg via ORAL
  Filled 2023-08-27: qty 1

## 2023-08-27 MED ORDER — LOPERAMIDE HCL 2 MG PO CAPS: 2.0000 mg | ORAL_CAPSULE | Freq: Three times a day (TID) | ORAL | 0 refills | Status: AC | PRN

## 2023-08-27 MED ORDER — LOPERAMIDE HCL 2 MG PO CAPS: 2.0000 mg | ORAL_CAPSULE | Freq: Three times a day (TID) | ORAL | Status: DC | PRN

## 2023-08-27 MED ORDER — POLYETHYLENE GLYCOL 3350 17 GM/SCOOP PO POWD: 34.0000 g | Freq: Every day | ORAL | 0 refills | Status: AC | PRN

## 2023-08-27 NOTE — Progress Notes (Signed)
 Patient had diarrhea this morning, but no chest pain or dyspnea  BP 134/68 (BP Location: Right Arm)   Pulse 70   Temp 98.1 F (36.7 C) (Oral)   Resp (!) 23   Ht 6' 2 (1.88 m)   Wt 109.9 kg   SpO2 99%   BMI 31.11 kg/m   Neurology awake and alert ENT with mild pallor Cardiovascular with S1 and S2 present and regular Respiratory with no rales or wheezing Abdomen with no distention  No lower extremity edema  Acute on chronic heart failure, diastolic, is stable.  Plan to transfer to SNF today.

## 2023-08-27 NOTE — TOC Transition Note (Addendum)
 Transition of Care [facility]) - Discharge Note   Patient Details  Name: [name]. MRN: [id number] Date of Birth: 03/12/1950  Transition of [facility] CM/SW Contact:  Arlana JINNY Nicholaus ISRAEL Phone Number: [phone] 08/27/2023, 10:22 AM   Clinical Narrative:   [facility] CSW notified the bedside RN that the patients auth was approved and to notify CSW when PTAR can be called.   CSW placed DC summary paperwork on chart.  The patients room #210 and the number to call for report is [phone].   11:05AM- PTAR called at.       Barriers to Discharge: Continued Medical Work up   Patient Goals and CMS Choice            Discharge Placement                       Discharge Plan and Services Additional resources added to the After Visit Summary for                                       Social Drivers of Health (SDOH) Interventions SDOH Screenings   Food Insecurity: No Food Insecurity (08/15/2023)  Housing: Low Risk  (08/15/2023)  Transportation Needs: No Transportation Needs (08/15/2023)  Utilities: Not At Risk (08/15/2023)  Social Connections: Moderately Integrated (08/15/2023)  Tobacco Use: Medium Risk (08/20/2023)     Readmission Risk Interventions     No data to display

## 2023-09-02 DIAGNOSIS — R197 Diarrhea, unspecified: Secondary | ICD-10-CM | POA: Diagnosis not present

## 2023-09-02 DIAGNOSIS — I2510 Atherosclerotic heart disease of native coronary artery without angina pectoris: Secondary | ICD-10-CM | POA: Diagnosis not present

## 2023-09-02 DIAGNOSIS — E1151 Type 2 diabetes mellitus with diabetic peripheral angiopathy without gangrene: Secondary | ICD-10-CM | POA: Diagnosis not present

## 2023-09-02 DIAGNOSIS — G4733 Obstructive sleep apnea (adult) (pediatric): Secondary | ICD-10-CM | POA: Diagnosis not present

## 2023-09-03 ENCOUNTER — Encounter (HOSPITAL_COMMUNITY)

## 2023-09-04 ENCOUNTER — Ambulatory Visit (HOSPITAL_COMMUNITY)

## 2023-09-04 ENCOUNTER — Ambulatory Visit: Payer: Self-pay | Admitting: Thoracic Surgery (Cardiothoracic Vascular Surgery)

## 2023-09-10 ENCOUNTER — Other Ambulatory Visit: Payer: Self-pay | Admitting: Thoracic Surgery (Cardiothoracic Vascular Surgery)

## 2023-09-10 DIAGNOSIS — Z951 Presence of aortocoronary bypass graft: Secondary | ICD-10-CM

## 2023-09-11 ENCOUNTER — Ambulatory Visit: Payer: Self-pay | Admitting: Thoracic Surgery (Cardiothoracic Vascular Surgery)

## 2023-09-11 ENCOUNTER — Ambulatory Visit (HOSPITAL_COMMUNITY)

## 2023-09-13 ENCOUNTER — Ambulatory Visit: Payer: Self-pay

## 2023-09-14 NOTE — Progress Notes (Signed)
 [address] [name] [address]             [phone]       HPI: [name] is a 73 year old man with medical history of hypertension, NSTEMI, CHF, CAD, paroxysmal afib, type 2 dm, sleep apnea and COPD. He returns for routine postoperative follow-up having undergone Coronary artery bypass grafting x3, Left internal mammary artery to the LAD, Saphenous vein graft to OM1, Saphenous vein graft to posterior descending with coronary endarterectomy,Endoscopic vein harvest right leg, Left atrial appendage clip with 45 mm [employer] P[employer] clip on 07/30/2023 with Dr. Kerrin. The patient's early postoperative recovery while in the hospital was notable for volume overload but patient was able to be dieresis with Zaroxolyn  5mg  which transitioned to PO lasix . Lasix  had to be discontinued to to acute on chronic hyponatremia. He was able to dieresis independently.  He was discharged in stable condition to a skilled nursing facility on 08/10/2023. He was readmitted on 08/14/2023 for shortness of breath.  He had oral and IV dieresis which improved his symptoms. Also had thoracentesis with short course of steroids for left pleural effusion.  He was then discharged in stable condition on 08/27/2023.  Since hospital discharge the patient reports that he has been doing well.  He is at home with home health care.  He reports that he does have some incisional chest pain that is improved with tylenol  during the day.  He is still using tramadol  at night for the pain.  He denies shortness of breath and lower leg swelling.  He is having some constipation from iron  pills. He has been using miralax  for constipation.     Current Outpatient Medications  Medication Sig Dispense Refill   acetaminophen  (TYLENOL ) 325 MG tablet Take 2 tablets (650 mg total) by mouth every 6 (six) hours as needed for mild pain (pain score 1-3). (Patient taking differently: Take 650 mg by mouth every 4  (four) hours as needed for mild pain (pain score 1-3) or moderate pain (pain score 4-6).)     Alirocumab (PRALUENT) 150 MG/ML SOAJ Inject 150 mg into the skin every 14 (fourteen) days.     ALPRAZolam  (XANAX ) 0.25 MG tablet Take 1 tablet (0.25 mg total) by mouth 2 (two) times daily as needed for anxiety. 10 tablet 0   amiodarone  (PACERONE ) 200 MG tablet Take one tablet twice daily until July 01/2024, and then on July 13/2025, start taking one tablet daily 42 tablet 0   apixaban  (ELIQUIS ) 5 MG TABS tablet Take 5 mg by mouth 2 (two) times daily.     aspirin  EC 81 MG tablet Take 1 tablet (81 mg total) by mouth daily. Swallow whole.     Azelastine  HCl 137 MCG/SPRAY SOLN Place 1 spray into both nostrils 2 (two) times daily.      carvedilol  (COREG ) 12.5 MG tablet Take 1 tablet (12.5 mg total) by mouth 2 (two) times daily.     Cholecalciferol (VITAMIN D) 50 MCG (2000 UT) CAPS Take 1 capsule by mouth at bedtime.     Coenzyme Q10 (COQ10) 100 MG CAPS Take 100 mg by mouth daily.     cromolyn  (OPTICROM ) 4 % ophthalmic solution Place 1 drop into both eyes every 6 (six) hours as needed (itchy eyes).     empagliflozin  (JARDIANCE ) 10 MG TABS tablet Take 1 tablet (10 mg total) by mouth daily. 30 tablet 0   feeding supplement (  ENSURE PLUS HIGH PROTEIN) LIQD Take 237 mLs by mouth 2 (two) times daily between meals. 14220 mL 0   ferrous gluconate  (FERGON) 324 MG tablet Take 1 tablet (324 mg total) by mouth daily with breakfast. 30 tablet 0   fluticasone  (FLONASE ) 50 MCG/ACT nasal spray Place 2 sprays into both nostrils 2 (two) times daily.     furosemide  (LASIX ) 40 MG tablet Take 1 tablet (40 mg total) by mouth 2 (two) times daily. 30 tablet 0   Glucosamine-Chondroit-Vit C-Mn (GLUCOSAMINE 1500 COMPLEX PO) Take 1 tablet by mouth at bedtime.     hydrOXYzine  (VISTARIL ) 25 MG capsule Take 25 mg by mouth 2 (two) times daily as needed (sleep).     insulin  aspart (NOVOLOG ) 100 UNIT/ML injection Inject 0-8 Units into the skin 3  (three) times daily before meals. Inject as per sliding scale: 0-150 = 0 units 151-200 = 2 units 201-250 = 3 units 251-300 = 4 units 301-350 = 6 units 351-400 = 8 units 401-999 = 8 units, Call MD     insulin  glargine-yfgn (SEMGLEE ) 100 UNIT/ML injection Inject 0.2 mLs (20 Units total) into the skin daily. 10 mL 11   losartan (COZAAR) 25 MG tablet Take 25 mg by mouth daily.     MAGNESIUM  OXIDE PO Take 500 mg by mouth at bedtime.     Multiple Vitamin (MULTIVITAMIN WITH MINERALS) TABS Take 1 tablet by mouth every evening.      neomycin-polymyxin-hydrocortisone (CORTISPORIN) OTIC solution Place 4 drops into the right ear 4 (four) times daily as needed (itchy ears).     olopatadine  (PATANOL) 0.1 % ophthalmic solution Place 1 drop into both eyes 2 (two) times daily.     polyethylene glycol powder (GLYCOLAX /MIRALAX ) 17 GM/SCOOP powder Take 34 g by mouth daily as needed for mild constipation or moderate constipation. 255 g 0   potassium chloride  (MICRO-K ) 10 MEQ CR capsule Take 10 mEq by mouth 2 (two) times daily.     predniSONE  (STERAPRED UNI-PAK 21 TAB) 10 MG (21) TBPK tablet Take one tablet 4 times daily for one day, then one tablet 3 times per day for two days, then one tablet twicedaily for two days, then 1 tablet daily for two days, then half tablet daily for two days then stop. 17 tablet 0   Probiotic Product (PROBIOTIC DAILY PO) Take 2 capsules by mouth daily.     spironolactone  (ALDACTONE ) 25 MG tablet Take 1 tablet (25 mg total) by mouth daily. 30 tablet 0   terazosin  (HYTRIN ) 2 MG capsule Take 2 mg by mouth at bedtime.     Tiotropium Bromide  Monohydrate (SPIRIVA  RESPIMAT) 2.5 MCG/ACT AERS Inhale 2 puffs into the lungs daily.     traZODone  (DESYREL ) 100 MG tablet Take 100 mg by mouth at bedtime.     vitamin B-12 (CYANOCOBALAMIN) 500 MCG tablet Take 500 mcg by mouth every evening.     loperamide  (IMODIUM ) 2 MG capsule Take 1 capsule (2 mg total) by mouth every 8 (eight) hours as needed for  diarrhea or loose stools. (Patient not taking: Reported on 09/17/2023) 10 capsule 0   No current facility-administered medications for this visit.   Vitals:   09/17/23 1323  BP: 123/77  Pulse: 73  Resp: 18  SpO2: 98%   Review of Systems  Constitutional:  Negative for chills and fever.  Respiratory:  Negative for cough and shortness of breath.   Cardiovascular:  Negative for chest pain and leg swelling.  Gastrointestinal:  Positive for constipation.  Neurological:  Negative for headaches.     Physical Exam: Physical Exam Constitutional:      Appearance: Normal appearance.  HENT:     Head: Normocephalic and atraumatic.  Cardiovascular:     Rate and Rhythm: Normal rate and regular rhythm.     Heart sounds: Normal heart sounds, S1 normal and S2 normal.  Pulmonary:     Effort: Pulmonary effort is normal.     Breath sounds: Normal breath sounds.  Skin:    General: Skin is warm and dry.         Comments: Incision sites on chest and right lower leg without swelling, erythema or drainage    Neurological:     General: No focal deficit present.     Mental Status: He is alert and oriented to person, place, and time.      Diagnostic Tests: CLINICAL DATA:  Coronary artery bypass grafting   EXAM: CHEST - 2 VIEW   COMPARISON:  Chest x-ray performed August 23, 2023   FINDINGS: Postsurgical changes from coronary artery bypass grafting. Heart mediastinum are not significantly changed. No focal infiltrate, pleural effusion, or pneumothorax. Basilar scarring is favored.   IMPRESSION: 1. Postsurgical changes from coronary artery bypass grafting.     Electronically Signed   By: Maude Naegeli M.D.   On: 09/17/2023 13:42   Plan: We reviewed today's chest x ray. No left sided plural effusion is present at this time.  Patient without shortness of breath and lower leg swelling.  He can discontinue iron  at this time due to constipation. We discussed driving and he is able to continue  to drive as long as not taking pain medications. He would like to participate in cardiac rehab and order is placed. He is 8 weeks from surgery so patient can stop sternal precautions at this time.  Discussed slowly increasing activity and to be cautious with lifting heavier objects/certain exercises. [facility] and [facility] some [facility] to his [facility].  He is not on Entresto  and is taking losartan 25 mg.  He is still using amiodarone  and lasix .  He is to continue to follow up with his cardiologist for medication management.   Follow up with [facility] as needed.      Manuelita CHRISTELLA Rough, PA-C [facility] [phone]

## 2023-09-17 ENCOUNTER — Ambulatory Visit (HOSPITAL_COMMUNITY)
Admission: RE | Admit: 2023-09-17 | Discharge: 2023-09-17 | Disposition: A | Discharge status: Home / Self Care | Source: Ambulatory Visit | Attending: Thoracic Surgery (Cardiothoracic Vascular Surgery) | Admitting: Thoracic Surgery (Cardiothoracic Vascular Surgery)

## 2023-09-17 ENCOUNTER — Ambulatory Visit: Payer: Self-pay

## 2023-09-17 VITALS — BP 123/77 | HR 73 | Resp 18 | Ht 74.0 in | Wt 243.0 lb

## 2023-09-17 DIAGNOSIS — Z951 Presence of aortocoronary bypass graft: Secondary | ICD-10-CM | POA: Insufficient documentation

## 2023-09-17 NOTE — Patient Instructions (Signed)
 Follow up with office as needed.  Continue with cardiology appointments

## 2023-09-24 ENCOUNTER — Telehealth (HOSPITAL_COMMUNITY): Payer: Self-pay

## 2023-09-24 NOTE — Telephone Encounter (Signed)
 Called patient to see if he was interested in participating in the Cardiac Rehab Program. Patient will come in for orientation on 8/14 and will attend the 1:45 exercise class.  Sent MyChart message.

## 2023-09-26 DIAGNOSIS — Z961 Presence of intraocular lens: Secondary | ICD-10-CM | POA: Diagnosis not present

## 2023-09-26 DIAGNOSIS — E119 Type 2 diabetes mellitus without complications: Secondary | ICD-10-CM | POA: Diagnosis not present

## 2023-09-26 DIAGNOSIS — H43813 Vitreous degeneration, bilateral: Secondary | ICD-10-CM | POA: Diagnosis not present

## 2023-10-10 ENCOUNTER — Encounter (HOSPITAL_COMMUNITY): Payer: Self-pay

## 2023-10-11 ENCOUNTER — Encounter (HOSPITAL_COMMUNITY)
Admission: RE | Admit: 2023-10-11 | Discharge: 2023-10-11 | Disposition: A | Discharge status: Home / Self Care | Source: Ambulatory Visit | Attending: Cardiovascular Disease | Admitting: Cardiovascular Disease

## 2023-10-11 VITALS — BP 114/72 | HR 63 | Ht 73.75 in | Wt 244.9 lb

## 2023-10-11 DIAGNOSIS — Z951 Presence of aortocoronary bypass graft: Secondary | ICD-10-CM | POA: Diagnosis present

## 2023-10-11 LAB — GLUCOSE, CAPILLARY: Glucose-Capillary: 107 mg/dL — ABNORMAL HIGH (ref 70–99)

## 2023-10-11 NOTE — Progress Notes (Signed)
 Cardiac Individual Treatment Plan  Patient Details  Name: [name]. 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  Patient's Home Medications on Admission:  Current Outpatient Medications:    acetaminophen  (TYLENOL ) 325 MG tablet, Take 2 tablets (650 mg total) by mouth every 6 (six) hours as needed for mild pain (pain score 1-3)., Disp: , Rfl:    Alirocumab (PRALUENT) 150 MG/ML SOAJ, Inject 150 mg into the skin every 14 (fourteen) days., Disp: , Rfl:    amiodarone  (PACERONE ) 200 MG tablet, Take one tablet twice daily until July 01/2024, and then on July 13/2025, start taking one tablet daily, Disp: 42 tablet, Rfl: 0   apixaban  (ELIQUIS ) 5 MG TABS tablet, Take 5 mg by mouth 2 (two) times daily., Disp: , Rfl:    aspirin  EC 81 MG tablet, Take 1 tablet (81 mg total) by mouth daily. Swallow whole., Disp: , Rfl:    Azelastine  HCl 137 MCG/SPRAY SOLN, Place 1 spray into both nostrils 2 (two) times daily. , Disp: , Rfl:    carvedilol  (COREG ) 12.5 MG tablet, Take 1 tablet (12.5 mg total) by mouth 2 (two) times daily., Disp: , Rfl:    Cholecalciferol (VITAMIN D) 50 MCG (2000 UT) CAPS, Take 1 capsule by mouth at bedtime., Disp: , Rfl:    Coenzyme Q10 (COQ10) 100 MG CAPS, Take 100 mg by mouth daily., Disp: , Rfl:    cromolyn  (OPTICROM ) 4 % ophthalmic solution, Place 1 drop into both eyes every 6 (six) hours as needed (itchy eyes)., Disp: , Rfl:    fluticasone  (FLONASE ) 50 MCG/ACT nasal spray, Place 2 sprays into both nostrils 2 (two) times daily., Disp: , Rfl:    furosemide  (LASIX ) 40 MG  tablet, Take 1 tablet (40 mg total) by mouth 2 (two) times daily. (Patient taking differently: Take 20 mg by mouth daily.), Disp: 30 tablet, Rfl: 0   Glucosamine-Chondroit-Vit C-Mn (GLUCOSAMINE 1500 COMPLEX PO), Take 1 tablet by mouth at bedtime., Disp: , Rfl:    hydrOXYzine  (VISTARIL ) 25 MG capsule, Take 25 mg by mouth 2 (two) times daily as needed (sleep)., Disp: , Rfl:    insulin  aspart (NOVOLOG ) 100 UNIT/ML injection, Inject 0-8 Units into the skin 3 (three) times daily before meals. Inject as per sliding scale: 0-150 = 0 units 151-200 = 2 units 201-250 = 3 units 251-300 = 4 units 301-350 = 6 units 351-400 = 8 units 401-999 = 8 units, Call MD, Disp: , Rfl:    insulin  glargine-yfgn (SEMGLEE ) 100 UNIT/ML injection, Inject 0.2 mLs (20 Units total) into the skin daily., Disp: 10 mL, Rfl: 11   MAGNESIUM  OXIDE PO, Take 500 mg by mouth at bedtime., Disp: , Rfl:    Multiple Vitamin (MULTIVITAMIN WITH MINERALS) TABS, Take 1 tablet by mouth every evening. , Disp: , Rfl:    neomycin-polymyxin-hydrocortisone (CORTISPORIN) OTIC solution, Place 4 drops into the right ear 4 (four) times daily as needed (itchy ears)., Disp: , Rfl:    olopatadine  (PATANOL) 0.1 % ophthalmic solution, Place 1 drop into both eyes 2 (two) times daily., Disp: , Rfl:  polyethylene glycol powder (GLYCOLAX /MIRALAX ) 17 GM/SCOOP powder, Take 34 g by mouth daily as needed for mild constipation or moderate constipation., Disp: 255 g, Rfl: 0   potassium chloride  (MICRO-K ) 10 MEQ CR capsule, Take 10 mEq by mouth 2 (two) times daily. (Patient taking differently: Take 10 mEq by mouth daily.), Disp: , Rfl:    terazosin  (HYTRIN ) 2 MG capsule, Take 2 mg by mouth at bedtime., Disp: , Rfl:    Tiotropium Bromide  Monohydrate (SPIRIVA  RESPIMAT) 2.5 MCG/ACT AERS, Inhale 2 puffs into the lungs daily., Disp: , Rfl:    traZODone  (DESYREL ) 100 MG tablet, Take 100 mg by mouth at bedtime., Disp: , Rfl:    vitamin B-12 (CYANOCOBALAMIN) 500 MCG tablet, Take 500  mcg by mouth every evening., Disp: , Rfl:    ALPRAZolam  (XANAX ) 0.25 MG tablet, Take 1 tablet (0.25 mg total) by mouth 2 (two) times daily as needed for anxiety. (Patient not taking: Reported on 10/11/2023), Disp: 10 tablet, Rfl: 0   empagliflozin  (JARDIANCE ) 10 MG TABS tablet, Take 1 tablet (10 mg total) by mouth daily. (Patient not taking: Reported on 10/11/2023), Disp: 30 tablet, Rfl: 0   feeding supplement (ENSURE PLUS HIGH PROTEIN) LIQD, Take 237 mLs by mouth 2 (two) times daily between meals. (Patient not taking: Reported on 10/11/2023), Disp: 14220 mL, Rfl: 0   ferrous gluconate  (FERGON) 324 MG tablet, Take 1 tablet (324 mg total) by mouth daily with breakfast. (Patient not taking: Reported on 10/11/2023), Disp: 30 tablet, Rfl: 0   loperamide  (IMODIUM ) 2 MG capsule, Take 1 capsule (2 mg total) by mouth every 8 (eight) hours as needed for diarrhea or loose stools. (Patient not taking: Reported on 09/17/2023), Disp: 10 capsule, Rfl: 0   losartan (COZAAR) 25 MG tablet, Take 25 mg by mouth daily. (Patient not taking: Reported on 10/11/2023), Disp: , Rfl:    predniSONE  (STERAPRED UNI-PAK 21 TAB) 10 MG (21) TBPK tablet, Take one tablet 4 times daily for one day, then one tablet 3 times per day for two days, then one tablet twicedaily for two days, then 1 tablet daily for two days, then half tablet daily for two days then stop. (Patient not taking: Reported on 10/11/2023), Disp: 17 tablet, Rfl: 0   Probiotic Product (PROBIOTIC DAILY PO), Take 2 capsules by mouth daily. (Patient not taking: Reported on 10/11/2023), Disp: , Rfl:    spironolactone  (ALDACTONE ) 25 MG tablet, Take 1 tablet (25 mg total) by mouth daily. 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   Neuromuscular disorder (HCC)    neuropathy   Non-toxic multinodular goiter 07/28/2023   Other psoriasis 07/28/2023   Paroxysmal atrial fibrillation (HCC) 09/22/2021   Peripheral vascular disease (HCC) 2006   dvt 's   Personal history of colon polyps, unspecified 07/28/2023  Phlebitis and thrombophlebitis of lower extremities 07/28/2023   Aug 28, 2008 Entered By: POLLY SNELLEN Comment: h/o this in 2006     Pneumonia 2006   RBBB    S/P rotator cuff repair 05/20/2015   Sleep apnea    Subdural hematoma (HCC)     Tobacco Use: Social History   Tobacco Use  Smoking Status Former   Current packs/day: 0.00   Average packs/day: 1 pack/day for 15.0 years (15.0 ttl pk-yrs)   Types: Cigarettes   Start date: 1964   Quit date: 1979   Years since quitting: 46.6  Smokeless Tobacco Never    Labs: Review Flowsheet  More data exists      Latest Ref Rng & Units 07/24/2023 07/25/2023 07/27/2023 07/30/2023 07/31/2023  Labs for ITP Cardiac and Pulmonary Rehab  Cholestrol 0 - 200 mg/dL - 785  - - -  LDL (calc) 0 - 99 mg/dL - 845  - - -  HDL-C >59 mg/dL - 44  - - -  Trlycerides <150 mg/dL - 79  - - -  Hemoglobin A1c 4.8 - 5.6 % 6.2  - - - -  PH, Arterial 7.35 - 7.45 - - 7.419  7.345  7.342  7.393  7.341  7.330  7.451   PCO2 arterial 32 - 48 mmHg - - 46.3  44.0  51.9  44.5  52.5  46.9  32.0   Bicarbonate 20.0 - 28.0 mmol/L - - 29.9  30.9  24.2  28.1  27.2  28.4  24.8  22.3   TCO2 22 - 32 mmol/L - - 31  32  26  27  30  30   32  29  28  30  30  26  23    Acid-base deficit 0.0 - 2.0 mmol/L - - - 2.0  1.0  1.0   O2 Saturation % - - 98  75  94  100  100   100  97  98     Details       Multiple values from one day are sorted in reverse-chronological order         Capillary Blood Glucose: Lab Results  Component Value Date   GLUCAP 107 (H) 10/11/2023   GLUCAP 157 (H) 08/27/2023   GLUCAP 249 (H) 08/26/2023   GLUCAP 251 (H) 08/26/2023   GLUCAP 216 (H) 08/26/2023     Exercise Target Goals: Exercise Program Goal: Individual exercise prescription set using results from initial 6 min walk test and THRR while considering  patient's activity barriers and safety.   Exercise Prescription Goal: Initial exercise prescription builds to 30-45 minutes a day of aerobic activity, 2-3 days per week.  Home exercise guidelines will be given to patient during program as part of exercise prescription that the participant will acknowledge.  Activity Barriers & Risk Stratification:  Activity Barriers & Cardiac Risk Stratification - 10/11/23 1337       Activity Barriers & Cardiac Risk Stratification   Activity Barriers Balance Concerns;History of Falls;Joint Problems;Arthritis;Back Problems;Deconditioning;Neck/Spine Problems;Shortness of Breath;Other (comment)    Comments lightheadedness    Cardiac Risk Stratification High          6 Minute Walk:  6 Minute Walk     Row Name 10/11/23 1342         6 Minute Walk   Phase Initial     Distance 720 feet     Walk Time 6 minutes     # of Rest Breaks 1  5:08-6:00  MPH 1.4     METS 1.23     RPE 14     Perceived Dyspnea  1     VO2 Peak 4.3     Symptoms No     Resting HR 63 bpm     Resting BP 114/72     Resting Oxygen  Saturation  97 %     Exercise Oxygen  Saturation  during 6 min walk 98 %     Max Ex. HR 73 bpm     Max Ex. BP 122/74     2 Minute Post BP 120/74        Oxygen  Initial Assessment:   Oxygen  Re-Evaluation:   Oxygen  Discharge (Final Oxygen  Re-Evaluation):   Initial Exercise Prescription:  Initial Exercise Prescription - 10/11/23 1300       Date of Initial Exercise  RX and Referring Provider   Date 10/11/23    Referring Provider Ozell Fell, MD    Expected Discharge Date 01/02/24      Recumbant Bike   Level 1    RPM 60    Watts 25    Minutes 15    METs 1.2      NuStep   Level 1    SPM 75    Minutes 15    METs 1.2      Prescription Details   Frequency (times per week) 3    Duration Progress to 30 minutes of continuous aerobic without signs/symptoms of physical distress      Intensity   THRR 40-80% of Max Heartrate 59-118    Ratings of Perceived Exertion 11-13    Perceived Dyspnea 0-4      Progression   Progression Continue progressive overload as per policy without signs/symptoms or physical distress.      Resistance Training   Training Prescription Yes    Weight 3 lbs    Reps 10-15          Perform Capillary Blood Glucose checks as needed.  Exercise Prescription Changes:   Exercise Comments:   Exercise Goals and Review:   Exercise Goals     Row Name 10/11/23 1110             Exercise Goals   Increase Physical Activity Yes       Intervention Provide advice, education, support and counseling about physical activity/exercise needs.;Develop an individualized exercise prescription for aerobic and resistive training based on initial evaluation findings, risk stratification, comorbidities and participant's personal goals.       Expected Outcomes Short Term: Attend rehab on a regular basis to increase amount of physical activity.;Long Term: Exercising regularly at least 3-5 days a week.;Long Term: Add in home exercise to make exercise part of routine and to increase amount of physical activity.       Increase Strength and Stamina Yes       Intervention Provide advice, education, support and counseling about physical activity/exercise needs.;Develop an individualized exercise prescription for aerobic and resistive training based on initial evaluation findings, risk stratification, comorbidities and participant's personal  goals.       Expected Outcomes Short Term: Increase workloads from initial exercise prescription for resistance, speed, and METs.;Short Term: Perform resistance training exercises routinely during rehab and add in resistance training at home;Long Term: Improve cardiorespiratory fitness, muscular endurance and strength as measured by increased METs and functional capacity ( )       Able to understand and use rate of perceived exertion (RPE) scale Yes  Intervention Provide education and explanation on how to use RPE scale       Expected Outcomes Short Term: Able to use RPE daily in rehab to express subjective intensity level;Long Term:  Able to use RPE to guide intensity level when exercising independently       Knowledge and understanding of Target Heart Rate Range (THRR) Yes       Intervention Provide education and explanation of THRR including how the numbers were predicted and where they are located for reference       Expected Outcomes Short Term: Able to state/look up THRR;Long Term: Able to use THRR to govern intensity when exercising independently;Short Term: Able to use daily as guideline for intensity in rehab       Understanding of Exercise Prescription Yes       Intervention Provide education, explanation, and written materials on patient's individual exercise prescription       Expected Outcomes Short Term: Able to explain program exercise prescription;Long Term: Able to explain home exercise prescription to exercise independently          Exercise Goals Re-Evaluation :   Discharge Exercise Prescription (Final Exercise Prescription Changes):   Nutrition:  Target Goals: Understanding of nutrition guidelines, daily intake of sodium 1500mg , cholesterol 200mg , calories 30% from fat and 7% or less from saturated fats, daily to have 5 or more servings of fruits and vegetables.  Biometrics:  Pre Biometrics - 10/11/23 1100       Pre Biometrics   Waist Circumference 51.25  inches    Hip Circumference 45 inches    Waist to Hip Ratio 1.14 %    Triceps Skinfold 25 mm    % Body Fat 31.7 %    Grip Strength 14 kg    Flexibility --   No performed; active low back pain 4/10   Single Leg Stand 1 seconds           Nutrition Therapy Plan and Nutrition Goals:   Nutrition Assessments:  MEDIFICTS Score Key: >=70 Need to make dietary changes  40-70 Heart Healthy Diet <= 40 Therapeutic Level Cholesterol Diet    Picture Your Plate Scores: <59 Unhealthy dietary pattern with much room for improvement. 41-50 Dietary pattern unlikely to meet recommendations for good health and room for improvement. 51-60 More healthful dietary pattern, with some room for improvement.  >60 Healthy dietary pattern, although there may be some specific behaviors that could be improved.    Nutrition Goals Re-Evaluation:   Nutrition Goals Re-Evaluation:   Nutrition Goals Discharge (Final Nutrition Goals Re-Evaluation):   Psychosocial: Target Goals: Acknowledge presence or absence of significant depression and/or stress, maximize coping skills, provide positive support system. Participant is able to verbalize types and ability to use techniques and skills needed for reducing stress and depression.  Initial Review & Psychosocial Screening:  Initial Psych Review & Screening - 10/11/23 1308       Initial Review   Current issues with Current Stress Concerns;Current Anxiety/Panic;Current Sleep Concerns;Current Psychotropic Meds   related to health   Source of Stress Concerns Financial;Family      Family Dynamics   Good Support System? Yes    Comments Pt has good support system with his wife and daughters      Barriers   Psychosocial barriers to participate in program The patient should benefit from training in stress management and relaxation.;There are no identifiable barriers or psychosocial needs.      Screening Interventions   Interventions Encouraged to exercise;Provide  feedback about the scores to participant    Expected Outcomes Long Term Goal: Stressors or current issues are controlled or eliminated.;Short Term goal: Identification and review with participant of any Quality of Life or Depression concerns found by scoring the questionnaire.;Long Term goal: The participant improves quality of Life and PHQ9 Scores as seen by post scores and/or verbalization of changes          Quality of Life Scores:  Quality of Life - 10/11/23 1249       Quality of Life   Select Quality of Life      Quality of Life Scores   Health/Function Pre 12.93 %    Socioeconomic Pre 25.71 %    Psych/Spiritual Pre 23 %    Family Pre 28.8 %    GLOBAL Pre 19.97 %         Scores of 19 and below usually indicate a poorer quality of life in these areas.  A difference of  2-3 points is a clinically meaningful difference.  A difference of 2-3 points in the total score of the Quality of Life Index has been associated with significant improvement in overall quality of life, self-image, physical symptoms, and general health in studies assessing change in quality of life.  PHQ-9: Review Flowsheet       10/11/2023 10/17/2014  Depression screen PHQ 2/9  Decreased Interest 0 0  Down, Depressed, Hopeless 0 0  PHQ - 2 Score 0 0  Altered sleeping 2 -  Tired, decreased energy 2 -  Change in appetite 1 -  Feeling bad or failure about yourself  1 -  Trouble concentrating 0 -  Moving slowly or fidgety/restless 0 -  Suicidal thoughts 0 -  PHQ-9 Score 6 -  Difficult doing work/chores Not difficult at all -   Interpretation of Total Score  Total Score Depression Severity:  1-4 = Minimal depression, 5-9 = Mild depression, 10-14 = Moderate depression, 15-19 = Moderately severe depression, 20-27 = Severe depression   Psychosocial Evaluation and Intervention:   Psychosocial Re-Evaluation:   Psychosocial Discharge (Final Psychosocial Re-Evaluation):   Vocational  Rehabilitation: Provide vocational rehab assistance to qualifying candidates.   Vocational Rehab Evaluation & Intervention:  Vocational Rehab - 10/11/23 1355       Initial Vocational Rehab Evaluation & Intervention   Assessment shows need for Vocational Rehabilitation No   Pt is retired         Education: Education Goals: Education classes will be provided on a weekly basis, covering required topics. Participant will state understanding/return demonstration of topics presented.     Core Videos: Exercise    Move It!  Clinical staff conducted group or individual video education with verbal and written material and guidebook.  Patient learns the recommended Pritikin exercise program. Exercise with the goal of living a long, healthy life. Some of the health benefits of exercise include controlled diabetes, healthier blood pressure levels, improved cholesterol levels, improved heart and lung capacity, improved sleep, and better body composition. Everyone should speak with their doctor before starting or changing an exercise routine.  Biomechanical Limitations Clinical staff conducted group or individual video education with verbal and written material and guidebook.  Patient learns how biomechanical limitations can impact exercise and how we can mitigate and possibly overcome limitations to have an impactful and balanced exercise routine.  Body Composition Clinical staff conducted group or individual video education with verbal and written material and guidebook.  Patient learns that body composition (ratio of muscle mass  to fat mass) is a key component to assessing overall fitness, rather than body weight alone. Increased fat mass, especially visceral belly fat, can put us  at increased risk for metabolic syndrome, type 2 diabetes, heart disease, and even death. It is recommended to combine diet and exercise (cardiovascular and resistance training) to improve your body composition. Seek  guidance from your physician and exercise physiologist before implementing an exercise routine.  Exercise Action Plan Clinical staff conducted group or individual video education with verbal and written material and guidebook.  Patient learns the recommended strategies to achieve and enjoy long-term exercise adherence, including variety, self-motivation, self-efficacy, and positive decision making. Benefits of exercise include fitness, good health, weight management, more energy, better sleep, less stress, and overall well-being.  Medical   Heart Disease Risk Reduction Clinical staff conducted group or individual video education with verbal and written material and guidebook.  Patient learns our heart is our most vital organ as it circulates oxygen , nutrients, white blood cells, and hormones throughout the entire body, and carries waste away. Data supports a plant-based eating plan like the Pritikin Program for its effectiveness in slowing progression of and reversing heart disease. The video provides a number of recommendations to address heart disease.   Metabolic Syndrome and Belly Fat  Clinical staff conducted group or individual video education with verbal and written material and guidebook.  Patient learns what metabolic syndrome is, how it leads to heart disease, and how one can reverse it and keep it from coming back. You have metabolic syndrome if you have 3 of the following 5 criteria: abdominal obesity, high blood pressure, high triglycerides, low HDL cholesterol, and high blood sugar.  Hypertension and Heart Disease Clinical staff conducted group or individual video education with verbal and written material and guidebook.  Patient learns that high blood pressure, or hypertension, is very common in the [place] . Hypertension is largely due to excessive salt intake, but other important risk factors include being overweight, physical inactivity, drinking too much alcohol, smoking, and  not eating enough potassium from fruits and vegetables. High blood pressure is a leading risk factor for heart attack, stroke, congestive heart failure, dementia, kidney failure, and premature death. Long-term effects of excessive salt intake include stiffening of the arteries and thickening of heart muscle and organ damage. Recommendations include ways to reduce hypertension and the risk of heart disease.  Diseases of Our Time - Focusing on Diabetes Clinical staff conducted group or individual video education with verbal and written material and guidebook.  Patient learns why the best way to stop diseases of our time is prevention, through food and other lifestyle changes. Medicine (such as prescription pills and surgeries) is often only a Band-Aid on the problem, not a long-term solution. Most common diseases of our time include obesity, type 2 diabetes, hypertension, heart disease, and cancer. The Pritikin Program is recommended and has been proven to help reduce, reverse, and/or prevent the damaging effects of metabolic syndrome.  Nutrition   Overview of the Pritikin Eating Plan  Clinical staff conducted group or individual video education with verbal and written material and guidebook.  Patient learns about the Pritikin Eating Plan for disease risk reduction. The Pritikin Eating Plan emphasizes a wide variety of unrefined, minimally-processed carbohydrates, like fruits, vegetables, whole grains, and legumes. Go, Caution, and Stop food choices are explained. Plant-based and lean animal proteins are emphasized. Rationale provided for low sodium intake for blood pressure control, low added sugars for blood sugar stabilization, and low  added fats and oils for coronary artery disease risk reduction and weight management.  Calorie Density  Clinical staff conducted group or individual video education with verbal and written material and guidebook.  Patient learns about calorie density and how it impacts  the Pritikin Eating Plan. Knowing the characteristics of the food you choose will help you decide whether those foods will lead to weight gain or weight loss, and whether you want to consume more or less of them. Weight loss is usually a side effect of the Pritikin Eating Plan because of its focus on low calorie-dense foods.  Label Reading  Clinical staff conducted group or individual video education with verbal and written material and guidebook.  Patient learns about the Pritikin recommended label reading guidelines and corresponding recommendations regarding calorie density, added sugars, sodium content, and whole grains.  Dining Out - Part 1  Clinical staff conducted group or individual video education with verbal and written material and guidebook.  Patient learns that restaurant meals can be sabotaging because they can be so high in calories, fat, sodium, and/or sugar. Patient learns recommended strategies on how to positively address this and avoid unhealthy pitfalls.  Facts on Fats  Clinical staff conducted group or individual video education with verbal and written material and guidebook.  Patient learns that lifestyle modifications can be just as effective, if not more so, as many medications for lowering your risk of heart disease. A Pritikin lifestyle can help to reduce your risk of inflammation and atherosclerosis (cholesterol build-up, or plaque, in the artery walls). Lifestyle interventions such as dietary choices and physical activity address the cause of atherosclerosis. A review of the types of fats and their impact on blood cholesterol levels, along with dietary recommendations to reduce fat intake is also included.  Nutrition Action Plan  Clinical staff conducted group or individual video education with verbal and written material and guidebook.  Patient learns how to incorporate Pritikin recommendations into their lifestyle. Recommendations include planning and keeping personal  health goals in mind as an important part of their success.  Healthy Mind-Set    Healthy Minds, Bodies, Hearts  Clinical staff conducted group or individual video education with verbal and written material and guidebook.  Patient learns how to identify when they are stressed. Video will discuss the impact of that stress, as well as the many benefits of stress management. Patient will also be introduced to stress management techniques. The way we think, act, and feel has an impact on our hearts.  How Our Thoughts Can Heal Our Hearts  Clinical staff conducted group or individual video education with verbal and written material and guidebook.  Patient learns that negative thoughts can cause depression and anxiety. This can result in negative lifestyle behavior and serious health problems. Cognitive behavioral therapy is an effective method to help control our thoughts in order to change and improve our emotional outlook.  Additional Videos:  Exercise    Improving Performance  Clinical staff conducted group or individual video education with verbal and written material and guidebook.  Patient learns to use a non-linear approach by alternating intensity levels and lengths of time spent exercising to help burn more calories and lose more body fat. Cardiovascular exercise helps improve heart health, metabolism, hormonal balance, blood sugar control, and recovery from fatigue. Resistance training improves strength, endurance, balance, coordination, reaction time, metabolism, and muscle mass. Flexibility exercise improves circulation, posture, and balance. Seek guidance from your physician and exercise physiologist before implementing an exercise routine and  learn your capabilities and proper form for all exercise.  Introduction to Yoga  Clinical staff conducted group or individual video education with verbal and written material and guidebook.  Patient learns about yoga, a discipline of the coming  together of mind, breath, and body. The benefits of yoga include improved flexibility, improved range of motion, better posture and core strength, increased lung function, weight loss, and positive self-image. Yoga's heart health benefits include lowered blood pressure, healthier heart rate, decreased cholesterol and triglyceride levels, improved immune function, and reduced stress. Seek guidance from your physician and exercise physiologist before implementing an exercise routine and learn your capabilities and proper form for all exercise.  Medical   Aging: Enhancing Your Quality of Life  Clinical staff conducted group or individual video education with verbal and written material and guidebook.  Patient learns key strategies and recommendations to stay in good physical health and enhance quality of life, such as prevention strategies, having an advocate, securing a Health Care Proxy and Power of Attorney, and keeping a list of medications and system for tracking them. It also discusses how to avoid risk for bone loss.  Biology of Weight Control  Clinical staff conducted group or individual video education with verbal and written material and guidebook.  Patient learns that weight gain occurs because we consume more calories than we burn (eating more, moving less). Even if your body weight is normal, you may have higher ratios of fat compared to muscle mass. Too much body fat puts you at increased risk for cardiovascular disease, heart attack, stroke, type 2 diabetes, and obesity-related cancers. In addition to exercise, following the Pritikin Eating Plan can help reduce your risk.  Decoding Lab Results  Clinical staff conducted group or individual video education with verbal and written material and guidebook.  Patient learns that lab test reflects one measurement whose values change over time and are influenced by many factors, including medication, stress, sleep, exercise, food, hydration,  pre-existing medical conditions, and more. It is recommended to use the knowledge from this video to become more involved with your lab results and evaluate your numbers to speak with your doctor.   Diseases of Our Time - Overview  Clinical staff conducted group or individual video education with verbal and written material and guidebook.  Patient learns that according to the CDC, 50% to 70% of chronic diseases (such as obesity, type 2 diabetes, elevated lipids, hypertension, and heart disease) are avoidable through lifestyle improvements including healthier food choices, listening to satiety cues, and increased physical activity.  Sleep Disorders Clinical staff conducted group or individual video education with verbal and written material and guidebook.  Patient learns how good quality and duration of sleep are important to overall health and well-being. Patient also learns about sleep disorders and how they impact health along with recommendations to address them, including discussing with a physician.  Nutrition  Dining Out - Part 2 Clinical staff conducted group or individual video education with verbal and written material and guidebook.  Patient learns how to plan ahead and communicate in order to maximize their dining experience in a healthy and nutritious manner. Included are recommended food choices based on the type of restaurant the patient is visiting.   Fueling a Banker conducted group or individual video education with verbal and written material and guidebook.  There is a strong connection between our food choices and our health. Diseases like obesity and type 2 diabetes are very prevalent and are  in large-part due to lifestyle choices. The Pritikin Eating Plan provides plenty of food and hunger-curbing satisfaction. It is easy to follow, affordable, and helps reduce health risks.  Menu Workshop  Clinical staff conducted group or individual video education  with verbal and written material and guidebook.  Patient learns that restaurant meals can sabotage health goals because they are often packed with calories, fat, sodium, and sugar. Recommendations include strategies to plan ahead and to communicate with the manager, chef, or server to help order a healthier meal.  Planning Your Eating Strategy  Clinical staff conducted group or individual video education with verbal and written material and guidebook.  Patient learns about the Pritikin Eating Plan and its benefit of reducing the risk of disease. The Pritikin Eating Plan does not focus on calories. Instead, it emphasizes high-quality, nutrient-rich foods. By knowing the characteristics of the foods, we choose, we can determine their calorie density and make informed decisions.  Targeting Your Nutrition Priorities  Clinical staff conducted group or individual video education with verbal and written material and guidebook.  Patient learns that lifestyle habits have a tremendous impact on disease risk and progression. This video provides eating and physical activity recommendations based on your personal health goals, such as reducing LDL cholesterol, losing weight, preventing or controlling type 2 diabetes, and reducing high blood pressure.  Vitamins and Minerals  Clinical staff conducted group or individual video education with verbal and written material and guidebook.  Patient learns different ways to obtain key vitamins and minerals, including through a recommended healthy diet. It is important to discuss all supplements you take with your doctor.   Healthy Mind-Set    Smoking Cessation  Clinical staff conducted group or individual video education with verbal and written material and guidebook.  Patient learns that cigarette smoking and tobacco addiction pose a serious health risk which affects millions of people. Stopping smoking will significantly reduce the risk of heart disease, lung disease,  and many forms of cancer. Recommended strategies for quitting are covered, including working with your doctor to develop a successful plan.  Culinary   Becoming a Set designer conducted group or individual video education with verbal and written material and guidebook.  Patient learns that cooking at home can be healthy, cost-effective, quick, and puts them in control. Keys to cooking healthy recipes will include looking at your recipe, assessing your equipment needs, planning ahead, making it simple, choosing cost-effective seasonal ingredients, and limiting the use of added fats, salts, and sugars.  Cooking - Breakfast and Snacks  Clinical staff conducted group or individual video education with verbal and written material and guidebook.  Patient learns how important breakfast is to satiety and nutrition through the entire day. Recommendations include key foods to eat during breakfast to help stabilize blood sugar levels and to prevent overeating at meals later in the day. Planning ahead is also a key component.  Cooking - Educational psychologist conducted group or individual video education with verbal and written material and guidebook.  Patient learns eating strategies to improve overall health, including an approach to cook more at home. Recommendations include thinking of animal protein as a side on your plate rather than center stage and focusing instead on lower calorie dense options like vegetables, fruits, whole grains, and plant-based proteins, such as beans. Making sauces in large quantities to freeze for later and leaving the skin on your vegetables are also recommended to maximize your experience.  Cooking -  Healthy Salads and Dressing Clinical staff conducted group or individual video education with verbal and written material and guidebook.  Patient learns that vegetables, fruits, whole grains, and legumes are the foundations of the Pritikin Eating Plan.  Recommendations include how to incorporate each of these in flavorful and healthy salads, and how to create homemade salad dressings. Proper handling of ingredients is also covered. Cooking - Soups and [employer] - Soups and Desserts Clinical staff conducted group or individual video education with verbal and written material and guidebook.  Patient learns that Pritikin soups and desserts make for easy, nutritious, and delicious snacks and meal components that are low in sodium, fat, sugar, and calorie density, while high in vitamins, minerals, and filling fiber. Recommendations include simple and healthy ideas for soups and desserts.   Overview     The Pritikin Solution Program Overview Clinical staff conducted group or individual video education with verbal and written material and guidebook.  Patient learns that the results of the Pritikin Program have been documented in more than 100 articles published in peer-reviewed journals, and the benefits include reducing risk factors for (and, in some cases, even reversing) high cholesterol, high blood pressure, type 2 diabetes, obesity, and more! An overview of the three key pillars of the Pritikin Program will be covered: eating well, doing regular exercise, and having a healthy mind-set.  WORKSHOPS  Exercise: Exercise Basics: Building Your Action Plan Clinical staff led group instruction and group discussion with PowerPoint presentation and patient guidebook. To enhance the learning environment the use of posters, models and videos may be added. At the conclusion of this workshop, patients will comprehend the difference between physical activity and exercise, as well as the benefits of incorporating both, into their routine. Patients will understand the FITT (Frequency, Intensity, Time, and Type) principle and how to use it to build an exercise action plan. In addition, safety concerns and other considerations for exercise and cardiac rehab  will be addressed by the presenter. The purpose of this lesson is to promote a comprehensive and effective weekly exercise routine in order to improve patients' overall level of fitness.   Managing Heart Disease: Your Path to a Healthier Heart Clinical staff led group instruction and group discussion with PowerPoint presentation and patient guidebook. To enhance the learning environment the use of posters, models and videos may be added.At the conclusion of this workshop, patients will understand the anatomy and physiology of the heart. Additionally, they will understand how Pritikin's three pillars impact the risk factors, the progression, and the management of heart disease.  The purpose of this lesson is to provide a high-level overview of the heart, heart disease, and how the Pritikin lifestyle positively impacts risk factors.  Exercise Biomechanics Clinical staff led group instruction and group discussion with PowerPoint presentation and patient guidebook. To enhance the learning environment the use of posters, models and videos may be added. Patients will learn how the structural parts of their bodies function and how these functions impact their daily activities, movement, and exercise. Patients will learn how to promote a neutral spine, learn how to manage pain, and identify ways to improve their physical movement in order to promote healthy living. The purpose of this lesson is to expose patients to common physical limitations that impact physical activity. Participants will learn practical ways to adapt and manage aches and pains, and to minimize their effect on regular exercise. Patients will learn how to maintain good posture while sitting, walking, and lifting.  Balance Training and Fall Prevention  Clinical staff led group instruction and group discussion with PowerPoint presentation and patient guidebook. To enhance the learning environment the use of posters, models and videos  may be added. At the conclusion of this workshop, patients will understand the importance of their sensorimotor skills (vision, proprioception, and the vestibular system) in maintaining their ability to balance as they age. Patients will apply a variety of balancing exercises that are appropriate for their current level of function. Patients will understand the common causes for poor balance, possible solutions to these problems, and ways to modify their physical environment in order to minimize their fall risk. The purpose of this lesson is to teach patients about the importance of maintaining balance as they age and ways to minimize their risk of falling.  WORKSHOPS   Nutrition:  Fueling a Ship broker led group instruction and group discussion with PowerPoint presentation and patient guidebook. To enhance the learning environment the use of posters, models and videos may be added. Patients will review the foundational principles of the Pritikin Eating Plan and understand what constitutes a serving size in each of the food groups. Patients will also learn Pritikin-friendly foods that are better choices when away from home and review make-ahead meal and snack options. Calorie density will be reviewed and applied to three nutrition priorities: weight maintenance, weight loss, and weight gain. The purpose of this lesson is to reinforce (in a group setting) the key concepts around what patients are recommended to eat and how to apply these guidelines when away from home by planning and selecting Pritikin-friendly options. Patients will understand how calorie density may be adjusted for different weight management goals.  Mindful Eating  Clinical staff led group instruction and group discussion with PowerPoint presentation and patient guidebook. To enhance the learning environment the use of posters, models and videos may be added. Patients will briefly review the concepts of the Pritikin  Eating Plan and the importance of low-calorie dense foods. The concept of mindful eating will be introduced as well as the importance of paying attention to internal hunger signals. Triggers for non-hunger eating and techniques for dealing with triggers will be explored. The purpose of this lesson is to provide patients with the opportunity to review the basic principles of the Pritikin Eating Plan, discuss the value of eating mindfully and how to measure internal cues of hunger and fullness using the Hunger Scale. Patients will also discuss reasons for non-hunger eating and learn strategies to use for controlling emotional eating.  Targeting Your Nutrition Priorities Clinical staff led group instruction and group discussion with PowerPoint presentation and patient guidebook. To enhance the learning environment the use of posters, models and videos may be added. Patients will learn how to determine their genetic susceptibility to disease by reviewing their family history. Patients will gain insight into the importance of diet as part of an overall healthy lifestyle in mitigating the impact of genetics and other environmental insults. The purpose of this lesson is to provide patients with the opportunity to assess their personal nutrition priorities by looking at their family history, their own health history and current risk factors. Patients will also be able to discuss ways of prioritizing and modifying the Pritikin Eating Plan for their highest risk areas  Menu  Clinical staff led group instruction and group discussion with PowerPoint presentation and patient guidebook. To enhance the learning environment the use of posters, models and videos may be added. Using menus brought in  from [employer], or printed from [employer], patients will apply the Pritikin dining out guidelines that were presented in the [employer] video. Patients will also be able to practice these guidelines  in a variety of provided scenarios. The purpose of this lesson is to provide patients with the opportunity to practice hands-on learning of the Pritikin Dining Out guidelines with actual menus and practice scenarios.  Label Reading Clinical staff led group instruction and group discussion with PowerPoint presentation and patient guidebook. To enhance the learning environment the use of posters, models and videos may be added. Patients will review and discuss the Pritikin label reading guidelines presented in Pritikin's Label Reading Educational series video. Using fool labels brought in from local grocery stores and markets, patients will apply the label reading guidelines and determine if the packaged food meet the Pritikin guidelines. The purpose of this lesson is to provide patients with the opportunity to review, discuss, and practice hands-on learning of the Pritikin Label Reading guidelines with actual packaged food labels. Cooking School  Pritikin's [employer] are designed to teach patients ways to prepare quick, simple, and affordable recipes at home. The importance of nutrition's role in chronic disease risk reduction is reflected in its emphasis in the overall Pritikin program. By learning how to prepare essential core Pritikin Eating Plan recipes, patients will increase control over what they eat; be able to customize the flavor of foods without the use of added salt, sugar, or fat; and improve the quality of the food they consume. By learning a set of core recipes which are easily assembled, quickly prepared, and affordable, patients are more likely to prepare more healthy foods at home. These workshops focus on convenient breakfasts, simple entres, side dishes, and desserts which can be prepared with minimal effort and are consistent with nutrition recommendations for cardiovascular risk reduction. Cooking [employer] are taught by a Armed forces logistics/support/administrative officer (RD) who has  been trained by the [employer]. The chef or RD has a clear understanding of the importance of minimizing - if not completely eliminating - added fat, sugar, and sodium in recipes. Throughout the series of Cooking School Workshop sessions, patients will learn about healthy ingredients and efficient methods of cooking to build confidence in their capability to prepare    Cooking School weekly topics:  Adding Flavor- Sodium-Free  Fast and Healthy Breakfasts  Powerhouse Plant-Based Proteins  Satisfying Salads and Dressings  Simple Sides and Sauces  International Cuisine-Spotlight on the [employer] Zones  Delicious Desserts  Savory Soups  [employer] - Meals in a Astronomer Appetizers and Snacks  Comforting Weekend Breakfasts  One-Pot Wonders   Fast Evening Meals  Landscape architect Your Pritikin Plate  WORKSHOPS   Healthy Mindset (Psychosocial):  Focused Goals, Sustainable Changes Clinical staff led group instruction and group discussion with PowerPoint presentation and patient guidebook. To enhance the learning environment the use of posters, models and videos may be added. Patients will be able to apply effective goal setting strategies to establish at least one personal goal, and then take consistent, meaningful action toward that goal. They will learn to identify common barriers to achieving personal goals and develop strategies to overcome them. Patients will also gain an understanding of how our mind-set can impact our ability to achieve goals and the importance of cultivating a positive and growth-oriented mind-set. The purpose of this lesson is to provide patients with a deeper understanding of how to set  and achieve personal goals, as well as the tools and strategies needed to overcome common obstacles which may arise along the way.  From Head to Heart: The Power of a Healthy Outlook  Clinical staff led group instruction and group discussion with  PowerPoint presentation and patient guidebook. To enhance the learning environment the use of posters, models and videos may be added. Patients will be able to recognize and describe the impact of emotions and mood on physical health. They will discover the importance of self-care and explore self-care practices which may work for them. Patients will also learn how to utilize the 4 C's to cultivate a healthier outlook and better manage stress and challenges. The purpose of this lesson is to demonstrate to patients how a healthy outlook is an essential part of maintaining good health, especially as they continue their cardiac rehab journey.  Healthy Sleep for a Healthy Heart Clinical staff led group instruction and group discussion with PowerPoint presentation and patient guidebook. To enhance the learning environment the use of posters, models and videos may be added. At the conclusion of this workshop, patients will be able to demonstrate knowledge of the importance of sleep to overall health, well-being, and quality of life. They will understand the symptoms of, and treatments for, common sleep disorders. Patients will also be able to identify daytime and nighttime behaviors which impact sleep, and they will be able to apply these tools to help manage sleep-related challenges. The purpose of this lesson is to provide patients with a general overview of sleep and outline the importance of quality sleep. Patients will learn about a few of the most common sleep disorders. Patients will also be introduced to the concept of "sleep hygiene," and discover ways to self-manage certain sleeping problems through simple daily behavior changes. Finally, the workshop will motivate patients by clarifying the links between quality sleep and their goals of heart-healthy living.   Recognizing and Reducing Stress Clinical staff led group instruction and group discussion with PowerPoint presentation and patient guidebook. To  enhance the learning environment the use of posters, models and videos may be added. At the conclusion of this workshop, patients will be able to understand the types of stress reactions, differentiate between acute and chronic stress, and recognize the impact that chronic stress has on their health. They will also be able to apply different coping mechanisms, such as reframing negative self-talk. Patients will have the opportunity to practice a variety of stress management techniques, such as deep abdominal breathing, progressive muscle relaxation, and/or guided imagery.  The purpose of this lesson is to educate patients on the role of stress in their lives and to provide healthy techniques for coping with it.  Learning Barriers/Preferences:  Learning Barriers/Preferences - 10/11/23 1354       Learning Barriers/Preferences   Learning Barriers Sight;Hearing    Learning Preferences Video;Pictoral;Computer/Internet          Education Topics:  Knowledge Questionnaire Score:  Knowledge Questionnaire Score - 10/11/23 1250       Knowledge Questionnaire Score   Pre Score 21/24          Core Components/Risk Factors/Patient Goals at Admission:  Personal Goals and Risk Factors at Admission - 10/11/23 1402       Core Components/Risk Factors/Patient Goals on Admission    Weight Management Weight Loss    Diabetes Yes    Intervention Provide education about signs/symptoms and action to take for hypo/hyperglycemia.;Provide education about proper nutrition, including hydration, and aerobic/resistive exercise  prescription along with prescribed medications to achieve blood glucose in normal ranges: Fasting glucose 65-99 mg/dL    Expected Outcomes Short Term: Participant verbalizes understanding of the signs/symptoms and immediate care of hyper/hypoglycemia, proper foot care and importance of medication, aerobic/resistive exercise and nutrition plan for blood glucose control.;Long Term: Attainment of  HbA1C < 7%.    Hypertension Yes    Intervention Provide education on lifestyle modifcations including regular physical activity/exercise, weight management, moderate sodium restriction and increased consumption of fresh fruit, vegetables, and low fat dairy, alcohol moderation, and smoking cessation.;Monitor prescription use compliance.    Expected Outcomes Short Term: Continued assessment and intervention until BP is < 140/66mm HG in hypertensive participants. < 130/74mm HG in hypertensive participants with diabetes, heart failure or chronic kidney disease.;Long Term: Maintenance of blood pressure at goal levels.    Lipids Yes    Intervention Provide education and support for participant on nutrition & aerobic/resistive exercise along with prescribed medications to achieve LDL 70mg , HDL >40mg .    Expected Outcomes Short Term: Participant states understanding of desired cholesterol values and is compliant with medications prescribed. Participant is following exercise prescription and nutrition guidelines.;Long Term: Cholesterol controlled with medications as prescribed, with individualized exercise RX and with personalized nutrition plan. Value goals: LDL < 70mg , HDL > 40 mg.    Stress Yes    Intervention Offer individual and/or small group education and counseling on adjustment to heart disease, stress management and health-related lifestyle change. Teach and support self-help strategies.;Refer participants experiencing significant psychosocial distress to appropriate mental health specialists for further evaluation and treatment. When possible, include family members and significant others in education/counseling sessions.    Expected Outcomes Short Term: Participant demonstrates changes in health-related behavior, relaxation and other stress management skills, ability to obtain effective social support, and compliance with psychotropic medications if prescribed.;Long Term: Emotional wellbeing is  indicated by absence of clinically significant psychosocial distress or social isolation.          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 Introduction to [facility] cardiac rehab. Initial orientation packet reviewed with patient          Comments: Participant attended orientation for the cardiac rehabilitation program on  10/11/2023  to perform initial intake and exercise walk test. Patient introduced to the [facility] education and orientation packet was reviewed. Completed 6-minute walk test, measurements, initial ITP, and exercise prescription. Vital signs stable. Telemetry-normal sinus rhythm, asymptomatic.   Service time was from 10:34 to 13:05.

## 2023-10-11 NOTE — Progress Notes (Signed)
 Cardiac Rehab Medication Review by a Nurse   Does the patient  feel that his/her medications are working for him/her?  yes   Has the patient been experiencing any side effects to the medications prescribed?  yes   Does the patient measure his/her own blood pressure or blood glucose at home?  yes    Does the patient have any problems obtaining medications due to transportation or finances?   yes   Understanding of regimen: excellent Understanding of indications: excellent Potential of compliance: good      Nurse comments: Pt stated, I do not have any medication questions at this time

## 2023-10-15 ENCOUNTER — Encounter (HOSPITAL_COMMUNITY)
Admission: RE | Admit: 2023-10-15 | Discharge: 2023-10-15 | Disposition: A | Discharge status: Home / Self Care | Source: Ambulatory Visit | Attending: Cardiovascular Disease | Admitting: Cardiovascular Disease

## 2023-10-15 DIAGNOSIS — Z951 Presence of aortocoronary bypass graft: Secondary | ICD-10-CM | POA: Diagnosis not present

## 2023-10-15 LAB — GLUCOSE, CAPILLARY: Glucose-Capillary: 109 mg/dL — ABNORMAL HIGH (ref 70–99)

## 2023-10-15 NOTE — Progress Notes (Signed)
 Daily Session Note  Patient Details  Name: [name]. MRN: [id number] Date of Birth: 1951-02-25 Referring Provider:   Flowsheet Row INTENSIVE CARDIAC REHAB ORIENT from 10/11/2023 in [facility]  Referring Provider Ozell Fell, MD    Encounter Date: 10/15/2023  Check In:  Session Check In - 10/15/23 1442       Check-In   Supervising physician immediately available to respond to emergencies [facility] MD immediately available    Physician(s) Lum Louis, NP    Location [facility]    Staff Present Fairy Music, RN, Mallory Parkins, MS, ACSM-CEP, CCRP, Exercise Physiologist;Olinty Valere, MS, ACSM-CEP, Exercise Physiologist;Jetta Vannie BS, ACSM-CEP, Exercise Physiologist;Marykate Heuberger Cyrus, RN, Marcine Pereyra, MS, Exercise Physiologist    Virtual Visit No    Medication changes reported     No    Fall or balance concerns reported    No    Tobacco Cessation No Change    Warm-up and Cool-down Performed as group-led instruction    Resistance Training Performed Yes    VAD Patient? No    PAD/SET Patient? No      Pain Assessment   Currently in Pain? No/denies    Pain Score 0-No pain    Multiple Pain Sites No          Capillary Blood Glucose: Results for orders placed or performed during the hospital encounter of 10/15/23 (from the past 24 hours)  Glucose, capillary     Status: Abnormal   Collection Time: 10/15/23  3:55 PM  Result Value Ref Range   Glucose-Capillary 109 (H) 70 - 99 mg/dL     Exercise Prescription Changes - 10/15/23 1637       Response to Exercise   Blood Pressure (Admit) 154/72    Blood Pressure (Exercise) 154/70    Blood Pressure (Exit) 112/72    Heart Rate (Admit) 68 bpm    Heart Rate (Exercise) 78 bpm    Heart Rate (Exit) 70 bpm    Rating of Perceived Exertion (Exercise) 12    Perceived Dyspnea (Exercise) 0    Symptoms Dizzy during wts, resolved    Comments Pt  first day in the [facility] program    Duration Progress to 30 minutes of  aerobic without signs/symptoms of physical distress    Intensity THRR unchanged      Progression   Progression Continue to progress workloads to maintain intensity without signs/symptoms of physical distress.    Average METs 1.55      Resistance Training   Training Prescription Yes    Weight 3 lbs    Reps 10-15    Time 10 Minutes      Recumbant Bike   Level 1    RPM 34    Watts 8    Minutes 15    METs 1.7      NuStep   Level 1    SPM 53    Minutes 15    METs 1.4          Social History   Tobacco Use  Smoking Status Former   Current packs/day: 0.00   Average packs/day: 1 pack/day for 15.0 years (15.0 ttl pk-yrs)   Types: Cigarettes   Start date: 85   Quit date: 1979   Years since quitting: 46.6  Smokeless Tobacco Never    Goals Met:  Strength training completed today  Goals Unmet:  Reported feeling lightheaded briefly during weight training resolved  quickly  Comments: Pt started cardiac rehab today.  Pt tolerated light exercise without difficulty. VSS, telemetry-Sinus Rhythm, bundle branch block, asymptomatic.  Medication list reconciled. Pt denies barriers to medicaiton compliance.  PSYCHOSOCIAL ASSESSMENT:  PHQ-6.    Pt enjoys working on car, Animator and sports.   Pt oriented to exercise equipment and routine.    Understanding verbalized.Hadassah Elpidio Quan RN BSN     Dr. Wilbert Bihari is Medical Director for Cardiac Rehab at [facility].

## 2023-10-16 LAB — GLUCOSE, CAPILLARY: Glucose-Capillary: 139 mg/dL — ABNORMAL HIGH (ref 70–99)

## 2023-10-17 ENCOUNTER — Encounter (HOSPITAL_COMMUNITY)
Admission: RE | Admit: 2023-10-17 | Discharge: 2023-10-17 | Disposition: A | Discharge status: Home / Self Care | Source: Ambulatory Visit | Attending: Cardiovascular Disease | Admitting: Cardiovascular Disease

## 2023-10-17 DIAGNOSIS — Z951 Presence of aortocoronary bypass graft: Secondary | ICD-10-CM | POA: Diagnosis not present

## 2023-10-19 ENCOUNTER — Encounter (HOSPITAL_COMMUNITY)
Admission: RE | Admit: 2023-10-19 | Discharge: 2023-10-19 | Disposition: A | Discharge status: Home / Self Care | Source: Ambulatory Visit | Attending: Cardiovascular Disease | Admitting: Cardiovascular Disease

## 2023-10-19 DIAGNOSIS — Z951 Presence of aortocoronary bypass graft: Secondary | ICD-10-CM | POA: Diagnosis not present

## 2023-10-22 ENCOUNTER — Encounter (HOSPITAL_COMMUNITY)
Admission: RE | Admit: 2023-10-22 | Discharge: 2023-10-22 | Disposition: A | Discharge status: Home / Self Care | Source: Ambulatory Visit | Attending: Cardiovascular Disease | Admitting: Cardiovascular Disease

## 2023-10-22 DIAGNOSIS — Z951 Presence of aortocoronary bypass graft: Secondary | ICD-10-CM | POA: Diagnosis not present

## 2023-10-24 ENCOUNTER — Encounter (HOSPITAL_COMMUNITY)
Admission: RE | Admit: 2023-10-24 | Discharge: 2023-10-24 | Disposition: A | Discharge status: Home / Self Care | Source: Ambulatory Visit | Attending: Cardiovascular Disease | Admitting: Cardiovascular Disease

## 2023-10-24 DIAGNOSIS — Z951 Presence of aortocoronary bypass graft: Secondary | ICD-10-CM | POA: Diagnosis not present

## 2023-10-26 ENCOUNTER — Encounter (HOSPITAL_COMMUNITY)
Admission: RE | Admit: 2023-10-26 | Discharge: 2023-10-26 | Disposition: A | Discharge status: Home / Self Care | Source: Ambulatory Visit | Attending: Cardiovascular Disease | Admitting: Cardiovascular Disease

## 2023-10-26 DIAGNOSIS — Z951 Presence of aortocoronary bypass graft: Secondary | ICD-10-CM

## 2023-10-31 ENCOUNTER — Encounter (HOSPITAL_COMMUNITY)
Admission: RE | Admit: 2023-10-31 | Discharge: 2023-10-31 | Disposition: A | Discharge status: Home / Self Care | Source: Ambulatory Visit | Attending: Cardiovascular Disease | Admitting: Cardiovascular Disease

## 2023-10-31 DIAGNOSIS — Z48812 Encounter for surgical aftercare following surgery on the circulatory system: Secondary | ICD-10-CM | POA: Diagnosis present

## 2023-10-31 DIAGNOSIS — Z951 Presence of aortocoronary bypass graft: Secondary | ICD-10-CM | POA: Insufficient documentation

## 2023-11-02 ENCOUNTER — Encounter (HOSPITAL_COMMUNITY)
Admission: RE | Admit: 2023-11-02 | Discharge: 2023-11-02 | Disposition: A | Discharge status: Home / Self Care | Source: Ambulatory Visit | Attending: Cardiovascular Disease | Admitting: Cardiovascular Disease

## 2023-11-02 DIAGNOSIS — Z48812 Encounter for surgical aftercare following surgery on the circulatory system: Secondary | ICD-10-CM | POA: Diagnosis not present

## 2023-11-02 DIAGNOSIS — Z951 Presence of aortocoronary bypass graft: Secondary | ICD-10-CM

## 2023-11-05 ENCOUNTER — Encounter (HOSPITAL_COMMUNITY)
Admission: RE | Admit: 2023-11-05 | Discharge: 2023-11-05 | Disposition: A | Discharge status: Home / Self Care | Source: Ambulatory Visit | Attending: Cardiovascular Disease | Admitting: Cardiovascular Disease

## 2023-11-05 DIAGNOSIS — Z951 Presence of aortocoronary bypass graft: Secondary | ICD-10-CM

## 2023-11-05 DIAGNOSIS — Z48812 Encounter for surgical aftercare following surgery on the circulatory system: Secondary | ICD-10-CM | POA: Diagnosis not present

## 2023-11-06 NOTE — Progress Notes (Signed)
 Cardiac Individual Treatment Plan  Patient Details  Name: [name]. 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 (Patient not taking: Reported on 10/11/2023), Disp: 10 tablet, Rfl: 0   amiodarone  (PACERONE ) 200 MG tablet, Take one tablet twice daily until July 01/2024, and then on July 13/2025, start taking one tablet daily, Disp: 42 tablet, Rfl: 0   apixaban  (ELIQUIS ) 5 MG TABS tablet, Take 5 mg by mouth 2 (two) times daily., Disp: , Rfl:    aspirin  EC 81 MG tablet, Take 1 tablet (81 mg total) by mouth daily. Swallow whole., Disp: , Rfl:    Azelastine  HCl 137 MCG/SPRAY SOLN, Place 1 spray into both nostrils 2 (two) times daily. , Disp: , Rfl:    carvedilol  (COREG ) 12.5 MG tablet, Take 1 tablet (12.5 mg total) by mouth 2 (two) times daily., Disp: , Rfl:    Cholecalciferol (VITAMIN D) 50 MCG (2000 UT) CAPS, Take 1 capsule by mouth at bedtime., Disp: , Rfl:    Coenzyme Q10 (COQ10) 100 MG CAPS, Take 100 mg by mouth daily., Disp: , Rfl:    cromolyn  (OPTICROM ) 4 % ophthalmic solution, Place 1 drop into both eyes every 6 (six) hours  as needed (itchy eyes)., Disp: , Rfl:    empagliflozin  (JARDIANCE ) 10 MG TABS tablet, Take 1 tablet (10 mg total) by mouth daily. (Patient not taking: Reported on 10/11/2023), Disp: 30 tablet, Rfl: 0   feeding supplement (ENSURE PLUS HIGH PROTEIN) LIQD, Take 237 mLs by mouth 2 (two) times daily between meals. (Patient not taking: Reported on 10/11/2023), Disp: 14220 mL, Rfl: 0   ferrous gluconate  (FERGON) 324 MG tablet, Take 1 tablet (324 mg total) by mouth daily with breakfast. (Patient not taking: Reported on 10/11/2023), Disp: 30 tablet, Rfl: 0   fluticasone  (FLONASE ) 50 MCG/ACT nasal spray, Place 2 sprays into both nostrils 2 (two) times daily., Disp: , Rfl:    furosemide  (LASIX ) 40 MG tablet, Take 1 tablet (40 mg total) by mouth 2 (two) times daily. (Patient taking differently: Take 20 mg by mouth daily.), Disp: 30 tablet, Rfl: 0   Glucosamine-Chondroit-Vit C-Mn (GLUCOSAMINE 1500 COMPLEX PO), Take 1 tablet by mouth at bedtime., Disp: , Rfl:    hydrOXYzine  (VISTARIL ) 25 MG capsule, Take 25 mg by mouth 2 (two) times daily as needed (sleep)., Disp: , Rfl:    insulin  aspart (NOVOLOG ) 100 UNIT/ML injection, Inject 0-8 Units into the skin 3 (three) times daily before meals. Inject as per sliding scale: 0-150 = 0 units 151-200 = 2 units 201-250 = 3 units 251-300 = 4 units 301-350 = 6  units 351-400 = 8 units 401-999 = 8 units, Call MD, Disp: , Rfl:    insulin  glargine-yfgn (SEMGLEE ) 100 UNIT/ML injection, Inject 0.2 mLs (20 Units total) into the skin daily., Disp: 10 mL, Rfl: 11   loperamide  (IMODIUM ) 2 MG capsule, Take 1 capsule (2 mg total) by mouth every 8 (eight) hours as needed for diarrhea or loose stools. (Patient not taking: Reported on 09/17/2023), Disp: 10 capsule, Rfl: 0   losartan (COZAAR) 25 MG tablet, Take 25 mg by mouth daily. (Patient not taking: Reported on 10/11/2023), Disp: , Rfl:    MAGNESIUM  OXIDE PO, Take 500 mg by mouth at bedtime., Disp: , Rfl:    Multiple Vitamin (MULTIVITAMIN WITH  MINERALS) TABS, Take 1 tablet by mouth every evening. , Disp: , Rfl:    neomycin-polymyxin-hydrocortisone (CORTISPORIN) OTIC solution, Place 4 drops into the right ear 4 (four) times daily as needed (itchy ears)., Disp: , Rfl:    olopatadine  (PATANOL) 0.1 % ophthalmic solution, Place 1 drop into both eyes 2 (two) times daily., Disp: , Rfl:    polyethylene glycol powder (GLYCOLAX /MIRALAX ) 17 GM/SCOOP powder, Take 34 g by mouth daily as needed for mild constipation or moderate constipation., Disp: 255 g, Rfl: 0   potassium chloride  (MICRO-K ) 10 MEQ CR capsule, Take 10 mEq by mouth 2 (two) times daily. (Patient taking differently: Take 10 mEq by mouth daily.), Disp: , Rfl:    predniSONE  (STERAPRED UNI-PAK 21 TAB) 10 MG (21) TBPK tablet, Take one tablet 4 times daily for one day, then one tablet 3 times per day for two days, then one tablet twicedaily for two days, then 1 tablet daily for two days, then half tablet daily for two days then stop. (Patient not taking: Reported on 10/11/2023), Disp: 17 tablet, Rfl: 0   Probiotic Product (PROBIOTIC DAILY PO), Take 2 capsules by mouth daily. (Patient not taking: Reported on 10/11/2023), Disp: , Rfl:    spironolactone  (ALDACTONE ) 25 MG tablet, Take 1 tablet (25 mg total) by mouth daily. (Patient not taking: Reported on 10/11/2023), Disp: 30 tablet, Rfl: 0   terazosin  (HYTRIN ) 2 MG capsule, Take 2 mg by mouth at bedtime., Disp: , Rfl:    Tiotropium Bromide  Monohydrate (SPIRIVA  RESPIMAT) 2.5 MCG/ACT AERS, Inhale 2 puffs into the lungs daily., Disp: , Rfl:    traZODone  (DESYREL ) 100 MG tablet, Take 100 mg by mouth at bedtime., Disp: , Rfl:    vitamin B-12 (CYANOCOBALAMIN) 500 MCG tablet, Take 500 mcg by mouth every evening., Disp: , Rfl:   Past Medical History: Past Medical History:  Diagnosis Date   Allergy    Anxiety    Arthritis    Cataract    bilateral lens implants   Clotting disorder (HCC)    DVT in 2006 x1; no problems since   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/07/2019   Diabetes mellitus    2   Diverticulitis    Diverticulosis    DVT (deep venous thrombosis) (HCC) 2007   left leg   Hepatitis 1973   treated at [facility]   HLD (hyperlipidemia)    Hypertension    Hypertensive urgency 07/31/2019   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   Neuromuscular disorder (HCC)    neuropathy   Non-toxic multinodular goiter 07/28/2023   Other psoriasis 07/28/2023   Paroxysmal atrial fibrillation (HCC) 09/22/2021   Peripheral vascular disease (HCC) 2006   dvt 's   Personal history of colon polyps, unspecified 07/28/2023  Phlebitis and thrombophlebitis of lower extremities 07/28/2023   Aug 28, 2008 Entered By: POLLY SNELLEN Comment: h/o this in 2006     Pneumonia 2006   RBBB    S/P rotator cuff repair 05/20/2015   Sleep apnea    Subdural hematoma (HCC)     Tobacco Use: Social History   Tobacco Use  Smoking Status Former   Current packs/day: 0.00   Average packs/day: 1 pack/day for 15.0 years (15.0 ttl pk-yrs)   Types: Cigarettes   Start date: 1964   Quit date: 1979   Years since quitting: 46.7  Smokeless Tobacco Never    Labs: Review Flowsheet  More data exists      Latest Ref Rng & Units 07/24/2023 07/25/2023 07/27/2023 07/30/2023 07/31/2023  Labs for ITP Cardiac and Pulmonary Rehab  Cholestrol 0 - 200 mg/dL - 785  - - -  LDL (calc) 0 - 99 mg/dL - 845  - - -  HDL-C >59 mg/dL - 44  - - -  Trlycerides <150 mg/dL - 79  - - -  Hemoglobin A1c 4.8 - 5.6 % 6.2  - - - -  PH, Arterial 7.35 - 7.45 - - 7.419  7.345  7.342  7.393  7.341  7.330  7.451   PCO2 arterial 32 - 48 mmHg - - 46.3  44.0  51.9  44.5  52.5  46.9  32.0   Bicarbonate 20.0 - 28.0 mmol/L - - 29.9  30.9  24.2  28.1  27.2  28.4  24.8  22.3   TCO2 22 - 32 mmol/L - - 31  32  26  27  30  30   32  29  28  30  30  26  23    Acid-base deficit 0.0 - 2.0 mmol/L - - - 2.0  1.0  1.0   O2 Saturation % - - 98  75  94  100  100   100  97  98     Details       Multiple values from one day are sorted in reverse-chronological order         Capillary Blood Glucose: Lab Results  Component Value Date   GLUCAP 109 (H) 10/15/2023   GLUCAP 139 (H) 10/15/2023   GLUCAP 107 (H) 10/11/2023   GLUCAP 157 (H) 08/27/2023   GLUCAP 249 (H) 08/26/2023     Exercise Target Goals: Exercise Program Goal: Individual exercise prescription set using results from initial 6 min walk test and THRR while considering  patient's activity barriers and safety.   Exercise Prescription Goal: Initial exercise prescription builds to 30-45 minutes a day of aerobic activity, 2-3 days per week.  Home exercise guidelines will be given to patient during program as part of exercise prescription that the participant will acknowledge.  Activity Barriers & Risk Stratification:  Activity Barriers & Cardiac Risk Stratification - 10/11/23 1337       Activity Barriers & Cardiac Risk Stratification   Activity Barriers Balance Concerns;History of Falls;Joint Problems;Arthritis;Back Problems;Deconditioning;Neck/Spine Problems;Shortness of Breath;Other (comment)    Comments lightheadedness    Cardiac Risk Stratification High          6 Minute Walk:  6 Minute Walk     Row Name 10/11/23 1342         6 Minute Walk   Phase Initial     Distance 720 feet     Walk Time 6 minutes     # of Rest Breaks 1  5:08-6:00  MPH 1.4     METS 1.23     RPE 14     Perceived Dyspnea  1     VO2 Peak 4.3     Symptoms No     Resting HR 63 bpm     Resting BP 114/72     Resting Oxygen  Saturation  97 %     Exercise Oxygen  Saturation  during 6 min walk 98 %     Max Ex. HR 73 bpm     Max Ex. BP 122/74     2 Minute Post BP 120/74        Oxygen  Initial Assessment:   Oxygen  Re-Evaluation:   Oxygen  Discharge (Final Oxygen  Re-Evaluation):   Initial Exercise Prescription:  Initial Exercise Prescription - 10/11/23 1300       Date of Initial Exercise  RX and Referring Provider   Date 10/11/23    Referring Provider Ozell Fell, MD    Expected Discharge Date 01/02/24      Recumbant Bike   Level 1    RPM 60    Watts 25    Minutes 15    METs 1.2      NuStep   Level 1    SPM 75    Minutes 15    METs 1.2      Prescription Details   Frequency (times per week) 3    Duration Progress to 30 minutes of continuous aerobic without signs/symptoms of physical distress      Intensity   THRR 40-80% of Max Heartrate 59-118    Ratings of Perceived Exertion 11-13    Perceived Dyspnea 0-4      Progression   Progression Continue progressive overload as per policy without signs/symptoms or physical distress.      Resistance Training   Training Prescription Yes    Weight 3 lbs    Reps 10-15          Perform Capillary Blood Glucose checks as needed.  Exercise Prescription Changes:   Exercise Prescription Changes     Row Name 10/15/23 1637 10/31/23 1630 11/05/23 1635         Response to Exercise   Blood Pressure (Admit) 154/72 130/58 136/56     Blood Pressure (Exercise) 154/70 128/74 154/62     Blood Pressure (Exit) 112/72 140/75 124/68     Heart Rate (Admit) 68 bpm 65 bpm 65 bpm     Heart Rate (Exercise) 78 bpm 73 bpm 79 bpm     Heart Rate (Exit) 70 bpm 66 bpm 67 bpm     Rating of Perceived Exertion (Exercise) 12 11 11      Perceived Dyspnea (Exercise) 0 0 0     Symptoms Dizzy during wts, resolved -- --     Comments Pt first day in the [facility] program Reviewed MET's, goals and home ExRx Reviewed home ExRx     Duration Progress to 30 minutes of  aerobic without signs/symptoms of physical distress Progress to 30 minutes of  aerobic without signs/symptoms of physical distress Progress to 30 minutes of  aerobic without signs/symptoms of physical distress     Intensity THRR unchanged THRR unchanged THRR unchanged       Progression   Progression Continue to progress workloads to maintain intensity without signs/symptoms of  physical distress. Continue to progress workloads to maintain intensity without signs/symptoms of physical distress. Continue to progress workloads to maintain intensity without signs/symptoms of physical distress.     Average METs 1.55  1.8 1.95       Resistance Training   Training Prescription Yes No Yes     Weight 3 lbs 3 lbs 4 lbs     Reps 10-15 10-15 10-15     Time 10 Minutes 10 Minutes 10 Minutes       Recumbant Bike   Level 1 2 2      RPM 34 56 55     Watts 8 13 15      Minutes 15 15 15      METs 1.7 1.9 2.2       NuStep   Level 1 1 1      SPM 53 63 70     Minutes 15 15 15      METs 1.4 1.7 1.7       Home Exercise Plan   Plans to continue exercise at -- -- [employer] (comment)     Frequency -- -- Add 2 additional days to program exercise sessions.     Initial Home Exercises Provided -- -- 11/05/23        Exercise Comments:   Exercise Comments     Row Name 10/15/23 1644 10/31/23 1630 11/01/23 1004 11/05/23 1638     Exercise Comments Pt first day in the [facility] program. Pt tolerated exercise well with some mild dizziness post exercise that quickly resolved. Average MET's today 1.55. He is off to a good start and is learning his THRR, RPE and ExRx Reviewed MET's and goals. Pt tolerated exercise well with an average MET level of 1.8. Pt is doing well and progressing MET's. talked today about increasing SPM on the nustep and he increased his WL on the [facility] Bike. Overall doing well. Will go over home exercise soon. He has a memebership to [employer] and [employer] Reviewed MET's and goals. Pt tolerated exercise well with an average MET level of 1.8. Pt is doing well and progressing MET's. talked today about increasing SPM on the nustep and he increased his WL on the [facility] Bike. Overall doing well. Will go over home exercise soon. He has a memebership to [employer] and [employer] Reviewed home ExRx. Pt tolerated exercise well with an average MET level of 1.95. Pt is doing well and  progressing MET's gradually. He is going to exercise on his own by going to [employer], [employer] [facility], walking and weights. Talked about gradual weight lifting progression since he is off his sternal precautions. He plans to exercise 2 days for 30-45 mins (broken up into 10 mis sessions as needed).       Exercise Goals and Review:   Exercise Goals     Row Name 10/11/23 1110             Exercise Goals   Increase Physical Activity Yes       Intervention Provide advice, education, support and counseling about physical activity/exercise needs.;Develop an individualized exercise prescription for aerobic and resistive training based on initial evaluation findings, risk stratification, comorbidities and participant's personal goals.       Expected Outcomes Short Term: Attend rehab on a regular basis to increase amount of physical activity.;Long Term: Exercising regularly at least 3-5 days a week.;Long Term: Add in home exercise to make exercise part of routine and to increase amount of physical activity.       Increase Strength and Stamina Yes       Intervention Provide advice, education, support and counseling about physical activity/exercise needs.;Develop an individualized exercise prescription for aerobic and  resistive training based on initial evaluation findings, risk stratification, comorbidities and participant's personal goals.       Expected Outcomes Short Term: Increase workloads from initial exercise prescription for resistance, speed, and METs.;Short Term: Perform resistance training exercises routinely during rehab and add in resistance training at home;Long Term: Improve cardiorespiratory fitness, muscular endurance and strength as measured by increased METs and functional capacity ( )       Able to understand and use rate of perceived exertion (RPE) scale Yes       Intervention Provide education and explanation on how to use RPE scale       Expected Outcomes Short Term: Able to  use RPE daily in rehab to express subjective intensity level;Long Term:  Able to use RPE to guide intensity level when exercising independently       Knowledge and understanding of Target Heart Rate Range (THRR) Yes       Intervention Provide education and explanation of THRR including how the numbers were predicted and where they are located for reference       Expected Outcomes Short Term: Able to state/look up THRR;Long Term: Able to use THRR to govern intensity when exercising independently;Short Term: Able to use daily as guideline for intensity in rehab       Understanding of Exercise Prescription Yes       Intervention Provide education, explanation, and written materials on patient's individual exercise prescription       Expected Outcomes Short Term: Able to explain program exercise prescription;Long Term: Able to explain home exercise prescription to exercise independently          Exercise Goals Re-Evaluation :  Exercise Goals Re-Evaluation     Row Name 10/15/23 1642 10/31/23 1630           Exercise Goal Re-Evaluation   Exercise Goals Review Increase Physical Activity;Understanding of Exercise Prescription;Increase Strength and Stamina;Knowledge and understanding of Target Heart Rate Range (THRR);Able to understand and use rate of perceived exertion (RPE) scale Increase Physical Activity;Understanding of Exercise Prescription;Increase Strength and Stamina;Knowledge and understanding of Target Heart Rate Range (THRR);Able to understand and use rate of perceived exertion (RPE) scale      Comments Pt first day in the [facility] program. Pt tolerated exercise well with some mild dizziness post exercise that quickly resolved. Average MET's today 1.55. He is off to a good start and is learning his THRR, RPE and ExRx Reviewed MET's and goals. Pt tolerated exercise well with an average MET level of 1.8. Pt is doing well and progressing MET's. talked today about increasing SPM on the nustep  and he increased his WL on the [facility] Bike. Overall doing well. Will go over home exercise soon. He has a memebership to [employer] and [employer]      Expected Outcomes Will continue to monitor pt and progress workloads as tolerated without sign or symptom Will continue to monitor pt and progress workloads as tolerated without sign or symptom         Discharge Exercise Prescription (Final Exercise Prescription Changes):  Exercise Prescription Changes - 11/05/23 1635       Response to Exercise   Blood Pressure (Admit) 136/56    Blood Pressure (Exercise) 154/62    Blood Pressure (Exit) 124/68    Heart Rate (Admit) 65 bpm    Heart Rate (Exercise) 79 bpm    Heart Rate (Exit) 67 bpm    Rating of Perceived Exertion (Exercise) 11    Perceived Dyspnea (Exercise)  0    Comments Reviewed home ExRx    Duration Progress to 30 minutes of  aerobic without signs/symptoms of physical distress    Intensity THRR unchanged      Progression   Progression Continue to progress workloads to maintain intensity without signs/symptoms of physical distress.    Average METs 1.95      Resistance Training   Training Prescription Yes    Weight 4 lbs    Reps 10-15    Time 10 Minutes      Recumbant Bike   Level 2    RPM 55    Watts 15    Minutes 15    METs 2.2      NuStep   Level 1    SPM 70    Minutes 15    METs 1.7      Home Exercise Plan   Plans to continue exercise at [employer] (comment)    Frequency Add 2 additional days to program exercise sessions.    Initial Home Exercises Provided 11/05/23          Nutrition:  Target Goals: Understanding of nutrition guidelines, daily intake of sodium 1500mg , cholesterol 200mg , calories 30% from fat and 7% or less from saturated fats, daily to have 5 or more servings of fruits and vegetables.  Biometrics:  Pre Biometrics - 10/11/23 1100       Pre Biometrics   Waist Circumference 51.25 inches    Hip Circumference 45 inches    Waist to Hip  Ratio 1.14 %    Triceps Skinfold 25 mm    % Body Fat 31.7 %    Grip Strength 14 kg    Flexibility --   No performed; active low back pain 4/10   Single Leg Stand 1 seconds           Nutrition Therapy Plan and Nutrition Goals:  Nutrition Therapy & Goals - 10/16/23 1046       Nutrition Therapy   Diet Heart healthy diet      Personal Nutrition Goals   Nutrition Goal Patient to identify strategies for reducing cardiovascular risk by attending the [facility] education and nutrition series weekly.    Personal Goal #2 Patient to improve diet quality by using the plate method as a guide for meal planning to include lean protein/plant protein, fruits, vegetables, whole grains, nonfat dairy as part of a well-balanced diet.    Comments Patient has medical history of CABGx3, DM2, PAF, hyperlipidemia, HTN, former smoker, COPD, NSTEMI. LDL is not at goal; he continues praluent as he is statin intolerant. A1c is well controlled in a prediabetic range. Patient will benefit from participation in intensive cardiac rehab for nutrition education, exercise, and lifestyle modification.      Intervention Plan   Intervention Prescribe, educate and counsel regarding individualized specific dietary modifications aiming towards targeted core components such as weight, hypertension, lipid management, diabetes, heart failure and other comorbidities.;Nutrition handout(s) given to patient.    Expected Outcomes Short Term Goal: Understand basic principles of dietary content, such as calories, fat, sodium, cholesterol and nutrients.;Long Term Goal: Adherence to prescribed nutrition plan.          Nutrition Assessments:  Nutrition Assessments - 10/19/23 1701       MEDFICTS Scores   Pre Score 64         MEDIFICTS Score Key: >=70 Need to make dietary changes  40-70 Heart Healthy Diet <= 40 Therapeutic Level Cholesterol Diet   Flowsheet Row  INTENSIVE CARDIAC REHAB from 10/22/2023 in [facility]  Picture Your Plate Total Score on Admission 64   Picture Your Plate Scores: <59 Unhealthy dietary pattern with much room for improvement. 41-50 Dietary pattern unlikely to meet recommendations for good health and room for improvement. 51-60 More healthful dietary pattern, with some room for improvement.  >60 Healthy dietary pattern, although there may be some specific behaviors that could be improved.    Nutrition Goals Re-Evaluation:  Nutrition Goals Re-Evaluation     Row Name 10/16/23 1046             Goals   Current Weight 246 lb 4.1 oz (111.7 kg)       Comment cholesterol 214, LDL 154, HDL 44       Expected Outcome Patient has medical history of CABGx3, DM2, PAF, hyperlipidemia, HTN, former smoker, COPD, NSTEMI. LDL is not at goal; he continues praluent as he is statin intolerant. A1c is well controlled in a prediabetic range. Patient will benefit from participation in intensive cardiac rehab for nutrition education, exercise, and lifestyle modification.          Nutrition Goals Re-Evaluation:  Nutrition Goals Re-Evaluation     Row Name 10/16/23 1046             Goals   Current Weight 246 lb 4.1 oz (111.7 kg)       Comment cholesterol 214, LDL 154, HDL 44       Expected Outcome Patient has medical history of CABGx3, DM2, PAF, hyperlipidemia, HTN, former smoker, COPD, NSTEMI. LDL is not at goal; he continues praluent as he is statin intolerant. A1c is well controlled in a prediabetic range. Patient will benefit from participation in intensive cardiac rehab for nutrition education, exercise, and lifestyle modification.          Nutrition Goals Discharge (Final Nutrition Goals Re-Evaluation):  Nutrition Goals Re-Evaluation - 10/16/23 1046       Goals   Current Weight 246 lb 4.1 oz (111.7 kg)    Comment cholesterol 214, LDL 154, HDL 44    Expected Outcome Patient has medical history of CABGx3, DM2, PAF,  hyperlipidemia, HTN, former smoker, COPD, NSTEMI. LDL is not at goal; he continues praluent as he is statin intolerant. A1c is well controlled in a prediabetic range. Patient will benefit from participation in intensive cardiac rehab for nutrition education, exercise, and lifestyle modification.          Psychosocial: Target Goals: Acknowledge presence or absence of significant depression and/or stress, maximize coping skills, provide positive support system. Participant is able to verbalize types and ability to use techniques and skills needed for reducing stress and depression.  Initial Review & Psychosocial Screening:  Initial Psych Review & Screening - 10/11/23 1308       Initial Review   Current issues with Current Stress Concerns;Current Anxiety/Panic;Current Sleep Concerns;Current Psychotropic Meds   related to health   Source of Stress Concerns Financial;Family      Family Dynamics   Good Support System? Yes    Comments Pt has good support system with his wife and daughters      Barriers   Psychosocial barriers to participate in program The patient should benefit from training in stress management and relaxation.;There are no identifiable barriers or psychosocial needs.      Screening Interventions   Interventions Encouraged to exercise;Provide feedback about the scores to participant    Expected Outcomes Long  Term Goal: Stressors or current issues are controlled or eliminated.;Short Term goal: Identification and review with participant of any Quality of Life or Depression concerns found by scoring the questionnaire.;Long Term goal: The participant improves quality of Life and PHQ9 Scores as seen by post scores and/or verbalization of changes          Quality of Life Scores:  Quality of Life - 10/11/23 1249       Quality of Life   Select Quality of Life      Quality of Life Scores   Health/Function Pre 12.93 %    Socioeconomic Pre 25.71 %    Psych/Spiritual Pre 23 %     Family Pre 28.8 %    GLOBAL Pre 19.97 %         Scores of 19 and below usually indicate a poorer quality of life in these areas.  A difference of  2-3 points is a clinically meaningful difference.  A difference of 2-3 points in the total score of the Quality of Life Index has been associated with significant improvement in overall quality of life, self-image, physical symptoms, and general health in studies assessing change in quality of life.  PHQ-9: Review Flowsheet       10/11/2023 10/17/2014  Depression screen PHQ 2/9  Decreased Interest 0 0  Down, Depressed, Hopeless 0 0  PHQ - 2 Score 0 0  Altered sleeping 2 -  Tired, decreased energy 2 -  Change in appetite 1 -  Feeling bad or failure about yourself  1 -  Trouble concentrating 0 -  Moving slowly or fidgety/restless 0 -  Suicidal thoughts 0 -  PHQ-9 Score 6 -  Difficult doing work/chores Not difficult at all -   Interpretation of Total Score  Total Score Depression Severity:  1-4 = Minimal depression, 5-9 = Mild depression, 10-14 = Moderate depression, 15-19 = Moderately severe depression, 20-27 = Severe depression   Psychosocial Evaluation and Intervention:   Psychosocial Re-Evaluation:  Psychosocial Re-Evaluation     Row Name 11/06/23 1652             Psychosocial Re-Evaluation   Current issues with Current Stress Concerns;Current Psychotropic Meds;Current Anxiety/Panic       Comments Dick has not voiced any increased concerns or stressors during exercise at cardiac rehab. will review PHQ9 and quality of life questionnaire in the near future.       Expected Outcomes Dick will have controlled or decreased anxiety/ stressors upon compleiton of cardiac rehab.       Interventions Stress management education;Relaxation education;Encouraged to attend Cardiac Rehabilitation for the exercise       Continue Psychosocial Services  Follow up required by staff         Initial Review   Source of Stress Concerns  Financial;Chronic Illness;Family       Comments Will continue to monitor and offer support as needed.          Psychosocial Discharge (Final Psychosocial Re-Evaluation):  Psychosocial Re-Evaluation - 11/06/23 1652       Psychosocial Re-Evaluation   Current issues with Current Stress Concerns;Current Psychotropic Meds;Current Anxiety/Panic    Comments Dick has not voiced any increased concerns or stressors during exercise at cardiac rehab. will review PHQ9 and quality of life questionnaire in the near future.    Expected Outcomes Dick will have controlled or decreased anxiety/ stressors upon compleiton of cardiac rehab.    Interventions Stress management education;Relaxation education;Encouraged to attend Cardiac Rehabilitation for the  exercise    Continue Psychosocial Services  Follow up required by staff      Initial Review   Source of Stress Concerns Financial;Chronic Illness;Family    Comments Will continue to monitor and offer support as needed.          Vocational Rehabilitation: Provide vocational rehab assistance to qualifying candidates.   Vocational Rehab Evaluation & Intervention:  Vocational Rehab - 10/11/23 1355       Initial Vocational Rehab Evaluation & Intervention   Assessment shows need for Vocational Rehabilitation No   Pt is retired         Education: Education Goals: Education classes will be provided on a weekly basis, covering required topics. Participant will state understanding/return demonstration of topics presented.    Education     Row Name 10/15/23 1400     Education   Cardiac Education Topics Pritikin   Select Workshops     Workshops   Educator Exercise Physiologist   Select Psychosocial   Psychosocial Workshop Recognizing and Reducing Stress   Instruction Review Code 1- Verbalizes Understanding   Class Start Time 1405   Class Stop Time 1448   Class Time Calculation (min) 43 min    Row Name 10/17/23 1400     Education    Cardiac Education Topics Pritikin   Customer service manager   Weekly Topic [employer] Desserts   Instruction Review Code 1- Verbalizes Understanding   Class Start Time 1400   Class Stop Time 1450   Class Time Calculation (min) 50 min    Row Name 10/19/23 1500     Education   Cardiac Education Topics Pritikin   Licensed conveyancer Nutrition   Nutrition Calorie Density   Instruction Review Code 1- Verbalizes Understanding   Class Start Time 1400   Class Stop Time 1435   Class Time Calculation (min) 35 min    Row Name 10/22/23 1400     Education   Cardiac Education Topics Pritikin   Geographical information systems officer Exercise   Exercise Workshop Exercise Basics: Diplomatic Services operational officer   Instruction Review Code 1- Verbalizes Understanding   Class Start Time 1420   Class Stop Time 1500   Class Time Calculation (min) 40 min    Row Name 10/24/23 1100     Education   Cardiac Education Topics Pritikin   Customer service manager   Weekly Topic Efficiency Cooking - Meals in a Snap   Instruction Review Code 1- Verbalizes Understanding   Class Start Time 1145   Class Stop Time 1230   Class Time Calculation (min) 45 min    Row Name 10/26/23 1400     Education   Cardiac Education Topics Pritikin   Psychologist, forensic Exercise Education   Exercise Education Move It!   Instruction Review Code 1- Verbalizes Understanding   Class Start Time 1359   Class Stop Time 1432   Class Time Calculation (min) 33 min    Row Name 10/31/23 1400     Education   Cardiac Education Topics Pritikin   Customer service manager   Weekly Topic  One-Pot Wonders   Instruction Review Code 1- Verbalizes Understanding   Class Start  Time 1400   Class Stop Time 1440   Class Time Calculation (min) 40 min    Row Name 11/02/23 1500     Education   Cardiac Education Topics Pritikin   Writer General Education   General Education Hypertension and Heart Disease   Instruction Review Code 1- Verbalizes Understanding   Class Start Time 1403   Class Stop Time 1440   Class Time Calculation (min) 37 min    Row Name 11/05/23 1500     Education   Cardiac Education Topics Pritikin   Geographical information systems officer Psychosocial   Psychosocial Workshop Focused Goals, Sustainable Changes   Instruction Review Code 1- Verbalizes Understanding   Class Start Time 1359   Class Stop Time 1434   Class Time Calculation (min) 35 min      Core Videos: Exercise    Move It!  Clinical staff conducted group or individual video education with verbal and written material and guidebook.  Patient learns the recommended Pritikin exercise program. Exercise with the goal of living a long, healthy life. Some of the health benefits of exercise include controlled diabetes, healthier blood pressure levels, improved cholesterol levels, improved heart and lung capacity, improved sleep, and better body composition. Everyone should speak with their doctor before starting or changing an exercise routine.  Biomechanical Limitations Clinical staff conducted group or individual video education with verbal and written material and guidebook.  Patient learns how biomechanical limitations can impact exercise and how we can mitigate and possibly overcome limitations to have an impactful and balanced exercise routine.  Body Composition Clinical staff conducted group or individual video education with verbal and written material and guidebook.  Patient learns that body composition (ratio of muscle mass to fat mass) is a key component to assessing overall  fitness, rather than body weight alone. Increased fat mass, especially visceral belly fat, can put us  at increased risk for metabolic syndrome, type 2 diabetes, heart disease, and even death. It is recommended to combine diet and exercise (cardiovascular and resistance training) to improve your body composition. Seek guidance from your physician and exercise physiologist before implementing an exercise routine.  Exercise Action Plan Clinical staff conducted group or individual video education with verbal and written material and guidebook.  Patient learns the recommended strategies to achieve and enjoy long-term exercise adherence, including variety, self-motivation, self-efficacy, and positive decision making. Benefits of exercise include fitness, good health, weight management, more energy, better sleep, less stress, and overall well-being.  Medical   Heart Disease Risk Reduction Clinical staff conducted group or individual video education with verbal and written material and guidebook.  Patient learns our heart is our most vital organ as it circulates oxygen , nutrients, white blood cells, and hormones throughout the entire body, and carries waste away. Data supports a plant-based eating plan like the Pritikin Program for its effectiveness in slowing progression of and reversing heart disease. The video provides a number of recommendations to address heart disease.   Metabolic Syndrome and Belly Fat  Clinical staff conducted group or individual video education with verbal and written material and guidebook.  Patient learns what metabolic syndrome is, how it leads to heart disease, and how one can reverse it and keep it from coming back. You have metabolic  syndrome if you have 3 of the following 5 criteria: abdominal obesity, high blood pressure, high triglycerides, low HDL cholesterol, and high blood sugar.  Hypertension and Heart Disease Clinical staff conducted group or individual video education  with verbal and written material and guidebook.  Patient learns that high blood pressure, or hypertension, is very common in the [place] . Hypertension is largely due to excessive salt intake, but other important risk factors include being overweight, physical inactivity, drinking too much alcohol, smoking, and not eating enough potassium from fruits and vegetables. High blood pressure is a leading risk factor for heart attack, stroke, congestive heart failure, dementia, kidney failure, and premature death. Long-term effects of excessive salt intake include stiffening of the arteries and thickening of heart muscle and organ damage. Recommendations include ways to reduce hypertension and the risk of heart disease.  Diseases of Our Time - Focusing on Diabetes Clinical staff conducted group or individual video education with verbal and written material and guidebook.  Patient learns why the best way to stop diseases of our time is prevention, through food and other lifestyle changes. Medicine (such as prescription pills and surgeries) is often only a Band-Aid on the problem, not a long-term solution. Most common diseases of our time include obesity, type 2 diabetes, hypertension, heart disease, and cancer. The Pritikin Program is recommended and has been proven to help reduce, reverse, and/or prevent the damaging effects of metabolic syndrome.  Nutrition   Overview of the Pritikin Eating Plan  Clinical staff conducted group or individual video education with verbal and written material and guidebook.  Patient learns about the Pritikin Eating Plan for disease risk reduction. The Pritikin Eating Plan emphasizes a wide variety of unrefined, minimally-processed carbohydrates, like fruits, vegetables, whole grains, and legumes. Go, Caution, and Stop food choices are explained. Plant-based and lean animal proteins are emphasized. Rationale provided for low sodium intake for blood pressure control, low added  sugars for blood sugar stabilization, and low added fats and oils for coronary artery disease risk reduction and weight management.  Calorie Density  Clinical staff conducted group or individual video education with verbal and written material and guidebook.  Patient learns about calorie density and how it impacts the Pritikin Eating Plan. Knowing the characteristics of the food you choose will help you decide whether those foods will lead to weight gain or weight loss, and whether you want to consume more or less of them. Weight loss is usually a side effect of the Pritikin Eating Plan because of its focus on low calorie-dense foods.  Label Reading  Clinical staff conducted group or individual video education with verbal and written material and guidebook.  Patient learns about the Pritikin recommended label reading guidelines and corresponding recommendations regarding calorie density, added sugars, sodium content, and whole grains.  Dining Out - Part 1  Clinical staff conducted group or individual video education with verbal and written material and guidebook.  Patient learns that restaurant meals can be sabotaging because they can be so high in calories, fat, sodium, and/or sugar. Patient learns recommended strategies on how to positively address this and avoid unhealthy pitfalls.  Facts on Fats  Clinical staff conducted group or individual video education with verbal and written material and guidebook.  Patient learns that lifestyle modifications can be just as effective, if not more so, as many medications for lowering your risk of heart disease. A Pritikin lifestyle can help to reduce your risk of inflammation and atherosclerosis (cholesterol build-up, or plaque, in  the artery walls). Lifestyle interventions such as dietary choices and physical activity address the cause of atherosclerosis. A review of the types of fats and their impact on blood cholesterol levels, along with dietary  recommendations to reduce fat intake is also included.  Nutrition Action Plan  Clinical staff conducted group or individual video education with verbal and written material and guidebook.  Patient learns how to incorporate Pritikin recommendations into their lifestyle. Recommendations include planning and keeping personal health goals in mind as an important part of their success.  Healthy Mind-Set    Healthy Minds, Bodies, Hearts  Clinical staff conducted group or individual video education with verbal and written material and guidebook.  Patient learns how to identify when they are stressed. Video will discuss the impact of that stress, as well as the many benefits of stress management. Patient will also be introduced to stress management techniques. The way we think, act, and feel has an impact on our hearts.  How Our Thoughts Can Heal Our Hearts  Clinical staff conducted group or individual video education with verbal and written material and guidebook.  Patient learns that negative thoughts can cause depression and anxiety. This can result in negative lifestyle behavior and serious health problems. Cognitive behavioral therapy is an effective method to help control our thoughts in order to change and improve our emotional outlook.  Additional Videos:  Exercise    Improving Performance  Clinical staff conducted group or individual video education with verbal and written material and guidebook.  Patient learns to use a non-linear approach by alternating intensity levels and lengths of time spent exercising to help burn more calories and lose more body fat. Cardiovascular exercise helps improve heart health, metabolism, hormonal balance, blood sugar control, and recovery from fatigue. Resistance training improves strength, endurance, balance, coordination, reaction time, metabolism, and muscle mass. Flexibility exercise improves circulation, posture, and balance. Seek guidance from your  physician and exercise physiologist before implementing an exercise routine and learn your capabilities and proper form for all exercise.  Introduction to Yoga  Clinical staff conducted group or individual video education with verbal and written material and guidebook.  Patient learns about yoga, a discipline of the coming together of mind, breath, and body. The benefits of yoga include improved flexibility, improved range of motion, better posture and core strength, increased lung function, weight loss, and positive self-image. Yoga's heart health benefits include lowered blood pressure, healthier heart rate, decreased cholesterol and triglyceride levels, improved immune function, and reduced stress. Seek guidance from your physician and exercise physiologist before implementing an exercise routine and learn your capabilities and proper form for all exercise.  Medical   Aging: Enhancing Your Quality of Life  Clinical staff conducted group or individual video education with verbal and written material and guidebook.  Patient learns key strategies and recommendations to stay in good physical health and enhance quality of life, such as prevention strategies, having an advocate, securing a Health Care Proxy and Power of Attorney, and keeping a list of medications and system for tracking them. It also discusses how to avoid risk for bone loss.  Biology of Weight Control  Clinical staff conducted group or individual video education with verbal and written material and guidebook.  Patient learns that weight gain occurs because we consume more calories than we burn (eating more, moving less). Even if your body weight is normal, you may have higher ratios of fat compared to muscle mass. Too much body fat puts you at increased risk  for cardiovascular disease, heart attack, stroke, type 2 diabetes, and obesity-related cancers. In addition to exercise, following the Pritikin Eating Plan can help reduce your  risk.  Decoding Lab Results  Clinical staff conducted group or individual video education with verbal and written material and guidebook.  Patient learns that lab test reflects one measurement whose values change over time and are influenced by many factors, including medication, stress, sleep, exercise, food, hydration, pre-existing medical conditions, and more. It is recommended to use the knowledge from this video to become more involved with your lab results and evaluate your numbers to speak with your doctor.   Diseases of Our Time - Overview  Clinical staff conducted group or individual video education with verbal and written material and guidebook.  Patient learns that according to the CDC, 50% to 70% of chronic diseases (such as obesity, type 2 diabetes, elevated lipids, hypertension, and heart disease) are avoidable through lifestyle improvements including healthier food choices, listening to satiety cues, and increased physical activity.  Sleep Disorders Clinical staff conducted group or individual video education with verbal and written material and guidebook.  Patient learns how good quality and duration of sleep are important to overall health and well-being. Patient also learns about sleep disorders and how they impact health along with recommendations to address them, including discussing with a physician.  Nutrition  Dining Out - Part 2 Clinical staff conducted group or individual video education with verbal and written material and guidebook.  Patient learns how to plan ahead and communicate in order to maximize their dining experience in a healthy and nutritious manner. Included are recommended food choices based on the type of restaurant the patient is visiting.   Fueling a Banker conducted group or individual video education with verbal and written material and guidebook.  There is a strong connection between our food choices and our health. Diseases  like obesity and type 2 diabetes are very prevalent and are in large-part due to lifestyle choices. The Pritikin Eating Plan provides plenty of food and hunger-curbing satisfaction. It is easy to follow, affordable, and helps reduce health risks.  Menu Workshop  Clinical staff conducted group or individual video education with verbal and written material and guidebook.  Patient learns that restaurant meals can sabotage health goals because they are often packed with calories, fat, sodium, and sugar. Recommendations include strategies to plan ahead and to communicate with the manager, chef, or server to help order a healthier meal.  Planning Your Eating Strategy  Clinical staff conducted group or individual video education with verbal and written material and guidebook.  Patient learns about the Pritikin Eating Plan and its benefit of reducing the risk of disease. The Pritikin Eating Plan does not focus on calories. Instead, it emphasizes high-quality, nutrient-rich foods. By knowing the characteristics of the foods, we choose, we can determine their calorie density and make informed decisions.  Targeting Your Nutrition Priorities  Clinical staff conducted group or individual video education with verbal and written material and guidebook.  Patient learns that lifestyle habits have a tremendous impact on disease risk and progression. This video provides eating and physical activity recommendations based on your personal health goals, such as reducing LDL cholesterol, losing weight, preventing or controlling type 2 diabetes, and reducing high blood pressure.  Vitamins and Minerals  Clinical staff conducted group or individual video education with verbal and written material and guidebook.  Patient learns different ways to obtain key vitamins and minerals, including through  a recommended healthy diet. It is important to discuss all supplements you take with your doctor.   Healthy Mind-Set    Smoking  Cessation  Clinical staff conducted group or individual video education with verbal and written material and guidebook.  Patient learns that cigarette smoking and tobacco addiction pose a serious health risk which affects millions of people. Stopping smoking will significantly reduce the risk of heart disease, lung disease, and many forms of cancer. Recommended strategies for quitting are covered, including working with your doctor to develop a successful plan.  Culinary   Becoming a Set designer conducted group or individual video education with verbal and written material and guidebook.  Patient learns that cooking at home can be healthy, cost-effective, quick, and puts them in control. Keys to cooking healthy recipes will include looking at your recipe, assessing your equipment needs, planning ahead, making it simple, choosing cost-effective seasonal ingredients, and limiting the use of added fats, salts, and sugars.  Cooking - Breakfast and Snacks  Clinical staff conducted group or individual video education with verbal and written material and guidebook.  Patient learns how important breakfast is to satiety and nutrition through the entire day. Recommendations include key foods to eat during breakfast to help stabilize blood sugar levels and to prevent overeating at meals later in the day. Planning ahead is also a key component.  Cooking - Educational psychologist conducted group or individual video education with verbal and written material and guidebook.  Patient learns eating strategies to improve overall health, including an approach to cook more at home. Recommendations include thinking of animal protein as a side on your plate rather than center stage and focusing instead on lower calorie dense options like vegetables, fruits, whole grains, and plant-based proteins, such as beans. Making sauces in large quantities to freeze for later and leaving the skin on your  vegetables are also recommended to maximize your experience.  Cooking - Healthy Salads and Dressing Clinical staff conducted group or individual video education with verbal and written material and guidebook.  Patient learns that vegetables, fruits, whole grains, and legumes are the foundations of the Pritikin Eating Plan. Recommendations include how to incorporate each of these in flavorful and healthy salads, and how to create homemade salad dressings. Proper handling of ingredients is also covered. Cooking - Soups and [employer] - Soups and Desserts Clinical staff conducted group or individual video education with verbal and written material and guidebook.  Patient learns that Pritikin soups and desserts make for easy, nutritious, and delicious snacks and meal components that are low in sodium, fat, sugar, and calorie density, while high in vitamins, minerals, and filling fiber. Recommendations include simple and healthy ideas for soups and desserts.   Overview     The Pritikin Solution Program Overview Clinical staff conducted group or individual video education with verbal and written material and guidebook.  Patient learns that the results of the Pritikin Program have been documented in more than 100 articles published in peer-reviewed journals, and the benefits include reducing risk factors for (and, in some cases, even reversing) high cholesterol, high blood pressure, type 2 diabetes, obesity, and more! An overview of the three key pillars of the Pritikin Program will be covered: eating well, doing regular exercise, and having a healthy mind-set.  WORKSHOPS  Exercise: Exercise Basics: Building Your Action Plan Clinical staff led group instruction and group discussion with PowerPoint presentation and patient guidebook. To enhance the learning  environment the use of posters, models and videos may be added. At the conclusion of this workshop, patients will comprehend the  difference between physical activity and exercise, as well as the benefits of incorporating both, into their routine. Patients will understand the FITT (Frequency, Intensity, Time, and Type) principle and how to use it to build an exercise action plan. In addition, safety concerns and other considerations for exercise and cardiac rehab will be addressed by the presenter. The purpose of this lesson is to promote a comprehensive and effective weekly exercise routine in order to improve patients' overall level of fitness.   Managing Heart Disease: Your Path to a Healthier Heart Clinical staff led group instruction and group discussion with PowerPoint presentation and patient guidebook. To enhance the learning environment the use of posters, models and videos may be added.At the conclusion of this workshop, patients will understand the anatomy and physiology of the heart. Additionally, they will understand how Pritikin's three pillars impact the risk factors, the progression, and the management of heart disease.  The purpose of this lesson is to provide a high-level overview of the heart, heart disease, and how the Pritikin lifestyle positively impacts risk factors.  Exercise Biomechanics Clinical staff led group instruction and group discussion with PowerPoint presentation and patient guidebook. To enhance the learning environment the use of posters, models and videos may be added. Patients will learn how the structural parts of their bodies function and how these functions impact their daily activities, movement, and exercise. Patients will learn how to promote a neutral spine, learn how to manage pain, and identify ways to improve their physical movement in order to promote healthy living. The purpose of this lesson is to expose patients to common physical limitations that impact physical activity. Participants will learn practical ways to adapt and manage aches and pains, and to minimize their  effect on regular exercise. Patients will learn how to maintain good posture while sitting, walking, and lifting.  Balance Training and Fall Prevention  Clinical staff led group instruction and group discussion with PowerPoint presentation and patient guidebook. To enhance the learning environment the use of posters, models and videos may be added. At the conclusion of this workshop, patients will understand the importance of their sensorimotor skills (vision, proprioception, and the vestibular system) in maintaining their ability to balance as they age. Patients will apply a variety of balancing exercises that are appropriate for their current level of function. Patients will understand the common causes for poor balance, possible solutions to these problems, and ways to modify their physical environment in order to minimize their fall risk. The purpose of this lesson is to teach patients about the importance of maintaining balance as they age and ways to minimize their risk of falling.  WORKSHOPS   Nutrition:  Fueling a Ship broker led group instruction and group discussion with PowerPoint presentation and patient guidebook. To enhance the learning environment the use of posters, models and videos may be added. Patients will review the foundational principles of the Pritikin Eating Plan and understand what constitutes a serving size in each of the food groups. Patients will also learn Pritikin-friendly foods that are better choices when away from home and review make-ahead meal and snack options. Calorie density will be reviewed and applied to three nutrition priorities: weight maintenance, weight loss, and weight gain. The purpose of this lesson is to reinforce (in a group setting) the key concepts around what patients are recommended to eat and how  to apply these guidelines when away from home by planning and selecting Pritikin-friendly options. Patients will understand how calorie  density may be adjusted for different weight management goals.  Mindful Eating  Clinical staff led group instruction and group discussion with PowerPoint presentation and patient guidebook. To enhance the learning environment the use of posters, models and videos may be added. Patients will briefly review the concepts of the Pritikin Eating Plan and the importance of low-calorie dense foods. The concept of mindful eating will be introduced as well as the importance of paying attention to internal hunger signals. Triggers for non-hunger eating and techniques for dealing with triggers will be explored. The purpose of this lesson is to provide patients with the opportunity to review the basic principles of the Pritikin Eating Plan, discuss the value of eating mindfully and how to measure internal cues of hunger and fullness using the Hunger Scale. Patients will also discuss reasons for non-hunger eating and learn strategies to use for controlling emotional eating.  Targeting Your Nutrition Priorities Clinical staff led group instruction and group discussion with PowerPoint presentation and patient guidebook. To enhance the learning environment the use of posters, models and videos may be added. Patients will learn how to determine their genetic susceptibility to disease by reviewing their family history. Patients will gain insight into the importance of diet as part of an overall healthy lifestyle in mitigating the impact of genetics and other environmental insults. The purpose of this lesson is to provide patients with the opportunity to assess their personal nutrition priorities by looking at their family history, their own health history and current risk factors. Patients will also be able to discuss ways of prioritizing and modifying the Pritikin Eating Plan for their highest risk areas  Menu  Clinical staff led group instruction and group discussion with PowerPoint presentation and patient guidebook. To  enhance the learning environment the use of posters, models and videos may be added. Using menus brought in from [employer], or printed from [employer], patients will apply the Pritikin dining out guidelines that were presented in the [employer] video. Patients will also be able to practice these guidelines in a variety of provided scenarios. The purpose of this lesson is to provide patients with the opportunity to practice hands-on learning of the Pritikin Dining Out guidelines with actual menus and practice scenarios.  Label Reading Clinical staff led group instruction and group discussion with PowerPoint presentation and patient guidebook. To enhance the learning environment the use of posters, models and videos may be added. Patients will review and discuss the Pritikin label reading guidelines presented in Pritikin's Label Reading Educational series video. Using fool labels brought in from local grocery stores and markets, patients will apply the label reading guidelines and determine if the packaged food meet the Pritikin guidelines. The purpose of this lesson is to provide patients with the opportunity to review, discuss, and practice hands-on learning of the Pritikin Label Reading guidelines with actual packaged food labels. Cooking School  Pritikin's [employer] are designed to teach patients ways to prepare quick, simple, and affordable recipes at home. The importance of nutrition's role in chronic disease risk reduction is reflected in its emphasis in the overall Pritikin program. By learning how to prepare essential core Pritikin Eating Plan recipes, patients will increase control over what they eat; be able to customize the flavor of foods without the use of added salt, sugar, or fat; and improve the quality of the food  they consume. By learning a set of core recipes which are easily assembled, quickly prepared, and affordable, patients are more likely to  prepare more healthy foods at home. These workshops focus on convenient breakfasts, simple entres, side dishes, and desserts which can be prepared with minimal effort and are consistent with nutrition recommendations for cardiovascular risk reduction. Cooking [employer] are taught by a Armed forces logistics/support/administrative officer (RD) who has been trained by the [employer]. The chef or RD has a clear understanding of the importance of minimizing - if not completely eliminating - added fat, sugar, and sodium in recipes. Throughout the series of Cooking School Workshop sessions, patients will learn about healthy ingredients and efficient methods of cooking to build confidence in their capability to prepare    Cooking School weekly topics:  Adding Flavor- Sodium-Free  Fast and Healthy Breakfasts  Powerhouse Plant-Based Proteins  Satisfying Salads and Dressings  Simple Sides and Sauces  International Cuisine-Spotlight on the [employer] Zones  Delicious Desserts  Savory Soups  [employer] - Meals in a Astronomer Appetizers and Snacks  Comforting Weekend Breakfasts  One-Pot Wonders   Fast Evening Meals  Landscape architect Your Pritikin Plate  WORKSHOPS   Healthy Mindset (Psychosocial):  Focused Goals, Sustainable Changes Clinical staff led group instruction and group discussion with PowerPoint presentation and patient guidebook. To enhance the learning environment the use of posters, models and videos may be added. Patients will be able to apply effective goal setting strategies to establish at least one personal goal, and then take consistent, meaningful action toward that goal. They will learn to identify common barriers to achieving personal goals and develop strategies to overcome them. Patients will also gain an understanding of how our mind-set can impact our ability to achieve goals and the importance of cultivating a positive and growth-oriented mind-set. The purpose  of this lesson is to provide patients with a deeper understanding of how to set and achieve personal goals, as well as the tools and strategies needed to overcome common obstacles which may arise along the way.  From Head to Heart: The Power of a Healthy Outlook  Clinical staff led group instruction and group discussion with PowerPoint presentation and patient guidebook. To enhance the learning environment the use of posters, models and videos may be added. Patients will be able to recognize and describe the impact of emotions and mood on physical health. They will discover the importance of self-care and explore self-care practices which may work for them. Patients will also learn how to utilize the 4 C's to cultivate a healthier outlook and better manage stress and challenges. The purpose of this lesson is to demonstrate to patients how a healthy outlook is an essential part of maintaining good health, especially as they continue their cardiac rehab journey.  Healthy Sleep for a Healthy Heart Clinical staff led group instruction and group discussion with PowerPoint presentation and patient guidebook. To enhance the learning environment the use of posters, models and videos may be added. At the conclusion of this workshop, patients will be able to demonstrate knowledge of the importance of sleep to overall health, well-being, and quality of life. They will understand the symptoms of, and treatments for, common sleep disorders. Patients will also be able to identify daytime and nighttime behaviors which impact sleep, and they will be able to apply these tools to help manage sleep-related challenges. The purpose of this lesson is to provide patients with a general overview  of sleep and outline the importance of quality sleep. Patients will learn about a few of the most common sleep disorders. Patients will also be introduced to the concept of "sleep hygiene," and discover ways to self-manage certain sleeping  problems through simple daily behavior changes. Finally, the workshop will motivate patients by clarifying the links between quality sleep and their goals of heart-healthy living.   Recognizing and Reducing Stress Clinical staff led group instruction and group discussion with PowerPoint presentation and patient guidebook. To enhance the learning environment the use of posters, models and videos may be added. At the conclusion of this workshop, patients will be able to understand the types of stress reactions, differentiate between acute and chronic stress, and recognize the impact that chronic stress has on their health. They will also be able to apply different coping mechanisms, such as reframing negative self-talk. Patients will have the opportunity to practice a variety of stress management techniques, such as deep abdominal breathing, progressive muscle relaxation, and/or guided imagery.  The purpose of this lesson is to educate patients on the role of stress in their lives and to provide healthy techniques for coping with it.  Learning Barriers/Preferences:  Learning Barriers/Preferences - 10/11/23 1354       Learning Barriers/Preferences   Learning Barriers Sight;Hearing    Learning Preferences Video;Pictoral;Computer/Internet          Education Topics:  Knowledge Questionnaire Score:  Knowledge Questionnaire Score - 10/11/23 1250       Knowledge Questionnaire Score   Pre Score 21/24          Core Components/Risk Factors/Patient Goals at Admission:  Personal Goals and Risk Factors at Admission - 10/11/23 1402       Core Components/Risk Factors/Patient Goals on Admission    Weight Management Weight Loss    Diabetes Yes    Intervention Provide education about signs/symptoms and action to take for hypo/hyperglycemia.;Provide education about proper nutrition, including hydration, and aerobic/resistive exercise prescription along with prescribed medications to achieve blood  glucose in normal ranges: Fasting glucose 65-99 mg/dL    Expected Outcomes Short Term: Participant verbalizes understanding of the signs/symptoms and immediate care of hyper/hypoglycemia, proper foot care and importance of medication, aerobic/resistive exercise and nutrition plan for blood glucose control.;Long Term: Attainment of HbA1C < 7%.    Hypertension Yes    Intervention Provide education on lifestyle modifcations including regular physical activity/exercise, weight management, moderate sodium restriction and increased consumption of fresh fruit, vegetables, and low fat dairy, alcohol moderation, and smoking cessation.;Monitor prescription use compliance.    Expected Outcomes Short Term: Continued assessment and intervention until BP is < 140/11mm HG in hypertensive participants. < 130/36mm HG in hypertensive participants with diabetes, heart failure or chronic kidney disease.;Long Term: Maintenance of blood pressure at goal levels.    Lipids Yes    Intervention Provide education and support for participant on nutrition & aerobic/resistive exercise along with prescribed medications to achieve LDL 70mg , HDL >40mg .    Expected Outcomes Short Term: Participant states understanding of desired cholesterol values and is compliant with medications prescribed. Participant is following exercise prescription and nutrition guidelines.;Long Term: Cholesterol controlled with medications as prescribed, with individualized exercise RX and with personalized nutrition plan. Value goals: LDL < 70mg , HDL > 40 mg.    Stress Yes    Intervention Offer individual and/or small group education and counseling on adjustment to heart disease, stress management and health-related lifestyle change. Teach and support self-help strategies.;Refer participants experiencing significant psychosocial distress to  appropriate mental health specialists for further evaluation and treatment. When possible, include family members and  significant others in education/counseling sessions.    Expected Outcomes Short Term: Participant demonstrates changes in health-related behavior, relaxation and other stress management skills, ability to obtain effective social support, and compliance with psychotropic medications if prescribed.;Long Term: Emotional wellbeing is indicated by absence of clinically significant psychosocial distress or social isolation.          Core Components/Risk Factors/Patient Goals Review:   Goals and Risk Factor Review     Row Name 11/06/23 1655             Core Components/Risk Factors/Patient Goals Review   Personal Goals Review Weight Management/Obesity;Lipids;Stress;Diabetes;Hypertension       Review Dick started cardiac rehab on 10/15/23. Dick is off to a good start to exercise. Vital signs and CBG's have been stable.       Expected Outcomes Dick will continue to participate in cardiac rehab for exercise, nutrition and lifestyle modifications.          Core Components/Risk Factors/Patient Goals at Discharge (Final Review):   Goals and Risk Factor Review - 11/06/23 1655       Core Components/Risk Factors/Patient Goals Review   Personal Goals Review Weight Management/Obesity;Lipids;Stress;Diabetes;Hypertension    Review Dick started cardiac rehab on 10/15/23. Dick is off to a good start to exercise. Vital signs and CBG's have been stable.    Expected Outcomes Dick will continue to participate in cardiac rehab for exercise, nutrition and lifestyle modifications.          ITP Comments:  ITP Comments     Row Name 10/11/23 1109 11/06/23 1649         ITP Comments Dr. Wilbert Bihari medical director. Introduction to Pritikin education [facility] cardiac rehab. Initial orientation packet reviewed with patient 30 Day ITP Review. Case started cardiac rehab on 10/15/23. Erby is off to a good start to exercise.         Comments: See ITP comments.Hadassah Elpidio Quan RN BSN

## 2023-11-07 ENCOUNTER — Encounter (HOSPITAL_COMMUNITY)
Admission: RE | Admit: 2023-11-07 | Discharge: 2023-11-07 | Disposition: A | Discharge status: Home / Self Care | Source: Ambulatory Visit | Attending: Cardiovascular Disease | Admitting: Cardiovascular Disease

## 2023-11-07 DIAGNOSIS — Z951 Presence of aortocoronary bypass graft: Secondary | ICD-10-CM

## 2023-11-07 DIAGNOSIS — Z48812 Encounter for surgical aftercare following surgery on the circulatory system: Secondary | ICD-10-CM | POA: Diagnosis not present

## 2023-11-09 ENCOUNTER — Encounter (HOSPITAL_COMMUNITY)
Admission: RE | Admit: 2023-11-09 | Discharge: 2023-11-09 | Disposition: A | Discharge status: Home / Self Care | Source: Ambulatory Visit | Attending: Cardiovascular Disease | Admitting: Cardiovascular Disease

## 2023-11-09 DIAGNOSIS — Z48812 Encounter for surgical aftercare following surgery on the circulatory system: Secondary | ICD-10-CM | POA: Diagnosis not present

## 2023-11-09 DIAGNOSIS — Z951 Presence of aortocoronary bypass graft: Secondary | ICD-10-CM

## 2023-11-12 ENCOUNTER — Encounter (HOSPITAL_COMMUNITY)
Admission: RE | Admit: 2023-11-12 | Discharge: 2023-11-12 | Disposition: A | Discharge status: Home / Self Care | Source: Ambulatory Visit | Attending: Cardiovascular Disease | Admitting: Cardiovascular Disease

## 2023-11-12 DIAGNOSIS — Z951 Presence of aortocoronary bypass graft: Secondary | ICD-10-CM

## 2023-11-12 DIAGNOSIS — Z48812 Encounter for surgical aftercare following surgery on the circulatory system: Secondary | ICD-10-CM | POA: Diagnosis not present

## 2023-11-14 ENCOUNTER — Encounter (HOSPITAL_COMMUNITY)
Admission: RE | Admit: 2023-11-14 | Discharge: 2023-11-14 | Disposition: A | Discharge status: Home / Self Care | Source: Ambulatory Visit | Attending: Cardiovascular Disease | Admitting: Cardiovascular Disease

## 2023-11-14 DIAGNOSIS — Z951 Presence of aortocoronary bypass graft: Secondary | ICD-10-CM

## 2023-11-14 DIAGNOSIS — Z48812 Encounter for surgical aftercare following surgery on the circulatory system: Secondary | ICD-10-CM | POA: Diagnosis not present

## 2023-11-16 ENCOUNTER — Encounter (HOSPITAL_COMMUNITY)
Admission: RE | Admit: 2023-11-16 | Discharge: 2023-11-16 | Disposition: A | Discharge status: Home / Self Care | Source: Ambulatory Visit | Attending: Cardiovascular Disease | Admitting: Cardiovascular Disease

## 2023-11-16 DIAGNOSIS — Z48812 Encounter for surgical aftercare following surgery on the circulatory system: Secondary | ICD-10-CM | POA: Diagnosis not present

## 2023-11-16 DIAGNOSIS — Z951 Presence of aortocoronary bypass graft: Secondary | ICD-10-CM

## 2023-11-19 ENCOUNTER — Encounter (HOSPITAL_COMMUNITY)
Admission: RE | Admit: 2023-11-19 | Discharge: 2023-11-19 | Disposition: A | Discharge status: Home / Self Care | Source: Ambulatory Visit | Attending: Cardiovascular Disease | Admitting: Cardiovascular Disease

## 2023-11-19 DIAGNOSIS — Z48812 Encounter for surgical aftercare following surgery on the circulatory system: Secondary | ICD-10-CM | POA: Diagnosis not present

## 2023-11-19 DIAGNOSIS — Z951 Presence of aortocoronary bypass graft: Secondary | ICD-10-CM

## 2023-11-21 ENCOUNTER — Encounter (HOSPITAL_COMMUNITY)
Admission: RE | Admit: 2023-11-21 | Discharge: 2023-11-21 | Disposition: A | Discharge status: Home / Self Care | Source: Ambulatory Visit | Attending: Cardiovascular Disease | Admitting: Cardiovascular Disease

## 2023-11-21 DIAGNOSIS — Z48812 Encounter for surgical aftercare following surgery on the circulatory system: Secondary | ICD-10-CM | POA: Diagnosis not present

## 2023-11-21 DIAGNOSIS — Z951 Presence of aortocoronary bypass graft: Secondary | ICD-10-CM

## 2023-11-23 ENCOUNTER — Encounter (HOSPITAL_COMMUNITY)
Admission: RE | Admit: 2023-11-23 | Discharge: 2023-11-23 | Disposition: A | Discharge status: Home / Self Care | Source: Ambulatory Visit | Attending: Cardiovascular Disease | Admitting: Cardiovascular Disease

## 2023-11-23 DIAGNOSIS — Z951 Presence of aortocoronary bypass graft: Secondary | ICD-10-CM

## 2023-11-23 DIAGNOSIS — Z48812 Encounter for surgical aftercare following surgery on the circulatory system: Secondary | ICD-10-CM | POA: Diagnosis not present

## 2023-11-26 ENCOUNTER — Encounter (HOSPITAL_COMMUNITY)

## 2023-11-28 ENCOUNTER — Encounter (HOSPITAL_COMMUNITY)
Admission: RE | Admit: 2023-11-28 | Discharge: 2023-11-28 | Disposition: A | Discharge status: Home / Self Care | Source: Ambulatory Visit | Attending: Cardiovascular Disease | Admitting: Cardiovascular Disease

## 2023-11-28 DIAGNOSIS — Z951 Presence of aortocoronary bypass graft: Secondary | ICD-10-CM | POA: Diagnosis present

## 2023-11-30 ENCOUNTER — Encounter (HOSPITAL_COMMUNITY)
Admission: RE | Admit: 2023-11-30 | Discharge: 2023-11-30 | Disposition: A | Discharge status: Home / Self Care | Source: Ambulatory Visit | Attending: Cardiovascular Disease | Admitting: Cardiovascular Disease

## 2023-11-30 DIAGNOSIS — Z951 Presence of aortocoronary bypass graft: Secondary | ICD-10-CM | POA: Diagnosis not present

## 2023-12-03 ENCOUNTER — Encounter (HOSPITAL_COMMUNITY)
Admission: RE | Admit: 2023-12-03 | Discharge: 2023-12-03 | Disposition: A | Discharge status: Home / Self Care | Source: Ambulatory Visit | Attending: Cardiovascular Disease | Admitting: Cardiovascular Disease

## 2023-12-03 DIAGNOSIS — Z951 Presence of aortocoronary bypass graft: Secondary | ICD-10-CM

## 2023-12-05 ENCOUNTER — Encounter (HOSPITAL_COMMUNITY)
Admission: RE | Admit: 2023-12-05 | Discharge: 2023-12-05 | Disposition: A | Discharge status: Home / Self Care | Source: Ambulatory Visit | Attending: Cardiovascular Disease | Admitting: Cardiovascular Disease

## 2023-12-05 DIAGNOSIS — Z951 Presence of aortocoronary bypass graft: Secondary | ICD-10-CM

## 2023-12-05 NOTE — Progress Notes (Signed)
 Cardiac Individual Treatment Plan  Patient Details  Name: [name]. 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 (Patient not taking: Reported on 10/11/2023), Disp: 10 tablet, Rfl: 0   amiodarone  (PACERONE ) 200 MG tablet, Take one tablet twice daily until July 01/2024, and then on July 13/2025, start taking one tablet daily, Disp: 42 tablet, Rfl: 0   apixaban  (ELIQUIS ) 5 MG TABS tablet, Take 5 mg by mouth 2 (two) times daily., Disp: , Rfl:    aspirin  EC 81 MG tablet, Take 1 tablet (81 mg total) by mouth daily. Swallow whole., Disp: , Rfl:    Azelastine  HCl 137 MCG/SPRAY SOLN, Place 1 spray into both nostrils 2 (two) times daily. , Disp: , Rfl:    carvedilol  (COREG ) 12.5 MG tablet, Take 1 tablet (12.5 mg total) by mouth 2 (two) times daily., Disp: , Rfl:    Cholecalciferol (VITAMIN D) 50 MCG (2000 UT) CAPS, Take 1 capsule by mouth at bedtime., Disp: , Rfl:    Coenzyme Q10 (COQ10) 100 MG CAPS, Take 100 mg by mouth daily., Disp: , Rfl:    cromolyn  (OPTICROM ) 4 % ophthalmic solution, Place 1 drop into both eyes every 6 (six) hours  as needed (itchy eyes)., Disp: , Rfl:    empagliflozin  (JARDIANCE ) 10 MG TABS tablet, Take 1 tablet (10 mg total) by mouth daily. (Patient not taking: Reported on 10/11/2023), Disp: 30 tablet, Rfl: 0   feeding supplement (ENSURE PLUS HIGH PROTEIN) LIQD, Take 237 mLs by mouth 2 (two) times daily between meals. (Patient not taking: Reported on 10/11/2023), Disp: 14220 mL, Rfl: 0   ferrous gluconate  (FERGON) 324 MG tablet, Take 1 tablet (324 mg total) by mouth daily with breakfast. (Patient not taking: Reported on 10/11/2023), Disp: 30 tablet, Rfl: 0   fluticasone  (FLONASE ) 50 MCG/ACT nasal spray, Place 2 sprays into both nostrils 2 (two) times daily., Disp: , Rfl:    furosemide  (LASIX ) 40 MG tablet, Take 1 tablet (40 mg total) by mouth 2 (two) times daily. (Patient taking differently: Take 20 mg by mouth daily.), Disp: 30 tablet, Rfl: 0   Glucosamine-Chondroit-Vit C-Mn (GLUCOSAMINE 1500 COMPLEX PO), Take 1 tablet by mouth at bedtime., Disp: , Rfl:    hydrOXYzine  (VISTARIL ) 25 MG capsule, Take 25 mg by mouth 2 (two) times daily as needed (sleep)., Disp: , Rfl:    insulin  aspart (NOVOLOG ) 100 UNIT/ML injection, Inject 0-8 Units into the skin 3 (three) times daily before meals. Inject as per sliding scale: 0-150 = 0 units 151-200 = 2 units 201-250 = 3 units 251-300 = 4 units 301-350 = 6  units 351-400 = 8 units 401-999 = 8 units, Call MD, Disp: , Rfl:    insulin  glargine-yfgn (SEMGLEE ) 100 UNIT/ML injection, Inject 0.2 mLs (20 Units total) into the skin daily., Disp: 10 mL, Rfl: 11   loperamide  (IMODIUM ) 2 MG capsule, Take 1 capsule (2 mg total) by mouth every 8 (eight) hours as needed for diarrhea or loose stools. (Patient not taking: Reported on 09/17/2023), Disp: 10 capsule, Rfl: 0   losartan (COZAAR) 25 MG tablet, Take 25 mg by mouth daily. (Patient not taking: Reported on 10/11/2023), Disp: , Rfl:    MAGNESIUM  OXIDE PO, Take 500 mg by mouth at bedtime., Disp: , Rfl:    Multiple Vitamin (MULTIVITAMIN WITH  MINERALS) TABS, Take 1 tablet by mouth every evening. , Disp: , Rfl:    neomycin-polymyxin-hydrocortisone (CORTISPORIN) OTIC solution, Place 4 drops into the right ear 4 (four) times daily as needed (itchy ears)., Disp: , Rfl:    olopatadine  (PATANOL) 0.1 % ophthalmic solution, Place 1 drop into both eyes 2 (two) times daily., Disp: , Rfl:    polyethylene glycol powder (GLYCOLAX /MIRALAX ) 17 GM/SCOOP powder, Take 34 g by mouth daily as needed for mild constipation or moderate constipation., Disp: 255 g, Rfl: 0   potassium chloride  (MICRO-K ) 10 MEQ CR capsule, Take 10 mEq by mouth 2 (two) times daily. (Patient taking differently: Take 10 mEq by mouth daily.), Disp: , Rfl:    predniSONE  (STERAPRED UNI-PAK 21 TAB) 10 MG (21) TBPK tablet, Take one tablet 4 times daily for one day, then one tablet 3 times per day for two days, then one tablet twicedaily for two days, then 1 tablet daily for two days, then half tablet daily for two days then stop. (Patient not taking: Reported on 10/11/2023), Disp: 17 tablet, Rfl: 0   Probiotic Product (PROBIOTIC DAILY PO), Take 2 capsules by mouth daily. (Patient not taking: Reported on 10/11/2023), Disp: , Rfl:    spironolactone  (ALDACTONE ) 25 MG tablet, Take 1 tablet (25 mg total) by mouth daily. (Patient not taking: Reported on 10/11/2023), Disp: 30 tablet, Rfl: 0   terazosin  (HYTRIN ) 2 MG capsule, Take 2 mg by mouth at bedtime., Disp: , Rfl:    Tiotropium Bromide  Monohydrate (SPIRIVA  RESPIMAT) 2.5 MCG/ACT AERS, Inhale 2 puffs into the lungs daily., Disp: , Rfl:    traZODone  (DESYREL ) 100 MG tablet, Take 100 mg by mouth at bedtime., Disp: , Rfl:    vitamin B-12 (CYANOCOBALAMIN) 500 MCG tablet, Take 500 mcg by mouth every evening., Disp: , Rfl:   Past Medical History: Past Medical History:  Diagnosis Date   Allergy    Anxiety    Arthritis    Cataract    bilateral lens implants   Clotting disorder    DVT in 2006 x1; no problems since   Colon stricture - diverticular  (HCC) 09/07/2019   Diabetes mellitus    2   Diverticulitis    Diverticulosis    DVT (deep venous thrombosis) (HCC) 2007   left leg   Hepatitis 1973   treated at [facility]   HLD (hyperlipidemia)    Hypertension    Hypertensive urgency 07/31/2019   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   Neuromuscular disorder (HCC)    neuropathy   Non-toxic multinodular goiter 07/28/2023   Other psoriasis 07/28/2023   Paroxysmal atrial fibrillation (HCC) 09/22/2021   Peripheral vascular disease 2006   dvt 's   Personal history of colon polyps, unspecified 07/28/2023  Phlebitis and thrombophlebitis of lower extremities 07/28/2023   Aug 28, 2008 Entered By: POLLY SNELLEN Comment: h/o this in 2006     Pneumonia 2006   RBBB    S/P rotator cuff repair 05/20/2015   Sleep apnea    Subdural hematoma (HCC)     Tobacco Use: Social History   Tobacco Use  Smoking Status Former   Current packs/day: 0.00   Average packs/day: 1 pack/day for 15.0 years (15.0 ttl pk-yrs)   Types: Cigarettes   Start date: 1964   Quit date: 1979   Years since quitting: 46.8  Smokeless Tobacco Never    Labs: Review Flowsheet  More data exists      Latest Ref Rng & Units 07/24/2023 07/25/2023 07/27/2023 07/30/2023 07/31/2023  Labs for ITP Cardiac and Pulmonary Rehab  Cholestrol 0 - 200 mg/dL - 785  - - -  LDL (calc) 0 - 99 mg/dL - 845  - - -  HDL-C >59 mg/dL - 44  - - -  Trlycerides <150 mg/dL - 79  - - -  Hemoglobin A1c 4.8 - 5.6 % 6.2  - - - -  PH, Arterial 7.35 - 7.45 - - 7.419  7.345  7.342  7.393  7.341  7.330  7.451   PCO2 arterial 32 - 48 mmHg - - 46.3  44.0  51.9  44.5  52.5  46.9  32.0   Bicarbonate 20.0 - 28.0 mmol/L - - 29.9  30.9  24.2  28.1  27.2  28.4  24.8  22.3   TCO2 22 - 32 mmol/L - - 31  32  26  27  30  30   32  29  28  30  30  26  23    Acid-base deficit 0.0 - 2.0 mmol/L - - - 2.0  1.0  1.0   O2 Saturation % - - 98  75  94  100  100  100  97  98      Details       Multiple values from one day are sorted in reverse-chronological order         Capillary Blood Glucose: Lab Results  Component Value Date   GLUCAP 109 (H) 10/15/2023   GLUCAP 139 (H) 10/15/2023   GLUCAP 107 (H) 10/11/2023   GLUCAP 157 (H) 08/27/2023   GLUCAP 249 (H) 08/26/2023     Exercise Target Goals: Exercise Program Goal: Individual exercise prescription set using results from initial 6 min walk test and THRR while considering  patient's activity barriers and safety.   Exercise Prescription Goal: Initial exercise prescription builds to 30-45 minutes a day of aerobic activity, 2-3 days per week.  Home exercise guidelines will be given to patient during program as part of exercise prescription that the participant will acknowledge.  Activity Barriers & Risk Stratification:  Activity Barriers & Cardiac Risk Stratification - 10/11/23 1337       Activity Barriers & Cardiac Risk Stratification   Activity Barriers Balance Concerns;History of Falls;Joint Problems;Arthritis;Back Problems;Deconditioning;Neck/Spine Problems;Shortness of Breath;Other (comment)    Comments lightheadedness    Cardiac Risk Stratification High          6 Minute Walk:  6 Minute Walk     Row Name 10/11/23 1342         6 Minute Walk   Phase Initial     Distance 720 feet     Walk Time 6 minutes     # of Rest Breaks 1  5:08-6:00  MPH 1.4     METS 1.23     RPE 14     Perceived Dyspnea  1     VO2 Peak 4.3     Symptoms No     Resting HR 63 bpm     Resting BP 114/72     Resting Oxygen  Saturation  97 %     Exercise Oxygen  Saturation  during 6 min walk 98 %     Max Ex. HR 73 bpm     Max Ex. BP 122/74     2 Minute Post BP 120/74        Oxygen  Initial Assessment:   Oxygen  Re-Evaluation:   Oxygen  Discharge (Final Oxygen  Re-Evaluation):   Initial Exercise Prescription:  Initial Exercise Prescription - 10/11/23 1300       Date of Initial Exercise RX and Referring  Provider   Date 10/11/23    Referring Provider Ozell Fell, MD    Expected Discharge Date 01/02/24      Recumbant Bike   Level 1    RPM 60    Watts 25    Minutes 15    METs 1.2      NuStep   Level 1    SPM 75    Minutes 15    METs 1.2      Prescription Details   Frequency (times per week) 3    Duration Progress to 30 minutes of continuous aerobic without signs/symptoms of physical distress      Intensity   THRR 40-80% of Max Heartrate 59-118    Ratings of Perceived Exertion 11-13    Perceived Dyspnea 0-4      Progression   Progression Continue progressive overload as per policy without signs/symptoms or physical distress.      Resistance Training   Training Prescription Yes    Weight 3 lbs    Reps 10-15          Perform Capillary Blood Glucose checks as needed.  Exercise Prescription Changes:   Exercise Prescription Changes     Row Name 10/15/23 1637 10/31/23 1630 11/05/23 1635 11/16/23 1659 11/30/23 1640     Response to Exercise   Blood Pressure (Admit) 154/72 130/58 136/56 120/70 120/70   Blood Pressure (Exercise) 154/70 128/74 154/62 -- --   Blood Pressure (Exit) 112/72 140/75 124/68 110/70 122/52   Heart Rate (Admit) 68 bpm 65 bpm 65 bpm 59 bpm 66 bpm   Heart Rate (Exercise) 78 bpm 73 bpm 79 bpm 78 bpm 94 bpm   Heart Rate (Exit) 70 bpm 66 bpm 67 bpm 64 bpm 39 bpm   Rating of Perceived Exertion (Exercise) 12 11 11 12  11.5   Perceived Dyspnea (Exercise) 0 0 0 0 0   Symptoms Dizzy during wts, resolved -- -- -- --   Comments Pt first day in the [facility] I[facility] program Reviewed MET's, goals and home ExRx Reviewed home ExRx Reviewed MET's Reviewed MET's goals   Duration Progress to 30 minutes of  aerobic without signs/symptoms of physical distress Progress to 30 minutes of  aerobic without signs/symptoms of physical distress Progress to 30 minutes of  aerobic without signs/symptoms of physical distress Progress to 30 minutes of  aerobic without signs/symptoms  of physical distress Progress to 30 minutes of  aerobic without signs/symptoms of physical distress   Intensity THRR unchanged THRR unchanged THRR unchanged THRR unchanged THRR unchanged     Progression   Progression Continue to progress workloads to maintain intensity without signs/symptoms of  physical distress. Continue to progress workloads to maintain intensity without signs/symptoms of physical distress. Continue to progress workloads to maintain intensity without signs/symptoms of physical distress. Continue to progress workloads to maintain intensity without signs/symptoms of physical distress. Continue to progress workloads to maintain intensity without signs/symptoms of physical distress.   Average METs 1.55 1.8 1.95 2.05 2.4     Resistance Training   Training Prescription Yes No Yes Yes Yes   Weight 3 lbs 3 lbs 4 lbs 4 lbs 5 lbs   Reps 10-15 10-15 10-15 10-15 10-15   Time 10 Minutes 10 Minutes 10 Minutes 10 Minutes 10 Minutes     Recumbant Bike   Level 1 2 2 3 3    RPM 34 56 55 58 55   Watts 8 13 15 29 27    Minutes 15 15 15 15 15    METs 1.7 1.9 2.2 2.4 2.4     NuStep   Level 1 1 1 2 3    SPM 53 63 70 76 85   Minutes 15 15 15 15 15    METs 1.4 1.7 1.7 1.7 2.4     Home Exercise Plan   Plans to continue exercise at -- -- [employer] (comment) [employer] (comment) [employer] (comment)   Frequency -- -- Add 2 additional days to program exercise sessions. Add 2 additional days to program exercise sessions. Add 2 additional days to program exercise sessions.   Initial Home Exercises Provided -- -- 11/05/23 11/05/23 11/05/23      Exercise Comments:   Exercise Comments     Row Name 10/15/23 1644 10/31/23 1630 11/01/23 1004 11/05/23 1638 11/16/23 1703   Exercise Comments Pt first day in the [facility] program. Pt tolerated exercise well with some mild dizziness post exercise that quickly resolved. Average MET's today 1.55. He is off to a good start and is  learning his THRR, RPE and ExRx Reviewed MET's and goals. Pt tolerated exercise well with an average MET level of 1.8. Pt is doing well and progressing MET's. talked today about increasing SPM on the nustep and he increased his WL on the [facility] Bike. Overall doing well. Will go over home exercise soon. He has a memebership to [employer] and [employer] Reviewed MET's and goals. Pt tolerated exercise well with an average MET level of 1.8. Pt is doing well and progressing MET's. talked today about increasing SPM on the nustep and he increased his WL on the [facility] Bike. Overall doing well. Will go over home exercise soon. He has a memebership to [employer] and [employer] Reviewed home ExRx. Pt tolerated exercise well with an average MET level of 1.95. Pt is doing well and progressing MET's gradually. He is going to exercise on his own by going to [employer], [employer] [facility], walking and weights. Talked about gradual weight lifting progression since he is off his sternal precautions. He plans to exercise 2 days for 30-45 mins (broken up into 10 mis sessions as needed). Reviewed MET's. Pt tolerated exercise well with an average MET level of 2.05. Patient is making good progress and moved up on both stations today. Will continue to monitor patient and progress workloads as tolerated to help increase endurance    Row Name 11/30/23 1646           Exercise Comments Reviewed MET's and goals. Pt tolerated exercise well with an average MET level of 2.4. Pt is doing well and progressing MET's and WL's. He's able to do some  extra side work that he enjoys and feels he has more energy overall. He's been trying some youtube classes and plans to add in some tai chi and maybe some classes at the [facility].          Exercise Goals and Review:   Exercise Goals     Row Name 10/11/23 1110             Exercise Goals   Increase Physical Activity Yes       Intervention Provide advice, education, support and counseling about physical  activity/exercise needs.;Develop an individualized exercise prescription for aerobic and resistive training based on initial evaluation findings, risk stratification, comorbidities and participant's personal goals.       Expected Outcomes Short Term: Attend rehab on a regular basis to increase amount of physical activity.;Long Term: Exercising regularly at least 3-5 days a week.;Long Term: Add in home exercise to make exercise part of routine and to increase amount of physical activity.       Increase Strength and Stamina Yes       Intervention Provide advice, education, support and counseling about physical activity/exercise needs.;Develop an individualized exercise prescription for aerobic and resistive training based on initial evaluation findings, risk stratification, comorbidities and participant's personal goals.       Expected Outcomes Short Term: Increase workloads from initial exercise prescription for resistance, speed, and METs.;Short Term: Perform resistance training exercises routinely during rehab and add in resistance training at home;Long Term: Improve cardiorespiratory fitness, muscular endurance and strength as measured by increased METs and functional capacity ( )       Able to understand and use rate of perceived exertion (RPE) scale Yes       Intervention Provide education and explanation on how to use RPE scale       Expected Outcomes Short Term: Able to use RPE daily in rehab to express subjective intensity level;Long Term:  Able to use RPE to guide intensity level when exercising independently       Knowledge and understanding of Target Heart Rate Range (THRR) Yes       Intervention Provide education and explanation of THRR including how the numbers were predicted and where they are located for reference       Expected Outcomes Short Term: Able to state/look up THRR;Long Term: Able to use THRR to govern intensity when exercising independently;Short Term: Able to use daily as  guideline for intensity in rehab       Understanding of Exercise Prescription Yes       Intervention Provide education, explanation, and written materials on patient's individual exercise prescription       Expected Outcomes Short Term: Able to explain program exercise prescription;Long Term: Able to explain home exercise prescription to exercise independently          Exercise Goals Re-Evaluation :  Exercise Goals Re-Evaluation     Row Name 10/15/23 1642 10/31/23 1630 11/30/23 1642         Exercise Goal Re-Evaluation   Exercise Goals Review Increase Physical Activity;Understanding of Exercise Prescription;Increase Strength and Stamina;Knowledge and understanding of Target Heart Rate Range (THRR);Able to understand and use rate of perceived exertion (RPE) scale Increase Physical Activity;Understanding of Exercise Prescription;Increase Strength and Stamina;Knowledge and understanding of Target Heart Rate Range (THRR);Able to understand and use rate of perceived exertion (RPE) scale Increase Physical Activity;Understanding of Exercise Prescription;Increase Strength and Stamina;Knowledge and understanding of Target Heart Rate Range (THRR);Able to understand and use rate of perceived exertion (RPE) scale  Comments Pt first day in the [facility] program. Pt tolerated exercise well with some mild dizziness post exercise that quickly resolved. Average MET's today 1.55. He is off to a good start and is learning his THRR, RPE and ExRx Reviewed MET's and goals. Pt tolerated exercise well with an average MET level of 1.8. Pt is doing well and progressing MET's. talked today about increasing SPM on the nustep and he increased his WL on the [facility] Bike. Overall doing well. Will go over home exercise soon. He has a memebership to [employer] and [employer] Reviewed MET's and goals. Pt tolerated exercise well with an average MET level of 2.4. Pt is doing well and progressing MET's and WL's. He's able to do some extra  side work that he enjoys and feels he has more energy overall. He's been trying some youtube classes and plans to add in some tai chi and maybe some classes at the [facility].     Expected Outcomes Will continue to monitor pt and progress workloads as tolerated without sign or symptom Will continue to monitor pt and progress workloads as tolerated without sign or symptom Will continue to monitor pt and progress workloads as tolerated without sign or symptom        Discharge Exercise Prescription (Final Exercise Prescription Changes):  Exercise Prescription Changes - 11/30/23 1640       Response to Exercise   Blood Pressure (Admit) 120/70    Blood Pressure (Exit) 122/52    Heart Rate (Admit) 66 bpm    Heart Rate (Exercise) 94 bpm    Heart Rate (Exit) 39 bpm    Rating of Perceived Exertion (Exercise) 11.5    Perceived Dyspnea (Exercise) 0    Comments Reviewed MET's goals    Duration Progress to 30 minutes of  aerobic without signs/symptoms of physical distress    Intensity THRR unchanged      Progression   Progression Continue to progress workloads to maintain intensity without signs/symptoms of physical distress.    Average METs 2.4      Resistance Training   Training Prescription Yes    Weight 5 lbs    Reps 10-15    Time 10 Minutes      Recumbant Bike   Level 3    RPM 55    Watts 27    Minutes 15    METs 2.4      NuStep   Level 3    SPM 85    Minutes 15    METs 2.4      Home Exercise Plan   Plans to continue exercise at [employer] (comment)    Frequency Add 2 additional days to program exercise sessions.    Initial Home Exercises Provided 11/05/23          Nutrition:  Target Goals: Understanding of nutrition guidelines, daily intake of sodium 1500mg , cholesterol 200mg , calories 30% from fat and 7% or less from saturated fats, daily to have 5 or more servings of fruits and vegetables.  Biometrics:  Pre Biometrics - 10/11/23 1100       Pre Biometrics    Waist Circumference 51.25 inches    Hip Circumference 45 inches    Waist to Hip Ratio 1.14 %    Triceps Skinfold 25 mm    % Body Fat 31.7 %    Grip Strength 14 kg    Flexibility --   No performed; active low back pain 4/10   Single Leg Stand 1 seconds  Nutrition Therapy Plan and Nutrition Goals:  Nutrition Therapy & Goals - 10/16/23 1046       Nutrition Therapy   Diet Heart healthy diet      Personal Nutrition Goals   Nutrition Goal Patient to identify strategies for reducing cardiovascular risk by attending the [facility] education and nutrition series weekly.    Personal Goal #2 Patient to improve diet quality by using the plate method as a guide for meal planning to include lean protein/plant protein, fruits, vegetables, whole grains, nonfat dairy as part of a well-balanced diet.    Comments Patient has medical history of CABGx3, DM2, PAF, hyperlipidemia, HTN, former smoker, COPD, NSTEMI. LDL is not at goal; he continues praluent as he is statin intolerant. A1c is well controlled in a prediabetic range. Patient will benefit from participation in intensive cardiac rehab for nutrition education, exercise, and lifestyle modification.      Intervention Plan   Intervention Prescribe, educate and counsel regarding individualized specific dietary modifications aiming towards targeted core components such as weight, hypertension, lipid management, diabetes, heart failure and other comorbidities.;Nutrition handout(s) given to patient.    Expected Outcomes Short Term Goal: Understand basic principles of dietary content, such as calories, fat, sodium, cholesterol and nutrients.;Long Term Goal: Adherence to prescribed nutrition plan.          Nutrition Assessments:  Nutrition Assessments - 10/19/23 1701       MEDFICTS Scores   Pre Score 64         MEDIFICTS Score Key: >=70 Need to make dietary changes  40-70 Heart Healthy Diet <= 40 Therapeutic Level Cholesterol Diet    Flowsheet Row INTENSIVE CARDIAC REHAB from 10/22/2023 in [facility] for Heart, Vascular, & Lung Health  Picture Your Plate Total Score on Admission 64   Picture Your Plate Scores: <59 Unhealthy dietary pattern with much room for improvement. 41-50 Dietary pattern unlikely to meet recommendations for good health and room for improvement. 51-60 More healthful dietary pattern, with some room for improvement.  >60 Healthy dietary pattern, although there may be some specific behaviors that could be improved.    Nutrition Goals Re-Evaluation:  Nutrition Goals Re-Evaluation     Row Name 10/16/23 1046             Goals   Current Weight 246 lb 4.1 oz (111.7 kg)       Comment cholesterol 214, LDL 154, HDL 44       Expected Outcome Patient has medical history of CABGx3, DM2, PAF, hyperlipidemia, HTN, former smoker, COPD, NSTEMI. LDL is not at goal; he continues praluent as he is statin intolerant. A1c is well controlled in a prediabetic range. Patient will benefit from participation in intensive cardiac rehab for nutrition education, exercise, and lifestyle modification.          Nutrition Goals Re-Evaluation:  Nutrition Goals Re-Evaluation     Row Name 10/16/23 1046             Goals   Current Weight 246 lb 4.1 oz (111.7 kg)       Comment cholesterol 214, LDL 154, HDL 44       Expected Outcome Patient has medical history of CABGx3, DM2, PAF, hyperlipidemia, HTN, former smoker, COPD, NSTEMI. LDL is not at goal; he continues praluent as he is statin intolerant. A1c is well controlled in a prediabetic range. Patient will benefit from participation in intensive cardiac rehab for nutrition education, exercise, and lifestyle modification.  Nutrition Goals Discharge (Final Nutrition Goals Re-Evaluation):  Nutrition Goals Re-Evaluation - 10/16/23 1046       Goals   Current Weight 246 lb 4.1 oz (111.7 kg)    Comment cholesterol 214, LDL 154, HDL 44     Expected Outcome Patient has medical history of CABGx3, DM2, PAF, hyperlipidemia, HTN, former smoker, COPD, NSTEMI. LDL is not at goal; he continues praluent as he is statin intolerant. A1c is well controlled in a prediabetic range. Patient will benefit from participation in intensive cardiac rehab for nutrition education, exercise, and lifestyle modification.          Psychosocial: Target Goals: Acknowledge presence or absence of significant depression and/or stress, maximize coping skills, provide positive support system. Participant is able to verbalize types and ability to use techniques and skills needed for reducing stress and depression.  Initial Review & Psychosocial Screening:  Initial Psych Review & Screening - 10/11/23 1308       Initial Review   Current issues with Current Stress Concerns;Current Anxiety/Panic;Current Sleep Concerns;Current Psychotropic Meds   related to health   Source of Stress Concerns Financial;Family      Family Dynamics   Good Support System? Yes    Comments Pt has good support system with his wife and daughters      Barriers   Psychosocial barriers to participate in program The patient should benefit from training in stress management and relaxation.;There are no identifiable barriers or psychosocial needs.      Screening Interventions   Interventions Encouraged to exercise;Provide feedback about the scores to participant    Expected Outcomes Long Term Goal: Stressors or current issues are controlled or eliminated.;Short Term goal: Identification and review with participant of any Quality of Life or Depression concerns found by scoring the questionnaire.;Long Term goal: The participant improves quality of Life and PHQ9 Scores as seen by post scores and/or verbalization of changes          Quality of Life Scores:  Quality of Life - 10/11/23 1249       Quality of Life   Select Quality of Life      Quality of Life Scores   Health/Function Pre  12.93 %    Socioeconomic Pre 25.71 %    Psych/Spiritual Pre 23 %    Family Pre 28.8 %    GLOBAL Pre 19.97 %         Scores of 19 and below usually indicate a poorer quality of life in these areas.  A difference of  2-3 points is a clinically meaningful difference.  A difference of 2-3 points in the total score of the Quality of Life Index has been associated with significant improvement in overall quality of life, self-image, physical symptoms, and general health in studies assessing change in quality of life.  PHQ-9: Review Flowsheet       10/11/2023 10/17/2014  Depression screen PHQ 2/9  Decreased Interest 0 0  Down, Depressed, Hopeless 0 0  PHQ - 2 Score 0 0  Altered sleeping 2 -  Tired, decreased energy 2 -  Change in appetite 1 -  Feeling bad or failure about yourself  1 -  Trouble concentrating 0 -  Moving slowly or fidgety/restless 0 -  Suicidal thoughts 0 -  PHQ-9 Score 6 -  Difficult doing work/chores Not difficult at all -   Interpretation of Total Score  Total Score Depression Severity:  1-4 = Minimal depression, 5-9 = Mild depression, 10-14 = Moderate depression, 15-19 =  Moderately severe depression, 20-27 = Severe depression   Psychosocial Evaluation and Intervention:   Psychosocial Re-Evaluation:  Psychosocial Re-Evaluation     Row Name 11/06/23 1652 12/05/23 1423           Psychosocial Re-Evaluation   Current issues with Current Stress Concerns;Current Psychotropic Meds;Current Anxiety/Panic Current Stress Concerns;Current Psychotropic Meds;Current Anxiety/Panic      Comments Dick has not voiced any increased concerns or stressors during exercise at cardiac rehab. will review PHQ9 and quality of life questionnaire in the near future. PHQ9 and quality of life questionnaire Reviewed. Kurt says that his energy level has improved and he is sleeping better. Rich also says he ahs an improvement in his shortness of breath.      Expected Outcomes Dick will have  controlled or decreased anxiety/ stressors upon compleiton of cardiac rehab. Dick will have controlled or decreased anxiety/ stressors upon compleiton of cardiac rehab.      Interventions Stress management education;Relaxation education;Encouraged to attend Cardiac Rehabilitation for the exercise Stress management education;Relaxation education;Encouraged to attend Cardiac Rehabilitation for the exercise      Continue Psychosocial Services  Follow up required by staff No Follow up required        Initial Review   Source of Stress Concerns Financial;Chronic Illness;Family Financial;Chronic Illness;Family      Comments Will continue to monitor and offer support as needed. Will continue to monitor and offer support as needed.         Psychosocial Discharge (Final Psychosocial Re-Evaluation):  Psychosocial Re-Evaluation - 12/05/23 1423       Psychosocial Re-Evaluation   Current issues with Current Stress Concerns;Current Psychotropic Meds;Current Anxiety/Panic    Comments PHQ9 and quality of life questionnaire Reviewed. Kurt says that his energy level has improved and he is sleeping better. Rich also says he ahs an improvement in his shortness of breath.    Expected Outcomes Dick will have controlled or decreased anxiety/ stressors upon compleiton of cardiac rehab.    Interventions Stress management education;Relaxation education;Encouraged to attend Cardiac Rehabilitation for the exercise    Continue Psychosocial Services  No Follow up required      Initial Review   Source of Stress Concerns Financial;Chronic Illness;Family    Comments Will continue to monitor and offer support as needed.          Vocational Rehabilitation: Provide vocational rehab assistance to qualifying candidates.   Vocational Rehab Evaluation & Intervention:  Vocational Rehab - 10/11/23 1355       Initial Vocational Rehab Evaluation & Intervention   Assessment shows need for Vocational Rehabilitation No   Pt is  retired         Education: Education Goals: Education classes will be provided on a weekly basis, covering required topics. Participant will state understanding/return demonstration of topics presented.    Education     Row Name 10/15/23 1400     Education   Cardiac Education Topics Pritikin   Select Workshops     Workshops   Educator Exercise Physiologist   Select Psychosocial   Psychosocial Workshop Recognizing and Reducing Stress   Instruction Review Code 1- Verbalizes Understanding   Class Start Time 1405   Class Stop Time 1448   Class Time Calculation (min) 43 min    Row Name 10/17/23 1400     Education   Cardiac Education Topics Pritikin   Customer service manager   Weekly Topic [employer] Desserts   Instruction  Review Code 1- Verbalizes Understanding   Class Start Time 1400   Class Stop Time 1450   Class Time Calculation (min) 50 min    Row Name 10/19/23 1500     Education   Cardiac Education Topics Pritikin   Select Core Videos     Core Videos   Educator Dietitian   Select Nutrition   Nutrition Calorie Density   Instruction Review Code 1- Verbalizes Understanding   Class Start Time 1400   Class Stop Time 1435   Class Time Calculation (min) 35 min    Row Name 10/22/23 1400     Education   Cardiac Education Topics Pritikin   Geographical information systems officer Exercise   Exercise Workshop Exercise Basics: Diplomatic Services operational officer   Instruction Review Code 1- Verbalizes Understanding   Class Start Time 1420   Class Stop Time 1500   Class Time Calculation (min) 40 min    Row Name 10/24/23 1100     Education   Cardiac Education Topics Pritikin   Customer service manager   Weekly Topic Efficiency Cooking - Meals in a Snap   Instruction Review Code 1- Verbalizes Understanding   Class Start Time 1145   Class Stop Time 1230    Class Time Calculation (min) 45 min    Row Name 10/26/23 1400     Education   Cardiac Education Topics Pritikin   Psychologist, forensic Exercise Education   Exercise Education Move It!   Instruction Review Code 1- Verbalizes Understanding   Class Start Time 1359   Class Stop Time 1432   Class Time Calculation (min) 33 min    Row Name 10/31/23 1400     Education   Cardiac Education Topics Pritikin   Customer service manager   Weekly Topic One-Pot Wonders   Instruction Review Code 1- Verbalizes Understanding   Class Start Time 1400   Class Stop Time 1440   Class Time Calculation (min) 40 min    Row Name 11/02/23 1500     Education   Cardiac Education Topics Pritikin   Writer General Education   General Education Hypertension and Heart Disease   Instruction Review Code 1- Verbalizes Understanding   Class Start Time 1403   Class Stop Time 1440   Class Time Calculation (min) 37 min    Row Name 11/05/23 1500     Education   Cardiac Education Topics Pritikin   Geographical information systems officer Psychosocial   Psychosocial Workshop Focused Goals, Sustainable Changes   Instruction Review Code 1- Verbalizes Understanding   Class Start Time 1359   Class Stop Time 1434   Class Time Calculation (min) 35 min    Row Name 11/07/23 1400     Education   Cardiac Education Topics Pritikin   Customer service manager   Weekly Topic Comforting Weekend Breakfasts   Instruction Review Code 1- Verbalizes Understanding   Class Start Time 1358   Class Stop Time 1439   Class Time Calculation (min) 41 min    Row  Name 11/09/23 1300     Education   Cardiac Education Topics Pritikin   Nurse, children's Exercise Physiologist    Select Nutrition   Nutrition Dining Out - Part 1   Instruction Review Code 1- Verbalizes Understanding   Class Start Time 1400   Class Stop Time 1440   Class Time Calculation (min) 40 min    Row Name 11/12/23 1300     Education   Cardiac Education Topics Pritikin   Psychologist, forensic Exercise Education   Exercise Education Biomechanial Limitations   Instruction Review Code 1- Verbalizes Understanding   Class Start Time 1405   Class Stop Time 1440   Class Time Calculation (min) 35 min    Row Name 11/14/23 1300     Education   Cardiac Education Topics Pritikin   Secondary school teacher School   Educator Nurse;Respiratory Therapist   Weekly Topic Fast Evening Meals   Instruction Review Code 1- Verbalizes Understanding   Class Start Time 1356   Class Stop Time 1428   Class Time Calculation (min) 32 min    Row Name 11/16/23 1400     Education   Cardiac Education Topics Pritikin   Licensed conveyancer Nutrition   Nutrition Vitamins and Minerals   Instruction Review Code 1- Verbalizes Understanding   Class Start Time 1357   Class Stop Time 1440   Class Time Calculation (min) 43 min    Row Name 11/19/23 1300     Education   Cardiac Education Topics Pritikin   Psychologist, forensic Exercise Education   Exercise Education Improving Performance   Instruction Review Code 1- Verbalizes Understanding   Class Start Time 1400   Class Stop Time 1438   Class Time Calculation (min) 38 min    Row Name 11/23/23 1400     Education   Cardiac Education Topics Pritikin   Glass blower/designer Nutrition   Nutrition Workshop Fueling a Forensic psychologist   Instruction Review Code 1- Tax inspector   Class Start Time 1357   Class Stop Time 1433   Class Time  Calculation (min) 36 min    Row Name 11/28/23 1400     Education   Cardiac Education Topics Pritikin   Set designer Dietitian;Respiratory Therapist   Weekly Topic Simple Sides and Sauces   Instruction Review Code 1- Verbalizes Understanding   Class Start Time 1400   Class Stop Time 1435   Class Time Calculation (min) 35 min    Row Name 11/30/23 1300     Education   Cardiac Education Topics Pritikin   Nurse, children's Exercise Physiologist   Select Psychosocial   Psychosocial How Our Thoughts Can Heal Our Hearts   Instruction Review Code 1- Verbalizes Understanding   Class Start Time 1400   Class Stop Time 1436   Class Time Calculation (min) 36 min    Row Name 12/03/23 1300     Education   Cardiac Education Topics Pritikin   Psychologist, sport and exercise  Core Special educational needs teacher Education   General Education Heart Disease Risk Reduction   Instruction Review Code 1- Verbalizes Understanding   Class Start Time 1410   Class Stop Time 1450   Class Time Calculation (min) 40 min    Row Name 12/05/23 1300     Education   Cardiac Education Topics Pritikin   Customer service manager   Weekly Topic Powerhouse Plant-Based Proteins   Instruction Review Code 1- Verbalizes Understanding      Core Videos: Exercise    Move It!  Clinical staff conducted group or individual video education with verbal and written material and guidebook.  Patient learns the recommended Pritikin exercise program. Exercise with the goal of living a long, healthy life. Some of the health benefits of exercise include controlled diabetes, healthier blood pressure levels, improved cholesterol levels, improved heart and lung capacity, improved sleep, and better body composition. Everyone should speak with their doctor before starting or changing an exercise  routine.  Biomechanical Limitations Clinical staff conducted group or individual video education with verbal and written material and guidebook.  Patient learns how biomechanical limitations can impact exercise and how we can mitigate and possibly overcome limitations to have an impactful and balanced exercise routine.  Body Composition Clinical staff conducted group or individual video education with verbal and written material and guidebook.  Patient learns that body composition (ratio of muscle mass to fat mass) is a key component to assessing overall fitness, rather than body weight alone. Increased fat mass, especially visceral belly fat, can put us  at increased risk for metabolic syndrome, type 2 diabetes, heart disease, and even death. It is recommended to combine diet and exercise (cardiovascular and resistance training) to improve your body composition. Seek guidance from your physician and exercise physiologist before implementing an exercise routine.  Exercise Action Plan Clinical staff conducted group or individual video education with verbal and written material and guidebook.  Patient learns the recommended strategies to achieve and enjoy long-term exercise adherence, including variety, self-motivation, self-efficacy, and positive decision making. Benefits of exercise include fitness, good health, weight management, more energy, better sleep, less stress, and overall well-being.  Medical   Heart Disease Risk Reduction Clinical staff conducted group or individual video education with verbal and written material and guidebook.  Patient learns our heart is our most vital organ as it circulates oxygen , nutrients, white blood cells, and hormones throughout the entire body, and carries waste away. Data supports a plant-based eating plan like the Pritikin Program for its effectiveness in slowing progression of and reversing heart disease. The video provides a number of recommendations to  address heart disease.   Metabolic Syndrome and Belly Fat  Clinical staff conducted group or individual video education with verbal and written material and guidebook.  Patient learns what metabolic syndrome is, how it leads to heart disease, and how one can reverse it and keep it from coming back. You have metabolic syndrome if you have 3 of the following 5 criteria: abdominal obesity, high blood pressure, high triglycerides, low HDL cholesterol, and high blood sugar.  Hypertension and Heart Disease Clinical staff conducted group or individual video education with verbal and written material and guidebook.  Patient learns that high blood pressure, or hypertension, is very common in the [place] . Hypertension is largely due to excessive salt intake, but other important risk factors include being overweight, physical inactivity, drinking too  much alcohol, smoking, and not eating enough potassium from fruits and vegetables. High blood pressure is a leading risk factor for heart attack, stroke, congestive heart failure, dementia, kidney failure, and premature death. Long-term effects of excessive salt intake include stiffening of the arteries and thickening of heart muscle and organ damage. Recommendations include ways to reduce hypertension and the risk of heart disease.  Diseases of Our Time - Focusing on Diabetes Clinical staff conducted group or individual video education with verbal and written material and guidebook.  Patient learns why the best way to stop diseases of our time is prevention, through food and other lifestyle changes. Medicine (such as prescription pills and surgeries) is often only a Band-Aid on the problem, not a long-term solution. Most common diseases of our time include obesity, type 2 diabetes, hypertension, heart disease, and cancer. The Pritikin Program is recommended and has been proven to help reduce, reverse, and/or prevent the damaging effects of metabolic  syndrome.  Nutrition   Overview of the Pritikin Eating Plan  Clinical staff conducted group or individual video education with verbal and written material and guidebook.  Patient learns about the Pritikin Eating Plan for disease risk reduction. The Pritikin Eating Plan emphasizes a wide variety of unrefined, minimally-processed carbohydrates, like fruits, vegetables, whole grains, and legumes. Go, Caution, and Stop food choices are explained. Plant-based and lean animal proteins are emphasized. Rationale provided for low sodium intake for blood pressure control, low added sugars for blood sugar stabilization, and low added fats and oils for coronary artery disease risk reduction and weight management.  Calorie Density  Clinical staff conducted group or individual video education with verbal and written material and guidebook.  Patient learns about calorie density and how it impacts the Pritikin Eating Plan. Knowing the characteristics of the food you choose will help you decide whether those foods will lead to weight gain or weight loss, and whether you want to consume more or less of them. Weight loss is usually a side effect of the Pritikin Eating Plan because of its focus on low calorie-dense foods.  Label Reading  Clinical staff conducted group or individual video education with verbal and written material and guidebook.  Patient learns about the Pritikin recommended label reading guidelines and corresponding recommendations regarding calorie density, added sugars, sodium content, and whole grains.  Dining Out - Part 1  Clinical staff conducted group or individual video education with verbal and written material and guidebook.  Patient learns that restaurant meals can be sabotaging because they can be so high in calories, fat, sodium, and/or sugar. Patient learns recommended strategies on how to positively address this and avoid unhealthy pitfalls.  Facts on Fats  Clinical staff conducted  group or individual video education with verbal and written material and guidebook.  Patient learns that lifestyle modifications can be just as effective, if not more so, as many medications for lowering your risk of heart disease. A Pritikin lifestyle can help to reduce your risk of inflammation and atherosclerosis (cholesterol build-up, or plaque, in the artery walls). Lifestyle interventions such as dietary choices and physical activity address the cause of atherosclerosis. A review of the types of fats and their impact on blood cholesterol levels, along with dietary recommendations to reduce fat intake is also included.  Nutrition Action Plan  Clinical staff conducted group or individual video education with verbal and written material and guidebook.  Patient learns how to incorporate Pritikin recommendations into their lifestyle. Recommendations include planning and keeping personal health  goals in mind as an important part of their success.  Healthy Mind-Set    Healthy Minds, Bodies, Hearts  Clinical staff conducted group or individual video education with verbal and written material and guidebook.  Patient learns how to identify when they are stressed. Video will discuss the impact of that stress, as well as the many benefits of stress management. Patient will also be introduced to stress management techniques. The way we think, act, and feel has an impact on our hearts.  How Our Thoughts Can Heal Our Hearts  Clinical staff conducted group or individual video education with verbal and written material and guidebook.  Patient learns that negative thoughts can cause depression and anxiety. This can result in negative lifestyle behavior and serious health problems. Cognitive behavioral therapy is an effective method to help control our thoughts in order to change and improve our emotional outlook.  Additional Videos:  Exercise    Improving Performance  Clinical staff conducted group or  individual video education with verbal and written material and guidebook.  Patient learns to use a non-linear approach by alternating intensity levels and lengths of time spent exercising to help burn more calories and lose more body fat. Cardiovascular exercise helps improve heart health, metabolism, hormonal balance, blood sugar control, and recovery from fatigue. Resistance training improves strength, endurance, balance, coordination, reaction time, metabolism, and muscle mass. Flexibility exercise improves circulation, posture, and balance. Seek guidance from your physician and exercise physiologist before implementing an exercise routine and learn your capabilities and proper form for all exercise.  Introduction to Yoga  Clinical staff conducted group or individual video education with verbal and written material and guidebook.  Patient learns about yoga, a discipline of the coming together of mind, breath, and body. The benefits of yoga include improved flexibility, improved range of motion, better posture and core strength, increased lung function, weight loss, and positive self-image. Yoga's heart health benefits include lowered blood pressure, healthier heart rate, decreased cholesterol and triglyceride levels, improved immune function, and reduced stress. Seek guidance from your physician and exercise physiologist before implementing an exercise routine and learn your capabilities and proper form for all exercise.  Medical   Aging: Enhancing Your Quality of Life  Clinical staff conducted group or individual video education with verbal and written material and guidebook.  Patient learns key strategies and recommendations to stay in good physical health and enhance quality of life, such as prevention strategies, having an advocate, securing a Health Care Proxy and Power of Attorney, and keeping a list of medications and system for tracking them. It also discusses how to avoid risk for bone  loss.  Biology of Weight Control  Clinical staff conducted group or individual video education with verbal and written material and guidebook.  Patient learns that weight gain occurs because we consume more calories than we burn (eating more, moving less). Even if your body weight is normal, you may have higher ratios of fat compared to muscle mass. Too much body fat puts you at increased risk for cardiovascular disease, heart attack, stroke, type 2 diabetes, and obesity-related cancers. In addition to exercise, following the Pritikin Eating Plan can help reduce your risk.  Decoding Lab Results  Clinical staff conducted group or individual video education with verbal and written material and guidebook.  Patient learns that lab test reflects one measurement whose values change over time and are influenced by many factors, including medication, stress, sleep, exercise, food, hydration, pre-existing medical conditions, and more. It is  recommended to use the knowledge from this video to become more involved with your lab results and evaluate your numbers to speak with your doctor.   Diseases of Our Time - Overview  Clinical staff conducted group or individual video education with verbal and written material and guidebook.  Patient learns that according to the CDC, 50% to 70% of chronic diseases (such as obesity, type 2 diabetes, elevated lipids, hypertension, and heart disease) are avoidable through lifestyle improvements including healthier food choices, listening to satiety cues, and increased physical activity.  Sleep Disorders Clinical staff conducted group or individual video education with verbal and written material and guidebook.  Patient learns how good quality and duration of sleep are important to overall health and well-being. Patient also learns about sleep disorders and how they impact health along with recommendations to address them, including discussing with a physician.  Nutrition   Dining Out - Part 2 Clinical staff conducted group or individual video education with verbal and written material and guidebook.  Patient learns how to plan ahead and communicate in order to maximize their dining experience in a healthy and nutritious manner. Included are recommended food choices based on the type of restaurant the patient is visiting.   Fueling a Banker conducted group or individual video education with verbal and written material and guidebook.  There is a strong connection between our food choices and our health. Diseases like obesity and type 2 diabetes are very prevalent and are in large-part due to lifestyle choices. The Pritikin Eating Plan provides plenty of food and hunger-curbing satisfaction. It is easy to follow, affordable, and helps reduce health risks.  Menu Workshop  Clinical staff conducted group or individual video education with verbal and written material and guidebook.  Patient learns that restaurant meals can sabotage health goals because they are often packed with calories, fat, sodium, and sugar. Recommendations include strategies to plan ahead and to communicate with the manager, chef, or server to help order a healthier meal.  Planning Your Eating Strategy  Clinical staff conducted group or individual video education with verbal and written material and guidebook.  Patient learns about the Pritikin Eating Plan and its benefit of reducing the risk of disease. The Pritikin Eating Plan does not focus on calories. Instead, it emphasizes high-quality, nutrient-rich foods. By knowing the characteristics of the foods, we choose, we can determine their calorie density and make informed decisions.  Targeting Your Nutrition Priorities  Clinical staff conducted group or individual video education with verbal and written material and guidebook.  Patient learns that lifestyle habits have a tremendous impact on disease risk and progression. This  video provides eating and physical activity recommendations based on your personal health goals, such as reducing LDL cholesterol, losing weight, preventing or controlling type 2 diabetes, and reducing high blood pressure.  Vitamins and Minerals  Clinical staff conducted group or individual video education with verbal and written material and guidebook.  Patient learns different ways to obtain key vitamins and minerals, including through a recommended healthy diet. It is important to discuss all supplements you take with your doctor.   Healthy Mind-Set    Smoking Cessation  Clinical staff conducted group or individual video education with verbal and written material and guidebook.  Patient learns that cigarette smoking and tobacco addiction pose a serious health risk which affects millions of people. Stopping smoking will significantly reduce the risk of heart disease, lung disease, and many forms of cancer. Recommended strategies for  quitting are covered, including working with your doctor to develop a successful plan.  Culinary   Becoming a Set designer conducted group or individual video education with verbal and written material and guidebook.  Patient learns that cooking at home can be healthy, cost-effective, quick, and puts them in control. Keys to cooking healthy recipes will include looking at your recipe, assessing your equipment needs, planning ahead, making it simple, choosing cost-effective seasonal ingredients, and limiting the use of added fats, salts, and sugars.  Cooking - Breakfast and Snacks  Clinical staff conducted group or individual video education with verbal and written material and guidebook.  Patient learns how important breakfast is to satiety and nutrition through the entire day. Recommendations include key foods to eat during breakfast to help stabilize blood sugar levels and to prevent overeating at meals later in the day. Planning ahead is also a  key component.  Cooking - Educational psychologist conducted group or individual video education with verbal and written material and guidebook.  Patient learns eating strategies to improve overall health, including an approach to cook more at home. Recommendations include thinking of animal protein as a side on your plate rather than center stage and focusing instead on lower calorie dense options like vegetables, fruits, whole grains, and plant-based proteins, such as beans. Making sauces in large quantities to freeze for later and leaving the skin on your vegetables are also recommended to maximize your experience.  Cooking - Healthy Salads and Dressing Clinical staff conducted group or individual video education with verbal and written material and guidebook.  Patient learns that vegetables, fruits, whole grains, and legumes are the foundations of the Pritikin Eating Plan. Recommendations include how to incorporate each of these in flavorful and healthy salads, and how to create homemade salad dressings. Proper handling of ingredients is also covered. Cooking - Soups and [employer] - Soups and Desserts Clinical staff conducted group or individual video education with verbal and written material and guidebook.  Patient learns that Pritikin soups and desserts make for easy, nutritious, and delicious snacks and meal components that are low in sodium, fat, sugar, and calorie density, while high in vitamins, minerals, and filling fiber. Recommendations include simple and healthy ideas for soups and desserts.   Overview     The Pritikin Solution Program Overview Clinical staff conducted group or individual video education with verbal and written material and guidebook.  Patient learns that the results of the Pritikin Program have been documented in more than 100 articles published in peer-reviewed journals, and the benefits include reducing risk factors for (and, in some cases, even  reversing) high cholesterol, high blood pressure, type 2 diabetes, obesity, and more! An overview of the three key pillars of the Pritikin Program will be covered: eating well, doing regular exercise, and having a healthy mind-set.  WORKSHOPS  Exercise: Exercise Basics: Building Your Action Plan Clinical staff led group instruction and group discussion with PowerPoint presentation and patient guidebook. To enhance the learning environment the use of posters, models and videos may be added. At the conclusion of this workshop, patients will comprehend the difference between physical activity and exercise, as well as the benefits of incorporating both, into their routine. Patients will understand the FITT (Frequency, Intensity, Time, and Type) principle and how to use it to build an exercise action plan. In addition, safety concerns and other considerations for exercise and cardiac rehab will be addressed by the presenter. The purpose  of this lesson is to promote a comprehensive and effective weekly exercise routine in order to improve patients' overall level of fitness.   Managing Heart Disease: Your Path to a Healthier Heart Clinical staff led group instruction and group discussion with PowerPoint presentation and patient guidebook. To enhance the learning environment the use of posters, models and videos may be added.At the conclusion of this workshop, patients will understand the anatomy and physiology of the heart. Additionally, they will understand how Pritikin's three pillars impact the risk factors, the progression, and the management of heart disease.  The purpose of this lesson is to provide a high-level overview of the heart, heart disease, and how the Pritikin lifestyle positively impacts risk factors.  Exercise Biomechanics Clinical staff led group instruction and group discussion with PowerPoint presentation and patient guidebook. To enhance the learning environment the use of  posters, models and videos may be added. Patients will learn how the structural parts of their bodies function and how these functions impact their daily activities, movement, and exercise. Patients will learn how to promote a neutral spine, learn how to manage pain, and identify ways to improve their physical movement in order to promote healthy living. The purpose of this lesson is to expose patients to common physical limitations that impact physical activity. Participants will learn practical ways to adapt and manage aches and pains, and to minimize their effect on regular exercise. Patients will learn how to maintain good posture while sitting, walking, and lifting.  Balance Training and Fall Prevention  Clinical staff led group instruction and group discussion with PowerPoint presentation and patient guidebook. To enhance the learning environment the use of posters, models and videos may be added. At the conclusion of this workshop, patients will understand the importance of their sensorimotor skills (vision, proprioception, and the vestibular system) in maintaining their ability to balance as they age. Patients will apply a variety of balancing exercises that are appropriate for their current level of function. Patients will understand the common causes for poor balance, possible solutions to these problems, and ways to modify their physical environment in order to minimize their fall risk. The purpose of this lesson is to teach patients about the importance of maintaining balance as they age and ways to minimize their risk of falling.  WORKSHOPS   Nutrition:  Fueling a Ship broker led group instruction and group discussion with PowerPoint presentation and patient guidebook. To enhance the learning environment the use of posters, models and videos may be added. Patients will review the foundational principles of the Pritikin Eating Plan and understand what constitutes a  serving size in each of the food groups. Patients will also learn Pritikin-friendly foods that are better choices when away from home and review make-ahead meal and snack options. Calorie density will be reviewed and applied to three nutrition priorities: weight maintenance, weight loss, and weight gain. The purpose of this lesson is to reinforce (in a group setting) the key concepts around what patients are recommended to eat and how to apply these guidelines when away from home by planning and selecting Pritikin-friendly options. Patients will understand how calorie density may be adjusted for different weight management goals.  Mindful Eating  Clinical staff led group instruction and group discussion with PowerPoint presentation and patient guidebook. To enhance the learning environment the use of posters, models and videos may be added. Patients will briefly review the concepts of the Pritikin Eating Plan and the importance of low-calorie dense foods. The  concept of mindful eating will be introduced as well as the importance of paying attention to internal hunger signals. Triggers for non-hunger eating and techniques for dealing with triggers will be explored. The purpose of this lesson is to provide patients with the opportunity to review the basic principles of the Pritikin Eating Plan, discuss the value of eating mindfully and how to measure internal cues of hunger and fullness using the Hunger Scale. Patients will also discuss reasons for non-hunger eating and learn strategies to use for controlling emotional eating.  Targeting Your Nutrition Priorities Clinical staff led group instruction and group discussion with PowerPoint presentation and patient guidebook. To enhance the learning environment the use of posters, models and videos may be added. Patients will learn how to determine their genetic susceptibility to disease by reviewing their family history. Patients will gain insight into the  importance of diet as part of an overall healthy lifestyle in mitigating the impact of genetics and other environmental insults. The purpose of this lesson is to provide patients with the opportunity to assess their personal nutrition priorities by looking at their family history, their own health history and current risk factors. Patients will also be able to discuss ways of prioritizing and modifying the Pritikin Eating Plan for their highest risk areas  Menu  Clinical staff led group instruction and group discussion with PowerPoint presentation and patient guidebook. To enhance the learning environment the use of posters, models and videos may be added. Using menus brought in from [employer], or printed from [employer], patients will apply the Pritikin dining out guidelines that were presented in the [employer] video. Patients will also be able to practice these guidelines in a variety of provided scenarios. The purpose of this lesson is to provide patients with the opportunity to practice hands-on learning of the Pritikin Dining Out guidelines with actual menus and practice scenarios.  Label Reading Clinical staff led group instruction and group discussion with PowerPoint presentation and patient guidebook. To enhance the learning environment the use of posters, models and videos may be added. Patients will review and discuss the Pritikin label reading guidelines presented in Pritikin's Label Reading Educational series video. Using fool labels brought in from local grocery stores and markets, patients will apply the label reading guidelines and determine if the packaged food meet the Pritikin guidelines. The purpose of this lesson is to provide patients with the opportunity to review, discuss, and practice hands-on learning of the Pritikin Label Reading guidelines with actual packaged food labels. Cooking School  Pritikin's [employer] are designed to teach  patients ways to prepare quick, simple, and affordable recipes at home. The importance of nutrition's role in chronic disease risk reduction is reflected in its emphasis in the overall Pritikin program. By learning how to prepare essential core Pritikin Eating Plan recipes, patients will increase control over what they eat; be able to customize the flavor of foods without the use of added salt, sugar, or fat; and improve the quality of the food they consume. By learning a set of core recipes which are easily assembled, quickly prepared, and affordable, patients are more likely to prepare more healthy foods at home. These workshops focus on convenient breakfasts, simple entres, side dishes, and desserts which can be prepared with minimal effort and are consistent with nutrition recommendations for cardiovascular risk reduction. Cooking [employer] are taught by a Armed forces logistics/support/administrative officer (RD) who has been trained by the [employer]. The  chef or RD has a clear understanding of the importance of minimizing - if not completely eliminating - added fat, sugar, and sodium in recipes. Throughout the series of Cooking School Workshop sessions, patients will learn about healthy ingredients and efficient methods of cooking to build confidence in their capability to prepare    Cooking School weekly topics:  Adding Flavor- Sodium-Free  Fast and Healthy Breakfasts  Powerhouse Plant-Based Proteins  Satisfying Salads and Dressings  Simple Sides and Sauces  International Cuisine-Spotlight on the [employer] Zones  Delicious Desserts  Savory Soups  [employer] - Meals in a Astronomer Appetizers and Snacks  Comforting Weekend Breakfasts  One-Pot Wonders   Fast Evening Meals  Landscape architect Your Pritikin Plate  WORKSHOPS   Healthy Mindset (Psychosocial):  Focused Goals, Sustainable Changes Clinical staff led group instruction and group discussion with PowerPoint  presentation and patient guidebook. To enhance the learning environment the use of posters, models and videos may be added. Patients will be able to apply effective goal setting strategies to establish at least one personal goal, and then take consistent, meaningful action toward that goal. They will learn to identify common barriers to achieving personal goals and develop strategies to overcome them. Patients will also gain an understanding of how our mind-set can impact our ability to achieve goals and the importance of cultivating a positive and growth-oriented mind-set. The purpose of this lesson is to provide patients with a deeper understanding of how to set and achieve personal goals, as well as the tools and strategies needed to overcome common obstacles which may arise along the way.  From Head to Heart: The Power of a Healthy Outlook  Clinical staff led group instruction and group discussion with PowerPoint presentation and patient guidebook. To enhance the learning environment the use of posters, models and videos may be added. Patients will be able to recognize and describe the impact of emotions and mood on physical health. They will discover the importance of self-care and explore self-care practices which may work for them. Patients will also learn how to utilize the 4 C's to cultivate a healthier outlook and better manage stress and challenges. The purpose of this lesson is to demonstrate to patients how a healthy outlook is an essential part of maintaining good health, especially as they continue their cardiac rehab journey.  Healthy Sleep for a Healthy Heart Clinical staff led group instruction and group discussion with PowerPoint presentation and patient guidebook. To enhance the learning environment the use of posters, models and videos may be added. At the conclusion of this workshop, patients will be able to demonstrate knowledge of the importance of sleep to overall health, well-being,  and quality of life. They will understand the symptoms of, and treatments for, common sleep disorders. Patients will also be able to identify daytime and nighttime behaviors which impact sleep, and they will be able to apply these tools to help manage sleep-related challenges. The purpose of this lesson is to provide patients with a general overview of sleep and outline the importance of quality sleep. Patients will learn about a few of the most common sleep disorders. Patients will also be introduced to the concept of "sleep hygiene," and discover ways to self-manage certain sleeping problems through simple daily behavior changes. Finally, the workshop will motivate patients by clarifying the links between quality sleep and their goals of heart-healthy living.   Recognizing and Reducing Stress Clinical staff led group instruction and group discussion with PowerPoint  presentation and patient guidebook. To enhance the learning environment the use of posters, models and videos may be added. At the conclusion of this workshop, patients will be able to understand the types of stress reactions, differentiate between acute and chronic stress, and recognize the impact that chronic stress has on their health. They will also be able to apply different coping mechanisms, such as reframing negative self-talk. Patients will have the opportunity to practice a variety of stress management techniques, such as deep abdominal breathing, progressive muscle relaxation, and/or guided imagery.  The purpose of this lesson is to educate patients on the role of stress in their lives and to provide healthy techniques for coping with it.  Learning Barriers/Preferences:  Learning Barriers/Preferences - 10/11/23 1354       Learning Barriers/Preferences   Learning Barriers Sight;Hearing    Learning Preferences Video;Pictoral;Computer/Internet          Education Topics:  Knowledge Questionnaire Score:  Knowledge Questionnaire  Score - 10/11/23 1250       Knowledge Questionnaire Score   Pre Score 21/24          Core Components/Risk Factors/Patient Goals at Admission:  Personal Goals and Risk Factors at Admission - 10/11/23 1402       Core Components/Risk Factors/Patient Goals on Admission    Weight Management Weight Loss    Diabetes Yes    Intervention Provide education about signs/symptoms and action to take for hypo/hyperglycemia.;Provide education about proper nutrition, including hydration, and aerobic/resistive exercise prescription along with prescribed medications to achieve blood glucose in normal ranges: Fasting glucose 65-99 mg/dL    Expected Outcomes Short Term: Participant verbalizes understanding of the signs/symptoms and immediate care of hyper/hypoglycemia, proper foot care and importance of medication, aerobic/resistive exercise and nutrition plan for blood glucose control.;Long Term: Attainment of HbA1C < 7%.    Hypertension Yes    Intervention Provide education on lifestyle modifcations including regular physical activity/exercise, weight management, moderate sodium restriction and increased consumption of fresh fruit, vegetables, and low fat dairy, alcohol moderation, and smoking cessation.;Monitor prescription use compliance.    Expected Outcomes Short Term: Continued assessment and intervention until BP is < 140/41mm HG in hypertensive participants. < 130/37mm HG in hypertensive participants with diabetes, heart failure or chronic kidney disease.;Long Term: Maintenance of blood pressure at goal levels.    Lipids Yes    Intervention Provide education and support for participant on nutrition & aerobic/resistive exercise along with prescribed medications to achieve LDL 70mg , HDL >40mg .    Expected Outcomes Short Term: Participant states understanding of desired cholesterol values and is compliant with medications prescribed. Participant is following exercise prescription and nutrition  guidelines.;Long Term: Cholesterol controlled with medications as prescribed, with individualized exercise RX and with personalized nutrition plan. Value goals: LDL < 70mg , HDL > 40 mg.    Stress Yes    Intervention Offer individual and/or small group education and counseling on adjustment to heart disease, stress management and health-related lifestyle change. Teach and support self-help strategies.;Refer participants experiencing significant psychosocial distress to appropriate mental health specialists for further evaluation and treatment. When possible, include family members and significant others in education/counseling sessions.    Expected Outcomes Short Term: Participant demonstrates changes in health-related behavior, relaxation and other stress management skills, ability to obtain effective social support, and compliance with psychotropic medications if prescribed.;Long Term: Emotional wellbeing is indicated by absence of clinically significant psychosocial distress or social isolation.          Core Components/Risk Factors/Patient Goals  Review:   Goals and Risk Factor Review     Row Name 11/06/23 1655 12/05/23 1427           Core Components/Risk Factors/Patient Goals Review   Personal Goals Review Weight Management/Obesity;Lipids;Stress;Diabetes;Hypertension Weight Management/Obesity;Lipids;Stress;Diabetes;Hypertension      Review Dick started cardiac rehab on 10/15/23. Dick is off to a good start to exercise. Vital signs and CBG's have been stable. Kurt is doing well exercise. Vital signs and CBG's have been stable. Rich has lost 1 kg since starting cardiac rehab      Expected Outcomes Dick will continue to participate in cardiac rehab for exercise, nutrition and lifestyle modifications. Dick will continue to participate in cardiac rehab for exercise, nutrition and lifestyle modifications.         Core Components/Risk Factors/Patient Goals at Discharge (Final Review):   Goals  and Risk Factor Review - 12/05/23 1427       Core Components/Risk Factors/Patient Goals Review   Personal Goals Review Weight Management/Obesity;Lipids;Stress;Diabetes;Hypertension    Review Kurt is doing well exercise. Vital signs and CBG's have been stable. Rich has lost 1 kg since starting cardiac rehab    Expected Outcomes Dick will continue to participate in cardiac rehab for exercise, nutrition and lifestyle modifications.          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 Introduction to [facility] cardiac rehab. Initial orientation packet reviewed with patient 30 Day ITP Review. Sarah started cardiac rehab on 10/15/23. Square is off to a good start to exercise. 30 Day ITP Review. Jeramy has good attendance and participation with exercise at cardiac rehab        Comments: See ITP comments.Hadassah Elpidio Quan RN BSN

## 2023-12-07 ENCOUNTER — Encounter (HOSPITAL_COMMUNITY)
Admission: RE | Admit: 2023-12-07 | Discharge: 2023-12-07 | Disposition: A | Discharge status: Home / Self Care | Source: Ambulatory Visit | Attending: Cardiovascular Disease | Admitting: Cardiovascular Disease

## 2023-12-07 DIAGNOSIS — Z951 Presence of aortocoronary bypass graft: Secondary | ICD-10-CM

## 2023-12-10 ENCOUNTER — Encounter (HOSPITAL_COMMUNITY)
Admission: RE | Admit: 2023-12-10 | Discharge: 2023-12-10 | Disposition: A | Discharge status: Home / Self Care | Source: Ambulatory Visit | Attending: Cardiovascular Disease | Admitting: Cardiovascular Disease

## 2023-12-10 DIAGNOSIS — Z951 Presence of aortocoronary bypass graft: Secondary | ICD-10-CM | POA: Diagnosis not present

## 2023-12-12 ENCOUNTER — Encounter (HOSPITAL_COMMUNITY)
Admission: RE | Admit: 2023-12-12 | Discharge: 2023-12-12 | Disposition: A | Discharge status: Home / Self Care | Source: Ambulatory Visit | Attending: Cardiovascular Disease | Admitting: Cardiovascular Disease

## 2023-12-12 DIAGNOSIS — Z951 Presence of aortocoronary bypass graft: Secondary | ICD-10-CM | POA: Diagnosis not present

## 2023-12-14 ENCOUNTER — Encounter (HOSPITAL_COMMUNITY)
Admission: RE | Admit: 2023-12-14 | Discharge: 2023-12-14 | Disposition: A | Discharge status: Home / Self Care | Source: Ambulatory Visit | Attending: Cardiovascular Disease | Admitting: Cardiovascular Disease

## 2023-12-14 DIAGNOSIS — Z951 Presence of aortocoronary bypass graft: Secondary | ICD-10-CM | POA: Diagnosis not present

## 2023-12-17 ENCOUNTER — Encounter (HOSPITAL_COMMUNITY)
Admission: RE | Admit: 2023-12-17 | Discharge: 2023-12-17 | Disposition: A | Discharge status: Home / Self Care | Source: Ambulatory Visit | Attending: Cardiovascular Disease | Admitting: Cardiovascular Disease

## 2023-12-17 DIAGNOSIS — Z951 Presence of aortocoronary bypass graft: Secondary | ICD-10-CM | POA: Diagnosis not present

## 2023-12-19 ENCOUNTER — Encounter (HOSPITAL_COMMUNITY)
Admission: RE | Admit: 2023-12-19 | Discharge: 2023-12-19 | Disposition: A | Discharge status: Home / Self Care | Source: Ambulatory Visit | Attending: Cardiovascular Disease | Admitting: Cardiovascular Disease

## 2023-12-19 DIAGNOSIS — Z951 Presence of aortocoronary bypass graft: Secondary | ICD-10-CM | POA: Diagnosis not present

## 2023-12-21 ENCOUNTER — Encounter (HOSPITAL_COMMUNITY)
Admission: RE | Admit: 2023-12-21 | Discharge: 2023-12-21 | Disposition: A | Discharge status: Home / Self Care | Source: Ambulatory Visit | Attending: Cardiovascular Disease | Admitting: Cardiovascular Disease

## 2023-12-21 DIAGNOSIS — Z951 Presence of aortocoronary bypass graft: Secondary | ICD-10-CM | POA: Diagnosis not present

## 2023-12-24 ENCOUNTER — Encounter (HOSPITAL_COMMUNITY)
Admission: RE | Admit: 2023-12-24 | Discharge: 2023-12-24 | Disposition: A | Discharge status: Home / Self Care | Source: Ambulatory Visit | Attending: Cardiovascular Disease | Admitting: Cardiovascular Disease

## 2023-12-24 DIAGNOSIS — Z951 Presence of aortocoronary bypass graft: Secondary | ICD-10-CM

## 2023-12-26 ENCOUNTER — Encounter (HOSPITAL_COMMUNITY)
Admission: RE | Admit: 2023-12-26 | Discharge: 2023-12-26 | Disposition: A | Discharge status: Home / Self Care | Source: Ambulatory Visit | Attending: Cardiovascular Disease | Admitting: Cardiovascular Disease

## 2023-12-26 DIAGNOSIS — Z951 Presence of aortocoronary bypass graft: Secondary | ICD-10-CM | POA: Diagnosis not present

## 2023-12-28 ENCOUNTER — Encounter (HOSPITAL_COMMUNITY)
Admission: RE | Admit: 2023-12-28 | Discharge: 2023-12-28 | Disposition: A | Discharge status: Home / Self Care | Source: Ambulatory Visit | Attending: Cardiovascular Disease | Admitting: Cardiovascular Disease

## 2023-12-28 DIAGNOSIS — Z951 Presence of aortocoronary bypass graft: Secondary | ICD-10-CM | POA: Diagnosis not present

## 2023-12-31 ENCOUNTER — Encounter (HOSPITAL_COMMUNITY)
Admission: RE | Admit: 2023-12-31 | Discharge: 2023-12-31 | Disposition: A | Discharge status: Home / Self Care | Source: Ambulatory Visit | Attending: Cardiovascular Disease | Admitting: Cardiovascular Disease

## 2023-12-31 DIAGNOSIS — Z48812 Encounter for surgical aftercare following surgery on the circulatory system: Secondary | ICD-10-CM | POA: Insufficient documentation

## 2023-12-31 DIAGNOSIS — Z951 Presence of aortocoronary bypass graft: Secondary | ICD-10-CM | POA: Insufficient documentation

## 2024-01-01 NOTE — Progress Notes (Signed)
 Cardiac Individual Treatment Plan  Patient Details  Name: [name]. 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 (Patient not taking: Reported on 10/11/2023), Disp: 10 tablet, Rfl: 0   amiodarone  (PACERONE ) 200 MG tablet, Take one tablet twice daily until July 01/2024, and then on July 13/2025, start taking one tablet daily, Disp: 42 tablet, Rfl: 0   apixaban  (ELIQUIS ) 5 MG TABS tablet, Take 5 mg by mouth 2 (two) times daily., Disp: , Rfl:    aspirin  EC 81 MG tablet, Take 1 tablet (81 mg total) by mouth daily. Swallow whole., Disp: , Rfl:    Azelastine  HCl 137 MCG/SPRAY SOLN, Place 1 spray into both nostrils 2 (two) times daily. , Disp: , Rfl:    carvedilol  (COREG ) 12.5 MG tablet, Take 1 tablet (12.5 mg total) by mouth 2 (two) times daily., Disp: , Rfl:    Cholecalciferol (VITAMIN D) 50 MCG (2000 UT) CAPS, Take 1 capsule by mouth at bedtime., Disp: , Rfl:    Coenzyme Q10 (COQ10) 100 MG CAPS, Take 100 mg by mouth daily., Disp: , Rfl:    cromolyn  (OPTICROM ) 4 % ophthalmic solution, Place 1 drop into both eyes every 6 (six) hours  as needed (itchy eyes)., Disp: , Rfl:    empagliflozin  (JARDIANCE ) 10 MG TABS tablet, Take 1 tablet (10 mg total) by mouth daily. (Patient not taking: Reported on 10/11/2023), Disp: 30 tablet, Rfl: 0   feeding supplement (ENSURE PLUS HIGH PROTEIN) LIQD, Take 237 mLs by mouth 2 (two) times daily between meals. (Patient not taking: Reported on 10/11/2023), Disp: 14220 mL, Rfl: 0   ferrous gluconate  (FERGON) 324 MG tablet, Take 1 tablet (324 mg total) by mouth daily with breakfast. (Patient not taking: Reported on 10/11/2023), Disp: 30 tablet, Rfl: 0   fluticasone  (FLONASE ) 50 MCG/ACT nasal spray, Place 2 sprays into both nostrils 2 (two) times daily., Disp: , Rfl:    furosemide  (LASIX ) 40 MG tablet, Take 1 tablet (40 mg total) by mouth 2 (two) times daily. (Patient taking differently: Take 20 mg by mouth daily.), Disp: 30 tablet, Rfl: 0   Glucosamine-Chondroit-Vit C-Mn (GLUCOSAMINE 1500 COMPLEX PO), Take 1 tablet by mouth at bedtime., Disp: , Rfl:    hydrOXYzine  (VISTARIL ) 25 MG capsule, Take 25 mg by mouth 2 (two) times daily as needed (sleep)., Disp: , Rfl:    insulin  aspart (NOVOLOG ) 100 UNIT/ML injection, Inject 0-8 Units into the skin 3 (three) times daily before meals. Inject as per sliding scale: 0-150 = 0 units 151-200 = 2 units 201-250 = 3 units 251-300 = 4 units 301-350 = 6  units 351-400 = 8 units 401-999 = 8 units, Call MD, Disp: , Rfl:    insulin  glargine-yfgn (SEMGLEE ) 100 UNIT/ML injection, Inject 0.2 mLs (20 Units total) into the skin daily., Disp: 10 mL, Rfl: 11   loperamide  (IMODIUM ) 2 MG capsule, Take 1 capsule (2 mg total) by mouth every 8 (eight) hours as needed for diarrhea or loose stools. (Patient not taking: Reported on 09/17/2023), Disp: 10 capsule, Rfl: 0   losartan (COZAAR) 25 MG tablet, Take 25 mg by mouth daily. (Patient not taking: Reported on 10/11/2023), Disp: , Rfl:    MAGNESIUM  OXIDE PO, Take 500 mg by mouth at bedtime., Disp: , Rfl:    Multiple Vitamin (MULTIVITAMIN WITH  MINERALS) TABS, Take 1 tablet by mouth every evening. , Disp: , Rfl:    neomycin-polymyxin-hydrocortisone (CORTISPORIN) OTIC solution, Place 4 drops into the right ear 4 (four) times daily as needed (itchy ears)., Disp: , Rfl:    olopatadine  (PATANOL) 0.1 % ophthalmic solution, Place 1 drop into both eyes 2 (two) times daily., Disp: , Rfl:    polyethylene glycol powder (GLYCOLAX /MIRALAX ) 17 GM/SCOOP powder, Take 34 g by mouth daily as needed for mild constipation or moderate constipation., Disp: 255 g, Rfl: 0   potassium chloride  (MICRO-K ) 10 MEQ CR capsule, Take 10 mEq by mouth 2 (two) times daily. (Patient taking differently: Take 10 mEq by mouth daily.), Disp: , Rfl:    predniSONE  (STERAPRED UNI-PAK 21 TAB) 10 MG (21) TBPK tablet, Take one tablet 4 times daily for one day, then one tablet 3 times per day for two days, then one tablet twicedaily for two days, then 1 tablet daily for two days, then half tablet daily for two days then stop. (Patient not taking: Reported on 10/11/2023), Disp: 17 tablet, Rfl: 0   Probiotic Product (PROBIOTIC DAILY PO), Take 2 capsules by mouth daily. (Patient not taking: Reported on 10/11/2023), Disp: , Rfl:    spironolactone  (ALDACTONE ) 25 MG tablet, Take 1 tablet (25 mg total) by mouth daily. (Patient not taking: Reported on 10/11/2023), Disp: 30 tablet, Rfl: 0   terazosin  (HYTRIN ) 2 MG capsule, Take 2 mg by mouth at bedtime., Disp: , Rfl:    Tiotropium Bromide  Monohydrate (SPIRIVA  RESPIMAT) 2.5 MCG/ACT AERS, Inhale 2 puffs into the lungs daily., Disp: , Rfl:    traZODone  (DESYREL ) 100 MG tablet, Take 100 mg by mouth at bedtime., Disp: , Rfl:    vitamin B-12 (CYANOCOBALAMIN) 500 MCG tablet, Take 500 mcg by mouth every evening., Disp: , Rfl:   Past Medical History: Past Medical History:  Diagnosis Date   Allergy    Anxiety    Arthritis    Cataract    bilateral lens implants   Clotting disorder    DVT in 2006 x1; no problems since   Colon stricture - diverticular  (HCC) 09/07/2019   Diabetes mellitus    2   Diverticulitis    Diverticulosis    DVT (deep venous thrombosis) (HCC) 2007   left leg   Hepatitis 1973   treated at [facility]   HLD (hyperlipidemia)    Hypertension    Hypertensive urgency 07/31/2019   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   Neuromuscular disorder (HCC)    neuropathy   Non-toxic multinodular goiter 07/28/2023   Other psoriasis 07/28/2023   Paroxysmal atrial fibrillation (HCC) 09/22/2021   Peripheral vascular disease 2006   dvt 's   Personal history of colon polyps, unspecified 07/28/2023  Phlebitis and thrombophlebitis of lower extremities 07/28/2023   Aug 28, 2008 Entered By: POLLY SNELLEN Comment: h/o this in 2006     Pneumonia 2006   RBBB    S/P rotator cuff repair 05/20/2015   Sleep apnea    Subdural hematoma (HCC)     Tobacco Use: Social History   Tobacco Use  Smoking Status Former   Current packs/day: 0.00   Average packs/day: 1 pack/day for 15.0 years (15.0 ttl pk-yrs)   Types: Cigarettes   Start date: 1964   Quit date: 1979   Years since quitting: 46.8  Smokeless Tobacco Never    Labs: Review Flowsheet  More data exists      Latest Ref Rng & Units 07/24/2023 07/25/2023 07/27/2023 07/30/2023 07/31/2023  Labs for ITP Cardiac and Pulmonary Rehab  Cholestrol 0 - 200 mg/dL - 785  - - -  LDL (calc) 0 - 99 mg/dL - 845  - - -  HDL-C >59 mg/dL - 44  - - -  Trlycerides <150 mg/dL - 79  - - -  Hemoglobin A1c 4.8 - 5.6 % 6.2  - - - -  PH, Arterial 7.35 - 7.45 - - 7.419  7.345  7.342  7.393  7.341  7.330  7.451   PCO2 arterial 32 - 48 mmHg - - 46.3  44.0  51.9  44.5  52.5  46.9  32.0   Bicarbonate 20.0 - 28.0 mmol/L - - 29.9  30.9  24.2  28.1  27.2  28.4  24.8  22.3   TCO2 22 - 32 mmol/L - - 31  32  26  27  30  30   32  29  28  30  30  26  23    Acid-base deficit 0.0 - 2.0 mmol/L - - - 2.0  1.0  1.0   O2 Saturation % - - 98  75  94  100  100  100  97  98      Details       Multiple values from one day are sorted in reverse-chronological order         Capillary Blood Glucose: Lab Results  Component Value Date   GLUCAP 109 (H) 10/15/2023   GLUCAP 139 (H) 10/15/2023   GLUCAP 107 (H) 10/11/2023   GLUCAP 157 (H) 08/27/2023   GLUCAP 249 (H) 08/26/2023     Exercise Target Goals: Exercise Program Goal: Individual exercise prescription set using results from initial 6 min walk test and THRR while considering  patient's activity barriers and safety.   Exercise Prescription Goal: Initial exercise prescription builds to 30-45 minutes a day of aerobic activity, 2-3 days per week.  Home exercise guidelines will be given to patient during program as part of exercise prescription that the participant will acknowledge.  Activity Barriers & Risk Stratification:  Activity Barriers & Cardiac Risk Stratification - 10/11/23 1337       Activity Barriers & Cardiac Risk Stratification   Activity Barriers Balance Concerns;History of Falls;Joint Problems;Arthritis;Back Problems;Deconditioning;Neck/Spine Problems;Shortness of Breath;Other (comment)    Comments lightheadedness    Cardiac Risk Stratification High          6 Minute Walk:  6 Minute Walk     Row Name 10/11/23 1342         6 Minute Walk   Phase Initial     Distance 720 feet     Walk Time 6 minutes     # of Rest Breaks 1  5:08-6:00  MPH 1.4     METS 1.23     RPE 14     Perceived Dyspnea  1     VO2 Peak 4.3     Symptoms No     Resting HR 63 bpm     Resting BP 114/72     Resting Oxygen  Saturation  97 %     Exercise Oxygen  Saturation  during 6 min walk 98 %     Max Ex. HR 73 bpm     Max Ex. BP 122/74     2 Minute Post BP 120/74        Oxygen  Initial Assessment:   Oxygen  Re-Evaluation:   Oxygen  Discharge (Final Oxygen  Re-Evaluation):   Initial Exercise Prescription:  Initial Exercise Prescription - 10/11/23 1300       Date of Initial Exercise RX and Referring  Provider   Date 10/11/23    Referring Provider Ozell Fell, MD    Expected Discharge Date 01/02/24      Recumbant Bike   Level 1    RPM 60    Watts 25    Minutes 15    METs 1.2      NuStep   Level 1    SPM 75    Minutes 15    METs 1.2      Prescription Details   Frequency (times per week) 3    Duration Progress to 30 minutes of continuous aerobic without signs/symptoms of physical distress      Intensity   THRR 40-80% of Max Heartrate 59-118    Ratings of Perceived Exertion 11-13    Perceived Dyspnea 0-4      Progression   Progression Continue progressive overload as per policy without signs/symptoms or physical distress.      Resistance Training   Training Prescription Yes    Weight 3 lbs    Reps 10-15          Perform Capillary Blood Glucose checks as needed.  Exercise Prescription Changes:   Exercise Prescription Changes     Row Name 10/15/23 1637 10/31/23 1630 11/05/23 1635 11/16/23 1659 11/30/23 1640     Response to Exercise   Blood Pressure (Admit) 154/72 130/58 136/56 120/70 120/70   Blood Pressure (Exercise) 154/70 128/74 154/62 -- --   Blood Pressure (Exit) 112/72 140/75 124/68 110/70 122/52   Heart Rate (Admit) 68 bpm 65 bpm 65 bpm 59 bpm 66 bpm   Heart Rate (Exercise) 78 bpm 73 bpm 79 bpm 78 bpm 94 bpm   Heart Rate (Exit) 70 bpm 66 bpm 67 bpm 64 bpm 39 bpm   Rating of Perceived Exertion (Exercise) 12 11 11 12  11.5   Perceived Dyspnea (Exercise) 0 0 0 0 0   Symptoms Dizzy during wts, resolved -- -- -- --   Comments Pt first day in the [facility] program Reviewed MET's, goals and home ExRx Reviewed home ExRx Reviewed MET's Reviewed MET's goals   Duration Progress to 30 minutes of  aerobic without signs/symptoms of physical distress Progress to 30 minutes of  aerobic without signs/symptoms of physical distress Progress to 30 minutes of  aerobic without signs/symptoms of physical distress Progress to 30 minutes of  aerobic without signs/symptoms  of physical distress Progress to 30 minutes of  aerobic without signs/symptoms of physical distress   Intensity THRR unchanged THRR unchanged THRR unchanged THRR unchanged THRR unchanged     Progression   Progression Continue to progress workloads to maintain intensity without signs/symptoms of  physical distress. Continue to progress workloads to maintain intensity without signs/symptoms of physical distress. Continue to progress workloads to maintain intensity without signs/symptoms of physical distress. Continue to progress workloads to maintain intensity without signs/symptoms of physical distress. Continue to progress workloads to maintain intensity without signs/symptoms of physical distress.   Average METs 1.55 1.8 1.95 2.05 2.4     Resistance Training   Training Prescription Yes No Yes Yes Yes   Weight 3 lbs 3 lbs 4 lbs 4 lbs 5 lbs   Reps 10-15 10-15 10-15 10-15 10-15   Time 10 Minutes 10 Minutes 10 Minutes 10 Minutes 10 Minutes     Recumbant Bike   Level 1 2 2 3 3    RPM 34 56 55 58 55   Watts 8 13 15 29 27    Minutes 15 15 15 15 15    METs 1.7 1.9 2.2 2.4 2.4     NuStep   Level 1 1 1 2 3    SPM 53 63 70 76 85   Minutes 15 15 15 15 15    METs 1.4 1.7 1.7 1.7 2.4     Home Exercise Plan   Plans to continue exercise at -- -- [employer] (comment) [employer] (comment) [employer] (comment)   Frequency -- -- Add 2 additional days to program exercise sessions. Add 2 additional days to program exercise sessions. Add 2 additional days to program exercise sessions.   Initial Home Exercises Provided -- -- 11/05/23 11/05/23 11/05/23    Row Name 12/24/23 1659             Response to Exercise   Blood Pressure (Admit) 130/62       Blood Pressure (Exit) 112/60       Heart Rate (Admit) 62 bpm       Heart Rate (Exercise) 87 bpm       Heart Rate (Exit) 66 bpm       Rating of Perceived Exertion (Exercise) 12       Perceived Dyspnea (Exercise) 0       Comments  Reviewed MET's and goals       Duration Progress to 30 minutes of  aerobic without signs/symptoms of physical distress       Intensity THRR unchanged         Progression   Progression Continue to progress workloads to maintain intensity without signs/symptoms of physical distress.       Average METs 2.3         Resistance Training   Training Prescription No       Weight 5 lbs       Reps 10-15       Time 10 Minutes         Recumbant Bike   Level 3       RPM 60       Watts 27       Minutes 15       METs 2.7         NuStep   Level 4       SPM 70       Minutes 15       METs 1.9         Home Exercise Plan   Plans to continue exercise at [employer] (comment)       Frequency Add 2 additional days to program exercise sessions.       Initial Home Exercises Provided 11/05/23  Exercise Comments:   Exercise Comments     Row Name 10/15/23 1644 10/31/23 1630 11/01/23 1004 11/05/23 1638 11/16/23 1703   Exercise Comments Pt first day in the [facility] program. Pt tolerated exercise well with some mild dizziness post exercise that quickly resolved. Average MET's today 1.55. He is off to a good start and is learning his THRR, RPE and ExRx Reviewed MET's and goals. Pt tolerated exercise well with an average MET level of 1.8. Pt is doing well and progressing MET's. talked today about increasing SPM on the nustep and he increased his WL on the [facility] Bike. Overall doing well. Will go over home exercise soon. He has a memebership to [employer] and [employer] Reviewed MET's and goals. Pt tolerated exercise well with an average MET level of 1.8. Pt is doing well and progressing MET's. talked today about increasing SPM on the nustep and he increased his WL on the [facility] Bike. Overall doing well. Will go over home exercise soon. He has a memebership to [employer] and [employer] Reviewed home ExRx. Pt tolerated exercise well with an average MET level of 1.95. Pt is doing well and progressing MET's  gradually. He is going to exercise on his own by going to [employer], [employer] [facility], walking and weights. Talked about gradual weight lifting progression since he is off his sternal precautions. He plans to exercise 2 days for 30-45 mins (broken up into 10 mis sessions as needed). Reviewed MET's. Pt tolerated exercise well with an average MET level of 2.05. Patient is making good progress and moved up on both stations today. Will continue to monitor patient and progress workloads as tolerated to help increase endurance    Row Name 11/30/23 1646 12/19/23 1704 12/24/23 1702       Exercise Comments Reviewed MET's and goals. Pt tolerated exercise well with an average MET level of 2.4. Pt is doing well and progressing MET's and WL's. He's able to do some extra side work that he enjoys and feels he has more energy overall. He's been trying some youtube classes and plans to add in some tai chi and maybe some classes at the [facility] Reviewed MET's and goals. Pt tolerated exercise well with an average MET level of 2.3. Pt is doing well no progression of MET's this time, but feels good with exercise --        Exercise Goals and Review:   Exercise Goals     Row Name 10/11/23 1110             Exercise Goals   Increase Physical Activity Yes       Intervention Provide advice, education, support and counseling about physical activity/exercise needs.;Develop an individualized exercise prescription for aerobic and resistive training based on initial evaluation findings, risk stratification, comorbidities and participant's personal goals.       Expected Outcomes Short Term: Attend rehab on a regular basis to increase amount of physical activity.;Long Term: Exercising regularly at least 3-5 days a week.;Long Term: Add in home exercise to make exercise part of routine and to increase amount of physical activity.       Increase Strength and Stamina Yes       Intervention Provide advice, education, support and  counseling about physical activity/exercise needs.;Develop an individualized exercise prescription for aerobic and resistive training based on initial evaluation findings, risk stratification, comorbidities and participant's personal goals.       Expected Outcomes Short Term: Increase workloads from initial exercise prescription  for resistance, speed, and METs.;Short Term: Perform resistance training exercises routinely during rehab and add in resistance training at home;Long Term: Improve cardiorespiratory fitness, muscular endurance and strength as measured by increased METs and functional capacity ( )       Able to understand and use rate of perceived exertion (RPE) scale Yes       Intervention Provide education and explanation on how to use RPE scale       Expected Outcomes Short Term: Able to use RPE daily in rehab to express subjective intensity level;Long Term:  Able to use RPE to guide intensity level when exercising independently       Knowledge and understanding of Target Heart Rate Range (THRR) Yes       Intervention Provide education and explanation of THRR including how the numbers were predicted and where they are located for reference       Expected Outcomes Short Term: Able to state/look up THRR;Long Term: Able to use THRR to govern intensity when exercising independently;Short Term: Able to use daily as guideline for intensity in rehab       Understanding of Exercise Prescription Yes       Intervention Provide education, explanation, and written materials on patient's individual exercise prescription       Expected Outcomes Short Term: Able to explain program exercise prescription;Long Term: Able to explain home exercise prescription to exercise independently          Exercise Goals Re-Evaluation :  Exercise Goals Re-Evaluation     Row Name 10/15/23 1642 10/31/23 1630 11/30/23 1642         Exercise Goal Re-Evaluation   Exercise Goals Review Increase Physical  Activity;Understanding of Exercise Prescription;Increase Strength and Stamina;Knowledge and understanding of Target Heart Rate Range (THRR);Able to understand and use rate of perceived exertion (RPE) scale Increase Physical Activity;Understanding of Exercise Prescription;Increase Strength and Stamina;Knowledge and understanding of Target Heart Rate Range (THRR);Able to understand and use rate of perceived exertion (RPE) scale Increase Physical Activity;Understanding of Exercise Prescription;Increase Strength and Stamina;Knowledge and understanding of Target Heart Rate Range (THRR);Able to understand and use rate of perceived exertion (RPE) scale     Comments Pt first day in the [facility] program. Pt tolerated exercise well with some mild dizziness post exercise that quickly resolved. Average MET's today 1.55. He is off to a good start and is learning his THRR, RPE and ExRx Reviewed MET's and goals. Pt tolerated exercise well with an average MET level of 1.8. Pt is doing well and progressing MET's. talked today about increasing SPM on the nustep and he increased his WL on the [facility] Bike. Overall doing well. Will go over home exercise soon. He has a memebership to [employer] and [employer] Reviewed MET's and goals. Pt tolerated exercise well with an average MET level of 2.4. Pt is doing well and progressing MET's and WL's. He's able to do some extra side work that he enjoys and feels he has more energy overall. He's been trying some youtube classes and plans to add in some tai chi and maybe some classes at the [facility].     Expected Outcomes Will continue to monitor pt and progress workloads as tolerated without sign or symptom Will continue to monitor pt and progress workloads as tolerated without sign or symptom Will continue to monitor pt and progress workloads as tolerated without sign or symptom        Discharge Exercise Prescription (Final Exercise Prescription Changes):  Exercise Prescription Changes -  12/24/23  1659       Response to Exercise   Blood Pressure (Admit) 130/62    Blood Pressure (Exit) 112/60    Heart Rate (Admit) 62 bpm    Heart Rate (Exercise) 87 bpm    Heart Rate (Exit) 66 bpm    Rating of Perceived Exertion (Exercise) 12    Perceived Dyspnea (Exercise) 0    Comments Reviewed MET's and goals    Duration Progress to 30 minutes of  aerobic without signs/symptoms of physical distress    Intensity THRR unchanged      Progression   Progression Continue to progress workloads to maintain intensity without signs/symptoms of physical distress.    Average METs 2.3      Resistance Training   Training Prescription No    Weight 5 lbs    Reps 10-15    Time 10 Minutes      Recumbant Bike   Level 3    RPM 60    Watts 27    Minutes 15    METs 2.7      NuStep   Level 4    SPM 70    Minutes 15    METs 1.9      Home Exercise Plan   Plans to continue exercise at [employer] (comment)    Frequency Add 2 additional days to program exercise sessions.    Initial Home Exercises Provided 11/05/23          Nutrition:  Target Goals: Understanding of nutrition guidelines, daily intake of sodium 1500mg , cholesterol 200mg , calories 30% from fat and 7% or less from saturated fats, daily to have 5 or more servings of fruits and vegetables.  Biometrics:  Pre Biometrics - 10/11/23 1100       Pre Biometrics   Waist Circumference 51.25 inches    Hip Circumference 45 inches    Waist to Hip Ratio 1.14 %    Triceps Skinfold 25 mm    % Body Fat 31.7 %    Grip Strength 14 kg    Flexibility --   No performed; active low back pain 4/10   Single Leg Stand 1 seconds           Nutrition Therapy Plan and Nutrition Goals:  Nutrition Therapy & Goals - 10/16/23 1046       Nutrition Therapy   Diet Heart healthy diet      Personal Nutrition Goals   Nutrition Goal Patient to identify strategies for reducing cardiovascular risk by attending the [facility] education and nutrition  series weekly.    Personal Goal #2 Patient to improve diet quality by using the plate method as a guide for meal planning to include lean protein/plant protein, fruits, vegetables, whole grains, nonfat dairy as part of a well-balanced diet.    Comments Patient has medical history of CABGx3, DM2, PAF, hyperlipidemia, HTN, former smoker, COPD, NSTEMI. LDL is not at goal; he continues praluent as he is statin intolerant. A1c is well controlled in a prediabetic range. Patient will benefit from participation in intensive cardiac rehab for nutrition education, exercise, and lifestyle modification.      Intervention Plan   Intervention Prescribe, educate and counsel regarding individualized specific dietary modifications aiming towards targeted core components such as weight, hypertension, lipid management, diabetes, heart failure and other comorbidities.;Nutrition handout(s) given to patient.    Expected Outcomes Short Term Goal: Understand basic principles of dietary content, such as calories, fat, sodium, cholesterol and nutrients.;Long Term Goal: Adherence to  prescribed nutrition plan.          Nutrition Assessments:  Nutrition Assessments - 10/19/23 1701       MEDFICTS Scores   Pre Score 64         MEDIFICTS Score Key: >=70 Need to make dietary changes  40-70 Heart Healthy Diet <= 40 Therapeutic Level Cholesterol Diet   Flowsheet Row INTENSIVE CARDIAC REHAB from 10/22/2023 in [facility]  Picture Your Plate Total Score on Admission 64   Picture Your Plate Scores: <59 Unhealthy dietary pattern with much room for improvement. 41-50 Dietary pattern unlikely to meet recommendations for good health and room for improvement. 51-60 More healthful dietary pattern, with some room for improvement.  >60 Healthy dietary pattern, although there may be some specific behaviors that could be improved.    Nutrition Goals Re-Evaluation:   Nutrition Goals Re-Evaluation     Row Name 10/16/23 1046             Goals   Current Weight 246 lb 4.1 oz (111.7 kg)       Comment cholesterol 214, LDL 154, HDL 44       Expected Outcome Patient has medical history of CABGx3, DM2, PAF, hyperlipidemia, HTN, former smoker, COPD, NSTEMI. LDL is not at goal; he continues praluent as he is statin intolerant. A1c is well controlled in a prediabetic range. Patient will benefit from participation in intensive cardiac rehab for nutrition education, exercise, and lifestyle modification.          Nutrition Goals Re-Evaluation:  Nutrition Goals Re-Evaluation     Row Name 10/16/23 1046             Goals   Current Weight 246 lb 4.1 oz (111.7 kg)       Comment cholesterol 214, LDL 154, HDL 44       Expected Outcome Patient has medical history of CABGx3, DM2, PAF, hyperlipidemia, HTN, former smoker, COPD, NSTEMI. LDL is not at goal; he continues praluent as he is statin intolerant. A1c is well controlled in a prediabetic range. Patient will benefit from participation in intensive cardiac rehab for nutrition education, exercise, and lifestyle modification.          Nutrition Goals Discharge (Final Nutrition Goals Re-Evaluation):  Nutrition Goals Re-Evaluation - 10/16/23 1046       Goals   Current Weight 246 lb 4.1 oz (111.7 kg)    Comment cholesterol 214, LDL 154, HDL 44    Expected Outcome Patient has medical history of CABGx3, DM2, PAF, hyperlipidemia, HTN, former smoker, COPD, NSTEMI. LDL is not at goal; he continues praluent as he is statin intolerant. A1c is well controlled in a prediabetic range. Patient will benefit from participation in intensive cardiac rehab for nutrition education, exercise, and lifestyle modification.          Psychosocial: Target Goals: Acknowledge presence or absence of significant depression and/or stress, maximize coping skills, provide positive support system. Participant is able to verbalize types and  ability to use techniques and skills needed for reducing stress and depression.  Initial Review & Psychosocial Screening:  Initial Psych Review & Screening - 10/11/23 1308       Initial Review   Current issues with Current Stress Concerns;Current Anxiety/Panic;Current Sleep Concerns;Current Psychotropic Meds   related to health   Source of Stress Concerns Financial;Family      Family Dynamics   Good Support System? Yes    Comments Pt has  good support system with his wife and daughters      Barriers   Psychosocial barriers to participate in program The patient should benefit from training in stress management and relaxation.;There are no identifiable barriers or psychosocial needs.      Screening Interventions   Interventions Encouraged to exercise;Provide feedback about the scores to participant    Expected Outcomes Long Term Goal: Stressors or current issues are controlled or eliminated.;Short Term goal: Identification and review with participant of any Quality of Life or Depression concerns found by scoring the questionnaire.;Long Term goal: The participant improves quality of Life and PHQ9 Scores as seen by post scores and/or verbalization of changes          Quality of Life Scores:  Quality of Life - 12/31/23 1718       Quality of Life Scores   Health/Function Post 22.75 %    Socioeconomic Post 22.69 %    Psych/Spiritual Post 18.71 %    Family Post 16.4 %    GLOBAL Post 20.97 %         Scores of 19 and below usually indicate a poorer quality of life in these areas.  A difference of  2-3 points is a clinically meaningful difference.  A difference of 2-3 points in the total score of the Quality of Life Index has been associated with significant improvement in overall quality of life, self-image, physical symptoms, and general health in studies assessing change in quality of life.  PHQ-9: Review Flowsheet       10/11/2023 10/17/2014  Depression screen PHQ 2/9  Decreased  Interest 0 0  Down, Depressed, Hopeless 0 0  PHQ - 2 Score 0 0  Altered sleeping 2 -  Tired, decreased energy 2 -  Change in appetite 1 -  Feeling bad or failure about yourself  1 -  Trouble concentrating 0 -  Moving slowly or fidgety/restless 0 -  Suicidal thoughts 0 -  PHQ-9 Score 6 -  Difficult doing work/chores Not difficult at all -   Interpretation of Total Score  Total Score Depression Severity:  1-4 = Minimal depression, 5-9 = Mild depression, 10-14 = Moderate depression, 15-19 = Moderately severe depression, 20-27 = Severe depression   Psychosocial Evaluation and Intervention:   Psychosocial Re-Evaluation:  Psychosocial Re-Evaluation     Row Name 11/06/23 1652 12/05/23 1423 01/01/24 1042         Psychosocial Re-Evaluation   Current issues with Current Stress Concerns;Current Psychotropic Meds;Current Anxiety/Panic Current Stress Concerns;Current Psychotropic Meds;Current Anxiety/Panic None Identified     Comments Dick has not voiced any increased concerns or stressors during exercise at cardiac rehab. will review PHQ9 and quality of life questionnaire in the near future. PHQ9 and quality of life questionnaire Reviewed. Kurt says that his energy level has improved and he is sleeping better. Rich also says he ahs an improvement in his shortness of breath. Dick has not voiced any increased concerns or stressors during exercise at cardiac rehab.     Expected Outcomes Dick will have controlled or decreased anxiety/ stressors upon compleiton of cardiac rehab. Dick will have controlled or decreased anxiety/ stressors upon compleiton of cardiac rehab. Dick will have controlled or decreased anxiety/ stressors upon compleiton of cardiac rehab.     Interventions Stress management education;Relaxation education;Encouraged to attend Cardiac Rehabilitation for the exercise Stress management education;Relaxation education;Encouraged to attend Cardiac Rehabilitation for the exercise Stress  management education;Relaxation education;Encouraged to attend Cardiac Rehabilitation for the exercise  Continue Psychosocial Services  Follow up required by staff No Follow up required No Follow up required       Initial Review   Source of Stress Concerns Financial;Chronic Illness;Family Financial;Chronic Illness;Family Financial;Chronic Illness;Family     Comments Will continue to monitor and offer support as needed. Will continue to monitor and offer support as needed. Will continue to monitor and offer support as needed.        Psychosocial Discharge (Final Psychosocial Re-Evaluation):  Psychosocial Re-Evaluation - 01/01/24 1042       Psychosocial Re-Evaluation   Current issues with None Identified    Comments Dick has not voiced any increased concerns or stressors during exercise at cardiac rehab.    Expected Outcomes Dick will have controlled or decreased anxiety/ stressors upon compleiton of cardiac rehab.    Interventions Stress management education;Relaxation education;Encouraged to attend Cardiac Rehabilitation for the exercise    Continue Psychosocial Services  No Follow up required      Initial Review   Source of Stress Concerns Financial;Chronic Illness;Family    Comments Will continue to monitor and offer support as needed.          Vocational Rehabilitation: Provide vocational rehab assistance to qualifying candidates.   Vocational Rehab Evaluation & Intervention:  Vocational Rehab - 10/11/23 1355       Initial Vocational Rehab Evaluation & Intervention   Assessment shows need for Vocational Rehabilitation No   Pt is retired         Education: Education Goals: Education classes will be provided on a weekly basis, covering required topics. Participant will state understanding/return demonstration of topics presented.    Education     Row Name 10/15/23 1400     Education   Cardiac Education Topics Pritikin   Select Workshops     Workshops    Educator Exercise Physiologist   Select Psychosocial   Psychosocial Workshop Recognizing and Reducing Stress   Instruction Review Code 1- Verbalizes Understanding   Class Start Time 1405   Class Stop Time 1448   Class Time Calculation (min) 43 min    Row Name 10/17/23 1400     Education   Cardiac Education Topics Pritikin   Customer Service Manager   Weekly Topic [employer] Desserts   Instruction Review Code 1- Verbalizes Understanding   Class Start Time 1400   Class Stop Time 1450   Class Time Calculation (min) 50 min    Row Name 10/19/23 1500     Education   Cardiac Education Topics Pritikin   Licensed Conveyancer Nutrition   Nutrition Calorie Density   Instruction Review Code 1- Verbalizes Understanding   Class Start Time 1400   Class Stop Time 1435   Class Time Calculation (min) 35 min    Row Name 10/22/23 1400     Education   Cardiac Education Topics Pritikin   Geographical Information Systems Officer Exercise   Exercise Workshop Exercise Basics: Diplomatic Services Operational Officer   Instruction Review Code 1- Verbalizes Understanding   Class Start Time 1420   Class Stop Time 1500   Class Time Calculation (min) 40 min    Row Name 10/24/23 1100     Education   Cardiac Education Topics Pritikin   [employer]  Educator Dietitian   Weekly Topic Efficiency Cooking - Meals in a Snap   Instruction Review Code 1- Verbalizes Understanding   Class Start Time 1145   Class Stop Time 1230   Class Time Calculation (min) 45 min    Row Name 10/26/23 1400     Education   Cardiac Education Topics Pritikin   Psychologist, Forensic Exercise Education   Exercise Education Move It!   Instruction Review Code 1- Verbalizes Understanding   Class Start Time 1359   Class Stop  Time 1432   Class Time Calculation (min) 33 min    Row Name 10/31/23 1400     Education   Cardiac Education Topics Pritikin   Customer Service Manager   Weekly Topic One-Pot Wonders   Instruction Review Code 1- Verbalizes Understanding   Class Start Time 1400   Class Stop Time 1440   Class Time Calculation (min) 40 min    Row Name 11/02/23 1500     Education   Cardiac Education Topics Pritikin   Writer General Education   General Education Hypertension and Heart Disease   Instruction Review Code 1- Verbalizes Understanding   Class Start Time 1403   Class Stop Time 1440   Class Time Calculation (min) 37 min    Row Name 11/05/23 1500     Education   Cardiac Education Topics Pritikin   Geographical Information Systems Officer Psychosocial   Psychosocial Workshop Focused Goals, Sustainable Changes   Instruction Review Code 1- Verbalizes Understanding   Class Start Time 1359   Class Stop Time 1434   Class Time Calculation (min) 35 min    Row Name 11/07/23 1400     Education   Cardiac Education Topics Pritikin   Customer Service Manager   Weekly Topic Comforting Weekend Breakfasts   Instruction Review Code 1- Verbalizes Understanding   Class Start Time 1358   Class Stop Time 1439   Class Time Calculation (min) 41 min    Row Name 11/09/23 1300     Education   Cardiac Education Topics Pritikin   Nurse, Children's Exercise Physiologist   Select Nutrition   Nutrition Dining Out - Part 1   Instruction Review Code 1- Verbalizes Understanding   Class Start Time 1400   Class Stop Time 1440   Class Time Calculation (min) 40 min    Row Name 11/12/23 1300     Education   Cardiac Education Topics Pritikin   Chief Of Staff Exercise Education   Exercise Education Biomechanial Limitations   Instruction Review Code 1- Verbalizes Understanding   Class Start Time 1405   Class Stop Time 1440   Class Time Calculation (min) 35 min    Row Name 11/14/23 1300     Education   Cardiac Education Topics Pritikin   Designer, Jewellery   Weekly Topic Fast Evening Meals   Instruction Review Code 1- Verbalizes Understanding   Class Start Time 1356  Class Stop Time 1428   Class Time Calculation (min) 32 min    Row Name 11/16/23 1400     Education   Cardiac Education Topics Pritikin   Select Core Videos     Core Videos   Educator Dietitian   Select Nutrition   Nutrition Vitamins and Minerals   Instruction Review Code 1- Verbalizes Understanding   Class Start Time 1357   Class Stop Time 1440   Class Time Calculation (min) 43 min    Row Name 11/19/23 1300     Education   Cardiac Education Topics Pritikin   Psychologist, Forensic Exercise Education   Exercise Education Improving Performance   Instruction Review Code 1- Verbalizes Understanding   Class Start Time 1400   Class Stop Time 1438   Class Time Calculation (min) 38 min    Row Name 11/23/23 1400     Education   Cardiac Education Topics Pritikin   Glass Blower/designer Nutrition   Nutrition Workshop Fueling a Forensic Psychologist   Instruction Review Code 1- Tax Inspector   Class Start Time 1357   Class Stop Time 1433   Class Time Calculation (min) 36 min    Row Name 11/28/23 1400     Education   Cardiac Education Topics Pritikin   Set Designer Dietitian;Respiratory Therapist   Weekly Topic Simple Sides and Sauces   Instruction Review Code 1- Verbalizes Understanding   Class Start Time 1400   Class Stop Time  1435   Class Time Calculation (min) 35 min    Row Name 11/30/23 1300     Education   Cardiac Education Topics Pritikin   Nurse, Children's Exercise Physiologist   Select Psychosocial   Psychosocial How Our Thoughts Can Heal Our Hearts   Instruction Review Code 1- Verbalizes Understanding   Class Start Time 1400   Class Stop Time 1436   Class Time Calculation (min) 36 min    Row Name 12/03/23 1300     Education   Cardiac Education Topics Pritikin   Hospital Doctor Education   General Education Heart Disease Risk Reduction   Instruction Review Code 1- Verbalizes Understanding   Class Start Time 1410   Class Stop Time 1450   Class Time Calculation (min) 40 min    Row Name 12/05/23 1300     Education   Cardiac Education Topics Pritikin   Customer Service Manager   Weekly Topic Powerhouse Plant-Based Proteins   Instruction Review Code 1- Verbalizes Understanding   Class Start Time 1400   Class Stop Time 1436   Class Time Calculation (min) 36 min    Row Name 12/07/23 1400     Education   Cardiac Education Topics Pritikin   Select Core Videos     Core Videos   Educator Dietitian   Select Nutrition   Nutrition Facts on Fat   Instruction Review Code 1- Verbalizes Understanding   Class Start Time 1357   Class Stop Time 1436   Class Time Calculation (min) 39 min    Row Name 12/10/23 1500     Education  Cardiac Education Topics Pritikin   Geographical Information Systems Officer Psychosocial   Psychosocial Workshop From Head to Heart: The Power of a Healthy Outlook   Instruction Review Code 1- Verbalizes Understanding   Class Start Time 1400   Class Stop Time 1446   Class Time Calculation (min) 46 min    Row Name 12/12/23 1400     Education   Cardiac Education Topics Pritikin   Research Scientist (life Sciences)   Weekly Topic Tasty Appetizers and Snacks   Instruction Review Code 1- Verbalizes Understanding   Class Start Time 1400   Class Stop Time 1442   Class Time Calculation (min) 42 min    Row Name 12/14/23 1300     Education   Cardiac Education Topics Pritikin   Select Workshops     Workshops   Educator Exercise Physiologist   Select Exercise   Exercise Workshop Managing Heart Disease: Your Path to a Healthier Heart   Instruction Review Code 1- Verbalizes Understanding   Class Start Time 1405   Class Stop Time 1453   Class Time Calculation (min) 48 min    Row Name 12/17/23 1300     Education   Cardiac Education Topics Pritikin   Writer Psychosocial   Psychosocial Healthy Minds, Bodies, Hearts   Instruction Review Code 1- Verbalizes Understanding   Class Start Time 1400   Class Stop Time 1434   Class Time Calculation (min) 34 min    Row Name 12/19/23 1300     Education   Cardiac Education Topics Pritikin   Secondary School Teacher School   Educator Nurse;Respiratory Therapist   Weekly Topic Adding Flavor - Sodium-Free   Instruction Review Code 1- Verbalizes Understanding   Class Start Time 1357   Class Stop Time 1432   Class Time Calculation (min) 35 min    Row Name 12/21/23 1300     Education   Cardiac Education Topics Pritikin   Glass Blower/designer Nutrition   Nutrition Workshop Label Reading   Instruction Review Code 1- Tax Inspector   Class Start Time 1400   Class Stop Time 1448   Class Time Calculation (min) 48 min    Row Name 12/24/23 1400     Education   Cardiac Education Topics Pritikin   Geographical Information Systems Officer Exercise   Exercise Workshop Location Manager and Fall Prevention   Instruction Review Code 1- Verbalizes  Understanding   Class Start Time 1408   Class Stop Time 1500   Class Time Calculation (min) 52 min    Row Name 12/26/23 1400     Education   Cardiac Education Topics Pritikin   Customer Service Manager   Weekly Topic Fast and Healthy Breakfasts   Instruction Review Code 1- Verbalizes Understanding   Class Start Time 1400   Class Stop Time 1442   Class Time Calculation (min) 42 min    Row Name 12/28/23 1400     Education   Cardiac Education Topics Pritikin   Psychologist, Counselling  Select Nutrition   Nutrition Other  Label Reading   Instruction Review Code 1- Verbalizes Understanding   Class Start Time 1358   Class Stop Time 1456   Class Time Calculation (min) 58 min    Row Name 12/31/23 1400     Education   Cardiac Education Topics Pritikin   Hospital Doctor Education   General Education Metabolic Syndrome and Belly Fat   Instruction Review Code 1- Verbalizes Understanding   Class Start Time 1407   Class Stop Time 1445   Class Time Calculation (min) 38 min      Core Videos: Exercise    Move It!  Clinical staff conducted group or individual video education with verbal and written material and guidebook.  Patient learns the recommended Pritikin exercise program. Exercise with the goal of living a long, healthy life. Some of the health benefits of exercise include controlled diabetes, healthier blood pressure levels, improved cholesterol levels, improved heart and lung capacity, improved sleep, and better body composition. Everyone should speak with their doctor before starting or changing an exercise routine.  Biomechanical Limitations Clinical staff conducted group or individual video education with verbal and written material and guidebook.  Patient learns how biomechanical limitations can impact exercise and how we can  mitigate and possibly overcome limitations to have an impactful and balanced exercise routine.  Body Composition Clinical staff conducted group or individual video education with verbal and written material and guidebook.  Patient learns that body composition (ratio of muscle mass to fat mass) is a key component to assessing overall fitness, rather than body weight alone. Increased fat mass, especially visceral belly fat, can put us  at increased risk for metabolic syndrome, type 2 diabetes, heart disease, and even death. It is recommended to combine diet and exercise (cardiovascular and resistance training) to improve your body composition. Seek guidance from your physician and exercise physiologist before implementing an exercise routine.  Exercise Action Plan Clinical staff conducted group or individual video education with verbal and written material and guidebook.  Patient learns the recommended strategies to achieve and enjoy long-term exercise adherence, including variety, self-motivation, self-efficacy, and positive decision making. Benefits of exercise include fitness, good health, weight management, more energy, better sleep, less stress, and overall well-being.  Medical   Heart Disease Risk Reduction Clinical staff conducted group or individual video education with verbal and written material and guidebook.  Patient learns our heart is our most vital organ as it circulates oxygen , nutrients, white blood cells, and hormones throughout the entire body, and carries waste away. Data supports a plant-based eating plan like the Pritikin Program for its effectiveness in slowing progression of and reversing heart disease. The video provides a number of recommendations to address heart disease.   Metabolic Syndrome and Belly Fat  Clinical staff conducted group or individual video education with verbal and written material and guidebook.  Patient learns what metabolic syndrome is, how it leads to  heart disease, and how one can reverse it and keep it from coming back. You have metabolic syndrome if you have 3 of the following 5 criteria: abdominal obesity, high blood pressure, high triglycerides, low HDL cholesterol, and high blood sugar.  Hypertension and Heart Disease Clinical staff conducted group or individual video education with verbal and written material and guidebook.  Patient learns that high blood pressure, or hypertension, is very common in the [place] . Hypertension is largely  due to excessive salt intake, but other important risk factors include being overweight, physical inactivity, drinking too much alcohol, smoking, and not eating enough potassium from fruits and vegetables. High blood pressure is a leading risk factor for heart attack, stroke, congestive heart failure, dementia, kidney failure, and premature death. Long-term effects of excessive salt intake include stiffening of the arteries and thickening of heart muscle and organ damage. Recommendations include ways to reduce hypertension and the risk of heart disease.  Diseases of Our Time - Focusing on Diabetes Clinical staff conducted group or individual video education with verbal and written material and guidebook.  Patient learns why the best way to stop diseases of our time is prevention, through food and other lifestyle changes. Medicine (such as prescription pills and surgeries) is often only a Band-Aid on the problem, not a long-term solution. Most common diseases of our time include obesity, type 2 diabetes, hypertension, heart disease, and cancer. The Pritikin Program is recommended and has been proven to help reduce, reverse, and/or prevent the damaging effects of metabolic syndrome.  Nutrition   Overview of the Pritikin Eating Plan  Clinical staff conducted group or individual video education with verbal and written material and guidebook.  Patient learns about the Pritikin Eating Plan for disease risk  reduction. The Pritikin Eating Plan emphasizes a wide variety of unrefined, minimally-processed carbohydrates, like fruits, vegetables, whole grains, and legumes. Go, Caution, and Stop food choices are explained. Plant-based and lean animal proteins are emphasized. Rationale provided for low sodium intake for blood pressure control, low added sugars for blood sugar stabilization, and low added fats and oils for coronary artery disease risk reduction and weight management.  Calorie Density  Clinical staff conducted group or individual video education with verbal and written material and guidebook.  Patient learns about calorie density and how it impacts the Pritikin Eating Plan. Knowing the characteristics of the food you choose will help you decide whether those foods will lead to weight gain or weight loss, and whether you want to consume more or less of them. Weight loss is usually a side effect of the Pritikin Eating Plan because of its focus on low calorie-dense foods.  Label Reading  Clinical staff conducted group or individual video education with verbal and written material and guidebook.  Patient learns about the Pritikin recommended label reading guidelines and corresponding recommendations regarding calorie density, added sugars, sodium content, and whole grains.  Dining Out - Part 1  Clinical staff conducted group or individual video education with verbal and written material and guidebook.  Patient learns that restaurant meals can be sabotaging because they can be so high in calories, fat, sodium, and/or sugar. Patient learns recommended strategies on how to positively address this and avoid unhealthy pitfalls.  Facts on Fats  Clinical staff conducted group or individual video education with verbal and written material and guidebook.  Patient learns that lifestyle modifications can be just as effective, if not more so, as many medications for lowering your risk of heart disease. A  Pritikin lifestyle can help to reduce your risk of inflammation and atherosclerosis (cholesterol build-up, or plaque, in the artery walls). Lifestyle interventions such as dietary choices and physical activity address the cause of atherosclerosis. A review of the types of fats and their impact on blood cholesterol levels, along with dietary recommendations to reduce fat intake is also included.  Nutrition Action Plan  Clinical staff conducted group or individual video education with verbal and written material and guidebook.  Patient learns how to incorporate Pritikin recommendations into their lifestyle. Recommendations include planning and keeping personal health goals in mind as an important part of their success.  Healthy Mind-Set    Healthy Minds, Bodies, Hearts  Clinical staff conducted group or individual video education with verbal and written material and guidebook.  Patient learns how to identify when they are stressed. Video will discuss the impact of that stress, as well as the many benefits of stress management. Patient will also be introduced to stress management techniques. The way we think, act, and feel has an impact on our hearts.  How Our Thoughts Can Heal Our Hearts  Clinical staff conducted group or individual video education with verbal and written material and guidebook.  Patient learns that negative thoughts can cause depression and anxiety. This can result in negative lifestyle behavior and serious health problems. Cognitive behavioral therapy is an effective method to help control our thoughts in order to change and improve our emotional outlook.  Additional Videos:  Exercise    Improving Performance  Clinical staff conducted group or individual video education with verbal and written material and guidebook.  Patient learns to use a non-linear approach by alternating intensity levels and lengths of time spent exercising to help burn more calories and lose more body fat.  Cardiovascular exercise helps improve heart health, metabolism, hormonal balance, blood sugar control, and recovery from fatigue. Resistance training improves strength, endurance, balance, coordination, reaction time, metabolism, and muscle mass. Flexibility exercise improves circulation, posture, and balance. Seek guidance from your physician and exercise physiologist before implementing an exercise routine and learn your capabilities and proper form for all exercise.  Introduction to Yoga  Clinical staff conducted group or individual video education with verbal and written material and guidebook.  Patient learns about yoga, a discipline of the coming together of mind, breath, and body. The benefits of yoga include improved flexibility, improved range of motion, better posture and core strength, increased lung function, weight loss, and positive self-image. Yoga's heart health benefits include lowered blood pressure, healthier heart rate, decreased cholesterol and triglyceride levels, improved immune function, and reduced stress. Seek guidance from your physician and exercise physiologist before implementing an exercise routine and learn your capabilities and proper form for all exercise.  Medical   Aging: Enhancing Your Quality of Life  Clinical staff conducted group or individual video education with verbal and written material and guidebook.  Patient learns key strategies and recommendations to stay in good physical health and enhance quality of life, such as prevention strategies, having an advocate, securing a Health Care Proxy and Power of Attorney, and keeping a list of medications and system for tracking them. It also discusses how to avoid risk for bone loss.  Biology of Weight Control  Clinical staff conducted group or individual video education with verbal and written material and guidebook.  Patient learns that weight gain occurs because we consume more calories than we burn (eating more,  moving less). Even if your body weight is normal, you may have higher ratios of fat compared to muscle mass. Too much body fat puts you at increased risk for cardiovascular disease, heart attack, stroke, type 2 diabetes, and obesity-related cancers. In addition to exercise, following the Pritikin Eating Plan can help reduce your risk.  Decoding Lab Results  Clinical staff conducted group or individual video education with verbal and written material and guidebook.  Patient learns that lab test reflects one measurement whose values change over time and are influenced  by many factors, including medication, stress, sleep, exercise, food, hydration, pre-existing medical conditions, and more. It is recommended to use the knowledge from this video to become more involved with your lab results and evaluate your numbers to speak with your doctor.   Diseases of Our Time - Overview  Clinical staff conducted group or individual video education with verbal and written material and guidebook.  Patient learns that according to the CDC, 50% to 70% of chronic diseases (such as obesity, type 2 diabetes, elevated lipids, hypertension, and heart disease) are avoidable through lifestyle improvements including healthier food choices, listening to satiety cues, and increased physical activity.  Sleep Disorders Clinical staff conducted group or individual video education with verbal and written material and guidebook.  Patient learns how good quality and duration of sleep are important to overall health and well-being. Patient also learns about sleep disorders and how they impact health along with recommendations to address them, including discussing with a physician.  Nutrition  Dining Out - Part 2 Clinical staff conducted group or individual video education with verbal and written material and guidebook.  Patient learns how to plan ahead and communicate in order to maximize their dining experience in a healthy and  nutritious manner. Included are recommended food choices based on the type of restaurant the patient is visiting.   Fueling a Banker conducted group or individual video education with verbal and written material and guidebook.  There is a strong connection between our food choices and our health. Diseases like obesity and type 2 diabetes are very prevalent and are in large-part due to lifestyle choices. The Pritikin Eating Plan provides plenty of food and hunger-curbing satisfaction. It is easy to follow, affordable, and helps reduce health risks.  Menu Workshop  Clinical staff conducted group or individual video education with verbal and written material and guidebook.  Patient learns that restaurant meals can sabotage health goals because they are often packed with calories, fat, sodium, and sugar. Recommendations include strategies to plan ahead and to communicate with the manager, chef, or server to help order a healthier meal.  Planning Your Eating Strategy  Clinical staff conducted group or individual video education with verbal and written material and guidebook.  Patient learns about the Pritikin Eating Plan and its benefit of reducing the risk of disease. The Pritikin Eating Plan does not focus on calories. Instead, it emphasizes high-quality, nutrient-rich foods. By knowing the characteristics of the foods, we choose, we can determine their calorie density and make informed decisions.  Targeting Your Nutrition Priorities  Clinical staff conducted group or individual video education with verbal and written material and guidebook.  Patient learns that lifestyle habits have a tremendous impact on disease risk and progression. This video provides eating and physical activity recommendations based on your personal health goals, such as reducing LDL cholesterol, losing weight, preventing or controlling type 2 diabetes, and reducing high blood pressure.  Vitamins and  Minerals  Clinical staff conducted group or individual video education with verbal and written material and guidebook.  Patient learns different ways to obtain key vitamins and minerals, including through a recommended healthy diet. It is important to discuss all supplements you take with your doctor.   Healthy Mind-Set    Smoking Cessation  Clinical staff conducted group or individual video education with verbal and written material and guidebook.  Patient learns that cigarette smoking and tobacco addiction pose a serious health risk which affects millions of people. Stopping smoking will  significantly reduce the risk of heart disease, lung disease, and many forms of cancer. Recommended strategies for quitting are covered, including working with your doctor to develop a successful plan.  Culinary   Becoming a Set Designer conducted group or individual video education with verbal and written material and guidebook.  Patient learns that cooking at home can be healthy, cost-effective, quick, and puts them in control. Keys to cooking healthy recipes will include looking at your recipe, assessing your equipment needs, planning ahead, making it simple, choosing cost-effective seasonal ingredients, and limiting the use of added fats, salts, and sugars.  Cooking - Breakfast and Snacks  Clinical staff conducted group or individual video education with verbal and written material and guidebook.  Patient learns how important breakfast is to satiety and nutrition through the entire day. Recommendations include key foods to eat during breakfast to help stabilize blood sugar levels and to prevent overeating at meals later in the day. Planning ahead is also a key component.  Cooking - Educational Psychologist conducted group or individual video education with verbal and written material and guidebook.  Patient learns eating strategies to improve overall health, including an approach  to cook more at home. Recommendations include thinking of animal protein as a side on your plate rather than center stage and focusing instead on lower calorie dense options like vegetables, fruits, whole grains, and plant-based proteins, such as beans. Making sauces in large quantities to freeze for later and leaving the skin on your vegetables are also recommended to maximize your experience.  Cooking - Healthy Salads and Dressing Clinical staff conducted group or individual video education with verbal and written material and guidebook.  Patient learns that vegetables, fruits, whole grains, and legumes are the foundations of the Pritikin Eating Plan. Recommendations include how to incorporate each of these in flavorful and healthy salads, and how to create homemade salad dressings. Proper handling of ingredients is also covered. Cooking - Soups and [employer] - Soups and Desserts Clinical staff conducted group or individual video education with verbal and written material and guidebook.  Patient learns that Pritikin soups and desserts make for easy, nutritious, and delicious snacks and meal components that are low in sodium, fat, sugar, and calorie density, while high in vitamins, minerals, and filling fiber. Recommendations include simple and healthy ideas for soups and desserts.   Overview     The Pritikin Solution Program Overview Clinical staff conducted group or individual video education with verbal and written material and guidebook.  Patient learns that the results of the Pritikin Program have been documented in more than 100 articles published in peer-reviewed journals, and the benefits include reducing risk factors for (and, in some cases, even reversing) high cholesterol, high blood pressure, type 2 diabetes, obesity, and more! An overview of the three key pillars of the Pritikin Program will be covered: eating well, doing regular exercise, and having a healthy  mind-set.  WORKSHOPS  Exercise: Exercise Basics: Building Your Action Plan Clinical staff led group instruction and group discussion with PowerPoint presentation and patient guidebook. To enhance the learning environment the use of posters, models and videos may be added. At the conclusion of this workshop, patients will comprehend the difference between physical activity and exercise, as well as the benefits of incorporating both, into their routine. Patients will understand the FITT (Frequency, Intensity, Time, and Type) principle and how to use it to build an exercise action plan. In addition, safety  concerns and other considerations for exercise and cardiac rehab will be addressed by the presenter. The purpose of this lesson is to promote a comprehensive and effective weekly exercise routine in order to improve patients' overall level of fitness.   Managing Heart Disease: Your Path to a Healthier Heart Clinical staff led group instruction and group discussion with PowerPoint presentation and patient guidebook. To enhance the learning environment the use of posters, models and videos may be added.At the conclusion of this workshop, patients will understand the anatomy and physiology of the heart. Additionally, they will understand how Pritikin's three pillars impact the risk factors, the progression, and the management of heart disease.  The purpose of this lesson is to provide a high-level overview of the heart, heart disease, and how the Pritikin lifestyle positively impacts risk factors.  Exercise Biomechanics Clinical staff led group instruction and group discussion with PowerPoint presentation and patient guidebook. To enhance the learning environment the use of posters, models and videos may be added. Patients will learn how the structural parts of their bodies function and how these functions impact their daily activities, movement, and exercise. Patients will learn how to promote a  neutral spine, learn how to manage pain, and identify ways to improve their physical movement in order to promote healthy living. The purpose of this lesson is to expose patients to common physical limitations that impact physical activity. Participants will learn practical ways to adapt and manage aches and pains, and to minimize their effect on regular exercise. Patients will learn how to maintain good posture while sitting, walking, and lifting.  Balance Training and Fall Prevention  Clinical staff led group instruction and group discussion with PowerPoint presentation and patient guidebook. To enhance the learning environment the use of posters, models and videos may be added. At the conclusion of this workshop, patients will understand the importance of their sensorimotor skills (vision, proprioception, and the vestibular system) in maintaining their ability to balance as they age. Patients will apply a variety of balancing exercises that are appropriate for their current level of function. Patients will understand the common causes for poor balance, possible solutions to these problems, and ways to modify their physical environment in order to minimize their fall risk. The purpose of this lesson is to teach patients about the importance of maintaining balance as they age and ways to minimize their risk of falling.  WORKSHOPS   Nutrition:  Fueling a Ship Broker led group instruction and group discussion with PowerPoint presentation and patient guidebook. To enhance the learning environment the use of posters, models and videos may be added. Patients will review the foundational principles of the Pritikin Eating Plan and understand what constitutes a serving size in each of the food groups. Patients will also learn Pritikin-friendly foods that are better choices when away from home and review make-ahead meal and snack options. Calorie density will be reviewed and applied to  three nutrition priorities: weight maintenance, weight loss, and weight gain. The purpose of this lesson is to reinforce (in a group setting) the key concepts around what patients are recommended to eat and how to apply these guidelines when away from home by planning and selecting Pritikin-friendly options. Patients will understand how calorie density may be adjusted for different weight management goals.  Mindful Eating  Clinical staff led group instruction and group discussion with PowerPoint presentation and patient guidebook. To enhance the learning environment the use of posters, models and videos may be added. Patients will  briefly review the concepts of the Pritikin Eating Plan and the importance of low-calorie dense foods. The concept of mindful eating will be introduced as well as the importance of paying attention to internal hunger signals. Triggers for non-hunger eating and techniques for dealing with triggers will be explored. The purpose of this lesson is to provide patients with the opportunity to review the basic principles of the Pritikin Eating Plan, discuss the value of eating mindfully and how to measure internal cues of hunger and fullness using the Hunger Scale. Patients will also discuss reasons for non-hunger eating and learn strategies to use for controlling emotional eating.  Targeting Your Nutrition Priorities Clinical staff led group instruction and group discussion with PowerPoint presentation and patient guidebook. To enhance the learning environment the use of posters, models and videos may be added. Patients will learn how to determine their genetic susceptibility to disease by reviewing their family history. Patients will gain insight into the importance of diet as part of an overall healthy lifestyle in mitigating the impact of genetics and other environmental insults. The purpose of this lesson is to provide patients with the opportunity to assess their personal nutrition  priorities by looking at their family history, their own health history and current risk factors. Patients will also be able to discuss ways of prioritizing and modifying the Pritikin Eating Plan for their highest risk areas  Menu  Clinical staff led group instruction and group discussion with PowerPoint presentation and patient guidebook. To enhance the learning environment the use of posters, models and videos may be added. Using menus brought in from [employer], or printed from [employer], patients will apply the Pritikin dining out guidelines that were presented in the [employer] video. Patients will also be able to practice these guidelines in a variety of provided scenarios. The purpose of this lesson is to provide patients with the opportunity to practice hands-on learning of the Pritikin Dining Out guidelines with actual menus and practice scenarios.  Label Reading Clinical staff led group instruction and group discussion with PowerPoint presentation and patient guidebook. To enhance the learning environment the use of posters, models and videos may be added. Patients will review and discuss the Pritikin label reading guidelines presented in Pritikin's Label Reading Educational series video. Using fool labels brought in from local grocery stores and markets, patients will apply the label reading guidelines and determine if the packaged food meet the Pritikin guidelines. The purpose of this lesson is to provide patients with the opportunity to review, discuss, and practice hands-on learning of the Pritikin Label Reading guidelines with actual packaged food labels. Cooking School  Pritikin's [employer] are designed to teach patients ways to prepare quick, simple, and affordable recipes at home. The importance of nutrition's role in chronic disease risk reduction is reflected in its emphasis in the overall Pritikin program. By learning how to prepare essential  core Pritikin Eating Plan recipes, patients will increase control over what they eat; be able to customize the flavor of foods without the use of added salt, sugar, or fat; and improve the quality of the food they consume. By learning a set of core recipes which are easily assembled, quickly prepared, and affordable, patients are more likely to prepare more healthy foods at home. These workshops focus on convenient breakfasts, simple entres, side dishes, and desserts which can be prepared with minimal effort and are consistent with nutrition recommendations for cardiovascular risk reduction. Cooking [employer] are taught by  a armed forces logistics/support/administrative officer (RD) who has been trained by the [employer] team. The chef or RD has a clear understanding of the importance of minimizing - if not completely eliminating - added fat, sugar, and sodium in recipes. Throughout the series of Cooking School Workshop sessions, patients will learn about healthy ingredients and efficient methods of cooking to build confidence in their capability to prepare    Cooking School weekly topics:  Adding Flavor- Sodium-Free  Fast and Healthy Breakfasts  Powerhouse Plant-Based Proteins  Satisfying Salads and Dressings  Simple Sides and Sauces  International Cuisine-Spotlight on the [employer] Zones  Delicious Desserts  Savory Soups  [employer] - Meals in a Astronomer Appetizers and Snacks  Comforting Weekend Breakfasts  One-Pot Wonders   Fast Evening Meals  Landscape Architect Your Pritikin Plate  WORKSHOPS   Healthy Mindset (Psychosocial):  Focused Goals, Sustainable Changes Clinical staff led group instruction and group discussion with PowerPoint presentation and patient guidebook. To enhance the learning environment the use of posters, models and videos may be added. Patients will be able to apply effective goal setting strategies to establish at least one personal goal, and then take  consistent, meaningful action toward that goal. They will learn to identify common barriers to achieving personal goals and develop strategies to overcome them. Patients will also gain an understanding of how our mind-set can impact our ability to achieve goals and the importance of cultivating a positive and growth-oriented mind-set. The purpose of this lesson is to provide patients with a deeper understanding of how to set and achieve personal goals, as well as the tools and strategies needed to overcome common obstacles which may arise along the way.  From Head to Heart: The Power of a Healthy Outlook  Clinical staff led group instruction and group discussion with PowerPoint presentation and patient guidebook. To enhance the learning environment the use of posters, models and videos may be added. Patients will be able to recognize and describe the impact of emotions and mood on physical health. They will discover the importance of self-care and explore self-care practices which may work for them. Patients will also learn how to utilize the 4 C's to cultivate a healthier outlook and better manage stress and challenges. The purpose of this lesson is to demonstrate to patients how a healthy outlook is an essential part of maintaining good health, especially as they continue their cardiac rehab journey.  Healthy Sleep for a Healthy Heart Clinical staff led group instruction and group discussion with PowerPoint presentation and patient guidebook. To enhance the learning environment the use of posters, models and videos may be added. At the conclusion of this workshop, patients will be able to demonstrate knowledge of the importance of sleep to overall health, well-being, and quality of life. They will understand the symptoms of, and treatments for, common sleep disorders. Patients will also be able to identify daytime and nighttime behaviors which impact sleep, and they will be able to apply these tools to help  manage sleep-related challenges. The purpose of this lesson is to provide patients with a general overview of sleep and outline the importance of quality sleep. Patients will learn about a few of the most common sleep disorders. Patients will also be introduced to the concept of "sleep hygiene," and discover ways to self-manage certain sleeping problems through simple daily behavior changes. Finally, the workshop will motivate patients by clarifying the links between quality sleep and their goals of heart-healthy  living.   Recognizing and Reducing Stress Clinical staff led group instruction and group discussion with PowerPoint presentation and patient guidebook. To enhance the learning environment the use of posters, models and videos may be added. At the conclusion of this workshop, patients will be able to understand the types of stress reactions, differentiate between acute and chronic stress, and recognize the impact that chronic stress has on their health. They will also be able to apply different coping mechanisms, such as reframing negative self-talk. Patients will have the opportunity to practice a variety of stress management techniques, such as deep abdominal breathing, progressive muscle relaxation, and/or guided imagery.  The purpose of this lesson is to educate patients on the role of stress in their lives and to provide healthy techniques for coping with it.  Learning Barriers/Preferences:  Learning Barriers/Preferences - 10/11/23 1354       Learning Barriers/Preferences   Learning Barriers Sight;Hearing    Learning Preferences Video;Pictoral;Computer/Internet          Education Topics:  Knowledge Questionnaire Score:  Knowledge Questionnaire Score - 12/31/23 1719       Knowledge Questionnaire Score   Post Score 18/24          Core Components/Risk Factors/Patient Goals at Admission:  Personal Goals and Risk Factors at Admission - 10/11/23 1402       Core  Components/Risk Factors/Patient Goals on Admission    Weight Management Weight Loss    Diabetes Yes    Intervention Provide education about signs/symptoms and action to take for hypo/hyperglycemia.;Provide education about proper nutrition, including hydration, and aerobic/resistive exercise prescription along with prescribed medications to achieve blood glucose in normal ranges: Fasting glucose 65-99 mg/dL    Expected Outcomes Short Term: Participant verbalizes understanding of the signs/symptoms and immediate care of hyper/hypoglycemia, proper foot care and importance of medication, aerobic/resistive exercise and nutrition plan for blood glucose control.;Long Term: Attainment of HbA1C < 7%.    Hypertension Yes    Intervention Provide education on lifestyle modifcations including regular physical activity/exercise, weight management, moderate sodium restriction and increased consumption of fresh fruit, vegetables, and low fat dairy, alcohol moderation, and smoking cessation.;Monitor prescription use compliance.    Expected Outcomes Short Term: Continued assessment and intervention until BP is < 140/71mm HG in hypertensive participants. < 130/71mm HG in hypertensive participants with diabetes, heart failure or chronic kidney disease.;Long Term: Maintenance of blood pressure at goal levels.    Lipids Yes    Intervention Provide education and support for participant on nutrition & aerobic/resistive exercise along with prescribed medications to achieve LDL 70mg , HDL >40mg .    Expected Outcomes Short Term: Participant states understanding of desired cholesterol values and is compliant with medications prescribed. Participant is following exercise prescription and nutrition guidelines.;Long Term: Cholesterol controlled with medications as prescribed, with individualized exercise RX and with personalized nutrition plan. Value goals: LDL < 70mg , HDL > 40 mg.    Stress Yes    Intervention Offer individual and/or  small group education and counseling on adjustment to heart disease, stress management and health-related lifestyle change. Teach and support self-help strategies.;Refer participants experiencing significant psychosocial distress to appropriate mental health specialists for further evaluation and treatment. When possible, include family members and significant others in education/counseling sessions.    Expected Outcomes Short Term: Participant demonstrates changes in health-related behavior, relaxation and other stress management skills, ability to obtain effective social support, and compliance with psychotropic medications if prescribed.;Long Term: Emotional wellbeing is indicated by absence of clinically significant psychosocial  distress or social isolation.          Core Components/Risk Factors/Patient Goals Review:   Goals and Risk Factor Review     Row Name 11/06/23 1655 12/05/23 1427 01/01/24 1045         Core Components/Risk Factors/Patient Goals Review   Personal Goals Review Weight Management/Obesity;Lipids;Stress;Diabetes;Hypertension Weight Management/Obesity;Lipids;Stress;Diabetes;Hypertension Weight Management/Obesity;Lipids;Stress;Diabetes;Hypertension     Review Dick started cardiac rehab on 10/15/23. Dick is off to a good start to exercise. Vital signs and CBG's have been stable. Kurt is doing well exercise. Vital signs and CBG's have been stable. Rich has lost 1 kg since starting cardiac rehab Dick continues to do  well exercise. Vital signs and CBG's have been stable. Rich has lost 2.1  kg since starting cardiac rehab     Expected Outcomes Dick will continue to participate in cardiac rehab for exercise, nutrition and lifestyle modifications. Dick will continue to participate in cardiac rehab for exercise, nutrition and lifestyle modifications. Dick will continue to participate in cardiac rehab for exercise, nutrition and lifestyle modifications.        Core Components/Risk  Factors/Patient Goals at Discharge (Final Review):   Goals and Risk Factor Review - 01/01/24 1045       Core Components/Risk Factors/Patient Goals Review   Personal Goals Review Weight Management/Obesity;Lipids;Stress;Diabetes;Hypertension    Review Dick continues to do  well exercise. Vital signs and CBG's have been stable. Rich has lost 2.1  kg since starting cardiac rehab    Expected Outcomes Dick will continue to participate in cardiac rehab for exercise, nutrition and lifestyle modifications.          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 Introduction to [facility] cardiac rehab. Initial orientation packet reviewed with patient 30 Day ITP Review. Legion started cardiac rehab on 10/15/23. Uchenna is off to a good start to exercise. 30 Day ITP Review. Heber has good attendance and participation with exercise at cardiac rehab 30 Day ITP Review. Lexus has good attendance and participation with exercise at cardiac rehab. Dick will tenatively complete cardiac rehab on 01/02/24       Comments: See ITP comments.Hadassah Elpidio Quan RN BSN

## 2024-01-02 ENCOUNTER — Encounter (HOSPITAL_COMMUNITY)
Admission: RE | Admit: 2024-01-02 | Discharge: 2024-01-02 | Disposition: A | Discharge status: Home / Self Care | Source: Ambulatory Visit | Attending: Cardiovascular Disease | Admitting: Cardiovascular Disease

## 2024-01-02 DIAGNOSIS — Z951 Presence of aortocoronary bypass graft: Secondary | ICD-10-CM | POA: Diagnosis not present

## 2024-01-04 ENCOUNTER — Encounter (HOSPITAL_COMMUNITY)
Admission: RE | Admit: 2024-01-04 | Discharge: 2024-01-04 | Disposition: A | Discharge status: Home / Self Care | Source: Ambulatory Visit | Attending: Cardiovascular Disease | Admitting: Cardiovascular Disease

## 2024-01-04 VITALS — Ht 73.75 in | Wt 240.3 lb

## 2024-01-04 DIAGNOSIS — Z951 Presence of aortocoronary bypass graft: Secondary | ICD-10-CM | POA: Diagnosis not present

## 2024-01-04 NOTE — Progress Notes (Signed)
 Discharge Progress Report  Patient Details  Name: Tobi Leinweber. MRN: [id number] Date of Birth: 08/17/1950 Referring Provider:   Flowsheet Row INTENSIVE CARDIAC REHAB ORIENT from 10/11/2023 in [facility]  Referring Provider Ozell Fell, MD     Number of Visits: 69  Reason for Discharge:  Patient reached a stable level of exercise. Patient independent in their exercise. Patient has met program and personal goals.  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 - Plan: Amb Referral To Provider Referral Exercise Program (P.R.E.P)  ADL [facility]:   Initial Exercise Prescription:  Initial Exercise Prescription - 10/11/23 1300       Date of Initial Exercise RX and Referring Provider   Date 10/11/23    Referring Provider Ozell Fell, MD    Expected Discharge Date 01/02/24      Recumbant Bike   Level 1    RPM 60    Watts 25    Minutes 15    METs 1.2      NuStep   Level 1    SPM 75    Minutes 15    METs 1.2      Prescription Details   Frequency (times per week) 3    Duration Progress to 30 minutes of continuous aerobic without signs/symptoms of physical distress      Intensity   THRR 40-80% of Max Heartrate 59-118    Ratings of Perceived Exertion 11-13    Perceived Dyspnea 0-4      Progression   Progression Continue progressive overload as per policy without signs/symptoms or physical distress.      Resistance Training   Training Prescription Yes    Weight 3 lbs    Reps 10-15          Discharge Exercise Prescription (Final Exercise Prescription Changes):  Exercise Prescription Changes - 01/04/24 1657       Response to  Exercise   Blood Pressure (Admit) 112/72    Blood Pressure (Exercise) 138/82    Blood Pressure (Exit) 150/70    Heart Rate (Admit) 63 bpm    Heart Rate (Exercise) 76 bpm    Heart Rate (Exit) 66 bpm    Rating of Perceived Exertion (Exercise) 12    Perceived Dyspnea (Exercise) 0    Comments Pt graduated the [employer] program    Duration Progress to 30 minutes of  aerobic without signs/symptoms of physical distress    Intensity THRR unchanged      Progression   Progression Continue to progress workloads to maintain intensity without signs/symptoms of physical distress.    Average METs 2.8      Resistance Training   Training Prescription Yes    Weight 6    Reps 10-15    Time 10 Minutes      NuStep   Level 4    SPM 70    Minutes 15    METs 2.2      Elliptical   Level 1    Speed 1    Minutes 10    METs 3.6      Home Exercise Plan  Plans to continue exercise at [facility] (comment)    Frequency Add 2 additional days to program exercise sessions.    Initial Home Exercises Provided 11/05/23          Functional Capacity:  6 Minute Walk     Row Name 10/11/23 1342 01/04/24 1653       6 Minute Walk   Phase Initial Discharge    Distance 720 feet 960 feet    Distance % Change -- 33.33 %    Distance Feet Change -- 240 ft    Walk Time 6 minutes 6 minutes    # of Rest Breaks 1  5:08-6:00 0    MPH 1.4 1.71    METS 1.23 1.71    RPE 14 11    Perceived Dyspnea  1 0    VO2 Peak 4.3 5.9    Symptoms No No    Resting HR 63 bpm 60 bpm    Resting BP 114/72 122/68    Resting Oxygen  Saturation  97 % --    Exercise Oxygen  Saturation  during 6 min walk 98 % --    Max Ex. HR 73 bpm 76 bpm    Max Ex. BP 122/74 122/80    2 Minute Post BP 120/74 --       Psychological, QOL, Others - Outcomes: PHQ 2/9:    01/04/2024    3:15 PM 10/11/2023   11:38 AM 10/17/2014    9:05 AM  Depression screen PHQ 2/9  Decreased Interest 0 0 0  Down, Depressed, Hopeless 0 0 0  PHQ -  2 Score 0 0 0  Altered sleeping 0 2   Tired, decreased energy 0 2   Change in appetite 0 1   Feeling bad or failure about yourself  0 1   Trouble concentrating 0 0   Moving slowly or fidgety/restless 1 0   Suicidal thoughts 0 0   PHQ-9 Score 1 6    Difficult doing work/chores Not difficult at all Not difficult at all      Data saved with a previous flowsheet row definition    Quality of Life:  Quality of Life - 12/31/23 1718       Quality of Life Scores   Health/Function Post 22.75 %    Socioeconomic Post 22.69 %    Psych/Spiritual Post 18.71 %    Family Post 16.4 %    GLOBAL Post 20.97 %          Personal Goals: Goals established at orientation with interventions provided to work toward goal.  Personal Goals and Risk Factors at Admission - 10/11/23 1402       Core Components/Risk Factors/Patient Goals on Admission    Weight Management Weight Loss    Diabetes Yes    Intervention Provide education about signs/symptoms and action to take for hypo/hyperglycemia.;Provide education about proper nutrition, including hydration, and aerobic/resistive exercise prescription along with prescribed medications to achieve blood glucose in normal ranges: Fasting glucose 65-99 mg/dL    Expected Outcomes Short Term: Participant verbalizes understanding of the signs/symptoms and immediate care of hyper/hypoglycemia, proper foot care and importance of medication, aerobic/resistive exercise and nutrition plan for blood glucose control.;Long Term: Attainment of HbA1C < 7%.    Hypertension Yes    Intervention Provide education on lifestyle modifcations including regular physical activity/exercise, weight management, moderate sodium restriction and increased consumption of fresh fruit, vegetables, and low fat dairy, alcohol moderation, and smoking cessation.;Monitor prescription use compliance.  Expected Outcomes Short Term: Continued assessment and intervention until BP is < 140/43mm HG in  hypertensive participants. < 130/27mm HG in hypertensive participants with diabetes, heart failure or chronic kidney disease.;Long Term: Maintenance of blood pressure at goal levels.    Lipids Yes    Intervention Provide education and support for participant on nutrition & aerobic/resistive exercise along with prescribed medications to achieve LDL 70mg , HDL >40mg .    Expected Outcomes Short Term: Participant states understanding of desired cholesterol values and is compliant with medications prescribed. Participant is following exercise prescription and nutrition guidelines.;Long Term: Cholesterol controlled with medications as prescribed, with individualized exercise RX and with personalized nutrition plan. Value goals: LDL < 70mg , HDL > 40 mg.    Stress Yes    Intervention Offer individual and/or small group education and counseling on adjustment to heart disease, stress management and health-related lifestyle change. Teach and support self-help strategies.;Refer participants experiencing significant psychosocial distress to appropriate mental health specialists for further evaluation and treatment. When possible, include family members and significant others in education/counseling sessions.    Expected Outcomes Short Term: Participant demonstrates changes in health-related behavior, relaxation and other stress management skills, ability to obtain effective social support, and compliance with psychotropic medications if prescribed.;Long Term: Emotional wellbeing is indicated by absence of clinically significant psychosocial distress or social isolation.           Personal Goals Discharge:  Goals and Risk Factor Review     Row Name 11/06/23 1655 12/05/23 1427 01/01/24 1045 01/14/24 0845       Core Components/Risk Factors/Patient Goals Review   Personal Goals Review Weight Management/Obesity;Lipids;Stress;Diabetes;Hypertension Weight Management/Obesity;Lipids;Stress;Diabetes;Hypertension Weight  Management/Obesity;Lipids;Stress;Diabetes;Hypertension Weight Management/Obesity;Lipids;Stress;Diabetes;Hypertension    Review Dick started cardiac rehab on 10/15/23. Dick is off to a good start to exercise. Vital signs and CBG's have been stable. Kurt is doing well exercise. Vital signs and CBG's have been stable. Rich has lost 1 kg since starting cardiac rehab Dick continues to do  well exercise. Vital signs and CBG's have been stable. Rich has lost 2.1  kg since starting cardiac rehab Dick completed exercise at cardiac rehab on 01/04/24 . Vital signs and CBG's have were stable. Rich has lost 2.1  kg since starting cardiac rehab    Expected Outcomes Dick will continue to participate in cardiac rehab for exercise, nutrition and lifestyle modifications. Dick will continue to participate in cardiac rehab for exercise, nutrition and lifestyle modifications. Dick will continue to participate in cardiac rehab for exercise, nutrition and lifestyle modifications. Dick will continue to exercise,  follow nutrition and lifestyle modifications upon completion of cardiac rehab.       Exercise Goals and Review:  Exercise Goals     Row Name 10/11/23 1110             Exercise Goals   Increase Physical Activity Yes       Intervention Provide advice, education, support and counseling about physical activity/exercise needs.;Develop an individualized exercise prescription for aerobic and resistive training based on initial evaluation findings, risk stratification, comorbidities and participant's personal goals.       Expected Outcomes Short Term: Attend rehab on a regular basis to increase amount of physical activity.;Long Term: Exercising regularly at least 3-5 days a week.;Long Term: Add in home exercise to make exercise part of routine and to increase amount of physical activity.       Increase Strength and Stamina Yes       Intervention Provide advice, education, support and counseling about  physical  activity/exercise needs.;Develop an individualized exercise prescription for aerobic and resistive training based on initial evaluation findings, risk stratification, comorbidities and participant's personal goals.       Expected Outcomes Short Term: Increase workloads from initial exercise prescription for resistance, speed, and METs.;Short Term: Perform resistance training exercises routinely during rehab and add in resistance training at home;Long Term: Improve cardiorespiratory fitness, muscular endurance and strength as measured by increased METs and functional capacity ( )       Able to understand and use rate of perceived exertion (RPE) scale Yes       Intervention Provide education and explanation on how to use RPE scale       Expected Outcomes Short Term: Able to use RPE daily in rehab to express subjective intensity level;Long Term:  Able to use RPE to guide intensity level when exercising independently       Knowledge and understanding of Target Heart Rate Range (THRR) Yes       Intervention Provide education and explanation of THRR including how the numbers were predicted and where they are located for reference       Expected Outcomes Short Term: Able to state/look up THRR;Long Term: Able to use THRR to govern intensity when exercising independently;Short Term: Able to use daily as guideline for intensity in rehab       Understanding of Exercise Prescription Yes       Intervention Provide education, explanation, and written materials on patient's individual exercise prescription       Expected Outcomes Short Term: Able to explain program exercise prescription;Long Term: Able to explain home exercise prescription to exercise independently          Exercise Goals Re-Evaluation:  Exercise Goals Re-Evaluation     Row Name 10/15/23 1642 10/31/23 1630 11/30/23 1642 01/04/24 1700       Exercise Goal Re-Evaluation   Exercise Goals Review Increase Physical Activity;Understanding of  Exercise Prescription;Increase Strength and Stamina;Knowledge and understanding of Target Heart Rate Range (THRR);Able to understand and use rate of perceived exertion (RPE) scale Increase Physical Activity;Understanding of Exercise Prescription;Increase Strength and Stamina;Knowledge and understanding of Target Heart Rate Range (THRR);Able to understand and use rate of perceived exertion (RPE) scale Increase Physical Activity;Understanding of Exercise Prescription;Increase Strength and Stamina;Knowledge and understanding of Target Heart Rate Range (THRR);Able to understand and use rate of perceived exertion (RPE) scale Increase Physical Activity;Understanding of Exercise Prescription;Increase Strength and Stamina;Knowledge and understanding of Target Heart Rate Range (THRR);Able to understand and use rate of perceived exertion (RPE) scale    Comments Pt first day in the [facility] program. Pt tolerated exercise well with some mild dizziness post exercise that quickly resolved. Average MET's today 1.55. He is off to a good start and is learning his THRR, RPE and ExRx Reviewed MET's and goals. Pt tolerated exercise well with an average MET level of 1.8. Pt is doing well and progressing MET's. talked today about increasing SPM on the nustep and he increased his WL on the [facility] Bike. Overall doing well. Will go over home exercise soon. He has a memebership to [employer] and [employer] Reviewed MET's and goals. Pt tolerated exercise well with an average MET level of 2.4. Pt is doing well and progressing MET's and WL's. He's able to do some extra side work that he enjoys and feels he has more energy overall. He's been trying some youtube classes and plans to add in some tai chi and maybe some classes at the [facility]. Pt  graduated the [employer] today. Pt tolerated exercise well with an average MET level of 2.8. He feels good about his progress and increased his distance by 227ft to a total of 955ft on post . Pt will  continue to exercise on his own by going to PREP at [facility], [employer], variety of [employer] programs, [employer] [place] and some Youtube classes for 5-7 days for 30-60 mins per session.   .    Expected Outcomes Will continue to monitor pt and progress workloads as tolerated without sign or symptom Will continue to monitor pt and progress workloads as tolerated without sign or symptom Will continue to monitor pt and progress workloads as tolerated without sign or symptom Pt will continue to exercise on his own and gain strength       Nutrition & Weight - Outcomes:  Pre Biometrics - 10/11/23 1100       Pre Biometrics   Waist Circumference 51.25 inches    Hip Circumference 45 inches    Waist to Hip Ratio 1.14 %    Triceps Skinfold 25 mm    % Body Fat 31.7 %    Grip Strength 14 kg    Flexibility --   No performed; active low back pain 4/10   Single Leg Stand 1 seconds          Post Biometrics - 01/04/24 1703        Post  Biometrics   Height 6' 1.75 (1.873 m)    Weight 109 kg    Waist Circumference 47.5 inches    Hip Circumference 43 inches    Waist to Hip Ratio 1.1 %    BMI (Calculated) 31.07    Triceps Skinfold 25 mm    % Body Fat 34 %   Error on entry. %BF was 36% pre   Grip Strength 22 kg    Single Leg Stand 8 seconds          Nutrition:  Nutrition Therapy & Goals - 10/16/23 1046       Nutrition Therapy   Diet Heart healthy diet      Personal Nutrition Goals   Nutrition Goal Patient to identify strategies for reducing cardiovascular risk by attending the [facility] education and nutrition series weekly.    Personal Goal #2 Patient to improve diet quality by using the plate method as a guide for meal planning to include lean protein/plant protein, fruits, vegetables, whole grains, nonfat dairy as part of a well-balanced diet.    Comments Patient has medical history of CABGx3, DM2, PAF, hyperlipidemia, HTN, former smoker, COPD, NSTEMI. LDL is not at goal; he continues praluent  as he is statin intolerant. A1c is well controlled in a prediabetic range. Patient will benefit from participation in intensive cardiac rehab for nutrition education, exercise, and lifestyle modification.      Intervention Plan   Intervention Prescribe, educate and counsel regarding individualized specific dietary modifications aiming towards targeted core components such as weight, hypertension, lipid management, diabetes, heart failure and other comorbidities.;Nutrition handout(s) given to patient.    Expected Outcomes Short Term Goal: Understand basic principles of dietary content, such as calories, fat, sodium, cholesterol and nutrients.;Long Term Goal: Adherence to prescribed nutrition plan.          Nutrition Discharge:  Nutrition Assessments - 01/03/24 1523       Rate Your Plate Scores   Pre Score 64    Post Score 73  Education Questionnaire Score:  Knowledge Questionnaire Score - 12/31/23 1719       Knowledge Questionnaire Score   Post Score 18/24          Goals reviewed with patient; copy given to patient.Pt graduates from  Intensive/Traditional cardiac rehab program on 01/04/24  with completion of  69  exercise and education sessions. Pt maintained good attendance and progressed nicely during their participation in rehab as evidenced by increased MET level. Rick increased his distance on his post exercise walk test by 240 feet. Dick lost 2.1 kg while enrolled in the program.   Medication list reconciled. Repeat  PHQ score- 1 .  Pt has made significant lifestyle changes and should be commended for their success. Dick achieved his goals during cardiac rehab.   Pt plans to continue exercise at [employer] and [employer] we are proud of Rick's progress!Hadassah Elpidio Quan RN BSN

## 2024-01-08 ENCOUNTER — Telehealth: Payer: Self-pay

## 2024-01-08 NOTE — Telephone Encounter (Signed)
 [name] well start his initial assessment on 01/15/24 930 am at [employer] [facility].  The class will start 130 pm 01/29/24

## 2024-01-15 NOTE — Progress Notes (Signed)
 [facility]  Patient Details  Name: [name]. MRN: [id number] Date of Birth: [birth year] Age: 73 y.o. P[facility]  Vitals:   01/15/24 1317  BP: (!) 149/69  Pulse: (!) 53  SpO2: 98%  Weight: 242 lb 12.8 oz (110.1 kg)  Height: 6' 1 (1.854 m)     [facility] Eval - 01/15/24 1300       [facility] PRE[facility] Location   [facility]      Referral    Referring Provider Cooper    Reason for referral Hypertension;Obesitity/Overweight    Program Start Date 01/29/24      Measurement   Waist Circumference 50.5 inches    Hip Circumference 51 inches    Body fat 34.5 percent      Information for Trainer   Goals Balance, Strength    Current Exercise Silver sneaker    Orthopedic Concerns Left knee torn MCL    Current Barriers Drive trucks      Mobility and Daily Activities   I find it easy to walk up or down two or more flights of stairs. 1    I have no trouble taking out the trash. 3    I do housework such as vacuuming and dusting on my own without difficulty. 2    I can easily lift a gallon of milk (8lbs). 4    I can easily walk a mile. 1    I have no trouble reaching into high cupboards or reaching down to pick up something from the floor. 2    I do not have trouble doing out-door work such as loss adjuster, chartered, raking leaves, or gardening. 1      Mobility and Daily Activities   I feel younger than my age. 3    I feel independent. 4    I feel energetic. 2    I live an active life.  2    I feel strong. 3    I feel healthy. 2    I feel active as other people my age. 3      How fit and strong are you.   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   Neuromuscular disorder (HCC)    neuropathy   Non-toxic multinodular goiter 07/28/2023   Other psoriasis 07/28/2023   Paroxysmal atrial fibrillation (HCC) 09/22/2021   Peripheral vascular disease 2006   dvt 's   Personal history of colon polyps, unspecified 07/28/2023   Phlebitis and thrombophlebitis of lower extremities 07/28/2023   Aug 28, 2008 Entered By: POLLY SNELLEN Comment: h/o this in 2006     Pneumonia 2006   RBBB    S/P rotator cuff repair 05/20/2015   Sleep apnea    Subdural hematoma (HCC)  Past Surgical History:  Procedure Laterality Date   BRAIN SURGERY  2010   subdural hematoma -bleed6 months after heart cath   CARDIOVERSION N/A 08/20/2023   Procedure: CARDIOVERSION;  Surgeon: Pietro Redell RAMAN, MD;  Location: [facility] INVASIVE CV LAB;  Service: Cardiovascular;  Laterality: N/A;   CATARACT EXTRACTION, BILATERAL  12/16, 2017   cataract   CHOLECYSTECTOMY N/A 09/08/2020   Procedure: LAPAROSCOPIC CHOLECYSTECTOMY WITH INTRAOPERATIVE CHOLANGIOGRAM;  Surgeon: Belinda Cough, MD;  Location: [facility] ORS;  Service: General;  Laterality: N/A;   CLIPPING OF ATRIAL APPENDAGE Left 07/30/2023   Procedure: CLIPPING, LEFT ATRIAL APPENDAGE;  Surgeon: Kerrin Elspeth BROCKS, MD;  Location: [facility] OR;  Service: Open Heart Surgery;  Laterality: Left;   COLONOSCOPY  2014   CORONARY ARTERY BYPASS GRAFT N/A 07/30/2023   Procedure: CORONARY ARTERY BYPASS GRAFTING (CABG) TIMES THREE USING THE LEFT INTERNAL MAMMARY ARTERY AND ENDOSCOPICALLY HARVESTED RIGHT GREATER SAPHENOUS VEIN;  Surgeon: Kerrin Elspeth BROCKS, MD;  Location: [facility] OR;  Service: Open Heart Surgery;  Laterality: N/A;   CYSTOSCOPY WITH INSERTION OF UROLIFT     CYSTOSCOPY WITH INSERTION OF  UROLIFT     DG THUMB LEFT HAND Left    gamers    INTRAOPERATIVE TRANSESOPHAGEAL ECHOCARDIOGRAM N/A 07/30/2023   Procedure: ECHOCARDIOGRAM, TRANSESOPHAGEAL, INTRAOPERATIVE;  Surgeon: Kerrin Elspeth BROCKS, MD;  Location: [facility] OR;  Service: Open Heart Surgery;  Laterality: N/A;   IR THORACENTESIS ASP PLEURAL SPACE W/IMG GUIDE  08/22/2023   RIGHT/LEFT HEART CATH AND CORONARY ANGIOGRAPHY N/A 07/27/2023   Procedure: RIGHT/LEFT HEART CATH AND CORONARY ANGIOGRAPHY;  Surgeon: Wonda Sharper, MD;  Location: [facility] INVASIVE CV LAB;  Service: Cardiovascular;  Laterality: N/A;   ROTATOR CUFF REPAIR W/ DISTAL CLAVICLE EXCISION Left 05/20/2015   SHOULDER ARTHROSCOPY WITH SUBACROMIAL DECOMPRESSION, ROTATOR CUFF REPAIR AND BICEP TENDON REPAIR Left 05/20/2015   Procedure: LEFT SHOULDER ARTHROSCOPY WITH SUBACROMIAL DECOMPRESSION,DISTAL CLAVICAL RESECTION  ROTATOR CUFF REPAIR ;  Surgeon: Franky Pointer, MD;  Location: [facility] OR;  Service: Orthopedics;  Laterality: Left;   TONSILLECTOMY     TRANSESOPHAGEAL ECHOCARDIOGRAM (CATH LAB) N/A 08/20/2023   Procedure: TRANSESOPHAGEAL ECHOCARDIOGRAM;  Surgeon: Pietro Redell RAMAN, MD;  Location: [facility] INVASIVE CV LAB;  Service: Cardiovascular;  Laterality: N/A;   Social History   Tobacco Use  Smoking Status Former   Current packs/day: 0.00   Average packs/day: 1 pack/day for 15.0 years (15.0 ttl pk-yrs)   Types: Cigarettes   Start date: 59   Quit date: 1979   Years since quitting: 46.9  Smokeless Tobacco Never  Dick will be starting Prep Class on 01/29/2024 1:30 pm At the [facility] 01/15/2024, 1:45 PM

## 2024-01-29 NOTE — Progress Notes (Signed)
 [facility] Weekly Session  Patient Details  Name: [name]. MRN: [id number] Date of Birth: 1950/09/07 Age: 73 y.o. P[facility]: [facility]  There were no vitals filed for this visit.    [name] 01/29/2024, 3:07 PM

## 2024-02-05 NOTE — Progress Notes (Signed)
 [facility] Weekly Session  Patient Details  Name: [name]. MRN: [id number] Date of Birth: 1950/09/16 Age: 73 y.o. P[facility]: [facility]  Vitals:   02/05/24 1519  Weight: 240 lb (108.9 kg)      [name] 02/05/2024, 3:23 PM

## 2024-02-19 NOTE — Progress Notes (Signed)
 [facility] Weekly Session  Patient Details  Name: [name]. MRN: [id number] Date of Birth: [birth year] Age: 73 y.o. PCP: [facility]  Vitals:   02/19/24 1435  Weight: 239 lb (108.4 kg)     [facility] Weekly seesion - 02/19/24 1400       [facility] PREP Location   [facility] Location [facility]      Weekly Session   Topic Discussed Health habits   Healthy habits, water, and sugar demo   Minutes exercised this week 150 minutes    Classes attended to date 6          Suzen Ash 02/19/2024, 2:36 PM

## 2024-02-26 NOTE — Progress Notes (Signed)
 [facility] PRE[facility] Weekly Session  Patient Details  Name: [name]. MRN: [id number] Date of Birth: August 02, 1950 Age: 73 y.o. PCP: [facility]  Vitals:   02/26/24 1452  Weight: 239 lb (108.4 kg)     [facility] Weekly seesion - 02/26/24 1400       [facility] PREP Location   [facility] Location [facility]      Weekly Session   Topic Discussed Restaurant Eating   Tip on eating out for better reults and Salt Demo   Minutes exercised this week 229 minutes    Classes attended to date 7          [name] 02/26/2024, 2:55 PM

## 2024-03-04 NOTE — Progress Notes (Signed)
 [facility] Weekly Session  Patient Details  Name: [name]. MRN: [id number] Date of Birth: May 12, 1950 Age: 74 y.o. PCP: [facility]  Vitals:   03/04/24 1535  Weight: 238 lb (108 kg)     [facility] Weekly seesion - 03/04/24 1500       [facility] PREP Location   [facility] Location [employer]      Weekly Session   Topic Discussed Stress management and problem solving   Sleep regimen mindfulness, meditation and stress on the body and reducing stress   Minutes exercised this week 480 minutes    Classes attended to date 8          [name] 03/04/2024, 3:38 PM

## 2024-03-18 NOTE — Progress Notes (Signed)
 [facility] PRE[facility] Weekly Session  Patient Details  Name: [name]. MRN: [id number] Date of Birth: 1950-07-01 Age: 74 y.o. PCP: [facility]  Vitals:   03/18/24 1447  Weight: 244 lb (110.7 kg)     [facility] Weekly seesion - 03/18/24 1400       [facility] PREP Location   [facility] PREP Location [facility]      Weekly Session   Topic Discussed Other   Macronutrients: Crabs, Proteins and fats. Showed the class apps that track Macro's.   Minutes exercised this week 430 minutes    Classes attended to date 12          [name] 03/18/2024, 2:48 PM
